# Patient Record
Sex: Male | Born: 1985 | Race: White | Hispanic: No | Marital: Single | Smoking: Current every day smoker
Health system: Southern US, Community
[De-identification: ages and names within clinical notes are randomized; demographics above are authoritative.]

## PROBLEM LIST (undated history)

## (undated) DIAGNOSIS — T50901A Poisoning by unspecified drugs, medicaments and biological substances, accidental (unintentional), initial encounter: Secondary | ICD-10-CM

## (undated) DIAGNOSIS — B1920 Unspecified viral hepatitis C without hepatic coma: Secondary | ICD-10-CM

## (undated) DIAGNOSIS — I82409 Acute embolism and thrombosis of unspecified deep veins of unspecified lower extremity: Secondary | ICD-10-CM

## (undated) HISTORY — PX: DENTAL SURGERY: SHX609

---

## 2007-06-02 ENCOUNTER — Emergency Department (HOSPITAL_COMMUNITY): Admission: EM | Admit: 2007-06-02 | Discharge: 2007-06-02 | Payer: Self-pay | Admitting: *Deleted

## 2007-12-25 ENCOUNTER — Emergency Department (HOSPITAL_COMMUNITY): Admission: EM | Admit: 2007-12-25 | Discharge: 2007-12-25 | Payer: Self-pay | Admitting: Emergency Medicine

## 2009-02-28 ENCOUNTER — Emergency Department (HOSPITAL_COMMUNITY): Admission: EM | Admit: 2009-02-28 | Discharge: 2009-03-01 | Payer: Self-pay | Admitting: Emergency Medicine

## 2010-10-19 ENCOUNTER — Emergency Department (HOSPITAL_COMMUNITY)
Admission: EM | Admit: 2010-10-19 | Discharge: 2010-10-19 | Payer: Self-pay | Source: Home / Self Care | Admitting: Emergency Medicine

## 2010-10-23 ENCOUNTER — Emergency Department (HOSPITAL_COMMUNITY)
Admission: EM | Admit: 2010-10-23 | Discharge: 2010-10-23 | Payer: Self-pay | Source: Home / Self Care | Admitting: Emergency Medicine

## 2010-10-23 ENCOUNTER — Emergency Department: Payer: Self-pay | Admitting: Emergency Medicine

## 2011-02-08 LAB — DIFFERENTIAL
Basophils Absolute: 0 10*3/uL (ref 0.0–0.1)
Basophils Relative: 0 % (ref 0–1)
Eosinophils Absolute: 0.2 10*3/uL (ref 0.0–0.7)
Eosinophils Relative: 2 % (ref 0–5)
Lymphocytes Relative: 31 % (ref 12–46)
Lymphs Abs: 2.5 10*3/uL (ref 0.7–4.0)
Monocytes Relative: 10 % (ref 3–12)
Myelocytes: 0 %
Neutro Abs: 4.7 10*3/uL (ref 1.7–7.7)
Neutrophils Relative %: 57 % (ref 43–77)
nRBC: 0 /100 WBC

## 2011-02-08 LAB — POCT I-STAT, CHEM 8
Calcium, Ion: 1.09 mmol/L — ABNORMAL LOW (ref 1.12–1.32)
Creatinine, Ser: 1.1 mg/dL (ref 0.4–1.5)
Glucose, Bld: 91 mg/dL (ref 70–99)
HCT: 48 % (ref 39.0–52.0)
Hemoglobin: 16.3 g/dL (ref 13.0–17.0)
Potassium: 3.7 mEq/L (ref 3.5–5.1)

## 2011-02-08 LAB — CBC
MCV: 91.9 fL (ref 78.0–100.0)
Platelets: 155 10*3/uL (ref 150–400)
RBC: 4.84 MIL/uL (ref 4.22–5.81)
WBC: 8.2 10*3/uL (ref 4.0–10.5)

## 2011-02-08 LAB — URINALYSIS, ROUTINE W REFLEX MICROSCOPIC
Hgb urine dipstick: NEGATIVE
Nitrite: NEGATIVE
Protein, ur: NEGATIVE mg/dL
Specific Gravity, Urine: 1.03 (ref 1.005–1.030)
Urobilinogen, UA: 1 mg/dL (ref 0.0–1.0)

## 2011-06-12 ENCOUNTER — Emergency Department (HOSPITAL_COMMUNITY): Payer: Self-pay

## 2011-06-12 ENCOUNTER — Emergency Department (HOSPITAL_COMMUNITY)
Admission: EM | Admit: 2011-06-12 | Discharge: 2011-06-12 | Disposition: A | Discharge status: Home / Self Care | Payer: Self-pay | Attending: Emergency Medicine | Admitting: Emergency Medicine

## 2011-06-12 DIAGNOSIS — S40029A Contusion of unspecified upper arm, initial encounter: Secondary | ICD-10-CM | POA: Insufficient documentation

## 2011-06-12 DIAGNOSIS — H53149 Visual discomfort, unspecified: Secondary | ICD-10-CM | POA: Insufficient documentation

## 2011-06-12 DIAGNOSIS — S40019A Contusion of unspecified shoulder, initial encounter: Secondary | ICD-10-CM | POA: Insufficient documentation

## 2011-06-12 DIAGNOSIS — IMO0002 Reserved for concepts with insufficient information to code with codable children: Secondary | ICD-10-CM | POA: Insufficient documentation

## 2011-06-12 DIAGNOSIS — S060XAA Concussion with loss of consciousness status unknown, initial encounter: Secondary | ICD-10-CM | POA: Insufficient documentation

## 2011-06-12 DIAGNOSIS — F17200 Nicotine dependence, unspecified, uncomplicated: Secondary | ICD-10-CM | POA: Insufficient documentation

## 2011-06-12 DIAGNOSIS — T07XXXA Unspecified multiple injuries, initial encounter: Secondary | ICD-10-CM

## 2011-06-12 DIAGNOSIS — S060X9A Concussion with loss of consciousness of unspecified duration, initial encounter: Secondary | ICD-10-CM

## 2011-06-12 MED ORDER — OXYCODONE-ACETAMINOPHEN 5-325 MG PO TABS: 2.0000 | ORAL_TABLET | ORAL | Status: AC | PRN

## 2011-06-12 NOTE — ED Notes (Signed)
MD at bedside. Dr Judd Lien discussed discharge with pt.
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
## 2011-07-05 ENCOUNTER — Emergency Department: Payer: Self-pay | Admitting: Emergency Medicine

## 2011-11-03 ENCOUNTER — Emergency Department: Payer: Self-pay | Admitting: Internal Medicine

## 2012-05-10 ENCOUNTER — Ambulatory Visit: Payer: Self-pay | Admitting: Family Medicine

## 2013-04-04 ENCOUNTER — Encounter (HOSPITAL_COMMUNITY): Payer: Self-pay | Admitting: *Deleted

## 2013-04-04 ENCOUNTER — Emergency Department (HOSPITAL_COMMUNITY)
Admission: EM | Admit: 2013-04-04 | Discharge: 2013-04-04 | Disposition: A | Discharge status: Home / Self Care | Payer: Self-pay | Attending: Emergency Medicine | Admitting: Emergency Medicine

## 2013-04-04 DIAGNOSIS — K0889 Other specified disorders of teeth and supporting structures: Secondary | ICD-10-CM

## 2013-04-04 DIAGNOSIS — F17200 Nicotine dependence, unspecified, uncomplicated: Secondary | ICD-10-CM | POA: Insufficient documentation

## 2013-04-04 DIAGNOSIS — K089 Disorder of teeth and supporting structures, unspecified: Secondary | ICD-10-CM | POA: Insufficient documentation

## 2013-04-04 DIAGNOSIS — K029 Dental caries, unspecified: Secondary | ICD-10-CM | POA: Insufficient documentation

## 2013-04-04 MED ORDER — AMOXICILLIN 500 MG PO CAPS: 500.0000 mg | ORAL_CAPSULE | Freq: Three times a day (TID) | ORAL | Status: DC

## 2013-04-04 MED ORDER — TRAMADOL HCL 50 MG PO TABS: 50.0000 mg | ORAL_TABLET | Freq: Four times a day (QID) | ORAL | Status: DC | PRN

## 2013-04-04 MED ORDER — TRAMADOL HCL 50 MG PO TABS
50.0000 mg | ORAL_TABLET | Freq: Once | ORAL | Status: AC
  Administered 2013-04-04: 50 mg via ORAL
  Filled 2013-04-04: qty 1

## 2013-04-04 MED ORDER — AMOXICILLIN 250 MG PO CAPS
500.0000 mg | ORAL_CAPSULE | Freq: Once | ORAL | Status: AC
  Administered 2013-04-04: 500 mg via ORAL
  Filled 2013-04-04: qty 2

## 2013-04-04 NOTE — ED Notes (Signed)
Pt alert & oriented x4, stable gait. Patient given discharge instructions, paperwork & prescription(s). Patient  instructed to stop at the registration desk to finish any additional paperwork. Patient verbalized understanding. Pt left department w/ no further questions. 

## 2013-04-04 NOTE — ED Provider Notes (Signed)
History     CSN: [id number]  Arrival date & time 04/04/13  2035   First MD Initiated Contact with Patient 04/04/13 2052      Chief Complaint  Patient presents with  . Dental Pain    (Consider location/radiation/quality/duration/timing/severity/associated sxs/prior treatment) HPI Comments: Edwin [name] is a 27 y.o. male who presents to the Emergency Department complaining of dental pain.  States he has a history of dental decay, but reports worsening pain to his right upper teeth for several days.  States he noticed a" bump" to his right upper gum earlier this week. He denies bleeding of the gums, difficulty swallowing or breathing, neck pain, facial swelling or fever. He states pain has been mildly controlled with ibuprofen  Patient is a 27 y.o. male presenting with tooth pain.  Dental Pain Associated symptoms: no congestion, no facial swelling, no fever, no headaches and no neck pain     History reviewed. No pertinent past medical history.  Past Surgical History  Procedure Laterality Date  . Dental surgery      No family history on file.  History  Substance Use Topics  . Smoking status: Current Every Day Smoker  . Smokeless tobacco: Not on file  . Alcohol Use: Yes      Review of Systems  Constitutional: Negative for fever and appetite change.  HENT: Positive for dental problem. Negative for congestion, sore throat, facial swelling, trouble swallowing, neck pain and neck stiffness.   Eyes: Negative for pain and visual disturbance.  Neurological: Negative for dizziness, facial asymmetry and headaches.  Hematological: Negative for adenopathy.  All other systems reviewed and are negative.    Allergies  Vicodin  Home Medications   Current Outpatient Rx  Name  Route  Sig  Dispense  Refill  . ibuprofen (ADVIL,MOTRIN) 200 MG tablet   Oral   Take 800 mg by mouth every 6 (six) hours as needed. pain            BP 141/88  Pulse 94  Temp(Src) 98.6 F (37 C)  (Oral)  Ht 5\' 8"  (1.727 m)  Wt 200 lb (90.719 kg)  BMI 30.42 kg/m2  SpO2 98%  Physical Exam  Nursing note and vitals reviewed. Constitutional: He is oriented to person, place, and time. He appears well-developed and well-nourished. No distress.  HENT:  Head: Normocephalic and atraumatic. No trismus in the jaw.  Right Ear: Tympanic membrane and ear canal normal.  Left Ear: Tympanic membrane and ear canal normal.  Mouth/Throat: Uvula is midline, oropharynx is clear and moist and mucous membranes are normal. Dental caries present. No dental abscesses or edematous.    Dental decay of the right upper bicuspid with a small erythematous papule to the surrounding gums. No drainage, facial edema, trismus, or sublingual abnormalities.  Neck: Normal range of motion. Neck supple.  Cardiovascular: Normal rate, regular rhythm and normal heart sounds.   No murmur heard. Pulmonary/Chest: Effort normal and breath sounds normal. No respiratory distress.  Musculoskeletal: Normal range of motion.  Lymphadenopathy:    He has no cervical adenopathy.  Neurological: He is alert and oriented to person, place, and time. He exhibits normal muscle tone. Coordination normal.  Skin: Skin is warm and dry.    ED Course  Procedures (including critical care time)  Labs Reviewed - No data to display No results found.      MDM    Vital signs are stable, patient is alert nontoxic appearing. Airway is patent. No clinical signs to  suggest Ludwig angina  Patient agrees to close followup with a dentist  Edwin [name] L. Trisha Mangle, PA-C 04/04/13 2106

## 2013-04-04 NOTE — ED Notes (Signed)
Pt states has a tooth on the upper right been hurting for a long time. Has become worse.

## 2013-04-05 NOTE — ED Provider Notes (Signed)
Medical screening examination/treatment/procedure(s) were performed by non-physician practitioner and as supervising physician I was immediately available for consultation/collaboration.    Iosefa Weintraub M Velinda Wrobel, DO 04/05/13 1452 

## 2014-01-12 ENCOUNTER — Emergency Department: Payer: Self-pay | Admitting: Emergency Medicine

## 2014-01-12 LAB — COMPREHENSIVE METABOLIC PANEL
ALK PHOS: 57 U/L
ALT: 40 U/L (ref 12–78)
ANION GAP: 7 (ref 7–16)
Albumin: 4.5 g/dL (ref 3.4–5.0)
BILIRUBIN TOTAL: 0.3 mg/dL (ref 0.2–1.0)
BUN: 18 mg/dL (ref 7–18)
CALCIUM: 8.4 mg/dL — AB (ref 8.5–10.1)
Chloride: 105 mmol/L (ref 98–107)
Co2: 25 mmol/L (ref 21–32)
Creatinine: 1.23 mg/dL (ref 0.60–1.30)
EGFR (Non-African Amer.): >60
Glucose: 139 mg/dL — ABNORMAL HIGH (ref 65–99)
OSMOLALITY: 278 (ref 275–301)
Potassium: 3.2 mmol/L — ABNORMAL LOW (ref 3.5–5.1)
SGOT(AST): 27 U/L (ref 15–37)
Sodium: 137 mmol/L (ref 136–145)
TOTAL PROTEIN: 8 g/dL (ref 6.4–8.2)

## 2014-01-12 LAB — URINALYSIS, COMPLETE
Bacteria: NONE SEEN
Bilirubin,UR: NEGATIVE
GLUCOSE, UR: NEGATIVE mg/dL (ref 0–75)
Ketone: NEGATIVE
Nitrite: NEGATIVE
PH: 5 (ref 4.5–8.0)
PROTEIN: NEGATIVE
Specific Gravity: 1.004 (ref 1.003–1.030)
Squamous Epithelial: NONE SEEN
WBC UR: 1 /HPF (ref 0–5)

## 2014-01-12 LAB — DRUG SCREEN, URINE
AMPHETAMINES, UR SCREEN: NEGATIVE (ref ?–1000)
BARBITURATES, UR SCREEN: NEGATIVE (ref ?–200)
Benzodiazepine, Ur Scrn: NEGATIVE (ref ?–200)
CANNABINOID 50 NG, UR ~~LOC~~: NEGATIVE (ref ?–50)
Cocaine Metabolite,Ur ~~LOC~~: POSITIVE (ref ?–300)
MDMA (Ecstasy)Ur Screen: NEGATIVE (ref ?–500)
METHADONE, UR SCREEN: NEGATIVE (ref ?–300)
OPIATE, UR SCREEN: POSITIVE (ref ?–300)
PHENCYCLIDINE (PCP) UR S: NEGATIVE (ref ?–25)
Tricyclic, Ur Screen: NEGATIVE (ref ?–1000)

## 2014-01-12 LAB — CBC
HCT: 43.9 % (ref 40.0–52.0)
HGB: 15.4 g/dL (ref 13.0–18.0)
MCH: 32.3 pg (ref 26.0–34.0)
MCHC: 35.2 g/dL (ref 32.0–36.0)
MCV: 92 fL (ref 80–100)
Platelet: 297 10*3/uL (ref 150–440)
RBC: 4.78 10*6/uL (ref 4.40–5.90)
RDW: 13.2 % (ref 11.5–14.5)
WBC: 16.5 10*3/uL — AB (ref 3.8–10.6)

## 2014-01-12 LAB — ETHANOL
ETHANOL LVL: 92 mg/dL
Ethanol %: 0.092 % — ABNORMAL HIGH (ref 0.000–0.080)

## 2014-01-21 ENCOUNTER — Other Ambulatory Visit: Payer: Self-pay

## 2014-01-21 ENCOUNTER — Inpatient Hospital Stay: Payer: Self-pay | Admitting: Internal Medicine

## 2014-01-21 LAB — TROPONIN I: Troponin-I: <0.02 ng/mL

## 2014-01-21 LAB — DRUG SCREEN, URINE
AMPHETAMINES, UR SCREEN: NEGATIVE (ref ?–1000)
BARBITURATES, UR SCREEN: NEGATIVE (ref ?–200)
Benzodiazepine, Ur Scrn: POSITIVE (ref ?–200)
COCAINE METABOLITE, UR ~~LOC~~: NEGATIVE (ref ?–300)
Cannabinoid 50 Ng, Ur ~~LOC~~: NEGATIVE (ref ?–50)
MDMA (Ecstasy)Ur Screen: NEGATIVE (ref ?–500)
METHADONE, UR SCREEN: NEGATIVE (ref ?–300)
Opiate, Ur Screen: POSITIVE (ref ?–300)
PHENCYCLIDINE (PCP) UR S: NEGATIVE (ref ?–25)
TRICYCLIC, UR SCREEN: NEGATIVE (ref ?–1000)

## 2014-01-21 LAB — COMPREHENSIVE METABOLIC PANEL
ALT: 29 U/L (ref 12–78)
AST: 22 U/L (ref 15–37)
Albumin: 3.8 g/dL (ref 3.4–5.0)
Alkaline Phosphatase: 55 U/L
Anion Gap: 15 (ref 7–16)
BUN: 19 mg/dL — AB (ref 7–18)
Bilirubin,Total: 0.2 mg/dL (ref 0.2–1.0)
CHLORIDE: 101 mmol/L (ref 98–107)
CREATININE: 1.54 mg/dL — AB (ref 0.60–1.30)
Calcium, Total: 7.9 mg/dL — ABNORMAL LOW (ref 8.5–10.1)
Co2: 19 mmol/L — ABNORMAL LOW (ref 21–32)
EGFR (African American): >60
Glucose: 343 mg/dL — ABNORMAL HIGH (ref 65–99)
Osmolality: 286 (ref 275–301)
Potassium: 3.6 mmol/L (ref 3.5–5.1)
SODIUM: 135 mmol/L — AB (ref 136–145)
Total Protein: 7 g/dL (ref 6.4–8.2)

## 2014-01-21 LAB — URINALYSIS, COMPLETE
BACTERIA: NONE SEEN
BILIRUBIN, UR: NEGATIVE
Blood: NEGATIVE
Glucose,UR: >=500 mg/dL (ref 0–75)
Ketone: NEGATIVE
Leukocyte Esterase: NEGATIVE
NITRITE: NEGATIVE
PROTEIN: NEGATIVE
Ph: 5 (ref 4.5–8.0)
Specific Gravity: 1.015 (ref 1.003–1.030)
Squamous Epithelial: NONE SEEN

## 2014-01-21 LAB — CBC
HCT: 41.8 % (ref 40.0–52.0)
HGB: 13.7 g/dL (ref 13.0–18.0)
MCH: 31.6 pg (ref 26.0–34.0)
MCHC: 32.9 g/dL (ref 32.0–36.0)
MCV: 96 fL (ref 80–100)
PLATELETS: 266 10*3/uL (ref 150–440)
RBC: 4.35 10*6/uL — ABNORMAL LOW (ref 4.40–5.90)
RDW: 13.4 % (ref 11.5–14.5)
WBC: 17 10*3/uL — ABNORMAL HIGH (ref 3.8–10.6)

## 2014-01-21 LAB — BASIC METABOLIC PANEL
ANION GAP: 6 — AB (ref 7–16)
BUN: 19 mg/dL — AB (ref 7–18)
CALCIUM: 8 mg/dL — AB (ref 8.5–10.1)
CREATININE: 1.09 mg/dL (ref 0.60–1.30)
Chloride: 106 mmol/L (ref 98–107)
Co2: 26 mmol/L (ref 21–32)
GLUCOSE: 83 mg/dL (ref 65–99)
Osmolality: 277 (ref 275–301)
Potassium: 4.5 mmol/L (ref 3.5–5.1)
SODIUM: 138 mmol/L (ref 136–145)

## 2014-01-21 LAB — ETHANOL
Ethanol %: <0.003 % (ref 0.000–0.080)
Ethanol: <3 mg/dL

## 2014-01-21 LAB — MAGNESIUM: Magnesium: 1.9 mg/dL

## 2014-01-21 LAB — HEMOGLOBIN A1C: Hemoglobin A1C: 5.8 % (ref 4.2–6.3)

## 2014-01-22 LAB — CBC WITH DIFFERENTIAL/PLATELET
Basophil #: 0.1 10*3/uL (ref 0.0–0.1)
Basophil %: 0.4 %
Eosinophil #: 0.3 10*3/uL (ref 0.0–0.7)
Eosinophil %: 2.1 %
HCT: 35.6 % — ABNORMAL LOW (ref 40.0–52.0)
HGB: 12 g/dL — AB (ref 13.0–18.0)
LYMPHS ABS: 2.8 10*3/uL (ref 1.0–3.6)
LYMPHS PCT: 18.3 %
MCH: 31.7 pg (ref 26.0–34.0)
MCHC: 33.7 g/dL (ref 32.0–36.0)
MCV: 94 fL (ref 80–100)
MONO ABS: 1.5 x10 3/mm — AB (ref 0.2–1.0)
Monocyte %: 9.6 %
NEUTROS PCT: 69.6 %
Neutrophil #: 10.6 10*3/uL — ABNORMAL HIGH (ref 1.4–6.5)
PLATELETS: 162 10*3/uL (ref 150–440)
RBC: 3.79 10*6/uL — AB (ref 4.40–5.90)
RDW: 13.5 % (ref 11.5–14.5)
WBC: 15.3 10*3/uL — AB (ref 3.8–10.6)

## 2014-01-22 LAB — COMPREHENSIVE METABOLIC PANEL
ALK PHOS: 40 U/L — AB
ALT: 24 U/L (ref 12–78)
Albumin: 2.7 g/dL — ABNORMAL LOW (ref 3.4–5.0)
Anion Gap: 7 (ref 7–16)
BILIRUBIN TOTAL: 0.4 mg/dL (ref 0.2–1.0)
BUN: 27 mg/dL — ABNORMAL HIGH (ref 7–18)
CALCIUM: 7.5 mg/dL — AB (ref 8.5–10.1)
Chloride: 104 mmol/L (ref 98–107)
Co2: 25 mmol/L (ref 21–32)
Creatinine: 2 mg/dL — ABNORMAL HIGH (ref 0.60–1.30)
EGFR (African American): 51 — ABNORMAL LOW
GFR CALC NON AF AMER: 44 — AB
GLUCOSE: 99 mg/dL (ref 65–99)
Osmolality: 277 (ref 275–301)
POTASSIUM: 4.7 mmol/L (ref 3.5–5.1)
SGOT(AST): 39 U/L — ABNORMAL HIGH (ref 15–37)
SODIUM: 136 mmol/L (ref 136–145)
TOTAL PROTEIN: 5.8 g/dL — AB (ref 6.4–8.2)

## 2014-01-22 LAB — MAGNESIUM: MAGNESIUM: 1.8 mg/dL

## 2014-01-23 LAB — BASIC METABOLIC PANEL
ANION GAP: 6 — AB (ref 7–16)
BUN: 11 mg/dL (ref 7–18)
CALCIUM: 7.9 mg/dL — AB (ref 8.5–10.1)
Chloride: 108 mmol/L — ABNORMAL HIGH (ref 98–107)
Co2: 25 mmol/L (ref 21–32)
Creatinine: 1.06 mg/dL (ref 0.60–1.30)
EGFR (African American): >60
EGFR (Non-African Amer.): >60
Glucose: 87 mg/dL (ref 65–99)
OSMOLALITY: 276 (ref 275–301)
POTASSIUM: 3.5 mmol/L (ref 3.5–5.1)
SODIUM: 139 mmol/L (ref 136–145)

## 2014-01-23 LAB — PHOSPHORUS: PHOSPHORUS: 2.5 mg/dL (ref 2.5–4.9)

## 2014-01-24 LAB — TRIGLYCERIDES: TRIGLYCERIDES: 443 mg/dL — AB (ref 0–200)

## 2014-01-25 LAB — BASIC METABOLIC PANEL
Anion Gap: 3 — ABNORMAL LOW (ref 7–16)
BUN: 10 mg/dL (ref 7–18)
CALCIUM: 8 mg/dL — AB (ref 8.5–10.1)
CO2: 29 mmol/L (ref 21–32)
Chloride: 111 mmol/L — ABNORMAL HIGH (ref 98–107)
Creatinine: 0.9 mg/dL (ref 0.60–1.30)
EGFR (African American): >60
EGFR (Non-African Amer.): >60
GLUCOSE: 89 mg/dL (ref 65–99)
OSMOLALITY: 283 (ref 275–301)
POTASSIUM: 3.6 mmol/L (ref 3.5–5.1)
SODIUM: 143 mmol/L (ref 136–145)

## 2014-01-25 LAB — BRONCHIAL WASH CULTURE

## 2014-01-25 LAB — AMMONIA: Ammonia, Plasma: 32 mcmol/L (ref 11–32)

## 2014-01-25 LAB — MAGNESIUM: Magnesium: 1.9 mg/dL

## 2014-01-25 LAB — PHOSPHORUS: PHOSPHORUS: 3.3 mg/dL (ref 2.5–4.9)

## 2014-01-26 LAB — COMPREHENSIVE METABOLIC PANEL
ALT: 42 U/L (ref 12–78)
ANION GAP: 7 (ref 7–16)
AST: 41 U/L — AB (ref 15–37)
Albumin: 1.9 g/dL — ABNORMAL LOW (ref 3.4–5.0)
Alkaline Phosphatase: 85 U/L
BUN: 12 mg/dL (ref 7–18)
Bilirubin,Total: 0.4 mg/dL (ref 0.2–1.0)
CO2: 26 mmol/L (ref 21–32)
Calcium, Total: 7.8 mg/dL — ABNORMAL LOW (ref 8.5–10.1)
Chloride: 111 mmol/L — ABNORMAL HIGH (ref 98–107)
Creatinine: 0.97 mg/dL (ref 0.60–1.30)
EGFR (Non-African Amer.): >60
Glucose: 109 mg/dL — ABNORMAL HIGH (ref 65–99)
OSMOLALITY: 287 (ref 275–301)
Potassium: 3.4 mmol/L — ABNORMAL LOW (ref 3.5–5.1)
Sodium: 144 mmol/L (ref 136–145)
TOTAL PROTEIN: 5.9 g/dL — AB (ref 6.4–8.2)

## 2014-01-26 LAB — CBC WITH DIFFERENTIAL/PLATELET
Basophil #: 0.1 10*3/uL (ref 0.0–0.1)
Basophil %: 0.7 %
Eosinophil #: 0.5 10*3/uL (ref 0.0–0.7)
Eosinophil %: 6.7 %
HCT: 28 % — AB (ref 40.0–52.0)
HGB: 9.5 g/dL — ABNORMAL LOW (ref 13.0–18.0)
Lymphocyte #: 1.7 10*3/uL (ref 1.0–3.6)
Lymphocyte %: 23 %
MCH: 31.4 pg (ref 26.0–34.0)
MCHC: 33.9 g/dL (ref 32.0–36.0)
MCV: 93 fL (ref 80–100)
Monocyte #: 1.2 x10 3/mm — ABNORMAL HIGH (ref 0.2–1.0)
Monocyte %: 15.7 %
Neutrophil #: 4 10*3/uL (ref 1.4–6.5)
Neutrophil %: 53.9 %
PLATELETS: 191 10*3/uL (ref 150–440)
RBC: 3.03 10*6/uL — AB (ref 4.40–5.90)
RDW: 13.6 % (ref 11.5–14.5)
WBC: 7.4 10*3/uL (ref 3.8–10.6)

## 2014-01-26 LAB — VANCOMYCIN, TROUGH: Vancomycin, Trough: 16 ug/mL (ref 10–20)

## 2014-01-26 LAB — PHOSPHORUS: Phosphorus: 4.4 mg/dL (ref 2.5–4.9)

## 2014-01-26 LAB — CULTURE, BLOOD (SINGLE)

## 2014-01-26 LAB — POTASSIUM: Potassium: 3.9 mmol/L (ref 3.5–5.1)

## 2014-01-26 LAB — MAGNESIUM: Magnesium: 1.8 mg/dL

## 2014-01-27 LAB — BASIC METABOLIC PANEL
Anion Gap: 3 — ABNORMAL LOW (ref 7–16)
BUN: 13 mg/dL (ref 7–18)
CREATININE: 0.95 mg/dL (ref 0.60–1.30)
Calcium, Total: 7.9 mg/dL — ABNORMAL LOW (ref 8.5–10.1)
Chloride: 109 mmol/L — ABNORMAL HIGH (ref 98–107)
Co2: 30 mmol/L (ref 21–32)
EGFR (Non-African Amer.): >60
Glucose: 105 mg/dL — ABNORMAL HIGH (ref 65–99)
OSMOLALITY: 284 (ref 275–301)
POTASSIUM: 3.8 mmol/L (ref 3.5–5.1)
SODIUM: 142 mmol/L (ref 136–145)

## 2014-01-27 LAB — MAGNESIUM: MAGNESIUM: 1.7 mg/dL — AB

## 2014-01-27 LAB — PHOSPHORUS: PHOSPHORUS: 4.3 mg/dL (ref 2.5–4.9)

## 2014-01-28 LAB — BASIC METABOLIC PANEL
Anion Gap: 1 — ABNORMAL LOW (ref 7–16)
BUN: 17 mg/dL (ref 7–18)
CO2: 32 mmol/L (ref 21–32)
Calcium, Total: 8 mg/dL — ABNORMAL LOW (ref 8.5–10.1)
Chloride: 107 mmol/L (ref 98–107)
Creatinine: 0.79 mg/dL (ref 0.60–1.30)
EGFR (Non-African Amer.): >60
GLUCOSE: 145 mg/dL — AB (ref 65–99)
Osmolality: 284 (ref 275–301)
POTASSIUM: 4.4 mmol/L (ref 3.5–5.1)
Sodium: 140 mmol/L (ref 136–145)

## 2014-01-28 LAB — MAGNESIUM: Magnesium: 2.1 mg/dL

## 2014-01-28 LAB — PHOSPHORUS: Phosphorus: 3.5 mg/dL (ref 2.5–4.9)

## 2014-01-28 LAB — TRIGLYCERIDES: Triglycerides: 186 mg/dL (ref 0–200)

## 2014-01-29 LAB — PHOSPHORUS: Phosphorus: 2.9 mg/dL (ref 2.5–4.9)

## 2014-01-29 LAB — CBC WITH DIFFERENTIAL/PLATELET
BASOS ABS: 0.1 10*3/uL (ref 0.0–0.1)
Basophil %: 0.5 %
EOS PCT: 0.2 %
Eosinophil #: 0 10*3/uL (ref 0.0–0.7)
HCT: 29.9 % — ABNORMAL LOW (ref 40.0–52.0)
HGB: 10 g/dL — AB (ref 13.0–18.0)
LYMPHS ABS: 1.9 10*3/uL (ref 1.0–3.6)
LYMPHS PCT: 16.5 %
MCH: 30.8 pg (ref 26.0–34.0)
MCHC: 33.3 g/dL (ref 32.0–36.0)
MCV: 92 fL (ref 80–100)
MONO ABS: 1.6 x10 3/mm — AB (ref 0.2–1.0)
MONOS PCT: 13.9 %
Neutrophil #: 7.8 10*3/uL — ABNORMAL HIGH (ref 1.4–6.5)
Neutrophil %: 68.9 %
PLATELETS: 410 10*3/uL (ref 150–440)
RBC: 3.23 10*6/uL — ABNORMAL LOW (ref 4.40–5.90)
RDW: 13.5 % (ref 11.5–14.5)
WBC: 11.3 10*3/uL — AB (ref 3.8–10.6)

## 2014-01-29 LAB — BASIC METABOLIC PANEL
Anion Gap: 2 — ABNORMAL LOW (ref 7–16)
BUN: 24 mg/dL — ABNORMAL HIGH (ref 7–18)
CALCIUM: 8.1 mg/dL — AB (ref 8.5–10.1)
Chloride: 107 mmol/L (ref 98–107)
Co2: 32 mmol/L (ref 21–32)
Creatinine: 0.73 mg/dL (ref 0.60–1.30)
EGFR (African American): >60
GLUCOSE: 147 mg/dL — AB (ref 65–99)
Osmolality: 288 (ref 275–301)
Potassium: 4.4 mmol/L (ref 3.5–5.1)
Sodium: 141 mmol/L (ref 136–145)

## 2014-01-29 LAB — MAGNESIUM: Magnesium: 2.1 mg/dL

## 2014-01-30 LAB — CBC WITH DIFFERENTIAL/PLATELET
BASOS PCT: 1 %
Basophil #: 0.1 10*3/uL (ref 0.0–0.1)
EOS ABS: 0.1 10*3/uL (ref 0.0–0.7)
EOS PCT: 0.6 %
HCT: 32.5 % — ABNORMAL LOW (ref 40.0–52.0)
HGB: 10.7 g/dL — AB (ref 13.0–18.0)
LYMPHS PCT: 21.5 %
Lymphocyte #: 2.9 10*3/uL (ref 1.0–3.6)
MCH: 30.7 pg (ref 26.0–34.0)
MCHC: 32.9 g/dL (ref 32.0–36.0)
MCV: 93 fL (ref 80–100)
Monocyte #: 1.9 x10 3/mm — ABNORMAL HIGH (ref 0.2–1.0)
Monocyte %: 13.8 %
NEUTROS PCT: 63.1 %
Neutrophil #: 8.6 10*3/uL — ABNORMAL HIGH (ref 1.4–6.5)
PLATELETS: 438 10*3/uL (ref 150–440)
RBC: 3.48 10*6/uL — AB (ref 4.40–5.90)
RDW: 13.6 % (ref 11.5–14.5)
WBC: 13.6 10*3/uL — ABNORMAL HIGH (ref 3.8–10.6)

## 2014-01-30 LAB — BASIC METABOLIC PANEL
Anion Gap: 4 — ABNORMAL LOW (ref 7–16)
BUN: 30 mg/dL — AB (ref 7–18)
CALCIUM: 8.4 mg/dL — AB (ref 8.5–10.1)
CO2: 31 mmol/L (ref 21–32)
Chloride: 104 mmol/L (ref 98–107)
Creatinine: 0.81 mg/dL (ref 0.60–1.30)
GLUCOSE: 110 mg/dL — AB (ref 65–99)
Osmolality: 284 (ref 275–301)
POTASSIUM: 3.7 mmol/L (ref 3.5–5.1)
Sodium: 139 mmol/L (ref 136–145)

## 2014-01-30 LAB — URINALYSIS, COMPLETE
BILIRUBIN, UR: NEGATIVE
Bacteria: NONE SEEN
Blood: NEGATIVE
Glucose,UR: NEGATIVE mg/dL (ref 0–75)
Ketone: NEGATIVE
Leukocyte Esterase: NEGATIVE
NITRITE: NEGATIVE
PH: 7 (ref 4.5–8.0)
Protein: NEGATIVE
SQUAMOUS EPITHELIAL: NONE SEEN
Specific Gravity: 1.015 (ref 1.003–1.030)
WBC UR: NONE SEEN /HPF (ref 0–5)

## 2014-01-30 LAB — PHOSPHORUS: Phosphorus: 3.2 mg/dL (ref 2.5–4.9)

## 2014-01-30 LAB — MAGNESIUM: Magnesium: 2.1 mg/dL

## 2014-01-31 LAB — BASIC METABOLIC PANEL
Anion Gap: 5 — ABNORMAL LOW (ref 7–16)
BUN: 28 mg/dL — AB (ref 7–18)
Calcium, Total: 8 mg/dL — ABNORMAL LOW (ref 8.5–10.1)
Chloride: 105 mmol/L (ref 98–107)
Co2: 31 mmol/L (ref 21–32)
Creatinine: 0.87 mg/dL (ref 0.60–1.30)
EGFR (Non-African Amer.): >60
Glucose: 93 mg/dL (ref 65–99)
OSMOLALITY: 286 (ref 275–301)
Potassium: 3 mmol/L — ABNORMAL LOW (ref 3.5–5.1)
Sodium: 141 mmol/L (ref 136–145)

## 2014-01-31 LAB — CBC WITH DIFFERENTIAL/PLATELET
BASOS PCT: 1.1 %
Basophil #: 0.2 10*3/uL — ABNORMAL HIGH (ref 0.0–0.1)
Eosinophil #: 0.2 10*3/uL (ref 0.0–0.7)
Eosinophil %: 0.9 %
HCT: 31.3 % — AB (ref 40.0–52.0)
HGB: 10.4 g/dL — AB (ref 13.0–18.0)
Lymphocyte #: 3.1 10*3/uL (ref 1.0–3.6)
Lymphocyte %: 18.2 %
MCH: 30.6 pg (ref 26.0–34.0)
MCHC: 33.2 g/dL (ref 32.0–36.0)
MCV: 92 fL (ref 80–100)
Monocyte #: 1.7 x10 3/mm — ABNORMAL HIGH (ref 0.2–1.0)
Monocyte %: 10.2 %
Neutrophil #: 11.9 10*3/uL — ABNORMAL HIGH (ref 1.4–6.5)
Neutrophil %: 69.6 %
Platelet: 465 10*3/uL — ABNORMAL HIGH (ref 150–440)
RBC: 3.4 10*6/uL — ABNORMAL LOW (ref 4.40–5.90)
RDW: 13.3 % (ref 11.5–14.5)
WBC: 17.2 10*3/uL — AB (ref 3.8–10.6)

## 2014-01-31 LAB — TRIGLYCERIDES: Triglycerides: 166 mg/dL (ref 0–200)

## 2014-01-31 LAB — PHOSPHORUS: Phosphorus: 3.6 mg/dL (ref 2.5–4.9)

## 2014-01-31 LAB — MAGNESIUM: Magnesium: 1.9 mg/dL

## 2014-01-31 LAB — CLOSTRIDIUM DIFFICILE(ARMC)

## 2014-02-01 LAB — BASIC METABOLIC PANEL
ANION GAP: 3 — AB (ref 7–16)
BUN: 26 mg/dL — AB (ref 7–18)
CHLORIDE: 105 mmol/L (ref 98–107)
CREATININE: 0.78 mg/dL (ref 0.60–1.30)
Calcium, Total: 8 mg/dL — ABNORMAL LOW (ref 8.5–10.1)
Co2: 33 mmol/L — ABNORMAL HIGH (ref 21–32)
EGFR (Non-African Amer.): >60
GLUCOSE: 96 mg/dL (ref 65–99)
Osmolality: 286 (ref 275–301)
POTASSIUM: 3.4 mmol/L — AB (ref 3.5–5.1)
SODIUM: 141 mmol/L (ref 136–145)

## 2014-02-01 LAB — CBC WITH DIFFERENTIAL/PLATELET
Basophil #: 0.2 10*3/uL — ABNORMAL HIGH (ref 0.0–0.1)
Basophil %: 1 %
EOS PCT: 2.3 %
Eosinophil #: 0.4 10*3/uL (ref 0.0–0.7)
HCT: 31 % — ABNORMAL LOW (ref 40.0–52.0)
HGB: 10.4 g/dL — ABNORMAL LOW (ref 13.0–18.0)
Lymphocyte #: 2.8 10*3/uL (ref 1.0–3.6)
Lymphocyte %: 17.6 %
MCH: 31.1 pg (ref 26.0–34.0)
MCHC: 33.7 g/dL (ref 32.0–36.0)
MCV: 92 fL (ref 80–100)
MONO ABS: 1.6 x10 3/mm — AB (ref 0.2–1.0)
Monocyte %: 10.4 %
NEUTROS PCT: 68.7 %
Neutrophil #: 10.9 10*3/uL — ABNORMAL HIGH (ref 1.4–6.5)
PLATELETS: 493 10*3/uL — AB (ref 150–440)
RBC: 3.36 10*6/uL — ABNORMAL LOW (ref 4.40–5.90)
RDW: 13.4 % (ref 11.5–14.5)
WBC: 15.8 10*3/uL — ABNORMAL HIGH (ref 3.8–10.6)

## 2014-02-01 LAB — URINE CULTURE

## 2014-02-01 LAB — PHOSPHORUS: PHOSPHORUS: 4.6 mg/dL (ref 2.5–4.9)

## 2014-02-01 LAB — ALBUMIN: Albumin: 2.4 g/dL — ABNORMAL LOW (ref 3.4–5.0)

## 2014-02-01 LAB — MAGNESIUM: Magnesium: 2 mg/dL

## 2014-02-02 LAB — BASIC METABOLIC PANEL
Anion Gap: 4 — ABNORMAL LOW (ref 7–16)
BUN: 26 mg/dL — ABNORMAL HIGH (ref 7–18)
CHLORIDE: 106 mmol/L (ref 98–107)
Calcium, Total: 7.8 mg/dL — ABNORMAL LOW (ref 8.5–10.1)
Co2: 31 mmol/L (ref 21–32)
Creatinine: 0.98 mg/dL (ref 0.60–1.30)
EGFR (African American): >60
EGFR (Non-African Amer.): >60
Glucose: 97 mg/dL (ref 65–99)
OSMOLALITY: 286 (ref 275–301)
Potassium: 3.3 mmol/L — ABNORMAL LOW (ref 3.5–5.1)
SODIUM: 141 mmol/L (ref 136–145)

## 2014-02-02 LAB — PHOSPHORUS: Phosphorus: 4 mg/dL (ref 2.5–4.9)

## 2014-02-02 LAB — MAGNESIUM: Magnesium: 1.7 mg/dL — ABNORMAL LOW

## 2014-02-03 LAB — BASIC METABOLIC PANEL
Anion Gap: 3 — ABNORMAL LOW (ref 7–16)
BUN: 24 mg/dL — AB (ref 7–18)
CHLORIDE: 105 mmol/L (ref 98–107)
CO2: 30 mmol/L (ref 21–32)
CREATININE: 0.85 mg/dL (ref 0.60–1.30)
Calcium, Total: 8.1 mg/dL — ABNORMAL LOW (ref 8.5–10.1)
EGFR (Non-African Amer.): >60
GLUCOSE: 100 mg/dL — AB (ref 65–99)
Osmolality: 280 (ref 275–301)
POTASSIUM: 4.1 mmol/L (ref 3.5–5.1)
Sodium: 138 mmol/L (ref 136–145)

## 2014-02-03 LAB — MAGNESIUM: MAGNESIUM: 1.8 mg/dL

## 2014-02-03 LAB — PHOSPHORUS: Phosphorus: 4.2 mg/dL (ref 2.5–4.9)

## 2014-02-04 DIAGNOSIS — I369 Nonrheumatic tricuspid valve disorder, unspecified: Secondary | ICD-10-CM

## 2014-02-04 LAB — BASIC METABOLIC PANEL
Anion Gap: 3 — ABNORMAL LOW (ref 7–16)
BUN: 22 mg/dL — ABNORMAL HIGH (ref 7–18)
CHLORIDE: 105 mmol/L (ref 98–107)
Calcium, Total: 8.1 mg/dL — ABNORMAL LOW (ref 8.5–10.1)
Co2: 31 mmol/L (ref 21–32)
Creatinine: 0.8 mg/dL (ref 0.60–1.30)
EGFR (African American): >60
Glucose: 111 mg/dL — ABNORMAL HIGH (ref 65–99)
OSMOLALITY: 282 (ref 275–301)
POTASSIUM: 4 mmol/L (ref 3.5–5.1)
SODIUM: 139 mmol/L (ref 136–145)

## 2014-02-04 LAB — MAGNESIUM
MAGNESIUM: 1.6 mg/dL — AB
Magnesium: 2.1 mg/dL

## 2014-02-04 LAB — PHOSPHORUS: Phosphorus: 4.4 mg/dL (ref 2.5–4.9)

## 2014-02-04 LAB — TRIGLYCERIDES: TRIGLYCERIDES: 114 mg/dL (ref 0–200)

## 2014-02-05 LAB — BASIC METABOLIC PANEL
ANION GAP: 5 — AB (ref 7–16)
BUN: 24 mg/dL — ABNORMAL HIGH (ref 7–18)
CHLORIDE: 101 mmol/L (ref 98–107)
CO2: 31 mmol/L (ref 21–32)
Calcium, Total: 8.3 mg/dL — ABNORMAL LOW (ref 8.5–10.1)
Creatinine: 0.76 mg/dL (ref 0.60–1.30)
Glucose: 99 mg/dL (ref 65–99)
OSMOLALITY: 278 (ref 275–301)
Potassium: 4 mmol/L (ref 3.5–5.1)
SODIUM: 137 mmol/L (ref 136–145)

## 2014-02-05 LAB — CULTURE, BLOOD (SINGLE)

## 2014-02-05 LAB — RAPID HIV-1/2 QL/CONFIRM: HIV-1/2,Rapid Ql: NEGATIVE

## 2014-02-05 LAB — MAGNESIUM: MAGNESIUM: 1.7 mg/dL — AB

## 2014-02-06 LAB — BASIC METABOLIC PANEL
ANION GAP: 0 — AB (ref 7–16)
BUN: 27 mg/dL — AB (ref 7–18)
CHLORIDE: 99 mmol/L (ref 98–107)
Calcium, Total: 8.3 mg/dL — ABNORMAL LOW (ref 8.5–10.1)
Co2: 34 mmol/L — ABNORMAL HIGH (ref 21–32)
Creatinine: 0.75 mg/dL (ref 0.60–1.30)
EGFR (African American): >60
Glucose: 107 mg/dL — ABNORMAL HIGH (ref 65–99)
Osmolality: 272 (ref 275–301)
POTASSIUM: 4 mmol/L (ref 3.5–5.1)
Sodium: 133 mmol/L — ABNORMAL LOW (ref 136–145)

## 2014-02-06 LAB — PHOSPHORUS: PHOSPHORUS: 5.2 mg/dL — AB (ref 2.5–4.9)

## 2014-02-06 LAB — MAGNESIUM: Magnesium: 1.8 mg/dL

## 2014-02-07 LAB — CBC WITH DIFFERENTIAL/PLATELET
Basophil #: 0.1 10*3/uL (ref 0.0–0.1)
Basophil %: 0.5 %
EOS PCT: 2.6 %
Eosinophil #: 0.4 10*3/uL (ref 0.0–0.7)
HCT: 26.4 % — ABNORMAL LOW (ref 40.0–52.0)
HGB: 8.9 g/dL — AB (ref 13.0–18.0)
Lymphocyte #: 1.7 10*3/uL (ref 1.0–3.6)
Lymphocyte %: 10.6 %
MCH: 30.6 pg (ref 26.0–34.0)
MCHC: 33.7 g/dL (ref 32.0–36.0)
MCV: 91 fL (ref 80–100)
Monocyte #: 3.2 x10 3/mm — ABNORMAL HIGH (ref 0.2–1.0)
Monocyte %: 19.8 %
NEUTROS ABS: 10.6 10*3/uL — AB (ref 1.4–6.5)
Neutrophil %: 66.5 %
Platelet: 374 10*3/uL (ref 150–440)
RBC: 2.91 10*6/uL — AB (ref 4.40–5.90)
RDW: 14 % (ref 11.5–14.5)
WBC: 16 10*3/uL — ABNORMAL HIGH (ref 3.8–10.6)

## 2014-02-07 LAB — BASIC METABOLIC PANEL
Anion Gap: 6 — ABNORMAL LOW (ref 7–16)
BUN: 21 mg/dL — AB (ref 7–18)
CALCIUM: 8.2 mg/dL — AB (ref 8.5–10.1)
CO2: 29 mmol/L (ref 21–32)
Chloride: 100 mmol/L (ref 98–107)
Creatinine: 0.8 mg/dL (ref 0.60–1.30)
EGFR (Non-African Amer.): >60
Glucose: 103 mg/dL — ABNORMAL HIGH (ref 65–99)
Osmolality: 273 (ref 275–301)
Potassium: 3.9 mmol/L (ref 3.5–5.1)
SODIUM: 135 mmol/L — AB (ref 136–145)

## 2014-02-07 LAB — TRIGLYCERIDES: Triglycerides: 101 mg/dL (ref 0–200)

## 2014-02-07 LAB — PHOSPHORUS: PHOSPHORUS: 4.3 mg/dL (ref 2.5–4.9)

## 2014-02-07 LAB — MAGNESIUM: MAGNESIUM: 1.6 mg/dL — AB

## 2014-02-07 LAB — VANCOMYCIN, TROUGH: Vancomycin, Trough: 13 ug/mL (ref 10–20)

## 2014-02-08 LAB — BASIC METABOLIC PANEL
Anion Gap: 5 — ABNORMAL LOW (ref 7–16)
BUN: 16 mg/dL (ref 7–18)
CHLORIDE: 98 mmol/L (ref 98–107)
Calcium, Total: 8.1 mg/dL — ABNORMAL LOW (ref 8.5–10.1)
Co2: 31 mmol/L (ref 21–32)
Creatinine: 0.73 mg/dL (ref 0.60–1.30)
EGFR (Non-African Amer.): >60
GLUCOSE: 90 mg/dL (ref 65–99)
Osmolality: 269 (ref 275–301)
Potassium: 3.4 mmol/L — ABNORMAL LOW (ref 3.5–5.1)
SODIUM: 134 mmol/L — AB (ref 136–145)

## 2014-02-08 LAB — MAGNESIUM
MAGNESIUM: 1.7 mg/dL — AB
Magnesium: 2 mg/dL

## 2014-02-08 LAB — VANCOMYCIN, TROUGH: Vancomycin, Trough: 14 ug/mL (ref 10–20)

## 2014-02-08 LAB — URINALYSIS, COMPLETE
Bilirubin,UR: NEGATIVE
Blood: NEGATIVE
Glucose,UR: NEGATIVE mg/dL (ref 0–75)
Ketone: NEGATIVE
Leukocyte Esterase: NEGATIVE
Nitrite: NEGATIVE
Ph: 5 (ref 4.5–8.0)
Protein: NEGATIVE
RBC,UR: 5 /HPF (ref 0–5)
Specific Gravity: 1.026 (ref 1.003–1.030)
Squamous Epithelial: NONE SEEN

## 2014-02-08 LAB — PHOSPHORUS: Phosphorus: 4.2 mg/dL (ref 2.5–4.9)

## 2014-02-08 LAB — CLOSTRIDIUM DIFFICILE(ARMC)

## 2014-02-08 LAB — POTASSIUM: Potassium: 3.8 mmol/L (ref 3.5–5.1)

## 2014-02-09 LAB — BASIC METABOLIC PANEL
Anion Gap: 4 — ABNORMAL LOW (ref 7–16)
BUN: 13 mg/dL (ref 7–18)
CALCIUM: 7.9 mg/dL — AB (ref 8.5–10.1)
CHLORIDE: 102 mmol/L (ref 98–107)
CO2: 30 mmol/L (ref 21–32)
Creatinine: 0.6 mg/dL (ref 0.60–1.30)
EGFR (African American): >60
EGFR (Non-African Amer.): >60
GLUCOSE: 107 mg/dL — AB (ref 65–99)
OSMOLALITY: 273 (ref 275–301)
POTASSIUM: 3.3 mmol/L — AB (ref 3.5–5.1)
Sodium: 136 mmol/L (ref 136–145)

## 2014-02-09 LAB — HEPATIC FUNCTION PANEL A (ARMC)
ALK PHOS: 66 U/L
Albumin: 2.1 g/dL — ABNORMAL LOW (ref 3.4–5.0)
BILIRUBIN DIRECT: 0.2 mg/dL (ref 0.00–0.20)
BILIRUBIN TOTAL: 0.4 mg/dL (ref 0.2–1.0)
SGOT(AST): 23 U/L (ref 15–37)
SGPT (ALT): 44 U/L (ref 12–78)
Total Protein: 6.5 g/dL (ref 6.4–8.2)

## 2014-02-09 LAB — CBC WITH DIFFERENTIAL/PLATELET
BASOS PCT: 0.8 %
Basophil #: 0.1 10*3/uL (ref 0.0–0.1)
Basophil: 2 %
Eosinophil #: 0.6 10*3/uL (ref 0.0–0.7)
Eosinophil %: 4.3 %
Eosinophil: 5 %
HCT: 25.3 % — AB (ref 40.0–52.0)
HGB: 7.8 g/dL — AB (ref 13.0–18.0)
LYMPHS PCT: 12.3 %
Lymphocyte #: 1.7 10*3/uL (ref 1.0–3.6)
Lymphocytes: 12 %
MCH: 28 pg (ref 26.0–34.0)
MCHC: 30.9 g/dL — ABNORMAL LOW (ref 32.0–36.0)
MCV: 91 fL (ref 80–100)
Monocyte #: 2.2 x10 3/mm — ABNORMAL HIGH (ref 0.2–1.0)
Monocyte %: 15.6 %
Monocytes: 9 %
NEUTROS ABS: 9.5 10*3/uL — AB (ref 1.4–6.5)
Neutrophil %: 67 %
Platelet: 409 10*3/uL (ref 150–440)
RBC: 2.78 10*6/uL — ABNORMAL LOW (ref 4.40–5.90)
RDW: 14 % (ref 11.5–14.5)
Segmented Neutrophils: 72 %
WBC: 14.1 10*3/uL — ABNORMAL HIGH (ref 3.8–10.6)

## 2014-02-09 LAB — POTASSIUM: POTASSIUM: 3.8 mmol/L (ref 3.5–5.1)

## 2014-02-10 LAB — POTASSIUM: POTASSIUM: 3.9 mmol/L (ref 3.5–5.1)

## 2014-02-10 LAB — BASIC METABOLIC PANEL
ANION GAP: 8 (ref 7–16)
BUN: 16 mg/dL (ref 7–18)
CALCIUM: 8.3 mg/dL — AB (ref 8.5–10.1)
CHLORIDE: 103 mmol/L (ref 98–107)
Co2: 27 mmol/L (ref 21–32)
Creatinine: 0.61 mg/dL (ref 0.60–1.30)
EGFR (African American): >60
EGFR (Non-African Amer.): >60
GLUCOSE: 98 mg/dL (ref 65–99)
OSMOLALITY: 277 (ref 275–301)
Sodium: 138 mmol/L (ref 136–145)

## 2014-02-10 LAB — MAGNESIUM: MAGNESIUM: 1.7 mg/dL — AB

## 2014-02-10 LAB — PHOSPHORUS: Phosphorus: 5 mg/dL — ABNORMAL HIGH (ref 2.5–4.9)

## 2014-02-10 LAB — URINE CULTURE

## 2014-02-11 LAB — TRIGLYCERIDES
TRIGLYCERIDES: 863 mg/dL — AB (ref 0–200)
Triglycerides: 124 mg/dL (ref 0–200)

## 2014-02-11 LAB — BASIC METABOLIC PANEL
Anion Gap: 8 (ref 7–16)
BUN: 18 mg/dL (ref 7–18)
CO2: 26 mmol/L (ref 21–32)
Calcium, Total: 7.4 mg/dL — ABNORMAL LOW (ref 8.5–10.1)
Chloride: 104 mmol/L (ref 98–107)
Creatinine: 0.62 mg/dL (ref 0.60–1.30)
EGFR (African American): >60
GLUCOSE: 124 mg/dL — AB (ref 65–99)
Osmolality: 279 (ref 275–301)
Potassium: 3.5 mmol/L (ref 3.5–5.1)
Sodium: 138 mmol/L (ref 136–145)

## 2014-02-11 LAB — MAGNESIUM: MAGNESIUM: 1.8 mg/dL

## 2014-02-11 LAB — CULTURE, BLOOD (SINGLE)

## 2014-02-11 LAB — EXPECTORATED SPUTUM ASSESSMENT W GRAM STAIN, RFLX TO RESP C

## 2014-02-11 LAB — PHOSPHORUS: Phosphorus: 3.4 mg/dL (ref 2.5–4.9)

## 2014-02-12 LAB — BASIC METABOLIC PANEL
ANION GAP: 4 — AB (ref 7–16)
BUN: 16 mg/dL (ref 7–18)
CALCIUM: 8.4 mg/dL — AB (ref 8.5–10.1)
CHLORIDE: 109 mmol/L — AB (ref 98–107)
Co2: 29 mmol/L (ref 21–32)
Creatinine: 0.66 mg/dL (ref 0.60–1.30)
EGFR (African American): >60
EGFR (Non-African Amer.): >60
Glucose: 107 mg/dL — ABNORMAL HIGH (ref 65–99)
Osmolality: 285 (ref 275–301)
Potassium: 3.8 mmol/L (ref 3.5–5.1)
Sodium: 142 mmol/L (ref 136–145)

## 2014-02-12 LAB — MAGNESIUM: Magnesium: 1.9 mg/dL

## 2014-02-12 LAB — PHOSPHORUS: PHOSPHORUS: 4 mg/dL (ref 2.5–4.9)

## 2014-02-12 LAB — URINE CULTURE

## 2014-02-13 LAB — BASIC METABOLIC PANEL
Anion Gap: 6 — ABNORMAL LOW (ref 7–16)
BUN: 17 mg/dL (ref 7–18)
CALCIUM: 8.4 mg/dL — AB (ref 8.5–10.1)
Chloride: 110 mmol/L — ABNORMAL HIGH (ref 98–107)
Co2: 28 mmol/L (ref 21–32)
Creatinine: 0.54 mg/dL — ABNORMAL LOW (ref 0.60–1.30)
EGFR (African American): >60
EGFR (Non-African Amer.): >60
GLUCOSE: 94 mg/dL (ref 65–99)
Osmolality: 288 (ref 275–301)
Potassium: 3.3 mmol/L — ABNORMAL LOW (ref 3.5–5.1)
Sodium: 144 mmol/L (ref 136–145)

## 2014-02-13 LAB — MAGNESIUM: Magnesium: 2 mg/dL

## 2014-02-13 LAB — PHOSPHORUS: PHOSPHORUS: 3.7 mg/dL (ref 2.5–4.9)

## 2014-02-14 LAB — CBC WITH DIFFERENTIAL/PLATELET
BASOS PCT: 1 %
Basophil #: 0.2 10*3/uL — ABNORMAL HIGH (ref 0.0–0.1)
EOS ABS: 0.4 10*3/uL (ref 0.0–0.7)
Eosinophil %: 2.5 %
HCT: 28.9 % — ABNORMAL LOW (ref 40.0–52.0)
HGB: 9.6 g/dL — ABNORMAL LOW (ref 13.0–18.0)
LYMPHS ABS: 3.4 10*3/uL (ref 1.0–3.6)
Lymphocyte %: 21.1 %
MCH: 29.8 pg (ref 26.0–34.0)
MCHC: 33.2 g/dL (ref 32.0–36.0)
MCV: 90 fL (ref 80–100)
MONO ABS: 1.6 x10 3/mm — AB (ref 0.2–1.0)
Monocyte %: 9.8 %
Neutrophil #: 10.7 10*3/uL — ABNORMAL HIGH (ref 1.4–6.5)
Neutrophil %: 65.6 %
Platelet: 471 10*3/uL — ABNORMAL HIGH (ref 150–440)
RBC: 3.21 10*6/uL — ABNORMAL LOW (ref 4.40–5.90)
RDW: 14.8 % — ABNORMAL HIGH (ref 11.5–14.5)
WBC: 16.3 10*3/uL — AB (ref 3.8–10.6)

## 2014-02-14 LAB — BASIC METABOLIC PANEL
Anion Gap: 8 (ref 7–16)
BUN: 17 mg/dL (ref 7–18)
CALCIUM: 8.7 mg/dL (ref 8.5–10.1)
CHLORIDE: 108 mmol/L — AB (ref 98–107)
Co2: 27 mmol/L (ref 21–32)
Creatinine: 0.76 mg/dL (ref 0.60–1.30)
EGFR (African American): >60
EGFR (Non-African Amer.): >60
Glucose: 99 mg/dL (ref 65–99)
Osmolality: 287 (ref 275–301)
POTASSIUM: 3.2 mmol/L — AB (ref 3.5–5.1)
Sodium: 143 mmol/L (ref 136–145)

## 2014-02-14 LAB — POTASSIUM: Potassium: 4 mmol/L (ref 3.5–5.1)

## 2014-02-14 LAB — PHOSPHORUS: PHOSPHORUS: 3.9 mg/dL (ref 2.5–4.9)

## 2014-02-14 LAB — CLOSTRIDIUM DIFFICILE(ARMC)

## 2014-02-14 LAB — MAGNESIUM
MAGNESIUM: 1.8 mg/dL
MAGNESIUM: 2.4 mg/dL

## 2014-02-15 LAB — BASIC METABOLIC PANEL
ANION GAP: 7 (ref 7–16)
BUN: 16 mg/dL (ref 7–18)
CALCIUM: 8.5 mg/dL (ref 8.5–10.1)
CHLORIDE: 108 mmol/L — AB (ref 98–107)
CO2: 25 mmol/L (ref 21–32)
CREATININE: 0.59 mg/dL — AB (ref 0.60–1.30)
GLUCOSE: 106 mg/dL — AB (ref 65–99)
Osmolality: 281 (ref 275–301)
Potassium: 3.7 mmol/L (ref 3.5–5.1)
SODIUM: 140 mmol/L (ref 136–145)

## 2014-02-15 LAB — MAGNESIUM: Magnesium: 2 mg/dL

## 2014-02-15 LAB — CULTURE, BLOOD (SINGLE)

## 2014-02-15 LAB — PHOSPHORUS: Phosphorus: 4.3 mg/dL (ref 2.5–4.9)

## 2014-02-16 LAB — CBC WITH DIFFERENTIAL/PLATELET
Basophil #: 0.3 10*3/uL — ABNORMAL HIGH (ref 0.0–0.1)
Basophil %: 1.4 %
Eosinophil #: 0.6 10*3/uL (ref 0.0–0.7)
Eosinophil %: 3.6 %
HCT: 32.4 % — ABNORMAL LOW (ref 40.0–52.0)
HGB: 10.3 g/dL — ABNORMAL LOW (ref 13.0–18.0)
Lymphocyte #: 3.1 10*3/uL (ref 1.0–3.6)
Lymphocyte %: 17.4 %
MCH: 28.7 pg (ref 26.0–34.0)
MCHC: 31.8 g/dL — ABNORMAL LOW (ref 32.0–36.0)
MCV: 90 fL (ref 80–100)
Monocyte #: 1.6 x10 3/mm — ABNORMAL HIGH (ref 0.2–1.0)
Monocyte %: 8.9 %
Neutrophil #: 12.1 10*3/uL — ABNORMAL HIGH (ref 1.4–6.5)
Neutrophil %: 68.7 %
Platelet: 472 10*3/uL — ABNORMAL HIGH (ref 150–440)
RBC: 3.59 10*6/uL — ABNORMAL LOW (ref 4.40–5.90)
RDW: 15 % — ABNORMAL HIGH (ref 11.5–14.5)
WBC: 17.7 10*3/uL — ABNORMAL HIGH (ref 3.8–10.6)

## 2014-02-16 LAB — POTASSIUM: Potassium: 3.7 mmol/L (ref 3.5–5.1)

## 2014-02-16 LAB — CULTURE, BLOOD (SINGLE)

## 2014-02-16 LAB — MAGNESIUM: Magnesium: 1.8 mg/dL

## 2014-02-16 LAB — CREATININE, SERUM
Creatinine: 0.68 mg/dL (ref 0.60–1.30)
EGFR (African American): >60

## 2014-02-16 LAB — PHOSPHORUS: PHOSPHORUS: 3.7 mg/dL (ref 2.5–4.9)

## 2014-02-17 LAB — CBC WITH DIFFERENTIAL/PLATELET
BASOS ABS: 0.1 10*3/uL (ref 0.0–0.1)
Basophil %: 0.6 %
EOS ABS: 1.3 10*3/uL — AB (ref 0.0–0.7)
EOS PCT: 6.7 %
HCT: 34.7 % — ABNORMAL LOW (ref 40.0–52.0)
HGB: 11.2 g/dL — AB (ref 13.0–18.0)
LYMPHS PCT: 15.9 %
Lymphocyte #: 3 10*3/uL (ref 1.0–3.6)
MCH: 28.9 pg (ref 26.0–34.0)
MCHC: 32.2 g/dL (ref 32.0–36.0)
MCV: 90 fL (ref 80–100)
Monocyte #: 1.7 x10 3/mm — ABNORMAL HIGH (ref 0.2–1.0)
Monocyte %: 9 %
Neutrophil #: 12.9 10*3/uL — ABNORMAL HIGH (ref 1.4–6.5)
Neutrophil %: 67.8 %
Platelet: 518 10*3/uL — ABNORMAL HIGH (ref 150–440)
RBC: 3.87 10*6/uL — ABNORMAL LOW (ref 4.40–5.90)
RDW: 15.1 % — AB (ref 11.5–14.5)
WBC: 19.1 10*3/uL — ABNORMAL HIGH (ref 3.8–10.6)

## 2014-02-17 LAB — SODIUM: SODIUM: 141 mmol/L (ref 136–145)

## 2014-02-17 LAB — MAGNESIUM: Magnesium: 2.1 mg/dL

## 2014-02-17 LAB — POTASSIUM: Potassium: 3.5 mmol/L (ref 3.5–5.1)

## 2014-02-17 LAB — PHOSPHORUS: Phosphorus: 4.2 mg/dL (ref 2.5–4.9)

## 2014-02-19 LAB — POTASSIUM: Potassium: 3.8 mmol/L (ref 3.5–5.1)

## 2014-02-19 LAB — MAGNESIUM: MAGNESIUM: 2.2 mg/dL

## 2014-02-19 LAB — PHOSPHORUS: Phosphorus: 4.6 mg/dL (ref 2.5–4.9)

## 2014-02-20 LAB — CBC WITH DIFFERENTIAL/PLATELET
Basophil #: 0.1 10*3/uL (ref 0.0–0.1)
Basophil %: 0.8 %
Eosinophil #: 1.5 10*3/uL — ABNORMAL HIGH (ref 0.0–0.7)
Eosinophil %: 8.1 %
HCT: 37.1 % — ABNORMAL LOW (ref 40.0–52.0)
HGB: 12.3 g/dL — ABNORMAL LOW (ref 13.0–18.0)
Lymphocyte #: 3.8 10*3/uL — ABNORMAL HIGH (ref 1.0–3.6)
Lymphocyte %: 20.5 %
MCH: 29.7 pg (ref 26.0–34.0)
MCHC: 33.2 g/dL (ref 32.0–36.0)
MCV: 90 fL (ref 80–100)
MONO ABS: 2 x10 3/mm — AB (ref 0.2–1.0)
Monocyte %: 11 %
Neutrophil #: 11.1 10*3/uL — ABNORMAL HIGH (ref 1.4–6.5)
Neutrophil %: 59.6 %
PLATELETS: 479 10*3/uL — AB (ref 150–440)
RBC: 4.15 10*6/uL — AB (ref 4.40–5.90)
RDW: 15.6 % — ABNORMAL HIGH (ref 11.5–14.5)
WBC: 18.7 10*3/uL — ABNORMAL HIGH (ref 3.8–10.6)

## 2014-02-20 LAB — PHOSPHORUS: Phosphorus: 4.5 mg/dL (ref 2.5–4.9)

## 2014-02-20 LAB — CREATININE, SERUM
Creatinine: 0.63 mg/dL (ref 0.60–1.30)
EGFR (Non-African Amer.): >60

## 2014-02-20 LAB — POTASSIUM: Potassium: 3.8 mmol/L (ref 3.5–5.1)

## 2014-02-25 ENCOUNTER — Inpatient Hospital Stay: Payer: Self-pay | Admitting: Internal Medicine

## 2014-02-25 LAB — COMPREHENSIVE METABOLIC PANEL
ALBUMIN: 3.6 g/dL (ref 3.4–5.0)
ALK PHOS: 90 U/L
ANION GAP: 6 — AB (ref 7–16)
BILIRUBIN TOTAL: 0.8 mg/dL (ref 0.2–1.0)
BUN: 13 mg/dL (ref 7–18)
CREATININE: 0.65 mg/dL (ref 0.60–1.30)
Calcium, Total: 10.2 mg/dL — ABNORMAL HIGH (ref 8.5–10.1)
Chloride: 99 mmol/L (ref 98–107)
Co2: 30 mmol/L (ref 21–32)
EGFR (Non-African Amer.): >60
Glucose: 107 mg/dL — ABNORMAL HIGH (ref 65–99)
Osmolality: 271 (ref 275–301)
POTASSIUM: 4.7 mmol/L (ref 3.5–5.1)
SGOT(AST): 32 U/L (ref 15–37)
SGPT (ALT): 34 U/L (ref 12–78)
SODIUM: 135 mmol/L — AB (ref 136–145)
Total Protein: 9.3 g/dL — ABNORMAL HIGH (ref 6.4–8.2)

## 2014-02-25 LAB — CBC
HCT: 37.9 % — AB (ref 40.0–52.0)
HGB: 12.3 g/dL — AB (ref 13.0–18.0)
MCH: 28.8 pg (ref 26.0–34.0)
MCHC: 32.4 g/dL (ref 32.0–36.0)
MCV: 89 fL (ref 80–100)
Platelet: 451 10*3/uL — ABNORMAL HIGH (ref 150–440)
RBC: 4.26 10*6/uL — ABNORMAL LOW (ref 4.40–5.90)
RDW: 15.8 % — ABNORMAL HIGH (ref 11.5–14.5)
WBC: 24.4 10*3/uL — ABNORMAL HIGH (ref 3.8–10.6)

## 2014-02-25 LAB — APTT: ACTIVATED PTT: 31.2 s (ref 23.6–35.9)

## 2014-02-26 DIAGNOSIS — I059 Rheumatic mitral valve disease, unspecified: Secondary | ICD-10-CM

## 2014-02-26 LAB — CBC WITH DIFFERENTIAL/PLATELET
BASOS PCT: 1.1 %
Basophil #: 0.2 10*3/uL — ABNORMAL HIGH (ref 0.0–0.1)
Eosinophil #: 0.3 10*3/uL (ref 0.0–0.7)
Eosinophil %: 1.3 %
HCT: 33.2 % — AB (ref 40.0–52.0)
HGB: 10.6 g/dL — ABNORMAL LOW (ref 13.0–18.0)
Lymphocyte #: 3.5 10*3/uL (ref 1.0–3.6)
Lymphocyte %: 18.5 %
MCH: 28.1 pg (ref 26.0–34.0)
MCHC: 31.9 g/dL — ABNORMAL LOW (ref 32.0–36.0)
MCV: 88 fL (ref 80–100)
Monocyte #: 2.5 x10 3/mm — ABNORMAL HIGH (ref 0.2–1.0)
Monocyte %: 13 %
Neutrophil #: 12.7 10*3/uL — ABNORMAL HIGH (ref 1.4–6.5)
Neutrophil %: 66.1 %
Platelet: 390 10*3/uL (ref 150–440)
RBC: 3.77 10*6/uL — AB (ref 4.40–5.90)
RDW: 16 % — ABNORMAL HIGH (ref 11.5–14.5)
WBC: 19.2 10*3/uL — ABNORMAL HIGH (ref 3.8–10.6)

## 2014-02-26 LAB — URINALYSIS, COMPLETE
Bacteria: NONE SEEN
Bilirubin,UR: NEGATIVE
Blood: NEGATIVE
GLUCOSE, UR: NEGATIVE mg/dL (ref 0–75)
KETONE: NEGATIVE
Leukocyte Esterase: NEGATIVE
NITRITE: NEGATIVE
Ph: 5 (ref 4.5–8.0)
Protein: NEGATIVE
RBC,UR: 3 /HPF (ref 0–5)
SPECIFIC GRAVITY: 1.041 (ref 1.003–1.030)
Squamous Epithelial: NONE SEEN
WBC UR: 2 /HPF (ref 0–5)

## 2014-02-26 LAB — APTT
Activated PTT: 47.3 secs — ABNORMAL HIGH (ref 23.6–35.9)
Activated PTT: 45.1 secs — ABNORMAL HIGH (ref 23.6–35.9)
Activated PTT: 38.6 secs — ABNORMAL HIGH (ref 23.6–35.9)

## 2014-02-27 LAB — APTT
ACTIVATED PTT: 54.7 s — AB (ref 23.6–35.9)
ACTIVATED PTT: 51.1 s — AB (ref 23.6–35.9)
Activated PTT: 47.8 secs — ABNORMAL HIGH (ref 23.6–35.9)

## 2014-02-27 LAB — CBC WITH DIFFERENTIAL/PLATELET
BASOS ABS: 0.2 10*3/uL — AB (ref 0.0–0.1)
Basophil %: 1.1 %
EOS ABS: 0.5 10*3/uL (ref 0.0–0.7)
EOS PCT: 2.9 %
HCT: 34.1 % — AB (ref 40.0–52.0)
HGB: 10.9 g/dL — ABNORMAL LOW (ref 13.0–18.0)
LYMPHS PCT: 22.5 %
Lymphocyte #: 3.8 10*3/uL — ABNORMAL HIGH (ref 1.0–3.6)
MCH: 28.2 pg (ref 26.0–34.0)
MCHC: 31.9 g/dL — ABNORMAL LOW (ref 32.0–36.0)
MCV: 88 fL (ref 80–100)
MONOS PCT: 12.3 %
Monocyte #: 2.1 x10 3/mm — ABNORMAL HIGH (ref 0.2–1.0)
NEUTROS PCT: 61.2 %
Neutrophil #: 10.2 10*3/uL — ABNORMAL HIGH (ref 1.4–6.5)
Platelet: 382 10*3/uL (ref 150–440)
RBC: 3.86 10*6/uL — ABNORMAL LOW (ref 4.40–5.90)
RDW: 16 % — ABNORMAL HIGH (ref 11.5–14.5)
WBC: 16.7 10*3/uL — ABNORMAL HIGH (ref 3.8–10.6)

## 2014-02-28 LAB — APTT
ACTIVATED PTT: 44.2 s — AB (ref 23.6–35.9)
Activated PTT: 61.9 secs — ABNORMAL HIGH (ref 23.6–35.9)

## 2014-02-28 LAB — CREATININE, SERUM
CREATININE: 0.54 mg/dL — AB (ref 0.60–1.30)
EGFR (African American): >60
EGFR (Non-African Amer.): >60

## 2014-03-01 LAB — PLATELET COUNT: Platelet: 391 10*3/uL (ref 150–440)

## 2014-03-01 LAB — HEMOGLOBIN: HGB: 10.8 g/dL — ABNORMAL LOW (ref 13.0–18.0)

## 2014-03-02 LAB — CULTURE, BLOOD (SINGLE)

## 2014-03-22 ENCOUNTER — Emergency Department: Payer: Self-pay | Admitting: Emergency Medicine

## 2014-03-22 LAB — LIPASE, BLOOD: Lipase: 137 U/L (ref 73–393)

## 2014-03-22 LAB — HEPATIC FUNCTION PANEL A (ARMC)
ALT: 47 U/L (ref 12–78)
Albumin: 4.2 g/dL (ref 3.4–5.0)
Alkaline Phosphatase: 79 U/L
Bilirubin,Total: 0.4 mg/dL (ref 0.2–1.0)
SGOT(AST): 48 U/L — ABNORMAL HIGH (ref 15–37)
Total Protein: 8.1 g/dL (ref 6.4–8.2)

## 2014-03-22 LAB — URINALYSIS, COMPLETE
BACTERIA: NONE SEEN
Bilirubin,UR: NEGATIVE
Blood: NEGATIVE
GLUCOSE, UR: NEGATIVE mg/dL (ref 0–75)
Ketone: NEGATIVE
LEUKOCYTE ESTERASE: NEGATIVE
NITRITE: NEGATIVE
Ph: 5 (ref 4.5–8.0)
Protein: NEGATIVE
RBC,UR: NONE SEEN /HPF (ref 0–5)
Specific Gravity: 1.026 (ref 1.003–1.030)
Squamous Epithelial: NONE SEEN
WBC UR: <1 /HPF (ref 0–5)

## 2014-03-22 LAB — BASIC METABOLIC PANEL
ANION GAP: 8 (ref 7–16)
BUN: 12 mg/dL (ref 7–18)
CHLORIDE: 102 mmol/L (ref 98–107)
CO2: 26 mmol/L (ref 21–32)
CREATININE: 0.54 mg/dL — AB (ref 0.60–1.30)
Calcium, Total: 9.3 mg/dL (ref 8.5–10.1)
EGFR (Non-African Amer.): >60
GLUCOSE: 91 mg/dL (ref 65–99)
OSMOLALITY: 271 (ref 275–301)
Potassium: 4.8 mmol/L (ref 3.5–5.1)
Sodium: 136 mmol/L (ref 136–145)

## 2014-03-22 LAB — CBC
HCT: 46.2 % (ref 40.0–52.0)
HGB: 14.7 g/dL (ref 13.0–18.0)
MCH: 28.3 pg (ref 26.0–34.0)
MCHC: 31.9 g/dL — AB (ref 32.0–36.0)
MCV: 89 fL (ref 80–100)
Platelet: 290 10*3/uL (ref 150–440)
RBC: 5.21 10*6/uL (ref 4.40–5.90)
RDW: 17.9 % — ABNORMAL HIGH (ref 11.5–14.5)
WBC: 15.6 10*3/uL — AB (ref 3.8–10.6)

## 2014-03-22 LAB — TROPONIN I

## 2014-03-24 ENCOUNTER — Emergency Department: Payer: Self-pay | Admitting: Emergency Medicine

## 2014-03-24 LAB — BASIC METABOLIC PANEL
Anion Gap: 5 — ABNORMAL LOW (ref 7–16)
BUN: 9 mg/dL (ref 7–18)
CALCIUM: 9 mg/dL (ref 8.5–10.1)
CHLORIDE: 106 mmol/L (ref 98–107)
Co2: 27 mmol/L (ref 21–32)
Creatinine: 0.95 mg/dL (ref 0.60–1.30)
EGFR (African American): >60
Glucose: 84 mg/dL (ref 65–99)
Osmolality: 274 (ref 275–301)
Potassium: 3.7 mmol/L (ref 3.5–5.1)
Sodium: 138 mmol/L (ref 136–145)

## 2014-03-24 LAB — CBC
HCT: 41.4 % (ref 40.0–52.0)
HGB: 13.5 g/dL (ref 13.0–18.0)
MCH: 28.9 pg (ref 26.0–34.0)
MCHC: 32.6 g/dL (ref 32.0–36.0)
MCV: 89 fL (ref 80–100)
Platelet: 241 10*3/uL (ref 150–440)
RBC: 4.67 10*6/uL (ref 4.40–5.90)
RDW: 18.4 % — ABNORMAL HIGH (ref 11.5–14.5)
WBC: 12 10*3/uL — ABNORMAL HIGH (ref 3.8–10.6)

## 2014-03-24 LAB — TROPONIN I

## 2014-07-21 ENCOUNTER — Ambulatory Visit: Payer: Self-pay | Admitting: Vascular Surgery

## 2015-02-21 NOTE — Discharge Summary (Signed)
PATIENT NAME:  Edwin [name], Macallan P MR#:  [id number] DATE OF BIRTH:  11-24-1985  DATE OF ADMISSION:  01/21/2014 DATE OF DISCHARGE:  02/22/2014  ADMITTING PHYSICIAN:  Dr. Mordecai MaesSanchez.   DISCHARGING PHYSICIAN: Dr. Enid Baasadhika Quantavia Frith    DISCHARGE DIAGNOSES: 1.  Acute respiratory failure, has been ventilator dependent, currently tracheostomy taken off and on room air.  2.  Acute lung injury.  3.  Aspiration pneumonia.  4.  Metabolic encephalopathy.  5.  Polysubstance abuse.  6.  Acute renal failure. 7.  PEG tube placement for dysphagia.  8.  Sinusitis.  9.  Depression.  10.  Insomnia.  11.  Anemia, with acute illness, resolved.    DISCHARGE MEDICATIONS:  1.  Trazodone 50 mg p.o. at bedtime.  2.  Lexapro 10 mg p.o. daily.  3.  Xanax 0.5 mg twice a day as needed for anxiety.  4.  Metoprolol 50 mg p.o. b.i.d.   DISCHARGE DIET: Low-sodium diet.   DISCHARGE ACTIVITY: As tolerated.   FOLLOWUP INSTRUCTIONS: 1.  Follow up with PCP at home.  2.  [facility].  3.  GI follow-up with Dr. Bluford Kaufmannh  in 5 to 6 weeks for PEG tube removal.  4.  Follow up for substance abuse treatment as an outpatient.  5.  Flush PEG tube with 25 mL of water 3 times a day.   LABS AND IMAGING STUDIES PRIOR TO DISCHARGE:  WBC 18.7, hemoglobin 12.3, hematocrit 37.1, platelet count 479. Sodium 140, potassium 3.7, chloride 108, bicarbonate 25, BUN 16, creatinine 0.5, glucose 96, calcium of 88.5.   HOSPITAL COURSE:  For more details, please look at the history and physical on admission by Dr. Mordecai MaesSanchez and the last interim discharge summary dictated by Dr. Enedina FinnerSona Patel on April 24,2015.   Edwin [name] is 29 year old young Caucasian male with no significant past medical history other than substance abuse, was admitted as he was found down and intubated for acute respiratory failure.  1.  Acute respiratory failure, had aspiration pneumonia and acute lung injury, has prolonged  hospitalization, ventilator-dependent  respiratory failure, finally had a tracheostomy done on 02/06/2014. The patient did well post-tracheostomy and was on a trach collar,  with speaking well, and had his tracheostomy taken out by ENT yesterday with dressing in place.  He is doing fine, speech is normal and he is able to eat a regular diet.  2.  Acute sinusitis, treated with a 10-day course of meropenem. ID has been following the patient while he was in the hospital. He has been afebrile at this time.  3.  Superficial vein deep venous thrombosis on the left side posterior tibial vein. No need for long-term anticoagulation per vascular.  4. Toxic metabolic encephalopathy from polysubstance abuse, had difficulty wakening from event, prolonged period of unresponsiveness, in spite of not being on sedation. However, patient's mental status is back to baseline and he is working with physical therapy who has recommended that he can go home.  5. Polysubstance abuse. Psychiatry has seen the patient once he was off the vent and recommended outpatient follow-up. The patient is alert, awake and acknowledges the risks with drugs and is interested in quitting at this time.  6. Depression and insomnia. He is started on trazodone and Lexapro as per psychiatric recommendation. He is also on Xanax as needed.  7.  Hypertension. He is on metoprolol.  8.  His course has been, otherwise, uneventful in the hospital.   DISCHARGE CONDITION: Stable.  DISCHARGE DISPOSITION: Home.   TIME SPENT ON DISCHARGE:  40 minutes.    ____________________________ Enid Baas, MD rk:aj D: 02/22/2014 11:50:56 ET T: 02/22/2014 23:58:54 ET JOB#: [id number]  cc: Enid Baas, MD, <Dictator> Enid Baas MD ELECTRONICALLY SIGNED 03/19/2014 13:48

## 2015-02-21 NOTE — H&P (Signed)
PATIENT NAME:  Edwin [name], Edwin [name] MR#:  [name] DATE OF BIRTH:  1985/12/24  DATE OF ADMISSION:  02/25/2014  CHIEF COMPLAINT: Left-sided upper back pain.   HISTORY OF PRESENT ILLNESS: A 29 year old Caucasian male patient with history of polysubstance abuse in the past, along with recent hospital stay for over a month with acute respiratory failure, trach, PEG after having acute lung injury from aspiration pneumonia and substance abuse. Also diagnosed with a left posterior tibial vein during last admission, presents with left upper back pain. The patient was found to be tachycardic and had a CT scan of the chest done, which shows bilateral pulmonary embolisms. The patient mentions that he noticed this pain 2 days prior. Has also been having some hemoptysis. Otherwise, he has done well since discharge.   Presently, he does not have any chest pain. No lightheadedness, dizziness. Has been ambulating well. Has been eating orally, taking his pills regularly.   PAST MEDICAL HISTORY: 1.  Polysubstance abuse with acute encephalopathy, intubated, with aspiration pneumonia and prolonged hospital stay in April 2015.  2.  Left posterior tibial vein DVT.   3.  PEG tube placement.  4.  Tracheostomy. 5.  Depression.  6.  Anemia of chronic disease.   HOME MEDICATIONS: 1.  Trazodone 50 mg daily.  2.  Lexapro 10 mg daily.  3.  Xanax 0.5 twice a day as needed for anxiety.  4.  Metoprolol 50 mg oral b.i.d.   SOCIAL HISTORY: The patient presently does not smoke. No alcohol. No illicit drugs. He is ambulating on his own.   CODE STATUS: FULL CODE.   REVIEW OF SYSTEMS: CONSTITUTIONAL:  Complains of some fatigue.  EYES: No blurred vision, pain. HEENT: No tinnitus, ear pain, hearing loss.  RESPIRATORY: Has hemoptysis. No shortness of breath, but has cough.  CARDIOVASCULAR: No chest pain. Does have upper back pain. No orthopnea or edema.  GASTROINTESTINAL: No nausea, vomiting, diarrhea, abdominal pain.   GENITOURINARY: No dysuria, hematuria, or frequency.  ENDOCRINE: No polyuria, nocturia, thyroid problems. HEMATOLOGIC/LYMPHATIC:  No anemia, easy bruising or bleeding. INTEGUMENT:  No rash or lesion.  MUSCULOSKELETAL:  Has upper back pain. NEUROLOGIC:  No focal numbness, weakness, seizure. PSYCHIATRIC:  No anxiety or depression.  ALLERGIES: No known drug allergies.   PHYSICAL EXAMINATION: VITAL SIGNS: Temperature 98.4, pulse of 119, blood pressure 140/72, saturating 96% on room air.  GENERAL: A moderately built Caucasian male patient lying in bed, seems comfortable, conversational, cooperative with exam.  PSYCHIATRIC:  Alert and oriented x 3. Mood and affect appropriate. Judgment intact.  HEENT: Atraumatic, normocephalic. Oral mucosa moist and pink. External ears and nose normal. No pallor. No icterus. Pupils bilaterally equal and reactive to light.  NECK: Supple. No thyromegaly or palpable lymph nodes. Trachea midline. No carotid bruit, JVD. Neck has a trach dressing. CARDIOVASCULAR: S1, S2, without any murmurs. Tachycardic. Peripheral pulses 2+. No edema.  RESPIRATORY: Normal work of breathing. Clear to auscultation, both sides. GASTROINTESTINAL: Soft abdomen, nontender. Bowel sounds present. No hepatosplenomegaly palpable. Has a PEG tube in place.  GENITOURINARY: No CVA tenderness or bladder distention.  SKIN: Warm and dry. MUSCULOSKELETAL: Has pain in the upper spinal area. Normal muscle tone.  NEUROLOGICAL: Motor strength 5/5 in upper and lower extremities. Sensation is intact all over.  EKG shows sinus tachycardia at 114, nonspecific T-wave changes.   CTA of the chest for pulmonary embolism shows pulmonary arterial emboli in the distal left main pulmonary artery, with extension into the left lower lobe and lingular branches.  Pulmonary embolism is segmental branch of the right lower lobe. Areas of increased density within lung bases, possible infiltrate versus atelectasis, or  possibility for infarction. Small bilateral effusions.   Lower extremity venous Doppler done on 02/11/2014 showed left posterior tibial vein thrombus.   ASSESSMENT AND PLAN: 1. Acute bilateral pulmonary embolus, with a large one on the left side and associated hemoptysis in a patient with history of recent left posterior tibial vein deep vein thrombosis. Will start patient on a heparin drip. He does have hemoptysis, which might increase. Will consult Pulmonary for further input with the case. During his recent admission, the left posterior vein DVT was thought to be below the knee, and full anticoagulation was deferred, and patient was supposed to get an outpatient follow up venous Doppler, but patient had PE prior to having that follow up or follow up imaging. Presently he is critically ill secondary to the pulmonary embolism. He does seem to have bilateral small infarctions. Needs close monitoring on tele floor. Will admit under inpatient. The patient will likely need greater than 48 hours hospital stay.   2.  Hypertension, with baseline tachycardia. Continue the metoprolol the patient has, but his tachycardia is worse, likely from the PE.   3.  Leukocytosis. The patient has had persistent leukocytosis, even at time of discharge. Today this is worse, but he does not have any fever. Does not complain of any shortness of breath. Does seem to have infiltrate versus atelectasis at the base of the lungs. Will not start him on any antibiotics at this time. Monitor leukocytosis. Watch for any fevers.   4.  Polysubstance abuse history. The patient has not done any illicit drugs, alcohol, or tobacco.  I have encouraged him to keep away from any smoking or drugs.   CODE STATUS:  FULL CODE.   Time spent today on this case was 45 minutes.    ____________________________ Molinda Bailiff Takeia Ciaravino, MD srs:mr D: 02/25/2014 19:51:32 ET T: 02/25/2014 20:29:49 ET JOB#: [id number]  cc: Wardell Heath R. Rayna Brenner, MD,  <Dictator> Orie Fisherman MD ELECTRONICALLY SIGNED 03/28/2014 14:10

## 2015-02-21 NOTE — Op Note (Signed)
PATIENT NAME:  [name], Edwin [name] MR#:  [id number] DATE OF BIRTH:  1985/11/14  DATE OF PROCEDURE:  02/26/2014  PREOPERATIVE DIAGNOSES: 1.  Deep venous thrombosis and pulmonary embolus.  2.  Hemoptysis.  3.  Recent severe lung injury from aspiration pneumonia and polysubstance abuse.   POSTOPERATIVE DIAGNOSES: 1.  Deep venous thrombosis and pulmonary embolus.  2.  Hemoptysis.  3.  Recent severe lung injury from aspiration pneumonia and polysubstance abuse.  PROCEDURE:  Inferior vena cava filter placement.   SURGEON: Annice NeedyJason S. Danamarie Minami, M.D.   ANESTHESIA: Local with 2 mg Versed.   ESTIMATED BLOOD LOSS: Minimal.  FLUOROSCOPY TIME: Less than 1 minute.  CONTRAST:  15 mL.   INDICATION FOR PROCEDURE: A 29 year old white male admitted with pulmonary embolus and DVT. He had a DVT on his previous admission and failed aspirin therapy. He is nonprogression and bilateral DVTs are seen. He has hemoptysis and there is fear he will not be able to maintain his anticoagulation and that, as well as his recent lung injury, we were asked to place an IVC filter.   DESCRIPTION OF PROCEDURE: The patient's groin was sterilely prepped and draped and a sterile surgical field was created. The femoral vein was visualized under ultrasound and accessed without difficulty. Under ultrasound guidance, a Seldinger needle with J-wire was placed. After skin nick and dilatation, a delivery sheath was placed into the inferior vena cava and inferior venacavogram was performed. The renal veins were at the top of L1 and the filter was deployed at the bottom of L1. The delivery sheath was removed.     ____________________________ Annice NeedyJason S. Tyric Rodeheaver, MD jsd:ce D: 02/26/2014 17:18:12 ET T: 02/26/2014 20:13:04 ET JOB#: [id number]  cc: Annice NeedyJason S. Janiaya Ryser, MD, <Dictator> Annice NeedyJASON S Ajah Vanhoose MD ELECTRONICALLY SIGNED 03/06/2014 14:22

## 2015-02-21 NOTE — Consult Note (Signed)
PATIENT NAME:  Edwin [name], Edwin [name] MR#:  [id number] DATE OF BIRTH:  1985/11/10  DATE OF CONSULTATION:  02/09/2014  REFERRING PHYSICIAN:  Dr. Juliene Pina CONSULTING PHYSICIAN:  Stann Mainland. Sampson Goon, MD  REASON FOR CONSULTATION: Fevers.   HISTORY OF PRESENT ILLNESS: This is an unfortunate 29 year old gentleman initially admitted 03/24 with being found unresponsive. He was found to have acute respiratory failure from aspiration pneumonia. It was likely felt he had overdosed. He also had acute renal failure and has been unable to wean from his ventilator. There was one attempt at extubation, but he required reintubation. He was initially treated with steroids and antibiotics for the pneumonia. He had a  bronch done, which showed normal flora. He underwent tracheostomy placement 04/09 and PEG 04/10. The patient had initially been treated for pneumonia, but then antibiotics were stopped. He then developed increasing fevers and was restarted on vancomycin and Zosyn on 04/09. We were consulted for further evaluation for the cause of these fevers.   The patient does have an NG tube in place. It is unclear how long that has been in. He does not have any open wounds or draining sores. There is some secretions from his trach but this has been relatively stable. His PEG seems to be functioning well. He is having some diarrhea and does have a rectocele. He also has a Foley catheter in place.   PAST MEDICAL HISTORY: As above. Prior to this admission the patient had a history of IV drug use, but was otherwise healthy.   PAST SURGICAL HISTORY: None.   ALLERGIES: No known drug allergies.   REVIEW OF SYSTEMS: Unable to be obtained.   SOCIAL HISTORY: Per admit note, the patient was a smoker since age 25. He also apparently uses drugs and was found with a needle in his arm when he overdosed.   FAMILY HISTORY: Positive for cancer and heart disease.   MEDICATIONS: Antibiotics, since 04/09 the patient has been on vancomycin  and Zosyn. Prior to that, he was treated with Zosyn until 04/04, as well as vancomycin initially. Other medications include fentanyl drip, propofol drip, DuoNeb, Lovenox Ativan, nitroglycerin, sliding scale insulin, Pepcid, glycopyrrolate, scopolamine, fentanyl, morphine, Zofran.   PHYSICAL EXAMINATION: VITAL SIGNS: Temperature on 04/11 at 5:00 a.m. was 1:02. On the tenth his temperature max was 101.6. On the 9th, T-max was 101.5, on the 8th  he was afebrile, but on the 7th,  100.9. Prior to that, he had been afebrile since stopping antibiotics. Pulse 76, blood pressure 113/54, pulse oximetry 95%, respirations 20.  GENERAL: He has a trach and a PEG. He is not responsive at this point due to sedation.  HEENT: Pupils equal, round, and reactive to light and accommodation.  OROPHARYNX: Clear. He does have an NG tube in place.  HEART: Regular.  LUNGS: Coarse breath sounds bilaterally.  ABDOMEN: Soft, nontender. PEG is within normal limits.  EXTREMITIES: 1+ edema bilaterally around his ankles.  SKIN: No lesions or rash.  MUSCULOSKELETAL: No obvious erythema or effusion on his large joints.  NEUROLOGIC: The patient is sedated.   LABORATORY, DIAGNOSTIC AND RADIOLOGICAL DATA: Micro data is reviewed. On 04/11 C. difficile was negative, 04/10 sputum culture was normal flora, 04/09 blood cultures x 2 negative, 04/03 C. difficile negative, 04/03 blood culture negative, 04/02 urine culture negative, 03/25 sputum culture from a BAL normal flora, 03/24 blood culture is negative.   Renal function shows a creatinine of 0.6. Most recent LFTs were done 03/29 and those were normal except albumin  down at 1.9, AST slightly elevated.   Urine drug screen on admission was positive for opiates and benzos and cocaine. White blood count is 14.1, hemoglobin 7.8, platelets 409, prior wb  count yesterday was 16.0 and on 04/04, 15.8. Urinalysis 04/11 showed two white cells. HIV test 04/08 was negative.   Imaging: CT of the head  04/09 was negative. CT of the chest 04/04 persistent perihilar basilar opacification. CT of the sinuses done yesterday shows sinus inflammation involving the maxillary sinus, sphenoid sinus and ethmoidal air cell.   IMPRESSION: A 29 year old with vent-dependent respiratory failure and chronic PEG after overdose. He has been having persistent fevers and elevated white count. He was restarted on Vancomycin and Zosyn on 04/09. He does have CT evidence of a sinusitis. This was noted when the nurse his NG tube once the PEG was cleared for use yesterday.   Other possibilities besides sinusitis would include biliary process, so we will check LFTs.  With his sputum been relatively normal for flora, I do not think he has an pneumonia but likely sinusitis is the cause of his fever.   I recommend we discontinue the vancomycin and continue the Zosyn for now. If he worsens he may need ENT evaluation, but at this point now that the NG tube is out,  I would recommend following.   Thank you for the consult. I will be glad to follow with you.   ____________________________ Stann Mainlandavid P. Sampson GoonFitzgerald, MD dpf:sg D: 02/09/2014 09:29:39 ET T: 02/09/2014 12:25:05 ET JOB#: [id number]  cc: Stann Mainlandavid P. Sampson GoonFitzgerald, MD, <Dictator> Marlise Fahr Sampson GoonFITZGERALD MD ELECTRONICALLY SIGNED 02/11/2014 22:20

## 2015-02-21 NOTE — Op Note (Signed)
PATIENT NAME:  Edwin [name], Edwin [name] MR#:  [id number] DATE OF BIRTH:  02/17/1986  DATE OF PROCEDURE:  02/06/2014  DATE OF BIRTH: Respiratory failure.   POSTOPERATIVE DIAGNOSIS: Tracheostomy.   SURGEON: Marion DownerScott Chamara Dyck, MD   ANESTHESIA: General endotracheal.   INDICATIONS: The patient with aspiration pneumonia with respiratory failure, unable to be weaned from the vent.   DESCRIPTION OF PROCEDURE: After obtaining informed consent from the family, the patient was taken to the Operating Room and moved to the OR table and placed in the supine position. He was already intubated and sedated. Lidocaine 1% with epinephrine 1:100,000 was injected in the skin overlying the trachea. He was then prepped and draped in the usual sterile fashion. A 15 blade was used to incise the skin and the incision carried down through the platysma with the Bovie and the Harmonic scalpel. The strap muscles were identified and divided in the midline and retracted laterally. The thyroid gland was encountered. There were a number of large veins over the thyroid gland, which were suture ligated. The thyroid gland was then divided with the Harmonic scalpel. Some other areas of bleeding were encountered and dealt with, with either the Harmonic scalpel or suture ties as encountered. The area was carefully inspected and bleeding was felt to be well controlled. The trachea was well exposed and an incision made between the second and third tracheal rings. An inferior based flap was created in the trachea and sutured towards the skin with a 2-0 silk suture. A #6 Shiley trach was then placed after having the anesthesiologist pull the endotracheal tube back. The patient was hooked up to the anesthesia circuit and CO2 return was obtained. The airway was suctioned and oxygenation was felt to be excellent. The retractors were removed and the tracheostomy tube sewn to the skin with 2-0 silk sutures, followed by placement of a trach tie. The patient was  then returned to the anesthesiologist for transfer back to the CCU. Blood loss was approximately 25 mL.   ____________________________ Edwin GrossPaul S. Willeen CassBennett, MD psb:sg D: 02/06/2014 13:34:01 ET T: 02/06/2014 13:49:29 ET JOB#: [id number]  cc: Edwin GrossPaul S. Willeen CassBennett, MD, <Dictator> Sandi MealyPAUL S Arynn Armand MD ELECTRONICALLY SIGNED 02/19/2014 7:47
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
## 2015-02-21 NOTE — Discharge Summary (Signed)
PATIENT NAME:  Edwin [name], Edwin [name] MR#:  [id number] DATE OF BIRTH:  12/03/1985  DATE OF ADMISSION:  02/25/2014 DATE OF DISCHARGE:  03/01/2014  PRESENTING COMPLAINT: Left-sided upper back pain.    DISCHARGE DIAGNOSES: 1. Acute bilateral pulmonary embolism.  2. Right lower extremity deep vein thrombosis.  3. Status post tracheostomy due to respiratory failure.  4. Pleuritic chest pain.   PROCEDURES:  IVC filter placement. Saturations more than 90% on room air.   CODE STATUS: Full code.   MEDICATIONS:  1. Trazodone 50 mg at bedtime.  2. Metoprolol 50 mg b.i.d.  3. Xanax 0.5 mg b.i.d. [name]  4. Escitalopram 10 mg daily.  5. Acetaminophen/hydrocodone 320/5 one tablet 6 hours as needed.  6. Xarelto 50 mg b.i.d. with meals for first 21 days, then take 20 mg [name] daily.   FOLLOWUP:  1. Follow up with Dr. Meredeth IdeFleming in 1-2 weeks. .  2. Keep your appointment with Dr. Bluford Kaufmannh in 2-4 weeks to get your PEG tube removed.  3. Establish PCP with [facility].   BRIEF SUMMARY OF HOSPITAL COURSE: The patient is a 29 year old Caucasian gentleman with recent hospitalization for drug overdose who had developed several complaictions  is status post tracheostomy and PEG, comes in with:  1. Acute bilateral PE and right lower extremity DVT. The patient was started on IV heparin drip and is now changed to [name] Xarelto. He was having some pleuritic chest pain along with some hemoptysis, seen by Dr. Meredeth IdeFleming. His hemoglobin count remained stable. The patient's sats were stable at room air. He will follow up with Dr. Meredeth IdeFleming as outpatient.  It was discussed with patient that some hemoptysis is expected and is due to his lung infarction from PE.  No right ventricular strain was noted on echocardiogram.  2. Hypertension. Continue metoprolol.  3. Anxiety. Continue Xanax.  4. Status post PEG placement. The patient is supposed to follow up with Dr. Bluford Kaufmannh as outpatient to remove PEG down the road.   Hospital stay otherwise  remained stable. The patient will follow up at [facility] as well.  His hemoglobin at discharge was 10.8 cm.   TIME SPENT: 40 minutes.    ____________________________ Wylie HailSona A. Allena KatzPatel, MD sap:dd D: 03/04/2014 14:42:52 ET T: 03/05/2014 00:09:55 ET JOB#: [id number]  cc: Ledford Goodson A. Allena KatzPatel, MD, <Dictator> Willow OraSONA A Eilah Common MD ELECTRONICALLY SIGNED 03/23/2014 16:11

## 2015-02-21 NOTE — Consult Note (Signed)
   Comments   Consult cancelled.   Electronic Signatures: Borders, Daryl EasternJoshua R (NP)  (Signed 10-Apr-15 09:14)  Authored: Palliative Care   Last Updated: 10-Apr-15 09:14 by Malachy MoanBorders, Joshua R (NP)

## 2015-02-21 NOTE — Consult Note (Signed)
PATIENT NAME:  Edwin [name], Edwin [name] MR#:  [id number] DATE OF BIRTH:  [birth year]  DATE OF CONSULTATION:  02/05/2014  REFERRING PHYSICIAN: Dr. Belia Heman  CONSULTING PHYSICIAN:  Ollen Gross. Willeen Cass, MD  REASON FOR CONSULTATION: Tracheostomy.   HISTORY OF PRESENT ILLNESS: This is a 29 year old male admitted on 01/21/2014 after being found unresponsive in his bathroom. He was intubated in the Emergency Room at that time. A CT scan of the head at that time showed no evidence of any intracranial abnormalities, although he did have a left posterior scalp laceration. Possible drug paraphernalia had been found at the scene and he was positive for cocaine and opiates. The patient has remained intubated on the ventilator with difficulties weaning him from the ventilator thus far. His respiratory failure is felt to be secondary to aspiration pneumonia. A recent echocardiogram showed normal left ventricular function. He is also felt to have encephalopathy secondary to drug abuse. Currently, he also has had some issues with acute renal failure, possibly secondary to acute tubular necrosis.   PAST MEDICAL HISTORY: Prior to admission, the patient was otherwise healthy other than possible history of drug abuse.   ALLERGIES: None.  PAST SURGICAL HISTORY: None.   SOCIAL HISTORY: Prior smoker of a pack a day since age 63. Drinking occasionally.   FAMILY HISTORY: Positive for ovarian cancer, breast cancer in grandmother and grandfather had heart issues and diabetes.   MEDICATIONS: Fentanyl drip, propofol drip, Precedex drip, Tylenol 325 mg 2 per OG tube q.4 hours p.r.n. temperature, Combivent 4 puffs q.4 hours, Lovenox 40 mg subcutaneous daily, Ativan 2 mg IV q.1 hour p.r.n. agitation, nitroglycerin 2% ointment topically q.6 hours p.r.n. for hypertension, sliding scale insulin, Pepcid 20 mg per OG tube q.12 hours, alprazolam 0.5 mg per OG tube q.8 hours, Versed 2 mg IV push q.2 hours p.r.n. agitation and Flovent 2 puffs  b.i.d.  REVIEW OF SYSTEMS: Unobtainable as the patient is sedated and intubated.   PHYSICAL EXAM:  VITAL SIGNS: Temperature 98.1, pulse 70, blood pressure 110/59.  GENERAL: Well-developed, well-nourished male, intubated and sedated on the vent unresponsive to commands. HEAD AND FACE: Head is normocephalic, atraumatic. There are no facial skin lesions. Facial strength cannot be assessed as the patient is sedated.  EARS: External ears are unremarkable. Ear canals are free of cerumen. Tympanic membrane on the right is clear. Left tympanic membrane has a small amount of fluid, but no acute infection.  NOSE: External nose unremarkable. Nasal cavity is clear with a straight nasal septum. No purulence or polyps are seen.  ORAL CAVITY AND OROPHARYNX: The patient is intubated so exam is limited. The lips and anterior tongue and floor of mouth appear unremarkable.  NECK: Supple without adenopathy or mass. There is no thyromegaly. The patient has good landmarks and I could easily palpate the cricoid cartilage. There are no scars in the anterior neck. He has a central line in the left neck.  NEUROLOGIC: Unobtainable due to sedation.  DATE REVIEW: Last CBC on April 04 showed a hemoglobin of 10.4 and platelet count of 493,000.   ASSESSMENT: A patient with respiratory failure, prior aspiration pneumonia and drug-related encephalopathy.   PLAN: We will begin looking at possible dates for a tracheostomy. Depending on OR availability, this may not possible until early next week. As noted, our physicians have a scheduled main OR time this Thursday or Friday. Ideally, the patient will be converted to heparin for anticoagulation to allow easy reversal prior to surgery.  ____________________________ Ollen Gross. Willeen Cass, MD  psb:aw D: 02/05/2014 11:17:42 ET T: 02/05/2014 11:43:23 ET JOB#: [id number]  cc: Ollen GrossPaul S. Willeen CassBennett, MD, <Dictator> Sandi MealyPAUL S Ulas Zuercher MD ELECTRONICALLY SIGNED 02/19/2014 7:47

## 2015-02-21 NOTE — Consult Note (Signed)
Brief Consult Note: Diagnosis: Respiratory failure.  Aspiration pneumonia.  Drug-related encephalopathy.  History of polysubstance abuse.  Failure to thrive secondary to noted other diagnoses.   Consult note dictated.   Orders entered.   Discussed with Attending MD.   Comments: Patient's presentation discussed with Dr. Lutricia FeilPaul Oh.  Patient seen by Dr. Bluford Kaufmannh as well. Discussed procedure of placement of gastrostomy tube with patient's mother, POA as well as grandmother who was present.  In agreement for PEG tube placement tomorrow.  Order placed for procedure and consent to be signed.  Feeding able to be restarted until midnight this evening, then NPO after.  Lovenox to be given this pm and then held for tomorrow. Will continue to monitor.  Electronic Signatures: Rodman [name], Edwin Thies S (NP)  (Signed 09-Apr-15 16:07)  Authored: Brief Consult Note   Last Updated: 09-Apr-15 16:07 by Rodman [name], Edwin Kotowski S (NP)

## 2015-02-21 NOTE — Consult Note (Signed)
   Comments   Consult noted. Will see in AM.   Electronic Signatures: Chalonda Schlatter, Daryl EasternJoshua R (NP)  (Signed 09-Apr-15 17:15)  Authored: Palliative Care   Last Updated: 09-Apr-15 17:15 by Malachy MoanBorders, Illana Nolting R (NP)

## 2015-02-21 NOTE — Op Note (Signed)
PATIENT NAME:  Edwin [name], Promise P MR#:  [id number] DATE OF BIRTH:  [birth year]  DATE OF PROCEDURE:  07/21/2014  PREOPERATIVE DIAGNOSES:  1.  Deep venous thrombosis and pulmonary embolus.  2.  Status post inferior vena cava filter.  3.  History of aspiration pneumonia.   POSTOPERATIVE DIAGNOSES: 1.  Deep venous thrombosis and pulmonary embolus.  2.  Status post inferior vena cava filter.  3.  History of aspiration pneumonia.   PROCEDURES:  1.  Ultrasound guidance for vascular access, right jugular vein.  2.  Catheter placement into inferior vena cava from right jugular approach.  3.  Inferior venacavogram.  4.  Retrieval of an IVC filter.   SURGEON: Annice NeedyJason S Dew, MD    ANESTHESIA: Local with moderate conscious sedation.   ESTIMATED BLOOD LOSS: Minimal.   FLUOROSCOPY TIME: About 1 minute.   CONTRAST USED: 15 mL.   INDICATION FOR PROCEDURE:  A 29 year old male who had an IVC filter placed earlier this year for pulmonary embolus and aspiration pneumonia.  He has done well.  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  He has completed well over 3 months of anticoagulation.  He is here for filter retrieval to avoid long-term complications of a filter. Risks and benefits were discussed.  Informed consent was obtained.   DESCRIPTION OF PROCEDURE: The patient is brought to the vascular suite. Right neck was sterilely prepped and draped and a sterile surgical field was created.  The right jugular vein was visualized with ultrasound and found to be widely patent. It was then accessed under direct ultrasound guidance without difficulty with Seldinger needle. A J-wire was placed.  After skin nick and dilatation, the delivery sheath was placed over the wire and parked in the inferior vena cava just above the filter.  Inferior venacavogram was performed through the sheath.  This demonstrated a patent IVC without any thrombosis. The hook on the top of the filter was then  snared.  The sheath was advanced and collapsed the filter and the filter was removed in its entirety through the sheath.  The retrieval system was then removed.  Pressure was held.  Sterile dressing was placed.  The patient tolerated the procedure well and was taken to the recovery room in stable condition.      ____________________________ Annice NeedyJason S. Dew, MD jsd:DT D: 07/21/2014 11:27:12 ET T: 07/21/2014 12:54:52 ET JOB#: [id number]  cc: Annice NeedyJason S. Dew, MD, <Dictator> Annice NeedyJASON S DEW MD ELECTRONICALLY SIGNED 08/12/2014 10:18

## 2015-02-21 NOTE — Consult Note (Signed)
Janina Mayorach site looks good. No bleeding from site. Sutures may be removed in 1 week.  Electronic Signatures: Sandi MealyBennett, Shawnae Leiva S (MD)  (Signed on 10-Apr-15 17:12)  Authored  Last Updated: 10-Apr-15 17:12 by Sandi MealyBennett, Malachi Suderman S (MD)

## 2015-02-21 NOTE — Consult Note (Signed)
CHIEF COMPLAINT and HISTORY:  Subjective/Chief Complaint DVT/PE/hemoptysis   History of Present Illness Patient with recent admission for polysubstance abuse and aspiration pneumonia.  Spent about a month in the hospital and had a trach and PEG at one point.  [name] has been removed.  Was recently discharged.  Had tibial vein DVT at last admission and had some early chest pains in hindsight.  Was on ASA but is admitted with bilateral DVT on duplex and PE on CT scan.  Given his hemoptysis as well as his recent severe lung injury, we are asked to place an IVC filter.   PAST MEDICAL/SURGICAL HISTORY:  Past Medical History:   trach:    Drug Use:    ETOH use:    Denies medical history:    mouth surgery:   ALLERGIES:  Allergies:  No Known Allergies:   HOME MEDICATIONS:  Home Medications: Medication Instructions Status  metoprolol tartrate 50 mg oral tablet 1 tab(s) orally every 12 hours Active  ALPRAZolam 0.5 mg oral tablet 1 tab(s) orally every 12 hours, As Needed - for Anxiety, Nervousness Active  escitalopram 10 mg oral tablet 1 tab(s) orally once a day Active  traZODone 50 mg oral tablet 1 tab(s) orally once a day (at bedtime) Active   Family and Social History:  Family History Non-Contributory   Social History positive  tobacco, positive  tobacco (Current within 1 year), positive ETOH, positive Illicit drugs   + Tobacco Current (within 1 year)   Place of Living Home   Review of Systems:  Fever/Chills Yes   Cough Yes   Sputum No   Abdominal Pain No   Diarrhea No   Constipation No   Nausea/Vomiting No   SOB/DOE Yes   Chest Pain Yes   Telemetry Reviewed NSR   Dysuria No   Tolerating PT Yes   Tolerating Diet Yes   Medications/Allergies Reviewed Medications/Allergies reviewed   Physical Exam:  GEN well developed, well nourished   HEENT pink conjunctivae, moist oral mucosa   NECK No masses  dressing over recently removed tracheostomy   RESP  normal resp effort  no use of accessory muscles   CARD regular rate  no JVD   VASCULAR ACCESS none   ABD denies tenderness  soft   GU no superpubic tenderness   LYMPH negative neck, negative axillae   EXTR negative cyanosis/clubbing, positive edema, mild edema BLE   SKIN normal to palpation, skin turgor good   NEURO cranial nerves intact, motor/sensory function intact   PSYCH alert, A+O to time, place, person   LABS:  Laboratory Results: LabObservation:    29-Apr-15 08:04, Echo Doppler  OBSERVATION   Reason for Test    29-Apr-15 11:38, [place] Color Flow Doppler Low Extrem Bilat (Legs)  OBSERVATION   Reason for Test recent DVT. Now PE  Hepatic:    28-Apr-15 15:58, Comprehensive Metabolic Panel  Bilirubin, Total 0.8  Alkaline Phosphatase 90  45-117  NOTE: New Reference Range  09/20/13  SGPT (ALT) 34  SGOT (AST) 32  Total Protein, Serum 9.3  Albumin, Serum 3.6  Routine Micro:    28-Apr-15 19:13, Blood Culture  Micro Text Report   BLOOD CULTURE    COMMENT                   NO GROWTH IN 8-12 HOURS     ANTIBIOTIC  Culture Comment   NO GROWTH IN 8-12 HOURS   Result(s) reported on 26 Feb 2014 at 03:00AM.  28-Apr-15 19:24, Blood Culture  Micro Text Report   BLOOD CULTURE    COMMENT                   NO GROWTH IN 8-12 HOURS     ANTIBIOTIC  Culture Comment   NO GROWTH IN 8-12 HOURS   Result(s) reported on 26 Feb 2014 at 03:00AM.  Cardiology:    28-Apr-15 16:08, ED ECG  Ventricular Rate 114  Atrial Rate 114  P-R Interval 144  QRS Duration 88  QT 320  QTc 441  P Axis 37  R Axis 32  T Axis 33  ECG interpretation   Sinus tachycardia  Nonspecific T wave abnormality  Abnormal ECG  When compared with ECG of 21-Jan-2014 08:59,  ST no longer depressed in Lateral leads  Nonspecific T wave abnormality has replaced inverted T waves in Inferior leads  ----------unconfirmed----------  Confirmed by OVERREAD, NOT ([id number]), editor PEARSON, BARBARA ([id number]) on 02/26/2014  10:21:12 AM  ED ECG     29-Apr-15 08:04, Echo Doppler  Echo Doppler   REASON FOR EXAM:      COMMENTS:       PROCEDURE: [place] - ECHO DOPPLER COMPLETE(TRANSTHOR)  - Feb 26 2014  8:04AM     RESULT: Echocardiogram Report    Patient Name:   Edwin [name] Date of Exam: 02/26/2014  Medical Rec #:  [id number]           Custom1:  Date of Birth:  [birth year]       Height:       68.0 in  Patient Age:    29 years         Weight:       192.0 lb  Patient Gender: M                BSA:          2.01 m??    Indications: Pulmonary embolism  Sonographer:    Sherrie Sport RDCS  Referring Phys:SUDINI, [place], R    Summary:   1. Left ventricular ejection fraction, by visual estimation, is 55 to   60%.   2. Normal global left ventricular systolic function.   3. Normal right ventricular size and systolic function.   4. Mild mitral valve regurgitation.   5. Normal RVSP  2D AND M-MODE MEASUREMENTS (normal ranges within parentheses):  Left Ventricle:          Normal  IVSd (2D):      1.16 cm (0.7-1.1)  LVPWd (2D):     1.16 cm (0.7-1.1) Aorta/LA:                  Normal  LVIDd (2D):     4.78 cm (3.4-5.7) Aortic Root (2D): 3.40 cm (2.4-3.7)  LVIDs (2D):     3.53 cm           Left Atrium (2D): 3.20 cm (1.9-4.0)  LV FS (2D):     26.2 %   (>25%)  LV EF (2D):     51.2 %   (>50%)                                    Right Ventricle:                      RVd (2D):        2.94 cm  LV DIASTOLIC FUNCTION:  MV Peak E: 0.76 m/s E/e' Ratio: 6.60  MV Peak A: 0.61 m/s Decel Time: 204 msec  E/A Ratio: 1.25  SPECTRAL DOPPLER ANALYSIS (where applicable):  Mitral Valve:  MV P1/2 Time: 59.16 msec  MV Area, PHT: 3.72 cm??  Aortic Valve: AoV Max Vel: 1.08 m/s AoV Peak PG: 4.7 mmHg AoV Mean PG:  LVOT Vmax: 0.87 m/s LVOT VTI:  LVOT Diameter: 2.20 cm  AoV Area, Vmax: 3.06 cm?? AoV Area, VTI:  AoV Area, Vmn:  Tricuspid Valve and PA/RV Systolic Pressure: TR Max Velocity: 1.66 m/s RA   Pressure: 5 mmHg RVSP/PASP: 16.1  mmHg  Pulmonic Valve:  PV Max Velocity: 1.16 m/s PV Max PG: 5.4 mmHg PV Mean PG:    PHYSICIAN INTERPRETATION:  Left Ventricle: The left ventricular internal cavity size was normal.LV   posterior wall thickness was normal. Global LV systolic function was   normal. Left ventricular ejection fraction, by visual estimation, is 55   to 60%.  Right Ventricle: Normal right ventricular size, wall thickness, and   systolic function. The right ventricular size is normal. Global RV   systolic function is normal.  Left Atrium: The left atrium is normal in size.  Right Atrium: The right atrium is normal in size.  Pericardium: There is no evidence of pericardial effusion.  Mitral Valve:The mitral valve is normal in structure. Mild mitral valve   regurgitation is seen.  Tricuspid Valve: The tricuspid valve is normal. Trivial tricuspid   regurgitation is visualized. The tricuspid regurgitant velocity is 1.66   m/s, and with an assumed right atrial pressure of 5 mmHg, the estimated   right ventricular systolic pressure is normal at 16.1 mmHg.  Aortic Valve: The aortic valve is structurally normal, with no evidence   of sclerosis or stenosis. No evidence of aortic valve regurgitation is   seen.  Pulmonic Valve: The pulmonic valve is normal. Mild pulmonic valve   regurgitation.  Aorta: The aortic root and ascending aorta are structurally normal, with   no evidence of dilitation.  [id number] Ida Rogue MD  Electronically signed [phone] Ida Rogue MD  Signature Date/Time: 02/26/2014/10:20:56 AM    *** Final ***    IMPRESSION: .        Verified By: Minna Merritts, M.D., MD  Routine Chem:    28-Apr-15 15:58, Comprehensive Metabolic Panel  Glucose, Serum 107  BUN 13  Creatinine (comp) 0.65  Sodium, Serum 135  Potassium, Serum 4.7  Chloride, Serum 99  CO2, Serum 30  Calcium (Total), Serum 10.2  Osmolality (calc) 271  eGFR (African American) >60  eGFR (Non-African American) >60  eGFR  values <41m/min/1.73 m2 may be an indication of chronic  kidney disease (CKD).  Calculated eGFR is useful in patients with stable renal function.  The eGFR calculation will not be reliable in acutely ill patients  when serum creatinine is changing rapidly. It is not useful in   patients on dialysis. The eGFR calculation may not be applicable  to patients at the low and high extremes of body sizes, pregnant  women, and vegetarians.  Anion Gap 6    28-Apr-15 15:58, Hemogram, Platelet Count  Result Comment   HEMOGRAM/PLT - SMEAR SCANNED   Result(s) reported on 25 Feb 2014 at 04:53PM.    29-Apr-15 02:53, CBC Profile  Result Comment   Differential - SMEAR SCANNED   Result(s) reported on 26 Feb 2014 at 05:12AM.  Routine Coag:    28-Apr-15 15:58, Activated PTT  Activated PTT (APTT) 31.2  A HCT value >55% may artifactually increase the APTT. In one study,  the increase was an average of 19%.  Reference: "Effect on Routine and Special Coagulation Testing Values  of Citrate Anticoagulant Adjustment in Patients with High HCT Values."  American Journal of Clinical Pathology 2006;126:400-405.    29-Apr-15 02:53, Activated PTT  Activated PTT (APTT) 47.3  A HCT value >55% may artifactually increase the APTT. In one study,  the increase was an average of 19%.  Reference: "Effect on Routine and Special Coagulation Testing Values  of Citrate Anticoagulant Adjustment in Patients with High HCT Values."  American Journal of Clinical Pathology 2006;126:400-405.    29-Apr-15 10:00, Activated PTT  Activated PTT (APTT) 45.1  A HCT value >55% may artifactually increase the APTT. In one study,  the increase was an average of 19%.  Reference: "Effect on Routine and Special Coagulation Testing Values  of Citrate Anticoagulant Adjustment in Patients with High HCT Values."  American Journal of Clinical Pathology 2006;126:400-405.  Routine Hem:    28-Apr-15 15:58, Hemogram, Platelet Count  WBC (CBC) 24.4   RBC (CBC) 4.26  Hemoglobin (CBC) 12.3  Hematocrit (CBC) 37.9  Platelet Count (CBC) 451  MCV 89  MCH 28.8  MCHC 32.4  RDW 15.8    29-Apr-15 02:53, CBC Profile  WBC (CBC) 19.2  RBC (CBC) 3.77  Hemoglobin (CBC) 10.6  Hematocrit (CBC) 33.2  Platelet Count (CBC) 390  MCV 88  MCH 28.1  MCHC 31.9  RDW 16.0  Neutrophil % 66.1  Lymphocyte % 18.5  Monocyte % 13.0  Eosinophil % 1.3  Basophil % 1.1  Neutrophil # 12.7  Lymphocyte # 3.5  Monocyte # 2.5  Eosinophil # 0.3  Basophil # 0.2   RADIOLOGY:  Radiology Results: XRay:    24-Mar-15 09:45, Chest Portable Single View  Chest Portable Single View  REASON FOR EXAM:    Altered Mental Status  COMMENTS:       PROCEDURE: DXR - DXR PORTABLE CHEST SINGLE VIEW  - Jan 21 2014  9:45AM     CLINICAL DATA:  Altered mental status.    EXAM:  PORTABLE CHEST - 1 VIEW    COMPARISON:  Chest x-ray dated 06/12/2011    FINDINGS:  Endotracheal tube and NG tube appear in good position. The patient  has bilateral upper lobe pulmonary infiltrates, right greater than  left. This could represent aspiration pneumonitis. Heart size is  normal. No effusions.     IMPRESSION:  Bilateral upper lobe pulmonary infiltrates, worrisome for aspiration  pneumonitis.      Electronically Signed    By: Rozetta Nunnery M.D.    On: 01/21/2014 09:59         Verified By: Larey Seat, M.D.,    27-Mar-15 11:58, Chest Portable Single View  Chest Portable Single View  REASON FOR EXAM:    Central line placement  COMMENTS:       PROCEDURE: DXR - DXR PORTABLE CHEST SINGLE VIEW  - Jan 24 2014 11:58AM     CLINICAL DATA:  Central line placement    EXAM:  PORTABLE CHEST - 1 VIEW    COMPARISON:  01/21/2014    FINDINGS:  Left jugular central venous catheter has been placed with the tip in  the SVC. No pneumothorax.  Endotracheal tube in good position.    Progressive airspace disease in the right lung base may represent  atelectasis or pneumonia. This  extends into the right upper lobe and  is associated with right  pleural effusion.     IMPRESSION:  Satisfactory central line placement.    Progression of extensive right upper lobe and right lower lobe  airspace disease and right effusion which may represent pneumonia.      Electronically Signed    By: Franchot Gallo M.D.    On: 01/24/2014 12:05         Verified By: Truett Perna, M.D.,    29-Mar-15 06:43, Chest Portable Single View  Chest Portable Single View  REASON FOR EXAM:    follow up pneumonia  COMMENTS:       PROCEDURE: DXR - DXR PORTABLE CHEST SINGLE VIEW  - Jan 26 2014  6:43AM     CLINICAL DATA:  Followup pneumonia.    EXAM:  PORTABLE CHEST - 1 VIEW    COMPARISON:  01/24/2014    FINDINGS:  Continued consolidation throughout much of the right lung,  unchanged. Heart is mildly enlarged. No confluent opacity on the  left. Endotracheal tube and left central line remain in place,  unchanged. Interval placement of NG tube into the stomach.     IMPRESSION:  No significant change in the diffuse right lung airspace disease.      Electronically Signed    By: Rolm Baptise M.D.    On: 01/26/2014 07:06         Verified By: Raelyn Number, M.D.,    30-Mar-15 08:06, Chest Portable Single View  Chest Portable Single View  REASON FOR EXAM:    follow up pneumonia  COMMENTS:       PROCEDURE: DXR - DXR PORTABLE CHEST SINGLE VIEW  - Jan 27 2014  8:06AM     CLINICAL DATA:  Respiratory failure and pneumonia.    EXAM:  PORTABLE CHEST - 1 VIEW    COMPARISON:  DG CHEST 1V PORT dated 01/26/2014; DG CHEST 1V PORT  dated 01/24/2014    FINDINGS:  Endotracheal tube tip is approximately 1 cm above the carina.  Central line positioning is stable in the SVC. Lungs show persistent  right middle lower lung infiltrate with slight improvement in  aeration. There is worsening of atelectasis versus infiltrate in the  left lower lung. No edema or significant pleural fluid  is  identified. There is stable cardiomegaly.     IMPRESSION:  Right lung infiltrate with improved aeration of the right lung.  Worsening of atelectasis versus additional infiltrate in the left  lower lung.      Electronically Signed    By: Aletta Edouard M.D.    On: 01/27/2014 08:08     Verified By: Azzie Roup, M.D.,    02-Apr-15 06:18, Chest Portable Single View  Chest Portable Single View  REASON FOR EXAM:    resp failure  COMMENTS:       PROCEDURE: DXR - DXR PORTABLE CHEST SINGLE VIEW  - Jan 30 2014  6:18AM     CLINICAL DATA:  Respiratory failure.    EXAM:  PORTABLE CHEST - 1 VIEW    COMPARISON:  Single view of the chest 01/27/2014 and 01/26/2014.    FINDINGS:  Support tubes and lines are unchanged. Right worse than left  airspace disease persists. Aeration in the left mid and lower lung  zones show some improvement. Heart size is mildly enlarged. No  pneumothorax.     IMPRESSION:  No marked change in right side airspace disease likely due to  pneumonia.    Improved left basilar airspace disease compatible with decreased  atelectasis or improved pneumonia.      Electronically Signed    By: Inge Rise M.D.    On: 01/30/2014 07:52     Verified By: Ramond Dial, M.D.,    07-Apr-15 06:44, Chest Portable Single View  Chest Portable Single View  REASON FOR EXAM:    resp failure  COMMENTS:       PROCEDURE: DXR - DXR PORTABLE CHEST SINGLE VIEW  - Feb 04 2014  6:44AM     CLINICAL DATA:  Respiratory failure    EXAM:  PORTABLE CHEST - 1 VIEW    COMPARISON:  01/30/2014    FINDINGS:  Left-sided central venous line, endotracheal tube and nasogastric  catheter are again identified and stable in appearance. Improved  aeration in the right lung base is noted although some persistent  density is seen. Increasing left basilar infiltrate is noted when  compare with the prior study.     IMPRESSION:  Waxing and waning bibasilar airspace disease.  Continued followup is  recommended.      Electronically Signed    By: Inez Catalina M.D.    On: 02/04/2014 08:01         Verified By: Everlene Farrier, M.D.,    08-Apr-15 21:29, Chest Portable Single View  Chest Portable Single View  REASON FOR EXAM:    picc placement  COMMENTS:       PROCEDURE: DXR - DXR PORTABLE CHEST SINGLE VIEW  - Feb 05 2014  9:29PM     CLINICAL DATA:  PICC line placement.    EXAM:  PORTABLE CHEST - 1 VIEW    COMPARISON:  02/04/2014    FINDINGS:  Endotracheal tube has tip 4.9 cm above the carina. Left IJ central  venous catheter is unchanged. Nasogastric tube courses to the region  of the stomach and off the inferior portion of the film. Left-sided  PICC line is present with tip just below the cavoatrialjunction  within the right atrium. Lungs are adequately inflated with  continued perihilar and bibasilar opacification which may represent  interstitial edema with bilateral effusions/ atelectasis. Cannot  exclude infection. There stable cardiomegaly.     IMPRESSION:  Persistent bilateral perihilar and bibasilar opacification which may  represent interstitial edema with bilateral effusions/ atelectasis,  although cannot exclude infection.    Tubes and lines as described.    Electronically Signed    By: Marin Olp M.D.    On: 02/05/2014 21:50         Verified By: Pearletha Alfred, M.D.,    08-Apr-15 23:05, Chest Portable Single View  Chest Portable Single View  REASON FOR EXAM:    Picc placement  COMMENTS:       PROCEDURE: DXR - DXR PORTABLE CHEST SINGLE VIEW  - Feb 05 2014 11:05PM     CLINICAL DATA:  PICC line placement.    EXAM:  PORTABLE CHEST - 1 VIEW    COMPARISON:  Earlier same day as well as 06/12/2011    FINDINGS:  Endotracheal tube, enteric tube, left IJ central venous catheter and  right-sided PICC line are unchanged. There is no change in  persistent bilateral perihilar and bibasilar opacification likely  representing  interstitial edema with small bilateral effusions and  atelectasis. Cannot exclude infection lung bases. Mild stable  cardiomegaly. Remainder the exam is unchanged.     IMPRESSION:  Persistent bilateral perihilar bibasilar opacification unchanged  likely representing interstitial edema with small bilateral  effusions atelectasis.  Cannot exclude infection in the lung bases.    Tubes and lines unchanged.      Electronically Signed    By: Marin Olp M.D.    On: 02/05/2014 23:30         Verified By: Pearletha Alfred, M.D.,    09-Apr-15 00:05, Chest Portable Single View  Chest Portable Single View  REASON FOR EXAM:    Picc Placement #3  COMMENTS:       PROCEDURE: DXR - DXR PORTABLE CHEST SINGLE VIEW  - Feb 06 2014 12:05AM     CLINICAL DATA:  PICC line placement.    EXAM:  PORTABLE CHEST - 1 VIEW    COMPARISON:  Earlier same day.    FINDINGS:  Endotracheal tube, left IJ central venous catheter and enteric tube  are unchanged. Right-sided PICC line has been pulled back slightly  and has tip over the region of the SVC at the level of the carina.  There is persistent bilateral perihilar bibasilar opacification  without significant change. Cardiomediastinal silhouette and  remainder of the exam is unchanged.     IMPRESSION:  Persistent perihilar bibasilar opacification without significant  change.    Tubes and lines as described.      Electronically Signed    By: Marin Olp M.D.    On: 02/06/2014 00:31     Verified By: Pearletha Alfred, M.D.,  [place]:    14-Apr-15 16:19, [place] Color Flow Doppler Low Extrem Bilat (Legs)  [place] Color Flow Doppler Low Extrem Bilat (Legs)  REASON FOR EXAM:    edema, fever eval for clot  COMMENTS:       PROCEDURE: [place]  - US DOPPLER LOW EXTR BILATERAL  - Feb 11 2014  4:19PM     CLINICAL DATA:  Leg swelling and fever    EXAM:  BILATERAL LOWER EXTREMITY VENOUS DOPPLER ULTRASOUND    TECHNIQUE:  Gray-scale sonography with graded compression,  as well as color  Doppler and duplex ultrasound were performed to evaluate the lower  extremity deep venous systems from the level of the common femoral  vein and including the common femoral, femoral, profunda femoral,  popliteal and calf veins including the posterior tibial, peroneal  and gastrocnemius veins when visible. The superficial great  saphenous vein was also interrogated. Spectral Doppler was utilized  to evaluate flow at rest and withdistal augmentation maneuvers in  the common femoral, femoral and popliteal veins.    COMPARISON:  None.    FINDINGS:  RIGHT LOWER EXTREMITY    Common Femoral Vein: No evidence of thrombus. Normal  compressibility, respiratory phasicity and response to augmentation.    Saphenofemoral Junction: No evidence of thrombus. Normal  compressibility and flow on color Doppler imaging.    Profunda Femoral Vein: No evidence of thrombus. Normal  compressibility and flow on color Doppler imaging.    Femoral Vein: No evidence of thrombus. Normal compressibility,  respiratory phasicity and response to augmentation.    Popliteal Vein: No evidence of thrombus. Normal compressibility,  respiratory phasicity and response to augmentation.    Calf Veins: No evidence of thrombus. Normal compressibility and flow  on color Doppler imaging.    Superficial Great Saphenous Vein: No evidence of thrombus. Normal  compressibility and flow on color Doppler imaging.    Venous Reflux:  None.    Other Findings:  None.    LEFT LOWER EXTREMITY    Common Femoral Vein: No evidence of thrombus. Normal  compressibility, respiratory phasicity and response  to augmentation.    Saphenofemoral Junction: No evidence of thrombus. Normal  compressibility and flow on color Doppler imaging.    Profunda Femoral Vein: No evidence of thrombus. Normal  compressibility and flow on color Doppler imaging.    Femoral Vein: No evidence of thrombus. Normal  compressibility,  respiratory phasicity and response to augmentation.    Popliteal Vein: No evidence of thrombus. Normal compressibility,  respiratory phasicity and response to augmentation.    Calf Veins: Occlusive thrombus is noted in the posterior tibial vein  on the left.    Superficial Great Saphenous Vein: No evidence of thrombus. Normal  compressibility and flow on color Doppler imaging.    Venous Reflux:  None.  Other Findings:  None.     IMPRESSION:  Left posterior tibial vein thrombus.      Electronically Signed    By: Inez Catalina M.D.    On: 02/11/2014 16:18         Verified By: Everlene Farrier, M.D.,    29-Apr-15 11:38, [place] Color Flow Doppler Low Extrem Bilat (Legs)  [place] Color Flow Doppler Low Extrem Bilat (Legs)  REASON FOR EXAM:    recent DVT. Now PE  COMMENTS:       PROCEDURE: [place]  - US DOPPLER LOW EXTR BILATERAL  - Feb 26 2014 11:38AM     CLINICAL DATA:  Recent DVT, acute bilateral pulmonary emboli.    EXAM:  BILATERAL LOWER EXTREMITY VENOUS DOPPLER ULTRASOUND    TECHNIQUE:  Gray-scale sonography with graded compression, as well as color  Doppler and duplex ultrasound were performed to evaluate the lower  extremity deep venous systems from the level of the common femoral  vein and including the common femoral, femoral, profunda femoral,  popliteal and calf veins including the posterior tibial, peroneal  and gastrocnemius veins when visible. The superficial great  saphenous vein was also interrogated. Spectral Doppler was utilized  to evaluate flow at rest and with distal augmentation maneuvers in  the common femoral, femoral and popliteal veins.    COMPARISON:  02/11/2014    FINDINGS:  RIGHT LOWER EXTREMITY    Common Femoral Vein: No evidence of thrombus. Normal  compressibility, respiratory phasicity and response to augmentation.    Saphenofemoral Junction: No evidence of thrombus. Normal  compressibility and flow on color Doppler  imaging.    Profunda Femoral Vein: No evidence of thrombus. Normal  compressibility and flow on color Doppler imaging.    Femoral Vein: No evidence of thrombus. Normal compressibility,  respiratory phasicity and response to augmentation.    Popliteal Vein: Intraluminal nonocclusive partially compressible  thrombus present in the right popliteal vein extending into the  right calf tibial and peroneal veins.    Calf Veins: Intraluminal thrombus extends into the right calf tibial  and peroneal veins.  Superficial Great Saphenous Vein: No evidence of thrombus. Normal  compressibility and flow on color Doppler imaging.    Venous Reflux:  None.    Other Findings:  None.    LEFT LOWER EXTREMITY    Common Femoral Vein: No evidence of thrombus. Normal  compressibility, respiratory phasicity and response to augmentation.    Saphenofemoral Junction: No evidence of thrombus. Normal  compressibility and flow on color Doppler imaging.  Profunda Femoral Vein: No evidence of thrombus. Normal  compressibility and flow on color Doppler imaging.    Femoral Vein: No evidence of thrombus. Normal compressibility,  respiratory phasicity and response to augmentation.    Popliteal Vein: No evidence of thrombus.  Normal compressibility,  respiratory phasicity and response to augmentation.    Calf Veins: Left calf peroneal vein demonstrates echogenic  intraluminal thrombus and is

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
## 2015-02-21 NOTE — Consult Note (Signed)
20 Fr G tube successfully placed in stomach. Insertion site at 3 cm. Can remove NG tube. Can start using G tube today for meds and flushes. If stable, can start using TF tomorrow. Resume lovenox in AM. Will sign off. Call GI on call if there are issues with feeding. thanks.  Electronic Signatures: Lutricia Feilh, Zana Biancardi (MD)  (Signed on 10-Apr-15 17:26)  Authored  Last Updated: 10-Apr-15 17:26 by Lutricia Feilh, Amariyah Bazar (MD)

## 2015-02-21 NOTE — Consult Note (Signed)
PATIENT NAME:  Edwin [name], Keino P MR#:  [id number] DATE OF BIRTH:  [birth year]  PSYCHIATRY CONSULTATION   DATE OF ADMISSION: 01/21/2014  DATE OF CONSULTATION:  02/20/2014  REFERRING PHYSICIAN:  Sona A. Allena KatzPatel, MD CONSULTING PHYSICIAN:  Ardeen FillersUzma S. Garnetta BuddyFaheem, MD  REASON FOR CONSULTATION: Depression, history of overdose and was found unresponsive on 01/21/2014.   HISTORY OF PRESENT ILLNESS: The patient is a 29 year old single male who was admitted on 01/21/2014 after he was found unresponsive at home by his grandmother. At that time, most of the history was obtained by the family members. According to the initial history, the patient was found unresponsive in his bathroom, where he lives with his grandmother. His grandmother heard some noises which she described as a vacuum noise off and on, and she decided to go and check on him. She was unable to open the door and found the patient lying on the floor with some needle and drug paraphernalia. She called the EMS, who brought the patient to the ER. At that time, the patient was unresponsive and required significant amount of positive-pressure ventilation. He was admitted to the ICU and remained in the ICU for several days. Finally, the patient was able to respond to the medications and became medically stable. Currently, he is able to communicate with the help of a trach which is capped and a speaker valve is on. He gets his medications through orogastric tube.   During my interview, the patient was sitting in the bed, and his maternal grandmother was present in the room. He reported that he feels anxious, and it was the first time he has used heroin. He reported that he does not remember the amount he has used. He reported that he has used it intermittently and also drinks a 12-pack of beer and 1 fifth of liquor occasionally. However, he usually uses it with his friends. He reported that he feels paranoid and feels that sometimes people are after him. However, he  does not feel paranoid at this time and feels safe being discharged from the hospital. He reported that his drug of choice is marijuana. He reported that he was sober when he was in jail for 45 days for the probation violation when he was driving a car without insurance as well. The patient reported that he does not want to use any drugs or alcohol at this point and is willing to take medication for anxiety. He reported that he does not feel depressed at this time, does not have any mood swings or paranoia. He currently denied having any suicidal or homicidal ideations or plans.   PAST PSYCHIATRIC HISTORY: The patient reported that he has never seen a psychiatrist and never taken any medication for his depression and anxiety. However, he is currently on alprazolam 1 mg q.8 hours for anxiety, which was started while he was intubated. He stated that he cares less for Xanax at this time.   SUBSTANCE ABUSE HISTORY: The patient has a long history of using marijuana. He also binge drinks 12-packs of beer and 1 fifth of liquor over the weekends with his friends. The patient used heroin only once when he became intoxicated and was brought to the hospital. He reported that he has never tried to kill himself. He has never attended any rehab program.   PAST MEDICAL HISTORY: The patient has history of injury to his head and was given staples to his head at that time.   ALLERGIES: No known drug allergies.   SOCIAL  HISTORY: The patient currently lives with his grandmother and is planning to return back to live with her. She is very supportive and was staying in the home. His mother lives in [place], and father lives in [place].   FAMILY HISTORY: Positive for ovarian cancer and breast cancer in the grandmother. He reported there is no history of drug use in the family.   MEDICATIONS: Ibuprofen.   ANCILLARY DATA:   VITAL SIGNS: Pulse 124, respirations 18, blood pressure 143/65.  LABORATORY DATA: Glucose 114,  creatinine 0.63, potassium 3.8. WBC 18.7, RBC 4.15, hemoglobin 12.3, hematocrit 37.1, MCV is 90, RDW is 15.6.   REVIEW OF SYSTEMS: Twelve systems negative except the ones which were indicated positive.   MENTAL STATUS EXAMINATION: The patient is a moderately built male who was sitting in the bed. He covered himself with a bedsheet. He was able to communicate through the trach cap with speaker valve on. His speech was low in tone and volume. Mood was anxious. Affect was congruent. Thought process was logical, goal-directed. Thought content was non-delusional. Language seems normal. Fund of knowledge appears appropriate. He denied having any perceptual disturbances. He denied having any suicidal or homicidal ideations or plans. No withdrawal symptoms noted at this time. Memory seems intact.   DIAGNOSTIC IMPRESSION:  AXIS I:  1. Mood disorder, not otherwise specified.  2. Polysubstance dependence.  AXIS II: None.  AXIS III: Please review the medical history.   TREATMENT PLAN:  1. I discussed with the patient at length about the medications, treatment, risks, benefits and alternatives.  2. I will decrease the dose of alprazolam to 0.5 mg p.o. b.i.d.  3. I will start him on Lexapro 10 mg p.o. daily. Both the medications will be provided through the orogastric tube.  4. The patient can follow up with [facility] once he is discharged from the hospital as they have support staff to help with his substance abuse as well as management of psychotropic medications. The patient was already provided information through Frederico Hamman, Child psychotherapist, and case discussed with his grandmother at length, and she demonstrated understanding.   More than 50% of the time was spent in counseling and coordination of care of this patient.   Thank you for allowing me to participate in the care of this patient.   ____________________________ Ardeen Fillers. Garnetta Buddy, MD usf:lb D: 02/20/2014 13:41:13 ET T: 02/20/2014 13:56:16  ET JOB#: [id number]  cc: Ardeen Fillers. Garnetta Buddy, MD, <Dictator> Rhunette Croft MD ELECTRONICALLY SIGNED 02/20/2014 18:04

## 2015-02-21 NOTE — Consult Note (Signed)
Patient status unchanged. Discussed risks/benefits with family and they agree to proceed.  Electronic Signatures: Sandi MealyBennett, Tison Leibold S (MD)  (Signed on 09-Apr-15 12:05)  Authored  Last Updated: 09-Apr-15 12:05 by Sandi MealyBennett, Castiel Lauricella S (MD)

## 2015-02-21 NOTE — Consult Note (Signed)
Tracheostomy tube removed. Has had trach capped for several days and tolerated complete occlusion.  occlusive dressing (folded 4x4 and 2 inch silk tape) should be kept on wound and changed at least daily until wound is healed. Typically 7-10 days. If any wound healing issues occur, can f/u with me in the office.  Electronic Signatures: Sandi MealyBennett, Hoover Grewe S (MD)  (Signed on 24-Apr-15 17:55)  Authored  Last Updated: 24-Apr-15 17:55 by Sandi MealyBennett, Sawsan Riggio S (MD)

## 2015-05-18 IMAGING — CR DG CHEST 1V PORT
1 series · 1 of 1 positions shown · non-contrast
Comparison: none

[ap]
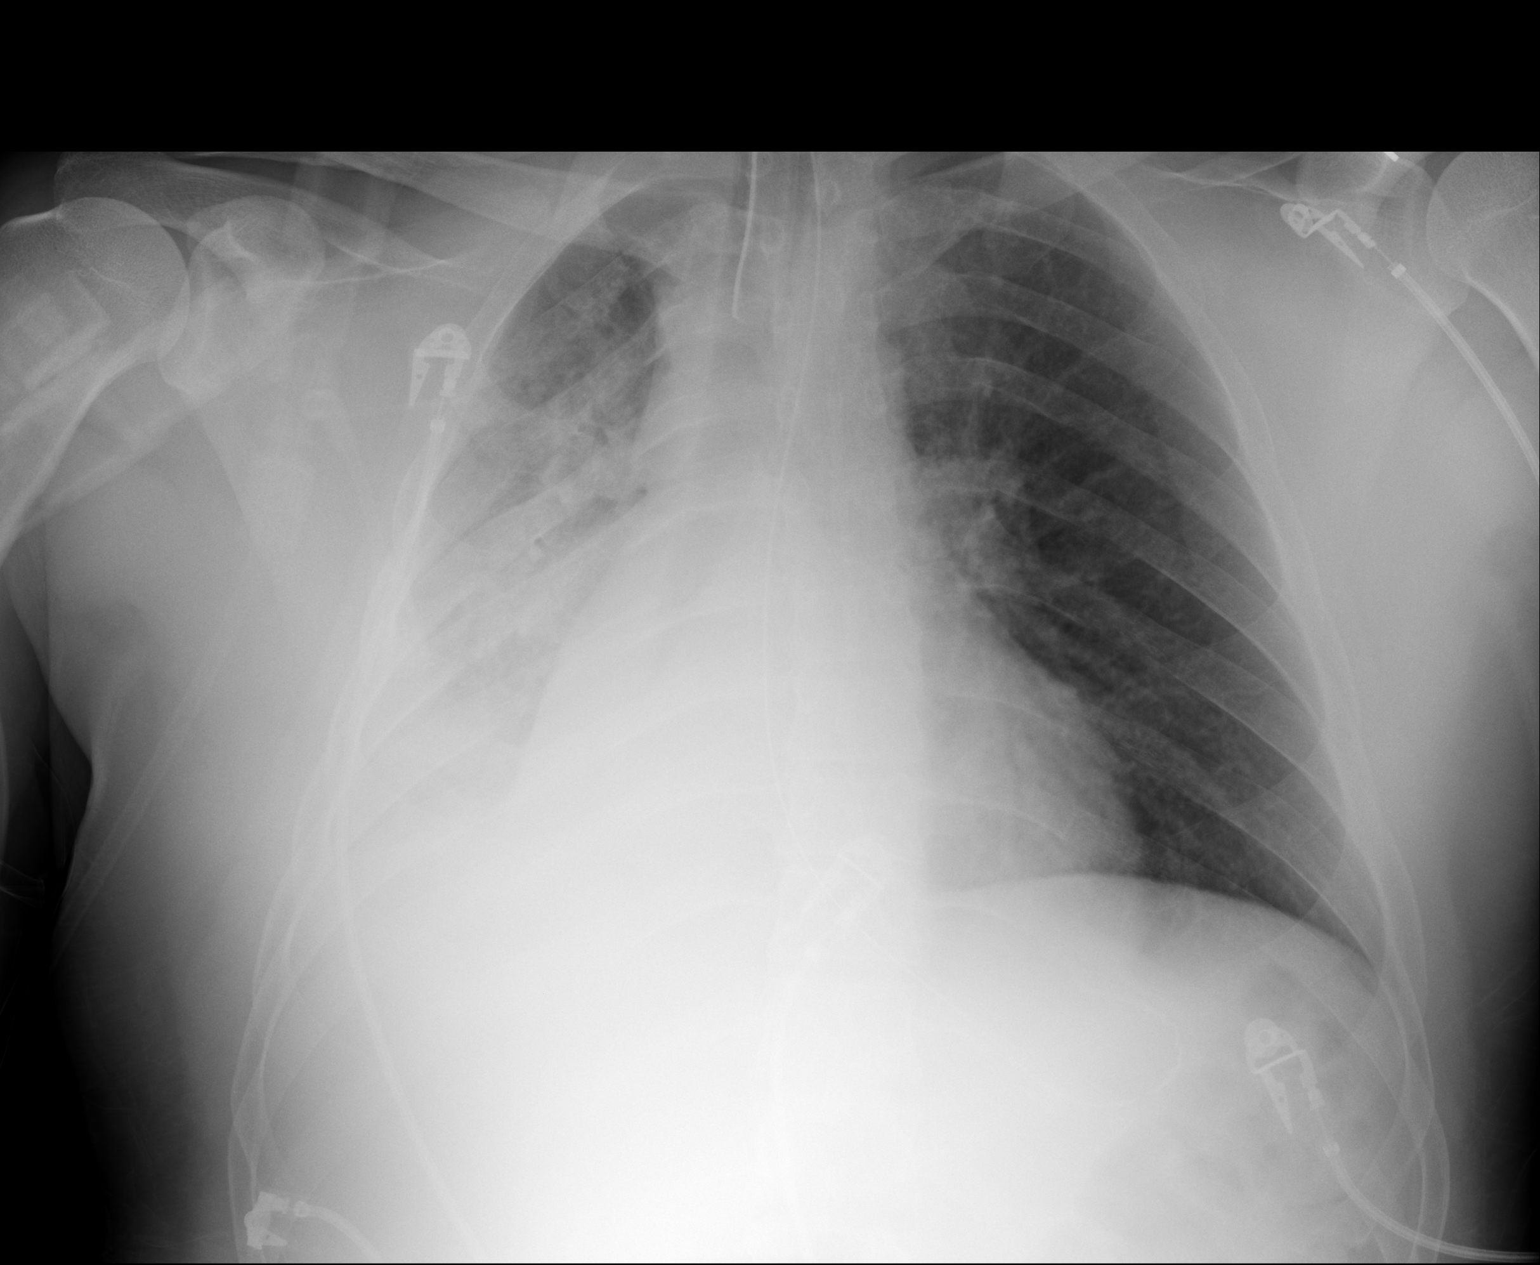

[1 of 1 positions shown; findings below may reference images not displayed]

Canned report from images found in remote index.

Refer to host system for actual result text.

## 2015-05-31 IMAGING — CR DG CHEST 1V PORT
1 series · 1 of 1 positions shown · non-contrast
Comparison: 01/30/2014

CLINICAL DATA: Respiratory failure

EXAM:
PORTABLE CHEST - 1 VIEW

[ap]
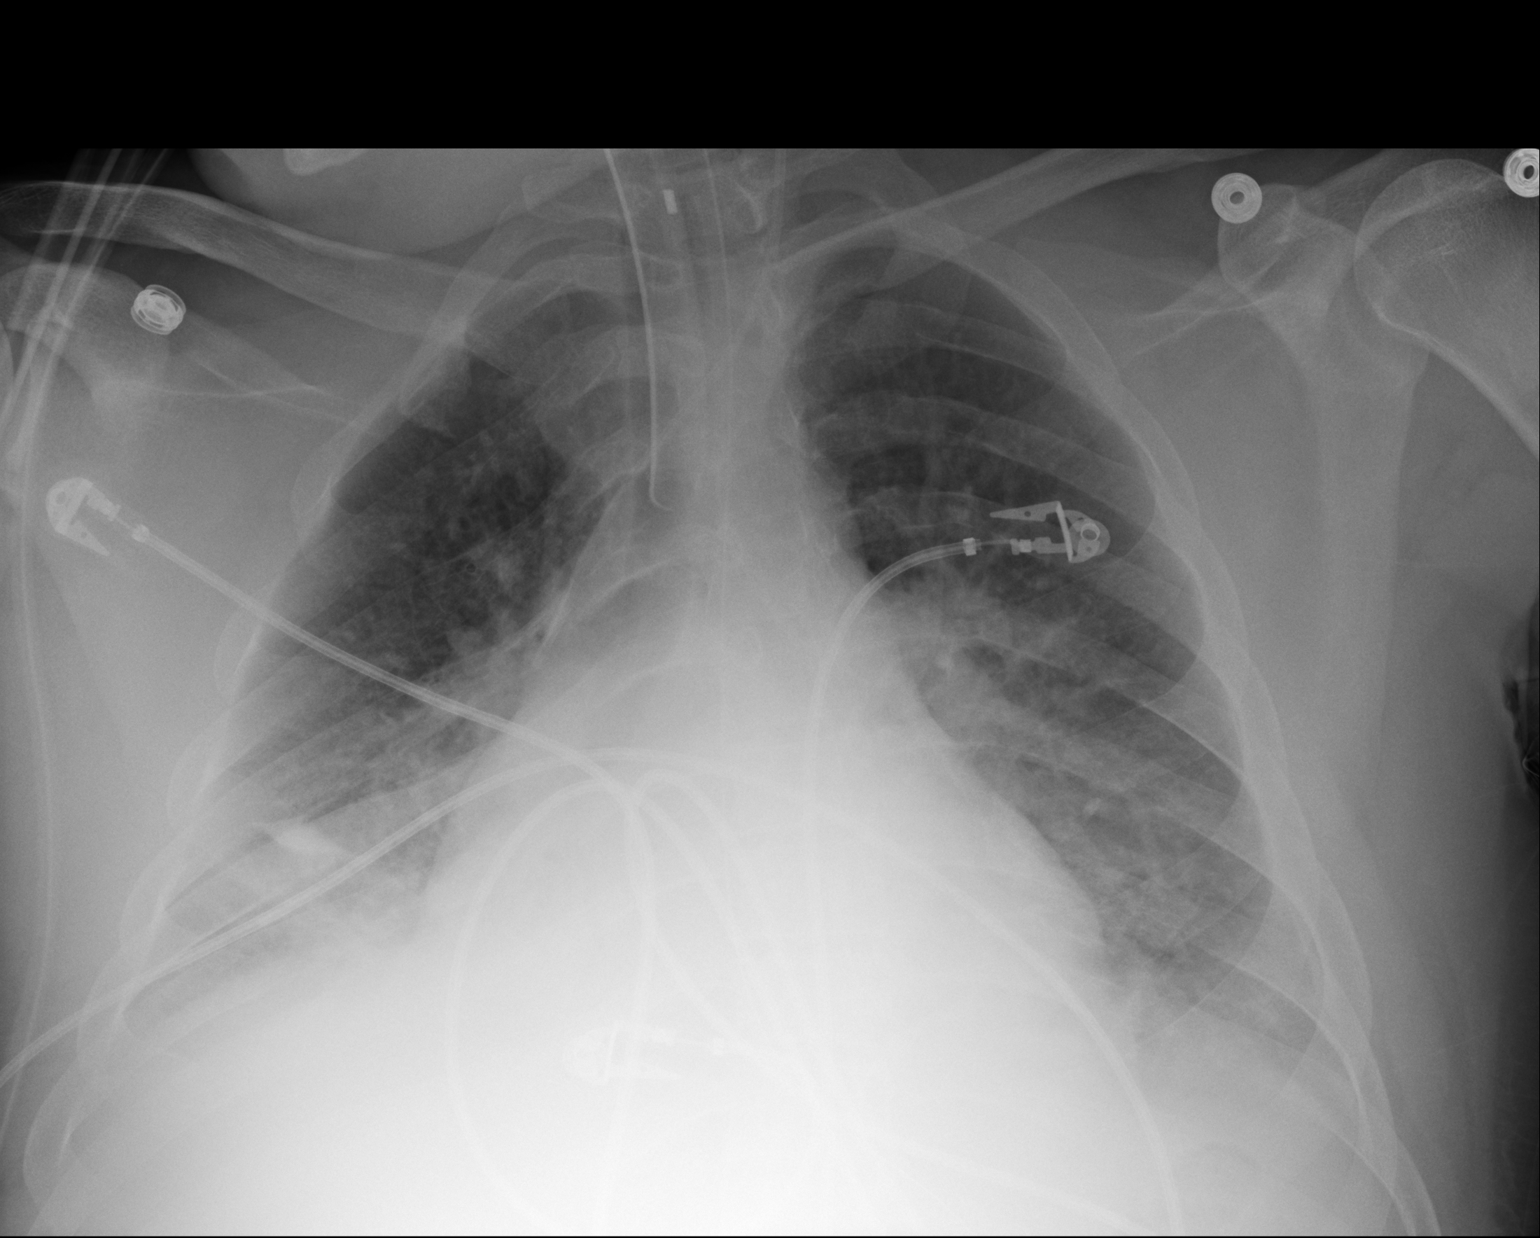

[1 of 1 positions shown; findings below may reference images not displayed]

FINDINGS: Left-sided central venous line, endotracheal tube and nasogastric
catheter are again identified and stable in appearance. Improved
aeration in the right lung base is noted although some persistent
density is seen. Increasing left basilar infiltrate is noted when
compare with the prior study.
IMPRESSION: Waxing and waning bibasilar airspace disease. Continued followup is
recommended.

## 2015-06-01 IMAGING — CR DG CHEST 1V PORT
1 series · 1 of 1 positions shown · non-contrast
Comparison: 02/04/2014

CLINICAL DATA: PICC line placement.

EXAM:
PORTABLE CHEST - 1 VIEW

[ap]
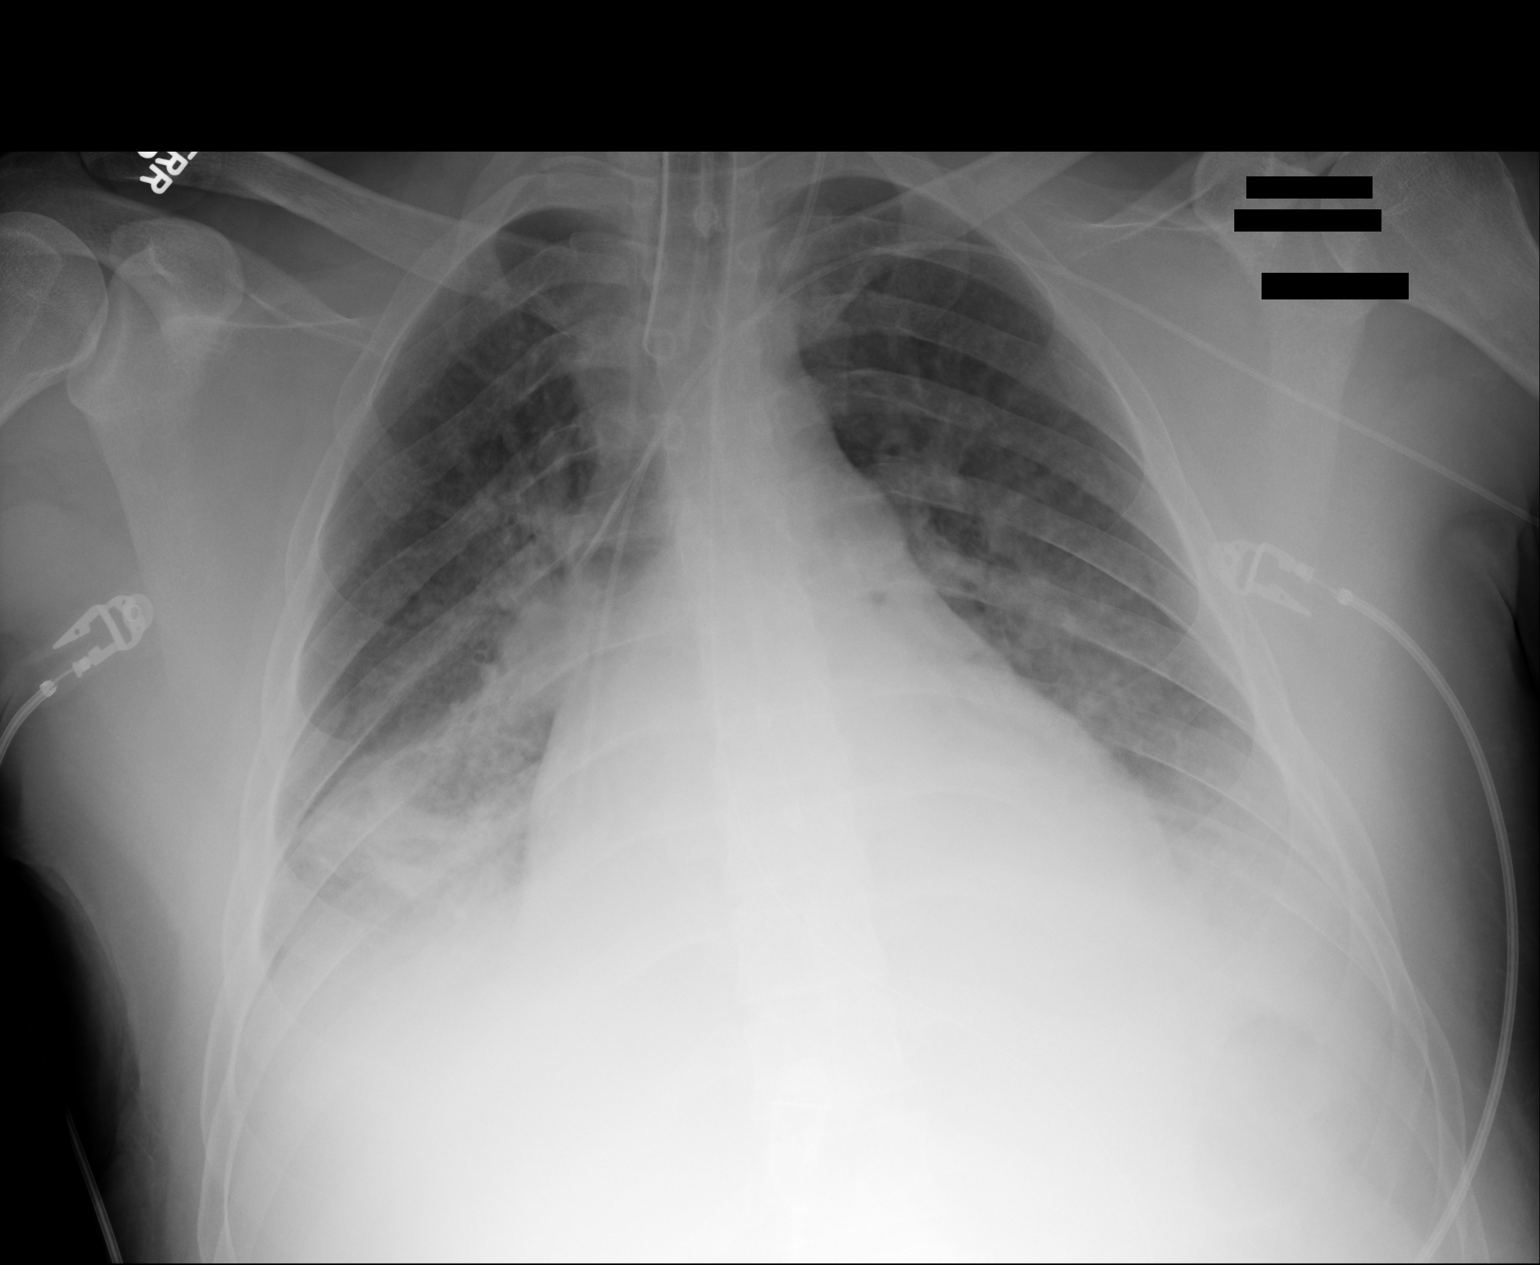

[1 of 1 positions shown; findings below may reference images not displayed]

FINDINGS: Endotracheal tube has tip 4.9 cm above the carina. Left IJ central
venous catheter is unchanged. Nasogastric tube courses to the region
of the stomach and off the inferior portion of the film. Left-sided
PICC line is present with tip just below the cavoatrial junction
within the right atrium. Lungs are adequately inflated with
continued perihilar and bibasilar opacification which may represent
interstitial edema with bilateral effusions/ atelectasis. Cannot
exclude infection. There stable cardiomegaly.
IMPRESSION: Persistent bilateral perihilar and bibasilar opacification which may
represent interstitial edema with bilateral effusions/ atelectasis,
although cannot exclude infection.

Tubes and lines as described.

## 2015-06-01 IMAGING — CR DG CHEST 1V PORT
1 series · 1 of 1 positions shown · non-contrast
Comparison: Earlier same day as well as 06/12/2011

CLINICAL DATA: PICC line placement.

EXAM:
PORTABLE CHEST - 1 VIEW

[ap]
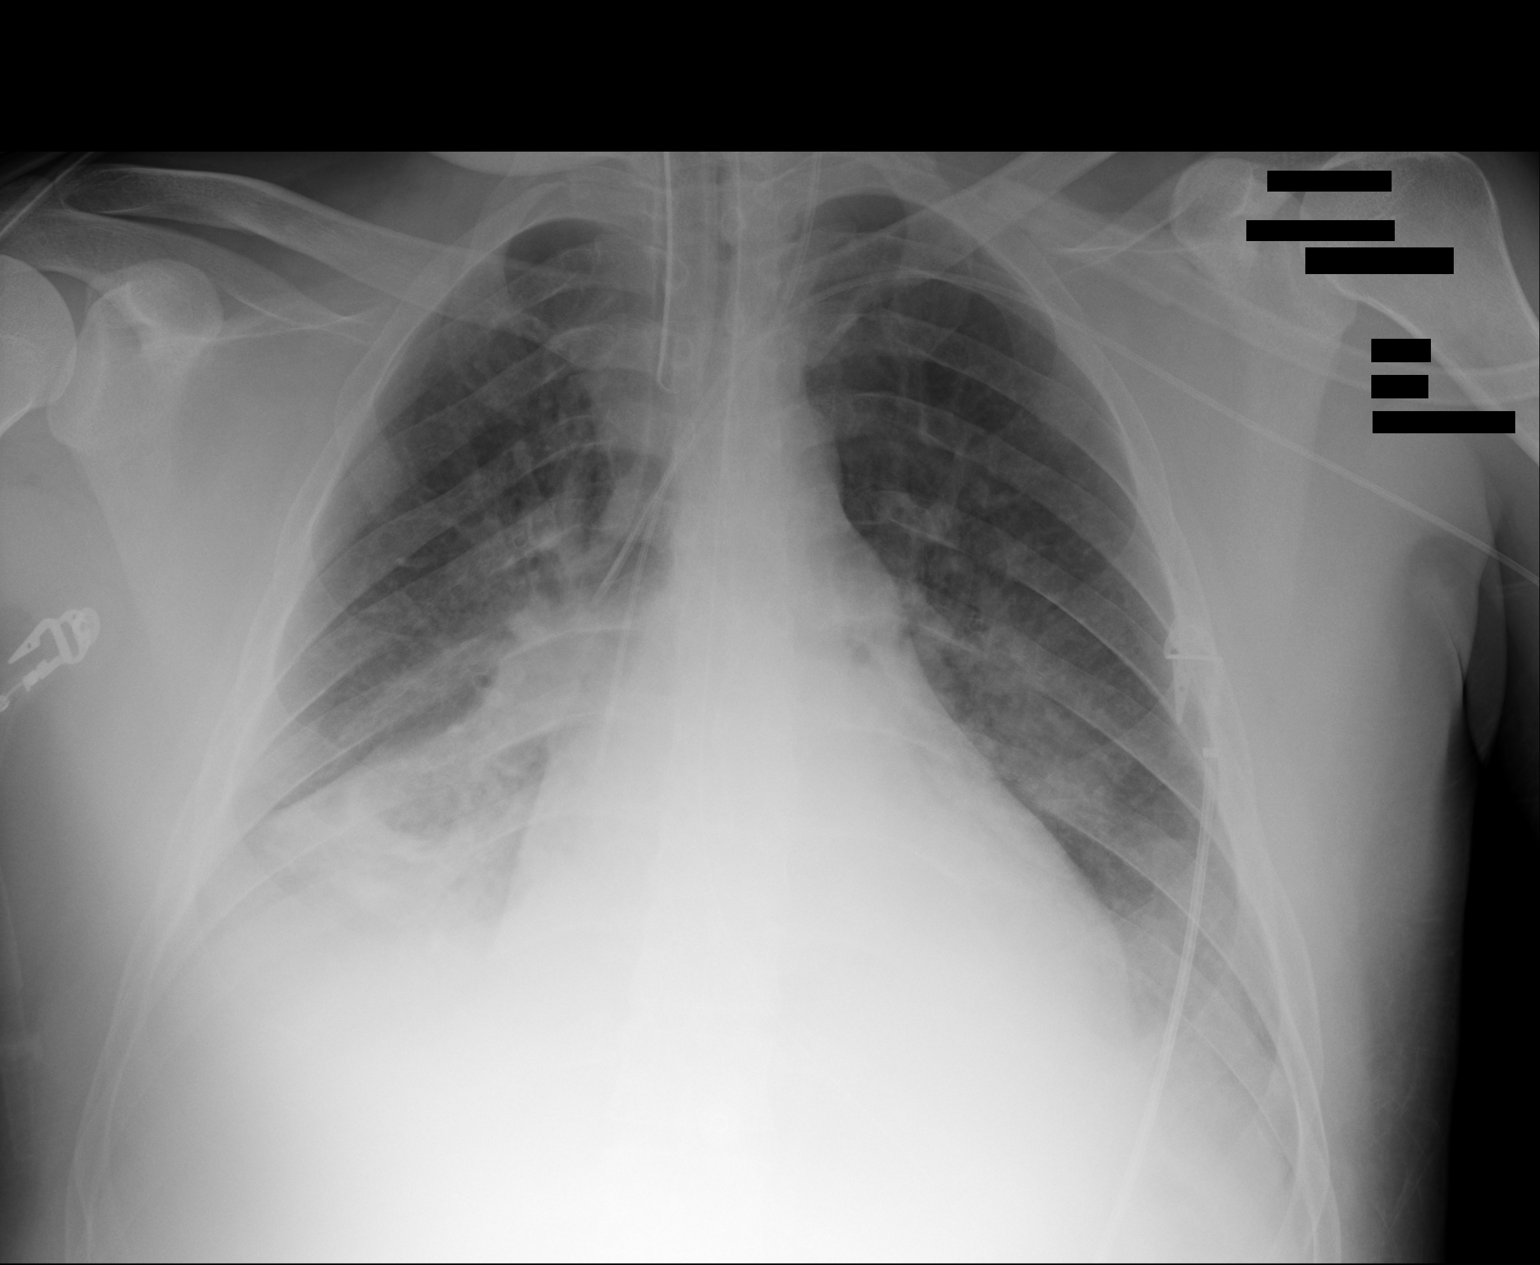

[1 of 1 positions shown; findings below may reference images not displayed]

FINDINGS: Endotracheal tube, enteric tube, left IJ central venous catheter and
right-sided PICC line are unchanged. There is no change in
persistent bilateral perihilar and bibasilar opacification likely
representing interstitial edema with small bilateral effusions and
atelectasis. Cannot exclude infection lung bases. Mild stable
cardiomegaly. Remainder the exam is unchanged.
IMPRESSION: Persistent bilateral perihilar bibasilar opacification unchanged
likely representing interstitial edema with small bilateral
effusions atelectasis. Cannot exclude infection in the lung bases.

Tubes and lines unchanged.

## 2015-06-02 IMAGING — CT CT HEAD WITHOUT CONTRAST
1 series · 16 of 30 positions shown, 20 images · non-contrast
Comparison: 01/21/2014

CLINICAL DATA: Intubated, sedated.  Severe hypoxia.

EXAM:
CT HEAD WITHOUT CONTRAST
TECHNIQUE: Contiguous axial images were obtained from the base of the skull
through the vertex without intravenous contrast.

[Series 2: head wo · axial · 0.41mm/px · z∈[-552,-426]mm · 16 of 32 slices shown, 20 images]
[im 2/32  brain]
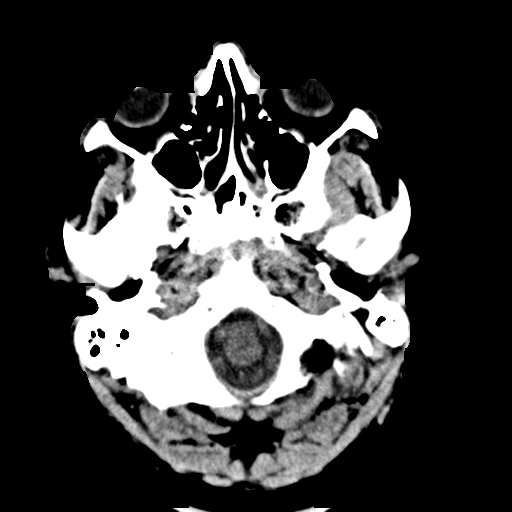
[im 2/32  bone]
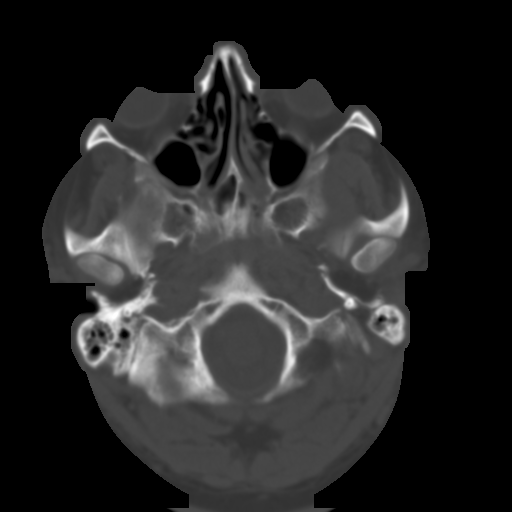
[im 4/32  brain]
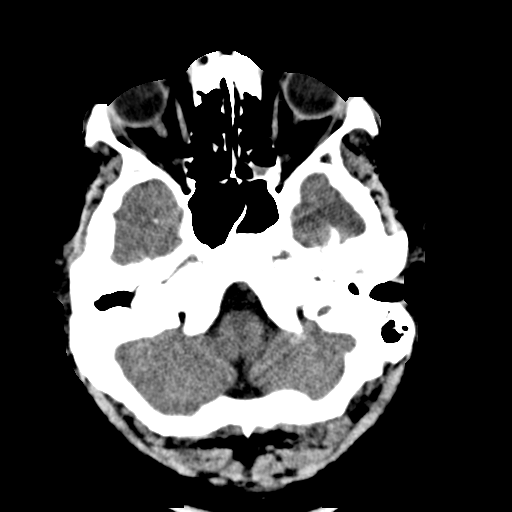
[im 6/32  brain]
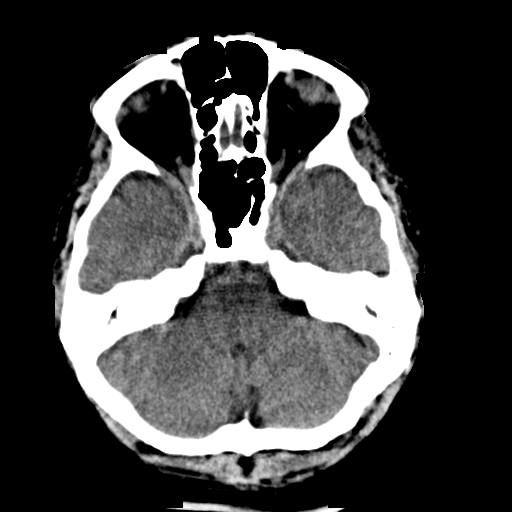
[im 8/32  brain]
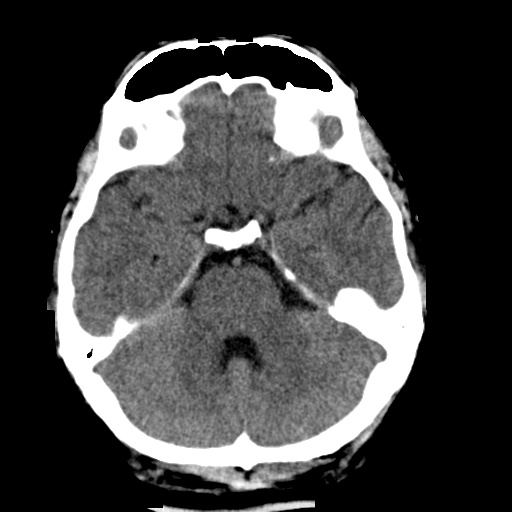
[im 9/32  brain]
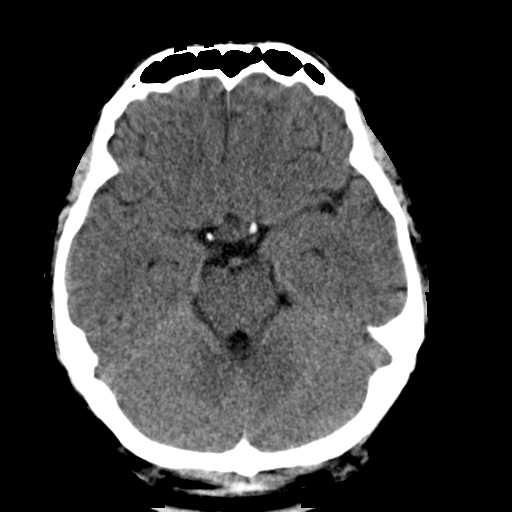
[im 9/32  bone]
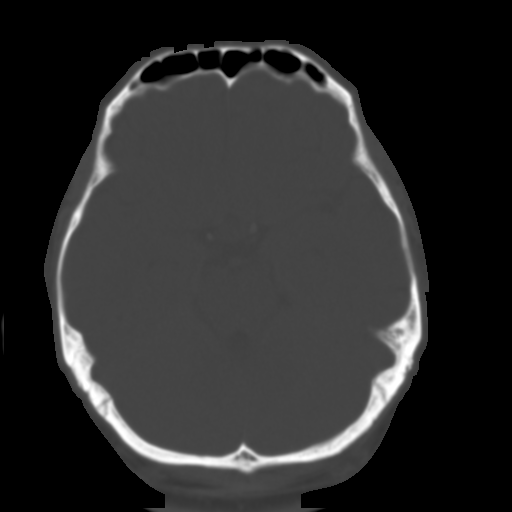
[im 11/32  brain]
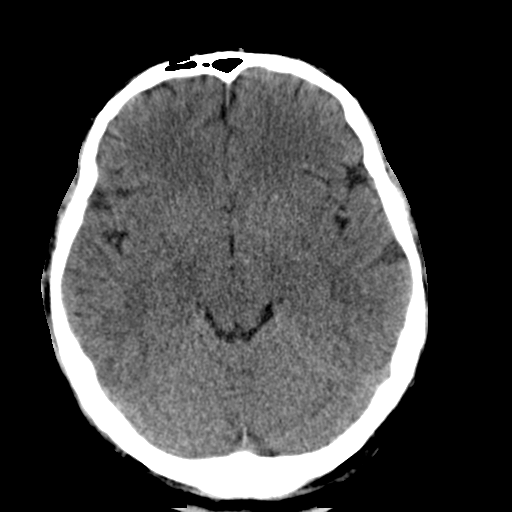
[im 13/32  brain]
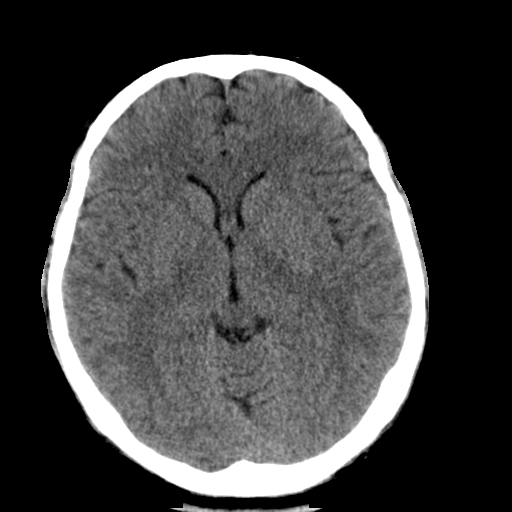
[im 15/32  brain]
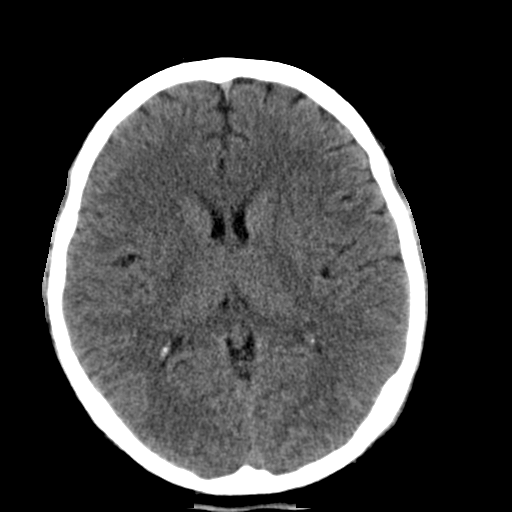
[im 17/32  brain]
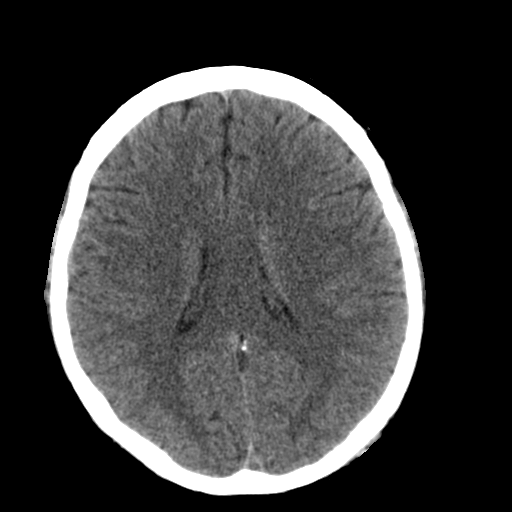
[im 17/32  bone]
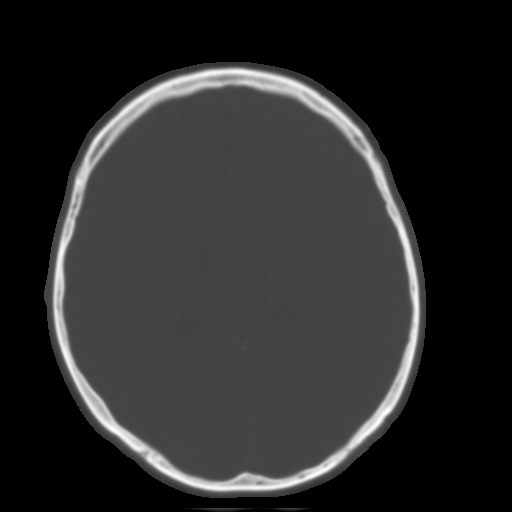
[im 19/32  brain]
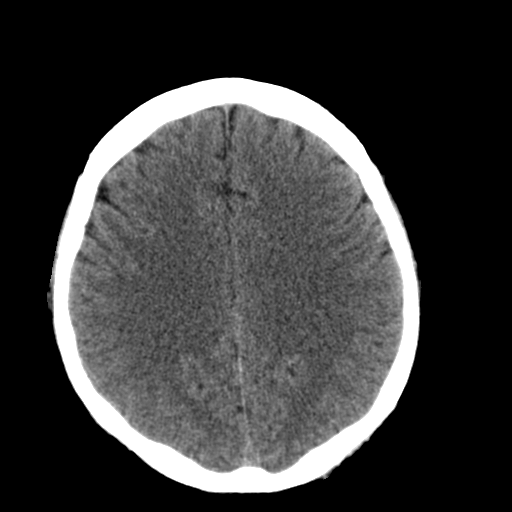
[im 21/32  brain]
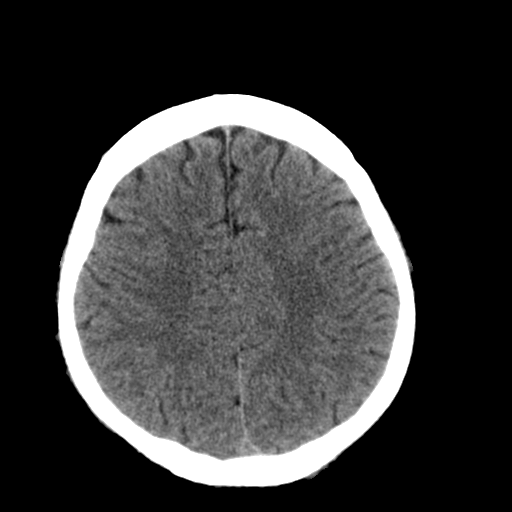
[im 23/32  brain]
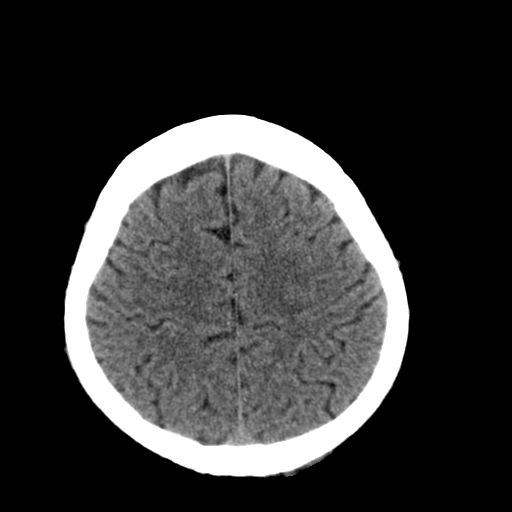
[im 24/32  brain]
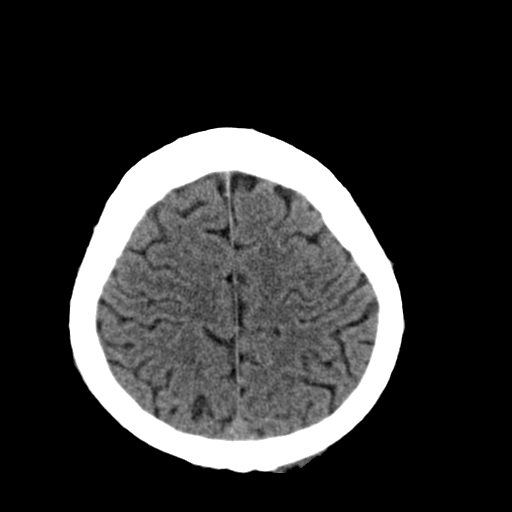
[im 24/32  bone]
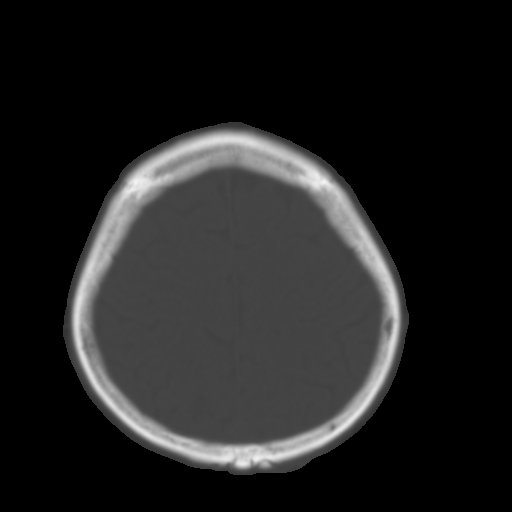
[im 26/32  brain]
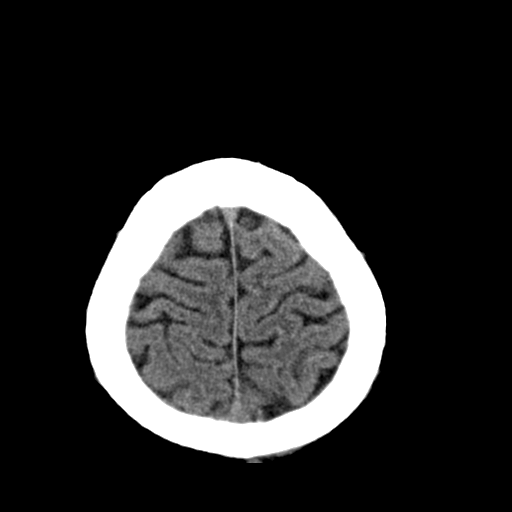
[im 28/32  brain]
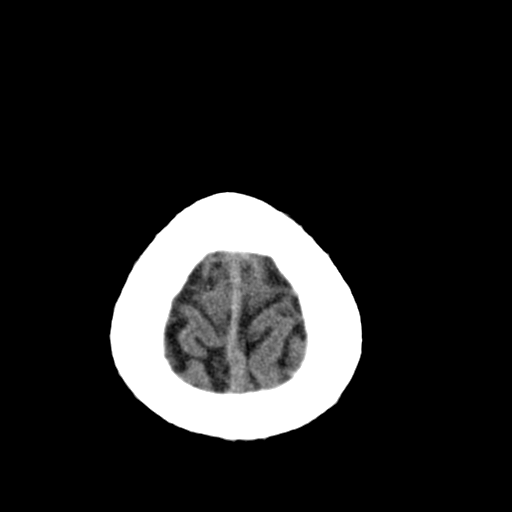
[im 30/32  brain]
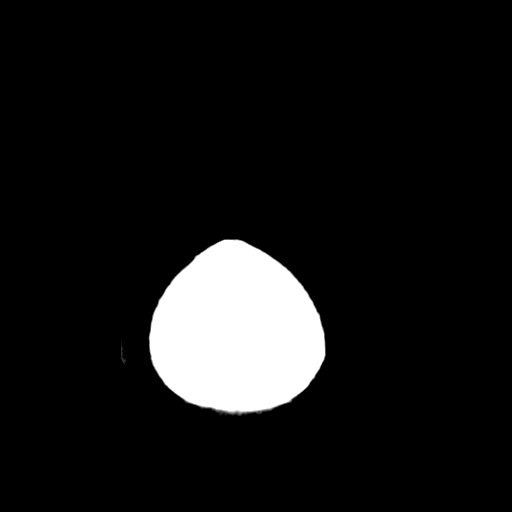

[16 of 30 positions shown; findings below may reference images not displayed]

FINDINGS: No acute intracranial abnormality. Specifically, no hemorrhage,
hydrocephalus, mass lesion, acute infarction, or significant
intracranial injury. No acute calvarial abnormality.
IMPRESSION: No acute intracranial abnormality.

## 2015-07-29 ENCOUNTER — Emergency Department
Admission: EM | Admit: 2015-07-29 | Discharge: 2015-07-29 | Disposition: A | Discharge status: Home / Self Care | Payer: Self-pay | Attending: Emergency Medicine | Admitting: Emergency Medicine

## 2015-07-29 DIAGNOSIS — Z720 Tobacco use: Secondary | ICD-10-CM | POA: Insufficient documentation

## 2015-07-29 DIAGNOSIS — Y998 Other external cause status: Secondary | ICD-10-CM | POA: Insufficient documentation

## 2015-07-29 DIAGNOSIS — Y9389 Activity, other specified: Secondary | ICD-10-CM | POA: Insufficient documentation

## 2015-07-29 DIAGNOSIS — Y9289 Other specified places as the place of occurrence of the external cause: Secondary | ICD-10-CM | POA: Insufficient documentation

## 2015-07-29 DIAGNOSIS — T50904A Poisoning by unspecified drugs, medicaments and biological substances, undetermined, initial encounter: Secondary | ICD-10-CM

## 2015-07-29 DIAGNOSIS — T402X1A Poisoning by other opioids, accidental (unintentional), initial encounter: Secondary | ICD-10-CM | POA: Insufficient documentation

## 2015-07-29 DIAGNOSIS — Z792 Long term (current) use of antibiotics: Secondary | ICD-10-CM | POA: Insufficient documentation

## 2015-07-29 NOTE — ED Notes (Signed)
Patient brought in via EMS for overdose on heroin.  Patient received narcan SQ via EMS.  Patient very agitated and refusing treatment.  Dr. Derrill Kay at bedside to assess patient. Dr. Derrill Kay explained to patient danger of leaving against medical advice.  Patient stated " I understand".  Dr. Derrill Kay explained to patient that the narcan that was given to reverse the effects of heroin overdose may wear off before the heroin does.  Patient was able to re-state information and left AMA.  Patient refused to sign any paper work.

## 2015-07-29 NOTE — ED Provider Notes (Signed)
[facility] Emergency Department Provider Note    ____________________________________________  Time seen: On EMS arrival  I have reviewed the triage vital signs and the nursing notes.   HISTORY  Chief Complaint Ingestion   History limited by: Not Limited   HPI Edwin [name] is a 29 y.o. male who presents to the emergency department via EMS. The patient was found to have decreased respirations by his family when he left to grab something from the store. When the first responders arrived they also noticed decreased respirations. The patient was given intranasal Narcan with good response patient the patient himself does admit to using 30 mg of Roxicet. He states that he smoked them. He denies any intention of self-harm. He states he was trying to get high. Patient states he would like to go home. Patient denies any medical complaints at this time.    No past medical history on file.  There are no active problems to display for this patient.   Past Surgical History  Procedure Laterality Date  . Dental surgery      Current Outpatient Rx  Name  Route  Sig  Dispense  Refill  . amoxicillin (AMOXIL) 500 MG capsule   Oral   Take 1 capsule (500 mg total) by mouth 3 (three) times daily. For 10 days   30 capsule   0   . ibuprofen (ADVIL,MOTRIN) 200 MG tablet   Oral   Take 800 mg by mouth every 6 (six) hours as needed. pain          . traMADol (ULTRAM) 50 MG tablet   Oral   Take 1 tablet (50 mg total) by mouth every 6 (six) hours as needed for pain.   20 tablet   0     Allergies Vicodin  No family history on file.  Social History Social History  Substance Use Topics  . Smoking status: Current Every Day Smoker  . Smokeless tobacco: Not on file  . Alcohol Use: Yes    Review of Systems Patient left before full review of systems could be obtained  ____________________________________________   PHYSICAL EXAM:  VITAL SIGNS:  The  patient left before vital signs could be obtained  Constitutional: Alert and oriented. Well appearing and in no distress. Eyes: Conjunctivae are normal. PERRL. Normal extraocular movements. ENT   Head: Normocephalic and atraumatic.   Nose: No congestion/rhinnorhea.   Mouth/Throat: Mucous membranes are moist.   Neck: No stridor. Respiratory: Normal respiratory effort without tachypnea nor retractions.  Musculoskeletal: Normal range of motion in all extremities.  Neurologic:  Normal speech and language. No gross focal neurologic deficits are appreciated. Speech is normal.  Patient left before full physical exam could be obtained ____________________________________________    LABS (pertinent positives/negatives)  None  ____________________________________________   EKG  None  ____________________________________________    RADIOLOGY  None    ____________________________________________   PROCEDURES  Procedure(s) performed: None  Critical Care performed: No  ____________________________________________   INITIAL IMPRESSION / ASSESSMENT AND PLAN / ED COURSE  Pertinent labs & imaging results that were available during my care of the patient were reviewed by me and considered in my medical decision making (see chart for details).  Patient brought in via EMS after a opiate overdose and necessitating Narcan. Upon arrival patient awake alert, patient refusing treatment. I did discuss with patient the more concerned that if the Narcan wore off he might have a relapse of his respiratory difficulties. I discussed with patient that if  he stopped breathing complete to death. Patient was able to verbalize his understanding that if he was to get worse again he could die. This point patient denies any SI. I do not feel that patient is committable. Patient will leave AMA  ____________________________________________   FINAL CLINICAL IMPRESSION(S) / ED  DIAGNOSES  Opioid overdose  Phineas Semen, MD 07/29/15 [phone]

## 2015-11-27 ENCOUNTER — Encounter: Payer: Self-pay | Admitting: Emergency Medicine

## 2015-11-27 ENCOUNTER — Emergency Department
Admission: EM | Admit: 2015-11-27 | Discharge: 2015-11-30 | Disposition: A | Discharge status: Other Acute Inpatient Hospital | Payer: Self-pay | Attending: Emergency Medicine | Admitting: Emergency Medicine

## 2015-11-27 DIAGNOSIS — T40604A Poisoning by unspecified narcotics, undetermined, initial encounter: Secondary | ICD-10-CM

## 2015-11-27 DIAGNOSIS — F1910 Other psychoactive substance abuse, uncomplicated: Secondary | ICD-10-CM

## 2015-11-27 DIAGNOSIS — T40601A Poisoning by unspecified narcotics, accidental (unintentional), initial encounter: Secondary | ICD-10-CM

## 2015-11-27 DIAGNOSIS — R4182 Altered mental status, unspecified: Secondary | ICD-10-CM | POA: Insufficient documentation

## 2015-11-27 DIAGNOSIS — Y9389 Activity, other specified: Secondary | ICD-10-CM | POA: Insufficient documentation

## 2015-11-27 DIAGNOSIS — J9601 Acute respiratory failure with hypoxia: Secondary | ICD-10-CM | POA: Insufficient documentation

## 2015-11-27 DIAGNOSIS — T507X4A Poisoning by analeptics and opioid receptor antagonists, undetermined, initial encounter: Secondary | ICD-10-CM | POA: Insufficient documentation

## 2015-11-27 DIAGNOSIS — F1310 Sedative, hypnotic or anxiolytic abuse, uncomplicated: Secondary | ICD-10-CM | POA: Insufficient documentation

## 2015-11-27 DIAGNOSIS — Y998 Other external cause status: Secondary | ICD-10-CM | POA: Insufficient documentation

## 2015-11-27 DIAGNOSIS — F1210 Cannabis abuse, uncomplicated: Secondary | ICD-10-CM | POA: Insufficient documentation

## 2015-11-27 DIAGNOSIS — Y9289 Other specified places as the place of occurrence of the external cause: Secondary | ICD-10-CM | POA: Insufficient documentation

## 2015-11-27 DIAGNOSIS — F1410 Cocaine abuse, uncomplicated: Secondary | ICD-10-CM | POA: Insufficient documentation

## 2015-11-27 DIAGNOSIS — F17200 Nicotine dependence, unspecified, uncomplicated: Secondary | ICD-10-CM | POA: Insufficient documentation

## 2015-11-27 LAB — COMPREHENSIVE METABOLIC PANEL
ALK PHOS: 42 U/L (ref 38–126)
ALT: 19 U/L (ref 17–63)
AST: 26 U/L (ref 15–41)
Albumin: 4.1 g/dL (ref 3.5–5.0)
Anion gap: 12 (ref 5–15)
BUN: 21 mg/dL — AB (ref 6–20)
CALCIUM: 8.2 mg/dL — AB (ref 8.9–10.3)
CO2: 19 mmol/L — ABNORMAL LOW (ref 22–32)
CREATININE: 1.39 mg/dL — AB (ref 0.61–1.24)
Chloride: 105 mmol/L (ref 101–111)
GFR calc Af Amer: >60 mL/min (ref >60)
GFR calc non Af Amer: >60 mL/min (ref >60)
Glucose, Bld: 194 mg/dL — ABNORMAL HIGH (ref 65–99)
Potassium: 4.3 mmol/L (ref 3.5–5.1)
SODIUM: 136 mmol/L (ref 135–145)
Total Bilirubin: 0.5 mg/dL (ref 0.3–1.2)
Total Protein: 6.8 g/dL (ref 6.5–8.1)

## 2015-11-27 LAB — CBC
HCT: 40.6 % (ref 40.0–52.0)
HEMOGLOBIN: 13.6 g/dL (ref 13.0–18.0)
MCH: 30.7 pg (ref 26.0–34.0)
MCHC: 33.5 g/dL (ref 32.0–36.0)
MCV: 91.8 fL (ref 80.0–100.0)
PLATELETS: 246 10*3/uL (ref 150–440)
RBC: 4.43 MIL/uL (ref 4.40–5.90)
RDW: 13.2 % (ref 11.5–14.5)
WBC: 20.9 10*3/uL — ABNORMAL HIGH (ref 3.8–10.6)

## 2015-11-27 LAB — URINE DRUG SCREEN, QUALITATIVE (ARMC ONLY)
Amphetamines, Ur Screen: NOT DETECTED
BARBITURATES, UR SCREEN: NOT DETECTED
Benzodiazepine, Ur Scrn: POSITIVE — AB
CANNABINOID 50 NG, UR ~~LOC~~: POSITIVE — AB
COCAINE METABOLITE, UR ~~LOC~~: POSITIVE — AB
MDMA (Ecstasy)Ur Screen: NOT DETECTED
Methadone Scn, Ur: NOT DETECTED
Opiate, Ur Screen: POSITIVE — AB
PHENCYCLIDINE (PCP) UR S: NOT DETECTED
TRICYCLIC, UR SCREEN: NOT DETECTED

## 2015-11-27 LAB — ACETAMINOPHEN LEVEL: Acetaminophen (Tylenol), Serum: <10 ug/mL — ABNORMAL LOW (ref 10–30)

## 2015-11-27 LAB — SALICYLATE LEVEL

## 2015-11-27 LAB — ETHANOL

## 2015-11-27 LAB — GLUCOSE, CAPILLARY: Glucose-Capillary: 98 mg/dL (ref 65–99)

## 2015-11-27 NOTE — ED Provider Notes (Signed)
[facility] Emergency Department Provider Note  ____________________________________________  Time seen: On arrival at 1510  I have reviewed the triage vital signs and the nursing notes.  History by:  Report from both police and EMS. Some information from patient, though this was limited.  HISTORY  Chief Complaint Drug Overdose     HPI BURNETTE SAUTTER is a 30 y.o. male who has been brought to the emergency department due to an unresponsive episode and subsequent combativeness status post Narcan use.  Paramedics report they believe the patient was abusing fentanyl. I'm told he has a long history of substance abuse as well as some violent behavior. It is unclear but I think the patient's grandmother, who the patient lives with, called 911 areas on arrival, the patient had been brought under the hall, had sonorous respirations and was essentially unresponsive. EMS reports his oxygen saturation level was 71%. The patient was given 2 mg of Narcan intranasally. The paramedics report there was no significant change. They began an IV and then gave the patient 1 mg of Narcan. The patient began to wake up and was somewhat combative. His oxygen saturation levels were waxing and waning.EMS gave an additional 1 mg of Narcan. The patient woke up further and was increasingly combative. Due to his agitation and combativeness, EMS had called and received orders for 5 mg of Haldol. This was administered followed by 2 mg of Versed. They report they were wrestling with the patient for much of the ride to the hospital. Upon arrival, the patient appeared more calm and somewhat walk or if. He does present with some slurred and impaired speech. He confirms that he was doing fentanyl, but he is unable to articulate in what manner. He denies IV or IM drug abuse today.    History reviewed. No pertinent past medical history.  There are no active problems to display for this patient.   Past  Surgical History  Procedure Laterality Date  . Dental surgery      Current Outpatient Rx  Name  Route  Sig  Dispense  Refill  . ibuprofen (ADVIL,MOTRIN) 200 MG tablet   Oral   Take 800 mg by mouth every 6 (six) hours as needed for mild pain.            Allergies Vicodin  History reviewed. No pertinent family history.  Social History Social History  Substance Use Topics  . Smoking status: Current Every Day Smoker  . Smokeless tobacco: None  . Alcohol Use: Yes    Review of Systems Unable to perform review of systems due to the patient's mental status, status post overdose and now status post sedating medication.  ____________________________________________   PHYSICAL EXAM:  VITAL SIGNS: ED Triage Vitals  Enc Vitals Group     BP --      Pulse --      Resp --      Temp --      Temp src --      SpO2 --      Weight --      Height --      Head Cir --      Peak Flow --      Pain Score --      Pain Loc --      Pain Edu? --      Excl. in GC? --     Constitutional:  Patient appears somewhat impaired, dazed, with eyes open and some interaction, but unable to  speak clearly and articulately at this time. He does follow simple commands. ENT   Head: Normocephalic and atraumatic.   Nose: No congestion/rhinnorhea.       Mouth: No erythema, no swelling   Cardiovascular: Normal rate, regular rhythm, no murmur noted Respiratory:  Normal respiratory effort, no tachypnea.    Breath sounds are clear and equal bilaterally.  Gastrointestinal: Soft, no distention. Nontender Back: No muscle spasm, no tenderness, no CVA tenderness. Musculoskeletal: No deformity noted. Nontender with normal range of motion in all extremities.  No noted edema. Neurologic:  Limited communication, slurred speech. Spontaneous movement in all 4 extremities. Patient does follow commands and is able to raise the limbs independently when asked. Skin:  Skin is warm, dry. No rash  noted. Psychiatric: Eyes open, responding to questions and voiced, but with slurred speech, and articulate speech. Significant concerns for overdose today and prior history, I'm told, of substance abuse. See history of present illness. ____________________________________________    LABS (pertinent positives/negatives)  Labs Reviewed  COMPREHENSIVE METABOLIC PANEL - Abnormal; Notable for the following:    CO2 19 (*)    Glucose, Bld 194 (*)    BUN 21 (*)    Creatinine, Ser 1.39 (*)    Calcium 8.2 (*)    All other components within normal limits  ACETAMINOPHEN LEVEL - Abnormal; Notable for the following:    Acetaminophen (Tylenol), Serum <10 (*)    All other components within normal limits  CBC - Abnormal; Notable for the following:    WBC 20.9 (*)    All other components within normal limits  URINE DRUG SCREEN, QUALITATIVE ([facility] ONLY) - Abnormal; Notable for the following:    Cocaine Metabolite,Ur [place] POSITIVE (*)    Opiate, Ur Screen POSITIVE (*)    Cannabinoid 50 Ng, Ur [place] POSITIVE (*)    Benzodiazepine, Ur Scrn POSITIVE (*)    All other components within normal limits  ETHANOL  SALICYLATE LEVEL  CBG MONITORING, ED     ____________________________________________  PROCEDURES    ____________________________________________   INITIAL IMPRESSION / ASSESSMENT AND PLAN / ED COURSE  Pertinent labs & imaging results that were available during my care of the patient were reviewed by me and considered in my medical decision making (see chart for details).  30 year old male, unresponsive and likely, then sonorous respirations, at home. Increase awareness and agitation after Narcan, but combative. Now calm and reasonable after Haldol and Versed. We will continue to monitor him closely in case the nausea and begins to wear off and the drug he used to overdose begins to sedate him additionally. I understand his grandmother is in route with an involuntary commitment order. We will  endorse the IVC as the patient appears to be a danger to himself.  ----------------------------------------- 6:00 PM on 11/27/2015 -----------------------------------------  Patient is more alert and appropriate now. He seems to lack of recall about the events earlier. We will continue the IVC as noted above to observe him overnight and have him seen by psychiatry.  ----------------------------------------- 11:23 AM on 11/28/2015 -----------------------------------------  We rechecked the patient's blood sugar. It was 89. I reexamined the patient. He was alert and communicative. He was ambulatory to the behavioral health unit. We will observe him overnight. I've asked my colleague, Dr. York Cerise to seem care at this time.   ____________________________________________   FINAL CLINICAL IMPRESSION(S) / ED DIAGNOSES  Final diagnoses:  Substance abuse  Narcotic overdose, undetermined intent, initial encounter  Acute respiratory failure with hypoxia (HCC)  Darien Ramus, MD 11/28/15 [phone]

## 2015-11-27 NOTE — BH Assessment (Signed)
Assessment Note  Edwin [name] is an 30 y.o. male presenting to the ED via EMS after being found unresponsive.  According to EMS, the patient was abusing fentanyl.  In talking with patient, he was still somewhat sedated.  He did however, state that he was not intentionally trying to kill himself.  Patient was still somewhat confused about why he is here in the hospital and does not remember anything before coming here.  Pt denies any psychiatric hospitalization in the past.  Diagnosis: Drug overdose  Past Medical History: History reviewed. No pertinent past medical history.  Past Surgical History  Procedure Laterality Date  . Dental surgery      Family History: History reviewed. No pertinent family history.  Social History:  reports that he has been smoking.  He does not have any smokeless tobacco history on file. He reports that he drinks alcohol. He reports that he does not use illicit drugs.  Additional Social History:  Alcohol / Drug Use History of alcohol / drug use?: Yes (Pt has a history of SA abuse but was unable to completely answer  questions.)  CIWA: CIWA-Ar BP: 112/64 mmHg Pulse Rate: 93 COWS:    Allergies:  Allergies  Allergen Reactions  . Vicodin [Hydrocodone-Acetaminophen] Other (See Comments)    Reaction:  GI upset     Home Medications:  (Not in a hospital admission)  OB/GYN Status:  No LMP for male patient.  General Assessment Data Location of Assessment: [facility] ED TTS Assessment: In system Is this a Tele or Face-to-Face Assessment?: Face-to-Face Is this an Initial Assessment or a Re-assessment for this encounter?: Initial Assessment Marital status: Single Maiden name: N/A Is patient pregnant?: No Pregnancy Status: No Living Arrangements: Parent Can pt return to current living arrangement?: Yes Admission Status: Involuntary Is patient capable of signing voluntary admission?: Yes Referral Source: Self/Family/Friend Insurance type: None  Medical  Screening Exam [facility] Walk-in ONLY) Medical Exam completed: Yes  Crisis Care Plan Living Arrangements: Parent Legal Guardian: Other: (self) Name of Psychiatrist: [facility] Name of Therapist: Verta Ellen  Education Status Is patient currently in school?: Yes Current Grade: 10th Highest grade of school patient has completed: 9th Name of school: [employer] person: Jabier Mutton  Risk to self with the past 6 months Suicidal Ideation: No Has patient been a risk to self within the past 6 months prior to admission? : No Suicidal Intent: No Has patient had any suicidal intent within the past 6 months prior to admission? : No Is patient at risk for suicide?: No Suicidal Plan?: No Has patient had any suicidal plan within the past 6 months prior to admission? : No Access to Means: No What has been your use of drugs/alcohol within the last 12 months?: Cocaine, Opiates, THC, Benzo Previous Attempts/Gestures: No How many times?: 0 Other Self Harm Risks: None identified Triggers for Past Attempts: None known Intentional Self Injurious Behavior: None Family Suicide History: No Recent stressful life event(s): Other (Comment) Persecutory voices/beliefs?: No Substance abuse history and/or treatment for substance abuse?: Yes Suicide prevention information given to non-admitted patients: Not applicable  Risk to Others within the past 6 months Homicidal Ideation: No Does patient have any lifetime risk of violence toward others beyond the six months prior to admission? : No Thoughts of Harm to Others: No Current Homicidal Intent: No Current Homicidal Plan: No Access to Homicidal Means: No Identified Victim: None identified History of harm to others?: No Assessment of Violence: None Noted Violent Behavior Description: None  reported Does patient have access to weapons?: No Criminal Charges Pending?: No Does patient have a court date: No Is patient on probation?:  No  Psychosis Hallucinations: None noted Delusions: None noted  Mental Status Report Appearance/Hygiene: In scrubs Eye Contact: Good Motor Activity: Freedom of movement Speech: Slurred Level of Consciousness: Drowsy, Sedated Mood: Anxious Affect: Appropriate to circumstance Anxiety Level: Minimal Thought Processes: Circumstantial Judgement: Partial Orientation: Person, Place Obsessive Compulsive Thoughts/Behaviors: None  Cognitive Functioning Concentration: Normal Memory: Recent Impaired IQ: Average Insight: Fair Impulse Control: Fair Appetite: Fair Weight Loss: 0 Weight Gain: 0 Sleep: No Change Total Hours of Sleep: 8 Vegetative Symptoms: None  ADLScreening [facility] Assessment Services) Patient's cognitive ability adequate to safely complete daily activities?: Yes Patient able to express need for assistance with ADLs?: Yes Independently performs ADLs?: Yes (appropriate for developmental age)  Prior Inpatient Therapy Prior Inpatient Therapy: No Prior Therapy Dates: N/A Prior Therapy Facilty/Provider(s): N/A Reason for Treatment: N/A  Prior Outpatient Therapy Prior Outpatient Therapy: No Prior Therapy Dates: N/A Prior Therapy Facilty/Provider(s): N/a Reason for Treatment: N/A Does patient have an ACCT team?: No Does patient have Intensive In-House Services?  : No Does patient have [facility] services? : No Does patient have P4CC services?: No  ADL Screening (condition at time of admission) Patient's cognitive ability adequate to safely complete daily activities?: Yes Patient able to express need for assistance with ADLs?: Yes Independently performs ADLs?: Yes (appropriate for developmental age)       Abuse/Neglect Assessment (Assessment to be complete while patient is alone) Physical Abuse: Denies Verbal Abuse: Denies Sexual Abuse: Denies Exploitation of patient/patient's resources: Denies Self-Neglect: Denies Values / Beliefs Cultural Requests During  Hospitalization: None Spiritual Requests During Hospitalization: None Consults Spiritual Care Consult Needed: No Social Work Consult Needed: No Merchant navy officer (For Healthcare) Does patient have an advance directive?: No Would patient like information on creating an advanced directive?: No - patient declined information    Additional Information 1:1 In Past 12 Months?: No CIRT Risk: No Elopement Risk: No Does patient have medical clearance?: No     Disposition:  Disposition Initial Assessment Completed for this Encounter: Yes Disposition of Patient: Other dispositions Other disposition(s): Other (Comment) (Pending)  On Site Evaluation by:   Reviewed with Physician:    [place] 11/27/2015 10:11 PM

## 2015-11-27 NOTE — ED Notes (Signed)
Pt presents to ED via EMS for drug overdose on fentanyl of unknown amount. Pt reports he was trying to get high. Pt passed and desat. EMS given  narcan,  haldol,  verced.

## 2015-11-28 DIAGNOSIS — F1910 Other psychoactive substance abuse, uncomplicated: Secondary | ICD-10-CM | POA: Insufficient documentation

## 2015-11-28 DIAGNOSIS — T40601A Poisoning by unspecified narcotics, accidental (unintentional), initial encounter: Secondary | ICD-10-CM

## 2015-11-28 MED ORDER — LORAZEPAM 1 MG PO TABS
1.0000 mg | ORAL_TABLET | Freq: Two times a day (BID) | ORAL | Status: DC | PRN
  Administered 2015-11-28 – 2015-11-29 (×2): 1 mg via ORAL
  Filled 2015-11-28 (×2): qty 1

## 2015-11-28 NOTE — BH Assessment (Signed)
[place] [facility] Referral Form completed for [facility]  Information faxed to [employer] for Auth #.  Received phone call from [employer] ([phone]) with Authorization/Tracking Number [id number]).  Verbal Screening with [facility] ([id number]) Completed  Referral Information Faxed to [facility] and Confirmed it was received ([id number]).  Patient is pending review at this time.

## 2015-11-28 NOTE — ED Notes (Addendum)
ED BHU PLACEMENT JUSTIFICATION  Is the patient under IVC or is there intent for IVC: Yes.  Is the patient medically cleared: Yes.  Is there vacancy in the ED BHU: Yes.  Is the population mix appropriate for patient: Yes.  Is the patient awaiting placement in inpatient or outpatient setting: Yes.  Has the patient had a psychiatric consult: Yes.  Survey of unit performed for contraband, proper placement and condition of furniture, tampering with fixtures in bathroom, shower, and each patient room: Yes. ; Findings: All clear  APPEARANCE/BEHAVIOR  calm, cooperative and adequate rapport can be established  NEURO ASSESSMENT  Orientation: time, place and person  Hallucinations: No.None noted (Hallucinations)  Speech: Normal  Gait: normal  RESPIRATORY ASSESSMENT  WNL  CARDIOVASCULAR ASSESSMENT  WNL  GASTROINTESTINAL ASSESSMENT  WNL  EXTREMITIES  WNL  PLAN OF CARE  Provide calm/safe environment. Vital signs assessed twice daily. ED BHU Assessment once each 12-hour shift. Collaborate with intake RN daily or as condition indicates. Assure the ED provider has rounded once each shift. Provide and encourage hygiene. Provide redirection as needed. Assess for escalating behavior; address immediately and inform ED provider.  Assess family dynamic and appropriateness for visitation as needed: Yes. ; If necessary, describe findings:  Educate the patient/family about BHU procedures/visitation: Yes. ; If necessary, describe findings: Pt is calm and cooperative at this time. Pt understanding and accepting of unit procedures/rules. Will continue to monitor with Q 15 min safety rounds and observation via security camera. Spoke with pt at length about his use of drugs and pt states he prefers percocet. Pt verbalizes understanding of the fact that he has almost died several times from overdosing. Pt is noted to have an old scar on his throat area and he reports it is from being trached in the past when he overdosed.  Pt says he does not wish to die and states that he wants to get some help with his drug use. Pt understanding of his plan of car and is accepting.

## 2015-11-28 NOTE — ED Notes (Signed)
Pt. Noted in room. No complaints or concerns voiced. No distress or abnormal behavior noted. Will continue to monitor with security cameras. Q 15 minute rounds continue. 

## 2015-11-28 NOTE — ED Notes (Signed)
Pt talked to np on telepsych , he is angry because he is not intrested in drug rehab

## 2015-11-28 NOTE — ED Notes (Signed)
Pt informed he may have the patch or gum but he refused

## 2015-11-28 NOTE — ED Provider Notes (Signed)
-----------------------------------------   6:44 AM on 11/28/2015 -----------------------------------------   Blood pressure 128/81, pulse 82, temperature 98.1 F (36.7 C), temperature source Oral, resp. rate 19, SpO2 98 %.  The patient had no acute events since last update.  Calm and cooperative at this time.  Disposition is pending per Psychiatry/Behavioral Medicine team recommendations.     Loleta Rose, MD 11/28/15 [phone]

## 2015-11-28 NOTE — ED Notes (Signed)
BEHAVIORAL HEALTH ROUNDING Patient sleeping: Yes.   Patient alert and oriented: not applicable SLEEPING Behavior appropriate: Yes.  ; If no, describe: SLEEPING Nutrition and fluids offered: No SLEEPING Toileting and hygiene offered: NoSLEEPING Sitter present: not applicable, Q 15 min safety rounds and observation via security camera. Law enforcement present: Yes ODS 

## 2015-11-28 NOTE — BH Assessment (Signed)
Assessment Completed.  Consulted with Alveda Reasons, Nurse Practitioner Fransisca Kaufmann) who recommends Inpatient Treatment with [facility].  ER MD (Dr. Alphonzo Lemmings) informed of this decision.

## 2015-11-28 NOTE — ED Notes (Signed)
Pt given snack of graham crackers and peanut butter and milk. Will give ativan to help pt rest after snack. Pt agreeable. Remains calm and cooperative at this time.

## 2015-11-28 NOTE — ED Notes (Signed)
Pt IVC pending consult 

## 2015-11-28 NOTE — ED Notes (Signed)
Pt informed he would be re eval on Monday , he does not approve of this plan but he was cooperative.

## 2015-11-28 NOTE — ED Notes (Signed)
Pt is awake and alert this evening, although he is disoriented to time due to OD and waking up unexpectedly in the hospital. Pt blames friends for giving him Fentanyl when he thought he was taking Cocaine. Pt denies SI/HI and AVH as well as withdrawal symptoms. Pt is pleasant and cooperative with staff. 15 minute checks are ongoing for safety.

## 2015-11-28 NOTE — Consult Note (Signed)
Tele-psych Consultation   Reason for Consult: Substance abuse with overdose Referring Physician: [facility] [name]  Edwin [name] is an 30 y.o. male with a history of polysubstance abuse via EMS after being found unresponsive at home by his grandmother. The EMS reported that the patient has been abusing fentanyl. Patient states "I thought it was cocaine but really I was using fentanyl. I did not mean for this to happen. I'm doing good now. I am ready to go home. I would not say my substance abuse is that big of a problem. Nobody can hold me here because I need to go. I want to smoke a cigarette."   Diagnosis: Opiate or related narcotic overdose                   Polysubstance abuse   HPI:   Edwin [name] is a 30 year old male with a history of polysubstance abuse for the last several years. Patient appears to have poor insight into his substance abuse. He is requesting to go home and is not motivated to get help for his substance use. The patient was also brought to the hospital in September of 2016 after a heroin overdose but left AMA instead of receiving treatment. Today patient continues to have poor insight into his need for substance abuse treatment and is again requesting to be discharged. Due to recent overdose the patient appears to be danger to himself. Patient admits to being found unresponsive but minimizes the severity. The nurse present during the assessment reports that the patient required narcan once during the ambulance ride and upon arrival to ED. His insight is lacking and judgment is poor. Patient's urine drug screen is positive for cocaine, marijuana, opiates, and benzos. Edwin [name] reports that he was both snorting and injecting the drugs. His appearance is casual and eye contact is fair. His speech is clear and coherent. The volume of his speech varied as it became louder when requesting to leave the hospital stating "You all can't hold me against my  will." His mood was anxious and his affect was constricted. Patient was noted to be alert and oriented. He denies suicidal or homicidal ideation. His memory is intact. Patient's psychomotor activity was noted to be restless. A referral has been made for the patient to receive substance abuse treatment. This is the patient's second time being brought to the ED for overdose in the last few months. Edwin [name] admits that his drug use is worsening by the day and he does not appear motivated to stop using. Patient appears to be at risk of suffering from another overdose.   History reviewed. No pertinent past medical history.  Past Surgical History  Procedure Laterality Date  . Dental surgery      History reviewed. No pertinent family history.  Social History:  reports that he has been smoking.  He does not have any smokeless tobacco history on file. He reports that he drinks alcohol. He reports that he does not use illicit drugs.  Allergies:  Allergies  Allergen Reactions  . Vicodin [Hydrocodone-Acetaminophen] Other (See Comments)    Reaction:  GI upset     Medications: I have reviewed the patient's current medications.  Results for orders placed or performed during the hospital encounter of 11/27/15 (from the past 48 hour(s))  Comprehensive metabolic panel     Status: Abnormal   Collection Time: 11/27/15  3:52 PM  Result Value Ref Range   Sodium 136 135 - 145 mmol/L   Potassium  4.3 3.5 - 5.1 mmol/L   Chloride 105 101 - 111 mmol/L   CO2 19 (L) 22 - 32 mmol/L   Glucose, Bld 194 (H) 65 - 99 mg/dL   BUN 21 (H) 6 - 20 mg/dL   Creatinine, Ser 1.39 (H) 0.61 - 1.24 mg/dL   Calcium 8.2 (L) 8.9 - 10.3 mg/dL   Total Protein 6.8 6.5 - 8.1 g/dL   Albumin 4.1 3.5 - 5.0 g/dL   AST 26 15 - 41 U/L   ALT 19 17 - 63 U/L   Alkaline Phosphatase 42 38 - 126 U/L   Total Bilirubin 0.5 0.3 - 1.2 mg/dL   GFR calc non Af Amer >60 >60 mL/min   GFR calc Af Amer >60 >60 mL/min    Comment: (NOTE) The eGFR has  been calculated using the CKD EPI equation. This calculation has not been validated in all clinical situations. eGFR's persistently <60 mL/min signify possible Chronic Kidney Disease.    Anion gap 12 5 - 15  Ethanol (ETOH)     Status: None   Collection Time: 11/27/15  3:52 PM  Result Value Ref Range   Alcohol, Ethyl (B) <5 <5 mg/dL    Comment:        LOWEST DETECTABLE LIMIT FOR SERUM ALCOHOL IS 5 mg/dL FOR MEDICAL PURPOSES ONLY   Salicylate level     Status: None   Collection Time: 11/27/15  3:52 PM  Result Value Ref Range   Salicylate Lvl <9.8 2.8 - 30.0 mg/dL  Acetaminophen level     Status: Abnormal   Collection Time: 11/27/15  3:52 PM  Result Value Ref Range   Acetaminophen (Tylenol), Serum <10 (L) 10 - 30 ug/mL    Comment:        THERAPEUTIC CONCENTRATIONS VARY SIGNIFICANTLY. A RANGE OF 10-30 ug/mL MAY BE AN EFFECTIVE CONCENTRATION FOR MANY PATIENTS. HOWEVER, SOME ARE BEST TREATED AT CONCENTRATIONS OUTSIDE THIS RANGE. ACETAMINOPHEN CONCENTRATIONS >150 ug/mL AT 4 HOURS AFTER INGESTION AND >50 ug/mL AT 12 HOURS AFTER INGESTION ARE OFTEN ASSOCIATED WITH TOXIC REACTIONS.   CBC     Status: Abnormal   Collection Time: 11/27/15  3:52 PM  Result Value Ref Range   WBC 20.9 (H) 3.8 - 10.6 K/uL   RBC 4.43 4.40 - 5.90 MIL/uL   Hemoglobin 13.6 13.0 - 18.0 g/dL   HCT 40.6 40.0 - 52.0 %   MCV 91.8 80.0 - 100.0 fL   MCH 30.7 26.0 - 34.0 pg   MCHC 33.5 32.0 - 36.0 g/dL   RDW 13.2 11.5 - 14.5 %   Platelets 246 150 - 440 K/uL  Urine Drug Screen, Qualitative ([facility] only)     Status: Abnormal   Collection Time: 11/27/15  4:49 PM  Result Value Ref Range   Tricyclic, Ur Screen NONE DETECTED NONE DETECTED   Amphetamines, Ur Screen NONE DETECTED NONE DETECTED   MDMA (Ecstasy)Ur Screen NONE DETECTED NONE DETECTED   Cocaine Metabolite,Ur Belle POSITIVE (A) NONE DETECTED   Opiate, Ur Screen POSITIVE (A) NONE DETECTED   Phencyclidine (PCP) Ur S NONE DETECTED NONE DETECTED   Cannabinoid  50 Ng, Ur  POSITIVE (A) NONE DETECTED   Barbiturates, Ur Screen NONE DETECTED NONE DETECTED   Benzodiazepine, Ur Scrn POSITIVE (A) NONE DETECTED   Methadone Scn, Ur NONE DETECTED NONE DETECTED    Comment: (NOTE) 338  Tricyclics, urine               Cutoff 1000 ng/mL 200  Amphetamines, urine  Cutoff 1000 ng/mL 300  MDMA (Ecstasy), urine           Cutoff 500 ng/mL 400  Cocaine Metabolite, urine       Cutoff 300 ng/mL 500  Opiate, urine                   Cutoff 300 ng/mL 600  Phencyclidine (PCP), urine      Cutoff 25 ng/mL 700  Cannabinoid, urine              Cutoff 50 ng/mL 800  Barbiturates, urine             Cutoff 200 ng/mL 900  Benzodiazepine, urine           Cutoff 200 ng/mL 1000 Methadone, urine                Cutoff 300 ng/mL 1100 1200 The urine drug screen provides only a preliminary, unconfirmed 1300 analytical test result and should not be used for non-medical 1400 purposes. Clinical consideration and professional judgment should 1500 be applied to any positive drug screen result due to possible 1600 interfering substances. A more specific alternate chemical method 1700 must be used in order to obtain a confirmed analytical result.  1800 Gas chromato graphy / mass spectrometry (GC/MS) is the preferred 1900 confirmatory method.   Glucose, capillary     Status: None   Collection Time: 11/27/15  9:58 PM  Result Value Ref Range   Glucose-Capillary 98 65 - 99 mg/dL    No results found.  Review of Systems  Constitutional: Negative.   HENT: Negative.   Eyes: Negative.   Respiratory: Negative.   Cardiovascular: Negative.   Gastrointestinal: Negative.   Genitourinary: Negative.   Musculoskeletal: Negative.   Skin: Negative.   Neurological: Negative.   Endo/Heme/Allergies: Negative.   Psychiatric/Behavioral: Positive for substance abuse. Negative for depression, suicidal ideas, hallucinations and memory loss. The patient is nervous/anxious. The patient does  not have insomnia.    Blood pressure 140/70, pulse 84, temperature 98.4 F (36.9 C), temperature source Oral, resp. rate 19, SpO2 99 %. Physical Exam  Assessment/Plan: Patient meets criteria to have his IVC upheld due to being a danger to himself because of his illicit drug use. A referral for [facility] has been made by the counselor to address the patient's worsening substance abuse problem.   Elmarie Shiley, NP-C 11/28/2015, 3:03 PM     I agree with assessment and plan Geralyn Flash A. Sabra Heck, M.D.

## 2015-11-28 NOTE — BH Assessment (Signed)
Writer seen patient to complete re-assessment for an updated mental and emotional status. Patient denies SI/HI and AV/H. He states, on yesterday, he thought he was giving cocaine but it wasn't. He also states, he has ongoing use of cocaine. He states nothing like this has happened in the past.  This past September, he was brought to the ER for an Heroin Overdose. During that time he left AMA.  He is requesting to be sent home with outpatient referral information.  Denies history of treatment for Mental Health or Substance Use.

## 2015-11-28 NOTE — ED Notes (Addendum)
Pt came to nurses station stating he felt fine and could he go home, I explained to him the details of his adm, and he had to wait and see Dr , he was irritated but did return to bed without incident, he adamantly  Denies any suicidal intent he says it was completely accidental,has no c/o stressors or depression

## 2015-11-28 NOTE — ED Notes (Signed)
BEHAVIORAL HEALTH ROUNDING  Patient sleeping: No.  Patient alert and oriented: yes  Behavior appropriate: Yes. ; If no, describe:  Nutrition and fluids offered: Yes  Toileting and hygiene offered: Yes  Sitter present: not applicable, Q 15 min safety rounds and observation via security camera. Law enforcement present: Yes ODS  

## 2015-11-28 NOTE — ED Notes (Signed)
ENVIRONMENTAL ASSESSMENT  Potentially harmful objects out of patient reach: Yes.  Personal belongings secured: Yes.  Patient dressed in hospital provided attire only: Yes.  Plastic bags out of patient reach: Yes.  Patient care equipment (cords, cables, call bells, lines, and drains) shortened, removed, or accounted for: Yes.  Equipment and supplies removed from bottom of stretcher: Yes.  Potentially toxic materials out of patient reach: Yes.  Sharps container removed or out of patient reach: Yes.   BEHAVIORAL HEALTH ROUNDING  Patient sleeping: No.  Patient alert and oriented: yes  Behavior appropriate: Yes. ; If no, describe:  Nutrition and fluids offered: Yes  Toileting and hygiene offered: Yes  Sitter present: not applicable, Q 15 min safety rounds and observation via security camera. Law enforcement present: Yes ODS  

## 2015-11-28 NOTE — ED Notes (Signed)
Pt asleep at this time ,no further c/o

## 2015-11-29 NOTE — ED Notes (Signed)
BEHAVIORAL HEALTH ROUNDING Patient sleeping: Yes.   Patient alert and oriented: not applicable SLEEPING Behavior appropriate: Yes.  ; If no, describe: SLEEPING Nutrition and fluids offered: No SLEEPING Toileting and hygiene offered: NoSLEEPING Sitter present: not applicable, Q 15 min safety rounds and observation via security camera. Law enforcement present: Yes ODS 

## 2015-11-29 NOTE — ED Notes (Signed)
ENVIRONMENTAL ASSESSMENT Potentially harmful objects out of patient reach: Yes Personal belongings secured: Yes Patient dressed in hospital provided attire only: Yes Plastic bags out of patient reach: Yes Patient care equipment (cords, cables, call bells, lines, and drains) shortened, removed, or accounted for: Yes Equipment and supplies removed from bottom of stretcher: Yes Potentially toxic materials out of patient reach: Yes Sharps container removed or out of patient reach: Yes  Patient currently resting. No signs of distress noted. Patient received breakfast tray.  Maintained on 15 minute checks and observation by security camera for safety.

## 2015-11-29 NOTE — ED Notes (Signed)
Patient resting quietly in room. No noted distress or abnormal behaviors noted. Will continue 15 minute checks and observation by security camera for safety. 

## 2015-11-29 NOTE — ED Notes (Signed)
Patient asleep in room. No noted distress or abnormal behavior. Will continue 15 minute checks and observation by security cameras for safety. 

## 2015-11-29 NOTE — ED Notes (Signed)
BEHAVIORAL HEALTH ROUNDING  Patient sleeping: No.  Patient alert and oriented: yes  Behavior appropriate: Yes. ; If no, describe:  Nutrition and fluids offered: Yes  Toileting and hygiene offered: Yes  Sitter present: not applicable, Q 15 min safety rounds and observation via security camera. Law enforcement present: Yes ODS  

## 2015-11-29 NOTE — ED Notes (Signed)
Pt IVC pending consult 

## 2015-11-29 NOTE — ED Provider Notes (Signed)
-----------------------------------------   6:39 AM on 11/29/2015 -----------------------------------------   Blood pressure 105/66, pulse 68, temperature 97.5 F (36.4 C), temperature source Oral, resp. rate 18, SpO2 99 %.  The patient had no acute events since last update.  Calm and cooperative at this time.  Disposition is pending per Psychiatry/Behavioral Medicine team recommendations.     Irean Hong, MD 11/29/15 [phone]

## 2015-11-29 NOTE — ED Notes (Signed)
Patient denies SI/HI/AVH and pain. Patient was not very conversational and did not divulge much information as to how he was feeling. He did say that he felt less lethargic than yesterday. When nurse discussed referral to [facility] patient said that he would go only if he was "forced to go". Maintained on 15 minute checks and observation by security camera for safety.

## 2015-11-29 NOTE — ED Notes (Signed)
Patient currently taking a shower. Patient is calm and shows no signs of distress. Maintained on 15 minute checks and observation by security camera for safety.

## 2015-11-29 NOTE — ED Notes (Signed)
Patient approached nurse and adamantly stated that he did not wish to go to [facility]. He does not wish to be away from his house and at this time does not wish to receive treatment for substance abuse. Patient is cooperative and calm at this time. Will continue to monitor for safety every 15 minutes.

## 2015-11-29 NOTE — ED Notes (Signed)
ENVIRONMENTAL ASSESSMENT  Potentially harmful objects out of patient reach: Yes.  Personal belongings secured: Yes.  Patient dressed in hospital provided attire only: Yes.  Plastic bags out of patient reach: Yes.  Patient care equipment (cords, cables, call bells, lines, and drains) shortened, removed, or accounted for: Yes.  Equipment and supplies removed from bottom of stretcher: Yes.  Potentially toxic materials out of patient reach: Yes.  Sharps container removed or out of patient reach: Yes.   BEHAVIORAL HEALTH ROUNDING  Patient sleeping: No.  Patient alert and oriented: yes  Behavior appropriate: Yes. ; If no, describe:  Nutrition and fluids offered: Yes  Toileting and hygiene offered: Yes  Sitter present: not applicable, Q 15 min safety rounds and observation via security camera. Law enforcement present: Yes ODS   ED BHU PLACEMENT JUSTIFICATION  Is the patient under IVC or is there intent for IVC: Yes.  Is the patient medically cleared: Yes.  Is there vacancy in the ED BHU: Yes.  Is the population mix appropriate for patient: Yes.  Is the patient awaiting placement in inpatient or outpatient setting: Yes.  Has the patient had a psychiatric consult: Yes.  Survey of unit performed for contraband, proper placement and condition of furniture, tampering with fixtures in bathroom, shower, and each patient room: Yes. ; Findings: All clear  APPEARANCE/BEHAVIOR  calm, cooperative and adequate rapport can be established  NEURO ASSESSMENT  Orientation: time, place and person  Hallucinations: No.None noted (Hallucinations)  Speech: Normal  Gait: normal  RESPIRATORY ASSESSMENT  WNL  CARDIOVASCULAR ASSESSMENT  WNL  GASTROINTESTINAL ASSESSMENT  WNL  EXTREMITIES  WNL  PLAN OF CARE  Provide calm/safe environment. Vital signs assessed twice daily. ED BHU Assessment once each 12-hour shift. Collaborate with intake RN daily or as condition indicates. Assure the ED provider has rounded  once each shift. Provide and encourage hygiene. Provide redirection as needed. Assess for escalating behavior; address immediately and inform ED provider.  Assess family dynamic and appropriateness for visitation as needed: Yes. ; If necessary, describe findings:  Educate the patient/family about BHU procedures/visitation: Yes. ; If necessary, describe findings: Pt is calm and cooperative at this time. Pt understanding and accepting of unit procedures/rules. Will continue to monitor with Q 15 min safety rounds and observation via security camera.     

## 2015-11-30 DIAGNOSIS — T40601D Poisoning by unspecified narcotics, accidental (unintentional), subsequent encounter: Secondary | ICD-10-CM

## 2015-11-30 NOTE — ED Notes (Signed)
BEHAVIORAL HEALTH ROUNDING Patient sleeping: Yes.   Patient alert and oriented: not applicable SLEEPING Behavior appropriate: Yes.  ; If no, describe: SLEEPING Nutrition and fluids offered: No SLEEPING Toileting and hygiene offered: NoSLEEPING Sitter present: not applicable, Q 15 min safety rounds and observation via security camera. Law enforcement present: Yes ODS 

## 2015-11-30 NOTE — ED Provider Notes (Signed)
-----------------------------------------   6:27 AM on 11/30/2015 -----------------------------------------   Blood pressure 130/67, pulse 64, temperature 98.3 F (36.8 C), temperature source Oral, resp. rate 14, SpO2 98 %.  The patient had no acute events since last update.  Calm and cooperative at this time.  Disposition is pending per Psychiatry/Behavioral Medicine team recommendations.     Irean Hong, MD 11/30/15 [phone]

## 2015-11-30 NOTE — ED Notes (Signed)
Patient resting quietly in room. No noted distress or abnormal behaviors noted. Will continue 15 minute checks and observation by security camera for safety. 

## 2015-11-30 NOTE — ED Notes (Signed)
Patient asleep in room. No noted distress or abnormal behavior. Will continue 15 minute checks and observation by security cameras for safety. 

## 2015-11-30 NOTE — Discharge Instructions (Signed)
Narcotic Overdose °A narcotic overdose is the misuse or overuse of a narcotic drug. A narcotic overdose can make you pass out and stop breathing. If you are not treated right away, this can cause permanent brain damage or stop your heart. Medicine may be given to reverse the effects of an overdose. If so, this medicine may bring on withdrawal symptoms. The symptoms may be abdominal cramps, throwing up (vomiting), sweating, chills, and nervousness. °Injecting narcotics can cause more problems than just an overdose. AIDS, hepatitis, and other very serious infections are transmitted by sharing needles and syringes. If you decide to quit using, there are medicines which can help you through the withdrawal period. Trying to quit all at once on your own can be uncomfortable, but not life-threatening. Call your caregiver, Narcotics Anonymous, or any drug and alcohol treatment program for further help.  °  °This information is not intended to replace advice given to you by your health care provider. Make sure you discuss any questions you have with your health care provider. °  °Document Released: 11/24/2004 Document Revised: 11/07/2014 Document Reviewed: 04/08/2015 °Elsevier Interactive Patient Education ©2016 [employer] ° °

## 2015-11-30 NOTE — Progress Notes (Signed)
LCSW met with patient and reviewed his discharge follow up plans. He was provided several community resources including SA-IOP . LCSW reviewed safety concerns in the community, he reports he is not suicidal or homicidal and will no longer take risks with drug purchases. LCSW supported patient and reviewed in detail places that work on a person centered plan. He will access resources as he feel the need.  [employer] LCSW [phone]

## 2015-11-30 NOTE — ED Notes (Signed)
Pt denies SI/HI and AVH. Denies pain. Pt stated he was never trying to harm himself and wants to be released home. Pt not interested in any rehab program for substance abuse. Pt cooperative and calm. No complaints, No distress noted. Maintained on 15 minute checks and observation by security camera for safety.

## 2015-11-30 NOTE — ED Notes (Signed)
Pt will be discharged today. Pt calm at this time. Maintained on 15 minute checks and observation by security camera for safety.

## 2015-11-30 NOTE — ED Notes (Signed)
Pt getting dressed for discharge. Pt's grandmother is coming to pick him up. Maintained on 15 minute checks and observation by security camera for safety.

## 2015-11-30 NOTE — ED Notes (Signed)
Psychiatrist at bedside. Maintained on 15 minute checks and observation by security camera for safety. 

## 2015-11-30 NOTE — ED Notes (Addendum)
Pt became loud and cussing on phone with grandmother. Security approached pt and was able to descalte pt verbally.  Pt was offered PO Ativan but refused.  Pt kept stating the doctor needs to "get his a@# in here and see me." Pt is calm at the moment. Maintained on 15 minute checks and observation by security camera for safety.

## 2015-11-30 NOTE — ED Notes (Signed)
Belongings sent with pt. Pt denies SI/HI and AVH.

## 2015-11-30 NOTE — Consult Note (Signed)
[facility] Consult   Reason for Consult:  Follow-up consult for this 30 year old man who came to the emergency room after an apparent overdose on narcotics. Question about proper disposition after emergency treatment. Referring Physician:  Burlene Arnt Patient Identification: Edwin [name] MRN:  [id number] Principal Diagnosis: Opiate or related narcotic overdose Diagnosis:   Patient Active Problem List   Diagnosis Date Noted  . Opiate or related narcotic overdose [T40.601A] 11/28/2015  . Substance abuse [F19.10]     Total Time spent with patient: 30 minutes  Subjective:   Edwin [name] is a 30 y.o. male patient admitted with "I just don't want to be someplace where I don't want to be and I don't want to hurt myself".  HPI:  Patient interviewed. Chart reviewed. Old chart reviewed. Case discussed with emergency room staff. The notes from the telemetry psychiatry evaluation were reviewed as well. This 30 year old man with a history of drug abuse came into the emergency room unconscious after his grandmother called 69. Clinical presentation and his response to treatment strongly suggest opiate overdose. Since regaining consciousness the patient has consistently told the same story. He tells me that the last thing he remembers was buying too little bags of white powder drugs. He says at the time he had assumed they were cocaine and that was what he was wanting to use. He says he doesn't remember it but he assumes that he either snorted it or shot it up in the next thing he knew here he was in the hospital. He denies having any suicidal thought or intent at any point during the whole episode. He says his mood recently has been fine. He sleeps fine. He yells good about his life. Denies any psychotic symptoms. Patient admits that he is a regular drug user although he minimizes it. He tells me that he uses cocaine 2 or 3 times a week and uses opiates or heroin 1 probably an equal amount  of frequency. Patient acknowledges to maintain understanding that his drug abuse is a problem. He acknowledges and understands that the potential consequences of an overdose on narcotics includes death. He knows that this is not the first time this is happening to him. Despite this the patient is strongly requesting not to go to inpatient substance abuse treatment. He says that if he wants to stop, which he really doesn't very much, he will go to outpatient treatment. Patient is not psychotic. Not currently agitated not threatening self or others.  Medical: No significant ongoing medical problems. He was revived from a presumed opiate overdose and now has stabilized.  Substance abuse history: Patient has a history of abuse of multiple drugs. He's presented to emergency rooms with the same situation or similar in the past with emergency overdose on opiates. He has never gone to any kind of inpatient substance abuse treatment. Sounds like he is really never gone to outpatient substance abuse treatment. When I ask him what his longest period he's ever gone without drugs was he told me that it was from when he was a teenager until he was 74 because during that time he only smoked marijuana. It doesn't sound like he's really had any sustained sobriety since then.  Social history: Patient lives with his grandmother. He works doing Microbiologist type jobs.  Past Psychiatric History: Patient has had previous admissions under similar circumstances but has no history of depression or treatment for depression no history of mania and no history of psychosis. Never been in  a psychiatric hospital as far as I can identify. Never been prescribed any psychiatric medicine.  Risk to Self: Suicidal Ideation: No Suicidal Intent: No Is patient at risk for suicide?: No Suicidal Plan?: No Access to Means: No What has been your use of drugs/alcohol within the last 12 months?: Cocaine, Opiates, THC, Benzo How many  times?: 0 Other Self Harm Risks: None identified Triggers for Past Attempts: None known Intentional Self Injurious Behavior: None Risk to Others: Homicidal Ideation: No Thoughts of Harm to Others: No Current Homicidal Intent: No Current Homicidal Plan: No Access to Homicidal Means: No Identified Victim: None identified History of harm to others?: No Assessment of Violence: None Noted Violent Behavior Description: None reported Does patient have access to weapons?: No Criminal Charges Pending?: No Does patient have a court date: No Prior Inpatient Therapy: Prior Inpatient Therapy: No Prior Therapy Dates: N/A Prior Therapy Facilty/Provider(s): N/A Reason for Treatment: N/A Prior Outpatient Therapy: Prior Outpatient Therapy: No Prior Therapy Dates: N/A Prior Therapy Facilty/Provider(s): N/a Reason for Treatment: N/A Does patient have an ACCT team?: No Does patient have Intensive In-House Services?  : No Does patient have [facility] services? : No Does patient have [facility] services?: No  Past Medical History: History reviewed. No pertinent past medical history.  Past Surgical History  Procedure Laterality Date  . Dental surgery     Family History: History reviewed. No pertinent family history. Family Psychiatric  History: Patient denies psychiatric history and his family although he says both his mother and father drink too much. Social History:  History  Alcohol Use  . Yes     History  Drug Use No    Comment: states has stopped using    Social History   Social History  . Marital Status: Single    Spouse Name: N/A  . Number of Children: N/A  . Years of Education: N/A   Social History Main Topics  . Smoking status: Current Every Day Smoker  . Smokeless tobacco: None  . Alcohol Use: Yes  . Drug Use: No     Comment: states has stopped using  . Sexual Activity: Not Asked   Other Topics Concern  . None   Social History Narrative   Additional Social History:     History of alcohol / drug use?: Yes (Pt has a history of SA abuse but was unable to completely answer  questions.)                     Allergies:   Allergies  Allergen Reactions  . Vicodin [Hydrocodone-Acetaminophen] Other (See Comments)    Reaction:  GI upset     Labs: No results found for this or any previous visit (from the past 48 hour(s)).  Current Facility-Administered Medications  Medication Dose Route Frequency Provider Last Rate Last Dose  . LORazepam (ATIVAN) tablet 1 mg  1 mg Oral BID PRN Lisa Roca, MD   1 mg at 11/29/15 2255   No current outpatient prescriptions on file.    Musculoskeletal: Strength & Muscle Tone: within normal limits Gait & Station: normal Patient leans: N/A  Psychiatric Specialty Exam: Review of Systems  Constitutional: Negative.   HENT: Negative.   Eyes: Negative.   Respiratory: Negative.   Cardiovascular: Negative.   Gastrointestinal: Negative.   Musculoskeletal: Negative.   Skin: Negative.   Neurological: Negative.   Psychiatric/Behavioral: Positive for memory loss. Negative for depression, suicidal ideas, hallucinations and substance abuse. The patient is not nervous/anxious and does not  have insomnia.     Blood pressure 123/80, pulse 67, temperature 97.6 F (36.4 C), temperature source Oral, resp. rate 18, SpO2 99 %.There is no weight on file to calculate BMI.  General Appearance: Casual  Eye Contact::  Good  Speech:  Normal Rate  Volume:  Normal  Mood:  Euthymic  Affect:  Congruent  Thought Process:  Linear  Orientation:  Full (Time, Place, and Person)  Thought Content:  Negative  Suicidal Thoughts:  No  Homicidal Thoughts:  No  Memory:  Immediate;   Good Recent;   Fair Remote;   Fair  Judgement:  Fair  Insight:  Fair  Psychomotor Activity:  Normal  Concentration:  Fair  Recall:  [employer] of Knowledge:Fair  Language: Fair  Akathisia:  Negative  Handed:  Right  AIMS (if indicated):     Assets:   Communication Skills Housing Physical Health  ADL's:  Intact  Cognition: WNL  Sleep:      Treatment Plan Summary: Plan Patient interviewed chart reviewed. I am aware that he was brought in under commitment and I am also aware that a tele-psychiatrist had suggested that he still met commitment criteria when they evaluated him and suggested referral to substance abuse treatment. We have made efforts to refer him to the alcohol and drug abuse treatment Center in [place] and have been given the message that it is not likely that they will have any beds available for him in the immediate future. Meanwhile patient is not really getting substance abuse treatment here in the emergency room. Given his lack of motivation he is probably not very likely to benefit from inpatient substance abuse treatment anyway. I think this is an unfortunate situation of a patient who abuses drugs. He is not psychotic he is capable of understanding his own choices and yet he may be at risk for continued reckless behavior. He is well aware of this. There is no treatment that we can provide that is going to make an immediate and predictable change to this. At this point I don't think he really meets commitment criteria. He is sober and saying that he will look into going to outpatient substance abuse treatment area he understands the risks of continued use. Case has been reviewed with the emergency room physician. Case reviewed with psychiatry staff in the emergency room. Agent will be taken off of involuntary commitment and will be discharged to the strong recommendation that he go to [facility] and look into substance abuse treatment. No medication indicated  Disposition: Patient does not meet criteria for psychiatric inpatient admission. Supportive therapy provided about ongoing stressors. Discussed crisis plan, support from social network, calling 911, coming to the Emergency Department, and calling Suicide Hotline.  Sacheen Arrasmith 11/30/2015 4:08 PM

## 2015-11-30 NOTE — ED Provider Notes (Signed)
-----------------------------------------   3:59 PM on 11/30/2015 -----------------------------------------   Blood pressure 123/80, pulse 67, temperature 97.6 F (36.4 C), temperature source Oral, resp. rate 18, SpO2 99 %.  The patient had no acute events since last update.   Seen and evaluated by Dr. Toni Amend and the patient is no longer suicidal or homicidal. He is calm and cooperative at this time. Dr. Toni Amend recommended the patient be discharged home and follow-up with [facility]. He has been taken off commitment at this time.  Myrna Blazer, MD 11/30/15 1600

## 2015-11-30 NOTE — BH Assessment (Signed)
Called and spoke with [facility] [phone]) and they do not have any beds.

## 2015-11-30 NOTE — ED Notes (Signed)
Pt taking a shower 

## 2015-12-24 ENCOUNTER — Emergency Department: Payer: Self-pay

## 2015-12-24 ENCOUNTER — Emergency Department
Admission: EM | Admit: 2015-12-24 | Discharge: 2015-12-24 | Disposition: A | Discharge status: Home / Self Care | Payer: Self-pay | Attending: Emergency Medicine | Admitting: Emergency Medicine

## 2015-12-24 DIAGNOSIS — F1210 Cannabis abuse, uncomplicated: Secondary | ICD-10-CM | POA: Insufficient documentation

## 2015-12-24 DIAGNOSIS — F1410 Cocaine abuse, uncomplicated: Secondary | ICD-10-CM | POA: Insufficient documentation

## 2015-12-24 DIAGNOSIS — T40601A Poisoning by unspecified narcotics, accidental (unintentional), initial encounter: Secondary | ICD-10-CM

## 2015-12-24 DIAGNOSIS — R0902 Hypoxemia: Secondary | ICD-10-CM | POA: Insufficient documentation

## 2015-12-24 DIAGNOSIS — Y9289 Other specified places as the place of occurrence of the external cause: Secondary | ICD-10-CM | POA: Insufficient documentation

## 2015-12-24 DIAGNOSIS — R0689 Other abnormalities of breathing: Secondary | ICD-10-CM | POA: Insufficient documentation

## 2015-12-24 DIAGNOSIS — F17200 Nicotine dependence, unspecified, uncomplicated: Secondary | ICD-10-CM | POA: Insufficient documentation

## 2015-12-24 DIAGNOSIS — Y9389 Activity, other specified: Secondary | ICD-10-CM | POA: Insufficient documentation

## 2015-12-24 DIAGNOSIS — Y998 Other external cause status: Secondary | ICD-10-CM | POA: Insufficient documentation

## 2015-12-24 DIAGNOSIS — R000 Tachycardia, unspecified: Secondary | ICD-10-CM | POA: Insufficient documentation

## 2015-12-24 DIAGNOSIS — T402X1A Poisoning by other opioids, accidental (unintentional), initial encounter: Secondary | ICD-10-CM | POA: Insufficient documentation

## 2015-12-24 LAB — URINE DRUG SCREEN, QUALITATIVE (ARMC ONLY)
AMPHETAMINES, UR SCREEN: NOT DETECTED
BARBITURATES, UR SCREEN: NOT DETECTED
Benzodiazepine, Ur Scrn: NOT DETECTED
Cannabinoid 50 Ng, Ur ~~LOC~~: POSITIVE — AB
Cocaine Metabolite,Ur ~~LOC~~: POSITIVE — AB
MDMA (ECSTASY) UR SCREEN: NOT DETECTED
METHADONE SCREEN, URINE: NOT DETECTED
OPIATE, UR SCREEN: POSITIVE — AB
PHENCYCLIDINE (PCP) UR S: NOT DETECTED
Tricyclic, Ur Screen: NOT DETECTED

## 2015-12-24 LAB — CBC WITH DIFFERENTIAL/PLATELET
BASOS ABS: 0.1 10*3/uL (ref 0–0.1)
BASOS PCT: 0 %
EOS ABS: 0.5 10*3/uL (ref 0–0.7)
EOS PCT: 2 %
HCT: 42.9 % (ref 40.0–52.0)
Hemoglobin: 14.2 g/dL (ref 13.0–18.0)
Lymphocytes Relative: 23 %
Lymphs Abs: 5.5 10*3/uL — ABNORMAL HIGH (ref 1.0–3.6)
MCH: 31.3 pg (ref 26.0–34.0)
MCHC: 33.2 g/dL (ref 32.0–36.0)
MCV: 94.2 fL (ref 80.0–100.0)
MONO ABS: 1.2 10*3/uL — AB (ref 0.2–1.0)
Monocytes Relative: 5 %
NEUTROS ABS: 16.3 10*3/uL — AB (ref 1.4–6.5)
Neutrophils Relative %: 70 %
PLATELETS: 275 10*3/uL (ref 150–440)
RBC: 4.55 MIL/uL (ref 4.40–5.90)
RDW: 13.3 % (ref 11.5–14.5)
WBC: 23.6 10*3/uL — ABNORMAL HIGH (ref 3.8–10.6)

## 2015-12-24 LAB — URINALYSIS COMPLETE WITH MICROSCOPIC (ARMC ONLY)
BILIRUBIN URINE: NEGATIVE
Bacteria, UA: NONE SEEN
Glucose, UA: >500 mg/dL — AB
Hgb urine dipstick: NEGATIVE
KETONES UR: NEGATIVE mg/dL
Leukocytes, UA: NEGATIVE
NITRITE: NEGATIVE
Protein, ur: NEGATIVE mg/dL
Specific Gravity, Urine: 1.015 (ref 1.005–1.030)
pH: 5 (ref 5.0–8.0)

## 2015-12-24 LAB — COMPREHENSIVE METABOLIC PANEL
ALK PHOS: 56 U/L (ref 38–126)
ALT: 13 U/L — ABNORMAL LOW (ref 17–63)
AST: 20 U/L (ref 15–41)
Albumin: 4.3 g/dL (ref 3.5–5.0)
Anion gap: 12 (ref 5–15)
BILIRUBIN TOTAL: 0.4 mg/dL (ref 0.3–1.2)
BUN: 17 mg/dL (ref 6–20)
CALCIUM: 8.8 mg/dL — AB (ref 8.9–10.3)
CO2: 27 mmol/L (ref 22–32)
CREATININE: 1.47 mg/dL — AB (ref 0.61–1.24)
Chloride: 99 mmol/L — ABNORMAL LOW (ref 101–111)
GFR calc Af Amer: >60 mL/min (ref >60)
Glucose, Bld: 297 mg/dL — ABNORMAL HIGH (ref 65–99)
POTASSIUM: 5.2 mmol/L — AB (ref 3.5–5.1)
Sodium: 138 mmol/L (ref 135–145)
TOTAL PROTEIN: 7.5 g/dL (ref 6.5–8.1)

## 2015-12-24 LAB — ETHANOL

## 2015-12-24 MED ORDER — SODIUM CHLORIDE 0.9 % IV BOLUS (SEPSIS)
1000.0000 mL | Freq: Once | INTRAVENOUS | Status: AC
  Administered 2015-12-24: 1000 mL via INTRAVENOUS

## 2015-12-24 MED ORDER — NALOXONE HCL 2 MG/0.4ML IJ SOAJ: 2.0000 mg | INTRAMUSCULAR | Status: DC | PRN

## 2015-12-24 MED ORDER — NALOXONE HCL 2 MG/2ML IJ SOSY
2.0000 mg | PREFILLED_SYRINGE | INTRAMUSCULAR | Status: AC | PRN
  Administered 2015-12-24 (×4): 2 mg via INTRAVENOUS

## 2015-12-24 NOTE — ED Notes (Signed)
Patient discharged to home per MD order. Patient in stable condition, and deemed medically cleared by ED provider for discharge. Discharge instructions reviewed with patient/family using "Teach Back"; verbalized understanding of medication education and administration, and information about follow-up care. Denies further concerns. ° °

## 2015-12-24 NOTE — ED Provider Notes (Signed)
[facility] Emergency Department Provider Note  ____________________________________________  Time seen: 6:50 PM on arrival  I have reviewed the triage vital signs and the nursing notes.   HISTORY  Chief Complaint Drug Overdose    HPI Edwin [name] is a 30 y.o. male initially unresponsive on arrival. After initial treatment with a total of 8 mg of Narcan and the patient regained consciousness. 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 He's had problems with overdose in the past and has been set up with resources at [facility] where he plans to go for rehabilitation program but has not gone yet. Niacin any acute pain or other symptoms. He feels totally fine and back to normal now that he's received adequate reversal of his opiate overdose.     No past medical history on file. Polysubstance abuse  Patient Active Problem List   Diagnosis Date Noted  . Opiate or related narcotic overdose 11/28/2015  . Substance abuse      Past Surgical History  Procedure Laterality Date  . Dental surgery       Current Outpatient Rx  Name  Route  Sig  Dispense  Refill  . Naloxone HCl (EVZIO) 2 MG/0.4ML SOAJ   Injection   Inject 2 mg as directed as needed (for opiate overdose and unconsciousness).   2 Package   0    prescribed by me today   Allergies Vicodin   No family history on file.  Social History Social History  Substance Use Topics  . Smoking status: Current Every Day Smoker  . Smokeless tobacco: Not on file  . Alcohol Use: Yes    Review of Systems  Constitutional:   No fever or chills. No weight changes Eyes:   No blurry vision or double vision.  ENT:   No sore throat.  Cardiovascular:   No chest pain. Respiratory:   No dyspnea or cough. Gastrointestinal:   Negative for abdominal pain, vomiting and diarrhea.  No BRBPR or melena. Genitourinary:   Negative for dysuria or difficulty urinating. Musculoskeletal:   Negative for back pain. No  joint swelling or pain. Skin:   Negative for rash. Neurological:   Negative for headaches, focal weakness or numbness. Psychiatric:  No anxiety or depression.   Endocrine:  No changes in energy or sleep difficulty.  10-point ROS otherwise negative.  ____________________________________________   PHYSICAL EXAM:  VITAL SIGNS: ED Triage Vitals  Enc Vitals Group     BP 12/24/15 1937 149/86 mmHg     Pulse Rate 12/24/15 1937 119     Resp 12/24/15 1937 14     Temp --      Temp src --      SpO2 12/24/15 1910 30 %     Weight --      Height --      Head Cir --      Peak Flow --      Pain Score --      Pain Loc --      Pain Edu? --      Excl. in GC? --      Vital signs reviewed, nursing assessments reviewed.   Constitutional:   Unresponsive.. Eyes:   No scleral icterus. No conjunctival pallor. Pupils pinpoint and nonreactive, fixed gaze bilaterally ENT   Head:   Normocephalic and atraumatic.   Nose:   No congestion/rhinnorhea. No septal hematoma   Mouth/Throat:   MMM, no pharyngeal erythema. No peritonsillar mass.    Neck:   No  stridor. No SubQ emphysema. No meningismus. Hematological/Lymphatic/Immunilogical:   No cervical lymphadenopathy. Cardiovascular:   Tachycardia heart rate 120. Symmetric bilateral radial and DP pulses.  No murmurs.  Respiratory:   Hypopnea with about 3 or 4 shallow breaths per minute. Initial room air oxygen saturation 20% Gastrointestinal:   Soft and nontender. Non distended. There is no CVA tenderness.  No rebound, rigidity, or guarding. Genitourinary:   Normal Musculoskeletal:   Nontender with normal range of motion in all extremities. No joint effusions.  No lower extremity tenderness.  No edema. Neurologic:   Initially unresponsive, GCS of 3.  After milligrams Narcan, awake alert oriented, GCS 15. Normal speech and language.  CN 2-10 normal. Motor grossly intact. No gross focal neurologic deficits are appreciated.  Skin:    Skin  is warm, dry and intact. No rash noted.  No petechiae, purpura, or bullae. Tract marks and right forearm Psychiatric:   Mood and affect are normal. Feels remorseful for drug abuse and expresses desire for rehabilitation ____________________________________________    LABS (pertinent positives/negatives) (all labs ordered are listed, but only abnormal results are displayed) Labs Reviewed  URINALYSIS COMPLETEWITH MICROSCOPIC ([facility] ONLY) - Abnormal; Notable for the following:    Color, Urine YELLOW (*)    APPearance CLEAR (*)    Glucose, UA >500 (*)    Squamous Epithelial / LPF 0-5 (*)    All other components within normal limits  URINE DRUG SCREEN, QUALITATIVE ([facility] ONLY) - Abnormal; Notable for the following:    Cocaine Metabolite,Ur [place] POSITIVE (*)    Opiate, Ur Screen POSITIVE (*)    Cannabinoid 50 Ng, Ur [place] POSITIVE (*)    All other components within normal limits  COMPREHENSIVE METABOLIC PANEL - Abnormal; Notable for the following:    Potassium 5.2 (*)    Chloride 99 (*)    Glucose, Bld 297 (*)    Creatinine, Ser 1.47 (*)    Calcium 8.8 (*)    ALT 13 (*)    All other components within normal limits  CBC WITH DIFFERENTIAL/PLATELET - Abnormal; Notable for the following:    WBC 23.6 (*)    Neutro Abs 16.3 (*)    Lymphs Abs 5.5 (*)    Monocytes Absolute 1.2 (*)    All other components within normal limits  ETHANOL   ____________________________________________   EKG  Interpreted by me Sinus tachycardia rate 103, normal axis intervals QRS ST segments and T waves. No evidence of toxicological conduction delay or other toxic effects  ____________________________________________    RADIOLOGY  Chest x-ray unremarkable  ____________________________________________   PROCEDURES CRITICAL CARE Performed by: Scotty Court, Lessa Huge   Total critical care time: 35 minutes  Critical care time was exclusive of separately billable procedures and treating other  patients.  Critical care was necessary to treat or prevent imminent or life-threatening deterioration.  Critical care was time spent personally by me on the following activities: development of treatment plan with patient and/or surrogate as well as nursing, discussions with consultants, evaluation of patient's response to treatment, examination of patient, obtaining history from patient or surrogate, ordering and performing treatments and interventions, ordering and review of laboratory studies, ordering and review of radiographic studies, pulse oximetry and re-evaluation of patient's condition.   ____________________________________________   INITIAL IMPRESSION / ASSESSMENT AND PLAN / ED COURSE  Pertinent labs & imaging results that were available during my care of the patient were reviewed by me and considered in my medical decision making (see chart for details).  Patient  initially essentially not breathing and severely hypoxic, Periarrest. We administered bag-valve-mask ventilation and then gave Narcan in 2 mg doses every 2-3 minutes until the patient was awake. This required 8 mg within the first 10 or 15 minutes of assessment. After that the patient was back to arrest of 0, responding appropriately and answering questions. That point he denied any symptoms and requested discharge. We encourage him to stay to allow a period of observation to ensure he wouldn't have recurrence of his overdose syndrome once the Narcan wore off to which she was amenable.  ----------------------------------------- 9:41 PM on 12/24/2015 -----------------------------------------  Workup negative. Patient has remained stable awake and alert oriented, comfortable no symptoms. We'll follow up with [facility]. I prescribed him a Narcan autoinjector.     ____________________________________________   FINAL CLINICAL IMPRESSION(S) / ED DIAGNOSES  Final diagnoses:  Opiate overdose, accidental or unintentional,  initial encounter  Hypoxia      Sharman Cheek, MD 12/24/15 2142

## 2015-12-24 NOTE — Discharge Instructions (Signed)
Accidental Overdose °A drug overdose occurs when a chemical substance (drug or medication) is used in amounts large enough to overcome a person. This may result in severe illness or death. This is a type of poisoning. Accidental overdoses of medications or other substances come from a variety of reasons. When this happens accidentally, it is often because the person taking the substance does not know enough about what they have taken. Drugs which commonly cause overdose deaths are alcohol, psychotropic medications (medications which affect the mind), pain medications, illegal drugs (street drugs) such as cocaine and heroin, and multiple drugs taken at the same time. It may result from careless behavior (such as over-indulging at a party). Other causes of overdose may include multiple drug use, a lapse in memory, or drug use after a period of no drug use.  °Sometimes overdosing occurs because a person cannot remember if they have taken their medication.  °A common unintentional overdose in young children involves multi-vitamins containing iron. Iron is a part of the hemoglobin molecule in blood. It is used to transport oxygen to living cells. When taken in small amounts, iron allows the body to restock hemoglobin. In large amounts, it causes problems in the body. If this overdose is not treated, it can lead to death. °Never take medicines that show signs of tampering or do not seem quite right. Never take medicines in the dark or in poor lighting. Read the label and check each dose of medicine before you take it. When adults are poisoned, it happens most often through carelessness or lack of information. Taking medicines in the dark or taking medicine prescribed for someone else to treat the same type of problem is a dangerous practice. °SYMPTOMS  °Symptoms of overdose depend on the medication and amount taken. They can vary from over-activity with stimulant over-dosage, to sleepiness from depressants such as  alcohol, narcotics and tranquilizers. Confusion, dizziness, nausea and vomiting may be present. If problems are severe enough coma and death may result. °DIAGNOSIS  °Diagnosis and management are generally straightforward if the drug is known. Otherwise it is more difficult. At times, certain symptoms and signs exhibited by the patient, or blood tests, can reveal the drug in question.  °TREATMENT  °In an emergency department, most patients can be treated with supportive measures. Antidotes may be available if there has been an overdose of opioids or benzodiazepines. A rapid improvement will often occur if this is the cause of overdose. °At home or away from medical care: °· There may be no immediate problems or warning signs in children. °· Not everything works well in all cases of poisoning. °· Take immediate action. Poisons may act quickly. °· If you think someone has swallowed medicine or a household product, and the person is unconscious, having seizures (convulsions), or is not breathing, immediately call for an ambulance. °IF a person is conscious and appears to be doing OK but has swallowed a poison: °· Do not wait to see what effect the poison will have. Immediately call a poison control center (listed in the white pages of your telephone book under "Poison Control" or inside the front cover with other emergency numbers). Some poison control centers have TTY capability for the deaf. Check with your local center if you or someone in your family requires this service. °· Keep the container so you can read the label on the product for ingredients. °· Describe what, when, and how much was taken and the age and condition of the person poisoned.   Inform them if the person is vomiting, choking, drowsy, shows a change in color or temperature of skin, is conscious or unconscious, or is convulsing. °· Do not cause vomiting unless instructed by medical personnel. Do not induce vomiting or force liquids into a person who  is convulsing, unconscious, or very drowsy. °Stay calm and in control.  °· Activated charcoal also is sometimes used in certain types of poisoning and you may wish to add a supply to your emergency medicines. It is available without a prescription. Call a poison control center before using this medication. °PREVENTION  °Thousands of children die every year from unintentional poisoning. This may be from household chemicals, poisoning from carbon monoxide in a car, taking their parent's medications, or simply taking a few iron pills or vitamins with iron. Poisoning comes from unexpected sources. °· Store medicines out of the sight and reach of children, preferably in a locked cabinet. Do not keep medications in a food cabinet. Always store your medicines in a secure place. Get rid of expired medications. °· If you have children living with you or have them as occasional guests, you should have child-resistant caps on your medicine containers. Keep everything out of reach. Child proof your home. °· If you are called to the telephone or to answer the door while you are taking a medicine, take the container with you or put the medicine out of the reach of small children. °· Do not take your medication in front of children. Do not tell your child how good a medication is and how good it is for them. They may get the idea it is more of a treat. °· If you are an adult and have accidentally taken an overdose, you need to consider how this happened and what can be done to prevent it from happening again. If this was from a street drug or alcohol, determine if there is a problem that needs addressing. If you are not sure a problems exists, it is easy to talk to a professional and ask them if they think you have a problem. It is better to handle this problem in this way before it happens again and has a much worse consequence. °  °This information is not intended to replace advice given to you by your health care provider. Make  sure you discuss any questions you have with your health care provider. °  °Document Released: 12/31/2004 Document Revised: 11/07/2014 Document Reviewed: 04/06/2015 °Elsevier Interactive Patient Education ©2016 [employer] ° °Narcotic Overdose °A narcotic overdose is the misuse or overuse of a narcotic drug. A narcotic overdose can make you pass out and stop breathing. If you are not treated right away, this can cause permanent brain damage or stop your heart. Medicine may be given to reverse the effects of an overdose. If so, this medicine may bring on withdrawal symptoms. The symptoms may be abdominal cramps, throwing up (vomiting), sweating, chills, and nervousness. °Injecting narcotics can cause more problems than just an overdose. AIDS, hepatitis, and other very serious infections are transmitted by sharing needles and syringes. If you decide to quit using, there are medicines which can help you through the withdrawal period. Trying to quit all at once on your own can be uncomfortable, but not life-threatening. Call your caregiver, Narcotics Anonymous, or any drug and alcohol treatment program for further help.  °  °This information is not intended to replace advice given to you by your health care provider. Make sure you discuss any questions   you have with your health care provider. °  °Document Released: 11/24/2004 Document Revised: 11/07/2014 Document Reviewed: 04/08/2015 °Elsevier Interactive Patient Education ©2016 [employer] Inc[employer] ° °

## 2015-12-24 NOTE — ED Notes (Signed)
Pt was dropped off at [facility] by a friend. Pt was unresponsive, pt rr was <4 times a minute. Pt placed directly on cot and taken to room, MD at bedside.

## 2015-12-24 NOTE — ED Notes (Signed)
Pt is now alert and oriented, pt able to speak in complete sentences and maintain airway

## 2016-01-27 ENCOUNTER — Emergency Department
Admission: EM | Admit: 2016-01-27 | Discharge: 2016-01-28 | Disposition: A | Discharge status: Other Acute Inpatient Hospital | Payer: Self-pay | Attending: Emergency Medicine | Admitting: Emergency Medicine

## 2016-01-27 ENCOUNTER — Encounter: Payer: Self-pay | Admitting: *Deleted

## 2016-01-27 DIAGNOSIS — Z885 Allergy status to narcotic agent status: Secondary | ICD-10-CM | POA: Insufficient documentation

## 2016-01-27 DIAGNOSIS — F17200 Nicotine dependence, unspecified, uncomplicated: Secondary | ICD-10-CM | POA: Insufficient documentation

## 2016-01-27 DIAGNOSIS — F32A Depression, unspecified: Secondary | ICD-10-CM

## 2016-01-27 DIAGNOSIS — F1910 Other psychoactive substance abuse, uncomplicated: Secondary | ICD-10-CM | POA: Insufficient documentation

## 2016-01-27 DIAGNOSIS — F329 Major depressive disorder, single episode, unspecified: Secondary | ICD-10-CM | POA: Insufficient documentation

## 2016-01-27 LAB — CBC
HCT: 44.9 % (ref 40.0–52.0)
Hemoglobin: 15.1 g/dL (ref 13.0–18.0)
MCH: 30.7 pg (ref 26.0–34.0)
MCHC: 33.7 g/dL (ref 32.0–36.0)
MCV: 91.1 fL (ref 80.0–100.0)
PLATELETS: 246 10*3/uL (ref 150–440)
RBC: 4.93 MIL/uL (ref 4.40–5.90)
RDW: 13 % (ref 11.5–14.5)
WBC: 19.5 10*3/uL — AB (ref 3.8–10.6)

## 2016-01-27 LAB — COMPREHENSIVE METABOLIC PANEL
ALK PHOS: 50 U/L (ref 38–126)
ALT: 41 U/L (ref 17–63)
AST: 33 U/L (ref 15–41)
Albumin: 4.5 g/dL (ref 3.5–5.0)
Anion gap: 7 (ref 5–15)
BUN: 21 mg/dL — ABNORMAL HIGH (ref 6–20)
CALCIUM: 8.9 mg/dL (ref 8.9–10.3)
CO2: 24 mmol/L (ref 22–32)
CREATININE: 1.19 mg/dL (ref 0.61–1.24)
Chloride: 106 mmol/L (ref 101–111)
Glucose, Bld: 146 mg/dL — ABNORMAL HIGH (ref 65–99)
Potassium: 3.6 mmol/L (ref 3.5–5.1)
Sodium: 137 mmol/L (ref 135–145)
Total Bilirubin: 0.5 mg/dL (ref 0.3–1.2)
Total Protein: 7.6 g/dL (ref 6.5–8.1)

## 2016-01-27 LAB — URINE DRUG SCREEN, QUALITATIVE (ARMC ONLY)
AMPHETAMINES, UR SCREEN: NOT DETECTED
BENZODIAZEPINE, UR SCRN: POSITIVE — AB
Barbiturates, Ur Screen: NOT DETECTED
Cannabinoid 50 Ng, Ur ~~LOC~~: POSITIVE — AB
Cocaine Metabolite,Ur ~~LOC~~: POSITIVE — AB
MDMA (Ecstasy)Ur Screen: NOT DETECTED
METHADONE SCREEN, URINE: NOT DETECTED
Opiate, Ur Screen: POSITIVE — AB
PHENCYCLIDINE (PCP) UR S: NOT DETECTED
Tricyclic, Ur Screen: NOT DETECTED

## 2016-01-27 LAB — SALICYLATE LEVEL

## 2016-01-27 LAB — ACETAMINOPHEN LEVEL: Acetaminophen (Tylenol), Serum: <10 ug/mL — ABNORMAL LOW (ref 10–30)

## 2016-01-27 LAB — ETHANOL

## 2016-01-27 NOTE — ED Notes (Signed)
Pt to ED with SI. Pt states used heroin today around 6-7pm, awoke from it with thoughts of hurting self. Pt states taking 6-7 klonopin approx 45 mins ago. States did not intent to hurt self by taking, rather "just wanted to feel better" Pt with no plan at this time, just states "i just need help" Vitals stable. Pt also states, "I want to be checked out for a blood clot, I've had them before and the back of my left leg is really hurting and it feels like there is a knot there"

## 2016-01-28 ENCOUNTER — Inpatient Hospital Stay
Admission: RE | Admit: 2016-01-28 | Discharge: 2016-02-03 | DRG: 885 | Disposition: A | Discharge status: Home / Self Care | Payer: No Typology Code available for payment source | Source: Intra-hospital | Attending: Psychiatry | Admitting: Psychiatry

## 2016-01-28 ENCOUNTER — Encounter: Payer: Self-pay | Admitting: Psychiatry

## 2016-01-28 DIAGNOSIS — F1210 Cannabis abuse, uncomplicated: Secondary | ICD-10-CM | POA: Diagnosis present

## 2016-01-28 DIAGNOSIS — F1410 Cocaine abuse, uncomplicated: Secondary | ICD-10-CM | POA: Diagnosis present

## 2016-01-28 DIAGNOSIS — R45851 Suicidal ideations: Secondary | ICD-10-CM | POA: Diagnosis present

## 2016-01-28 DIAGNOSIS — R634 Abnormal weight loss: Secondary | ICD-10-CM | POA: Diagnosis present

## 2016-01-28 DIAGNOSIS — Z885 Allergy status to narcotic agent status: Secondary | ICD-10-CM | POA: Diagnosis not present

## 2016-01-28 DIAGNOSIS — F1310 Sedative, hypnotic or anxiolytic abuse, uncomplicated: Secondary | ICD-10-CM | POA: Diagnosis present

## 2016-01-28 DIAGNOSIS — F1010 Alcohol abuse, uncomplicated: Secondary | ICD-10-CM | POA: Diagnosis present

## 2016-01-28 DIAGNOSIS — Z811 Family history of alcohol abuse and dependence: Secondary | ICD-10-CM

## 2016-01-28 DIAGNOSIS — Z818 Family history of other mental and behavioral disorders: Secondary | ICD-10-CM

## 2016-01-28 DIAGNOSIS — F1123 Opioid dependence with withdrawal: Secondary | ICD-10-CM | POA: Diagnosis present

## 2016-01-28 DIAGNOSIS — F17200 Nicotine dependence, unspecified, uncomplicated: Secondary | ICD-10-CM | POA: Diagnosis present

## 2016-01-28 DIAGNOSIS — F332 Major depressive disorder, recurrent severe without psychotic features: Secondary | ICD-10-CM | POA: Diagnosis present

## 2016-01-28 DIAGNOSIS — Z9889 Other specified postprocedural states: Secondary | ICD-10-CM

## 2016-01-28 DIAGNOSIS — F1120 Opioid dependence, uncomplicated: Secondary | ICD-10-CM | POA: Insufficient documentation

## 2016-01-28 DIAGNOSIS — F11221 Opioid dependence with intoxication delirium: Secondary | ICD-10-CM

## 2016-01-28 MED ORDER — NICOTINE 21 MG/24HR TD PT24
21.0000 mg | MEDICATED_PATCH | Freq: Every day | TRANSDERMAL | Status: DC
  Administered 2016-01-28 – 2016-02-03 (×7): 21 mg via TRANSDERMAL
  Filled 2016-01-28 (×6): qty 1

## 2016-01-28 MED ORDER — ALUM & MAG HYDROXIDE-SIMETH 200-200-20 MG/5ML PO SUSP
30.0000 mL | ORAL | Status: DC | PRN
  Administered 2016-01-29 – 2016-02-02 (×5): 30 mL via ORAL
  Filled 2016-01-28 (×7): qty 30

## 2016-01-28 MED ORDER — CLONIDINE HCL 0.1 MG PO TABS: 0.1000 mg | ORAL_TABLET | Freq: Three times a day (TID) | ORAL | Status: DC | PRN

## 2016-01-28 MED ORDER — CHLORDIAZEPOXIDE HCL 25 MG PO CAPS
25.0000 mg | ORAL_CAPSULE | Freq: Three times a day (TID) | ORAL | Status: DC
  Administered 2016-01-28 – 2016-02-02 (×15): 25 mg via ORAL
  Filled 2016-01-28 (×15): qty 1

## 2016-01-28 MED ORDER — ACETAMINOPHEN 325 MG PO TABS
650.0000 mg | ORAL_TABLET | Freq: Four times a day (QID) | ORAL | Status: DC | PRN
  Administered 2016-01-31 – 2016-02-03 (×5): 650 mg via ORAL
  Filled 2016-01-28 (×5): qty 2

## 2016-01-28 MED ORDER — TRAZODONE HCL 100 MG PO TABS
100.0000 mg | ORAL_TABLET | Freq: Every evening | ORAL | Status: DC | PRN
  Administered 2016-01-28 – 2016-01-29 (×2): 100 mg via ORAL
  Filled 2016-01-28 (×2): qty 1

## 2016-01-28 MED ORDER — BUPRENORPHINE HCL 2 MG SL SUBL
8.0000 mg | SUBLINGUAL_TABLET | Freq: Every day | SUBLINGUAL | Status: AC
  Administered 2016-01-28 – 2016-01-30 (×3): 8 mg via SUBLINGUAL
  Filled 2016-01-28 (×3): qty 4

## 2016-01-28 MED ORDER — MAGNESIUM HYDROXIDE 400 MG/5ML PO SUSP
30.0000 mL | Freq: Every day | ORAL | Status: DC | PRN
  Administered 2016-02-02 – 2016-02-03 (×2): 30 mL via ORAL
  Filled 2016-01-28 (×2): qty 30

## 2016-01-28 MED ORDER — CYCLOBENZAPRINE HCL 10 MG PO TABS: 10.0000 mg | ORAL_TABLET | Freq: Three times a day (TID) | ORAL | Status: DC | PRN

## 2016-01-28 MED ORDER — LOPERAMIDE HCL 2 MG PO CAPS: 2.0000 mg | ORAL_CAPSULE | ORAL | Status: DC | PRN

## 2016-01-28 NOTE — ED Notes (Signed)
Patient noted in room. No complaints, stable, in no acute distress. Q15 minute rounds and monitoring via [employer] to continue.  

## 2016-01-28 NOTE — [facility] Group Notes (Signed)
[facility] LCSW Group Therapy   01/28/2016 11am  Type of Therapy: Group Therapy   Participation Level: Did Not Attend. Patient invited to participate but declined.    Nathan Stallworth F. Tawn Fitzner, MSW, LCSWA, LCAS   

## 2016-01-28 NOTE — ED Notes (Signed)
Patient denies SI/HI/AVH.  Patient affect is appropriate and his mood is anxious.   No distress noted.

## 2016-01-28 NOTE — ED Notes (Signed)
Dr. Clearnce SorrelFeheem in to see Patient. Patient remains calm and cooperative.

## 2016-01-28 NOTE — Consult Note (Signed)
[name] Consult   Reason for Consult:  Depression and overdose Referring Physician:  [name] Patient Identification: Edwin [name] MRN:  [id number] Principal Diagnosis: Major Depressive disorder recurrent severe without psychotic features                                      Opioid use disorder severe Diagnosis:   Patient Active Problem List   Diagnosis Date Noted  . Opiate or related narcotic overdose [T40.601A] 11/28/2015  . Substance abuse [F19.10]     Total Time spent with patient: 1 hour  Subjective:   Edwin [name] is a 30 y.o. male patient who presented to the emergency room after he overdosed on heroin.  HPI:   Most of the history was obtained from the patient as well as review of his chart. During my interview patient reported that he got help. He reported that he has mental and substance abuse problems. He stated that " my brain is sick, I overdose yesterday " Patient reported that he has been trying to kill himself and did his attempt 3 weeks ago as well. He has been consuming heroine and shot up and went out yesterday. He reported that he also used cocaine IV 2 days ago. He has been using a gram to 8 balls on a daily basis. Patient also uses pain medications including Percocet as well as 6 Klonopin pills. Patient reported that he is unable to contract for safety and wants this misery to end. He feels depressed hopeless and helpless. Patient is agreeable to admission to the inpatient psychiatric unit for the treatment of his depression and anxiety as well as withdrawal from his drugs. He reported that he is currently experiencing withdrawal symptoms including abdominal pain and diarrhea  and muscle aches.   Medical: No significant medical history noted  Substance abuse history: Patient has a history of abuse of multiple drugs. History of multiple overdoses with presentations to the emergency room in the past. He reported that he has not been to the   substance abuse rehabilitation program.   Social history: Patient lives with his grandmother. He works doing Microbiologist type jobs.  Past Psychiatric History: Patient has had previous admissions under similar circumstances but has no history of depression or treatment for depression no history of mania and no history of psychosis.     Risk to Self: Is patient at risk for suicide?: Yes Risk to Others:   Prior Inpatient Therapy:   Prior Outpatient Therapy:    Past Medical History: History reviewed. No pertinent past medical history.  Past Surgical History  Procedure Laterality Date  . Dental surgery     Family History: History reviewed. No pertinent family history. Family Psychiatric  History: history of alcoholism in his father and depression in his mother Social History:  History  Alcohol Use  . Yes     History  Drug Use No    Comment: states has stopped using    Social History   Social History  . Marital Status: Single    Spouse Name: N/A  . Number of Children: N/A  . Years of Education: N/A   Social History Main Topics  . Smoking status: Current Every Day Smoker  . Smokeless tobacco: None  . Alcohol Use: Yes  . Drug Use: No     Comment: states has stopped using  . Sexual Activity: Not Asked  Other Topics Concern  . None   Social History Narrative   Additional Social History:    Allergies:   Allergies  Allergen Reactions  . Vicodin [Hydrocodone-Acetaminophen] Other (See Comments)    Reaction:  GI upset     Labs:  Results for orders placed or performed during the hospital encounter of 01/27/16 (from the past 48 hour(s))  Comprehensive metabolic panel     Status: Abnormal   Collection Time: 01/27/16 10:32 PM  Result Value Ref Range   Sodium 137 135 - 145 mmol/L   Potassium 3.6 3.5 - 5.1 mmol/L   Chloride 106 101 - 111 mmol/L   CO2 24 22 - 32 mmol/L   Glucose, Bld 146 (H) 65 - 99 mg/dL   BUN 21 (H) 6 - 20 mg/dL   Creatinine, Ser 1.19  0.61 - 1.24 mg/dL   Calcium 8.9 8.9 - 10.3 mg/dL   Total Protein 7.6 6.5 - 8.1 g/dL   Albumin 4.5 3.5 - 5.0 g/dL   AST 33 15 - 41 U/L   ALT 41 17 - 63 U/L   Alkaline Phosphatase 50 38 - 126 U/L   Total Bilirubin 0.5 0.3 - 1.2 mg/dL   GFR calc non Af Amer >60 >60 mL/min   GFR calc Af Amer >60 >60 mL/min    Comment: (NOTE) The eGFR has been calculated using the CKD EPI equation. This calculation has not been validated in all clinical situations. eGFR's persistently <60 mL/min signify possible Chronic Kidney Disease.    Anion gap 7 5 - 15  Ethanol (ETOH)     Status: None   Collection Time: 01/27/16 10:32 PM  Result Value Ref Range   Alcohol, Ethyl (B) <5 <5 mg/dL    Comment:        LOWEST DETECTABLE LIMIT FOR SERUM ALCOHOL IS 5 mg/dL FOR MEDICAL PURPOSES ONLY   CBC     Status: Abnormal   Collection Time: 01/27/16 10:32 PM  Result Value Ref Range   WBC 19.5 (H) 3.8 - 10.6 K/uL   RBC 4.93 4.40 - 5.90 MIL/uL   Hemoglobin 15.1 13.0 - 18.0 g/dL   HCT 44.9 40.0 - 52.0 %   MCV 91.1 80.0 - 100.0 fL   MCH 30.7 26.0 - 34.0 pg   MCHC 33.7 32.0 - 36.0 g/dL   RDW 13.0 11.5 - 14.5 %   Platelets 246 938 - 182 K/uL  Salicylate level     Status: None   Collection Time: 01/27/16 10:32 PM  Result Value Ref Range   Salicylate Lvl <9.9 2.8 - 30.0 mg/dL  Acetaminophen level     Status: Abnormal   Collection Time: 01/27/16 10:32 PM  Result Value Ref Range   Acetaminophen (Tylenol), Serum <10 (L) 10 - 30 ug/mL    Comment:        THERAPEUTIC CONCENTRATIONS VARY SIGNIFICANTLY. A RANGE OF 10-30 ug/mL MAY BE AN EFFECTIVE CONCENTRATION FOR MANY PATIENTS. HOWEVER, SOME ARE BEST TREATED AT CONCENTRATIONS OUTSIDE THIS RANGE. ACETAMINOPHEN CONCENTRATIONS >150 ug/mL AT 4 HOURS AFTER INGESTION AND >50 ug/mL AT 12 HOURS AFTER INGESTION ARE OFTEN ASSOCIATED WITH TOXIC REACTIONS.   Urine Drug Screen, Qualitative ([place] only)     Status: Abnormal   Collection Time: 01/27/16 10:33 PM  Result Value  Ref Range   Tricyclic, Ur Screen NONE DETECTED NONE DETECTED   Amphetamines, Ur Screen NONE DETECTED NONE DETECTED   MDMA (Ecstasy)Ur Screen NONE DETECTED NONE DETECTED   Cocaine Metabolite,Ur [place] POSITIVE (A) NONE DETECTED  Opiate, Ur Screen POSITIVE (A) NONE DETECTED   Phencyclidine (PCP) Ur S NONE DETECTED NONE DETECTED   Cannabinoid 50 Ng, Ur [place] POSITIVE (A) NONE DETECTED   Barbiturates, Ur Screen NONE DETECTED NONE DETECTED   Benzodiazepine, Ur Scrn POSITIVE (A) NONE DETECTED   Methadone Scn, Ur NONE DETECTED NONE DETECTED    Comment: (NOTE) 481  Tricyclics, urine               Cutoff 1000 ng/mL 200  Amphetamines, urine             Cutoff 1000 ng/mL 300  MDMA (Ecstasy), urine           Cutoff 500 ng/mL 400  Cocaine Metabolite, urine       Cutoff 300 ng/mL 500  Opiate, urine                   Cutoff 300 ng/mL 600  Phencyclidine (PCP), urine      Cutoff 25 ng/mL 700  Cannabinoid, urine              Cutoff 50 ng/mL 800  Barbiturates, urine             Cutoff 200 ng/mL 900  Benzodiazepine, urine           Cutoff 200 ng/mL 1000 Methadone, urine                Cutoff 300 ng/mL 1100 1200 The urine drug screen provides only a preliminary, unconfirmed 1300 analytical test result and should not be used for non-medical 1400 purposes. Clinical consideration and professional judgment should 1500 be applied to any positive drug screen result due to possible 1600 interfering substances. A more specific alternate chemical method 1700 must be used in order to obtain a confirmed analytical result.  1800 Gas chromato graphy / mass spectrometry (GC/MS) is the preferred 1900 confirmatory method.     No current facility-administered medications for this encounter.   Current Outpatient Prescriptions  Medication Sig Dispense Refill  . Naloxone HCl (EVZIO) 2 MG/0.4ML SOAJ Inject 2 mg as directed as needed (for opiate overdose and unconsciousness). 2 Package 0    Musculoskeletal: Strength &  Muscle Tone: within normal limits Gait & Station: normal Patient leans: N/A  Psychiatric Specialty Exam: Review of Systems  Constitutional: Positive for malaise/fatigue.  Gastrointestinal: Positive for nausea, abdominal pain and diarrhea.  Musculoskeletal: Positive for myalgias, back pain and joint pain.  Psychiatric/Behavioral: Positive for depression and substance abuse. The patient is nervous/anxious.     Blood pressure 127/73, pulse 75, temperature 97.6 F (36.4 C), temperature source Oral, resp. rate 20, height '5\' 9"'  (1.753 m), weight 200 lb (90.719 kg), SpO2 100 %.Body mass index is 29.52 kg/(m^2).  General Appearance: Casual  Eye Contact::  Fair  Speech:  Slow  Volume:  Decreased  Mood:  Anxious and Depressed  Affect:  Congruent  Thought Process:  Goal Directed  Orientation:  Full (Time, Place, and Person)  Thought Content:  WDL  Suicidal Thoughts:  Yes.  with intent/plan  Homicidal Thoughts:  No  Memory:  Immediate;   Fair  Judgement:  Impaired  Insight:  Shallow  Psychomotor Activity:  Decreased  Concentration:  Poor  Recall:  [employer] of Knowledge:Fair  Language: Fair  Akathisia:  No  Handed:  Right  AIMS (if indicated):     Assets:  Communication Skills Desire for Improvement Physical Health Social Support  ADL's:  Intact  Cognition: WNL  Sleep:  Treatment Plan Summary: Medication management  Disposition: Recommend psychiatric Inpatient admission when medically cleared.   Patient will be admitted to the inpatient behavioral health unit for stabilization and safety. I will start him on Librium 25 mg by mouth 3 times a day to prevent withdrawals from benzodiazepines. I will also start him on clonidine, Flexeril Motrin and trazodone to help with the withdrawal from opioids The treatment team to monitor Case discussed with the ED physician and the behavioral health staff and they agreed with the plan  Thank you for allowing me to participate in the  care of this patient      This note was generated in part or whole with voice recognition software. Voice regonition is usually quite accurate but there are transcription errors that can and very often do occur. I apologize for any typographical errors that were not detected and corrected.   Rainey Pines, MD 01/28/2016 2:27 PM

## 2016-01-28 NOTE — ED Notes (Signed)
Report called to Kenisha, RN in ED BHU.  

## 2016-01-28 NOTE — Tx Team (Signed)
Initial Interdisciplinary Treatment Plan   PATIENT STRESSORS: Marital or family conflict Substance abuse   PATIENT STRENGTHS: Capable of independent living Communication skills   PROBLEM LIST: Problem List/Patient Goals Date to be addressed Date deferred Reason deferred Estimated date of resolution  Substance Abuse  3/30           Depression  3/30                                          DISCHARGE CRITERIA:  Improved stabilization in mood, thinking, and/or behavior Need for constant or close observation no longer present  PRELIMINARY DISCHARGE PLAN: Attend aftercare/continuing care group Return to previous living arrangement  PATIENT/FAMIILY INVOLVEMENT: This treatment plan has been presented to and reviewed with the patient, Edwin [name], and/or family member, .  The patient and family have been given the opportunity to ask questions and make suggestions.  Ignacia FellingJennifer A Saree Krogh 01/28/2016, 2:51 PM

## 2016-01-28 NOTE — ED Notes (Signed)
Patient with discharge orders to be admitted to downstairs behavior medicine, nurse called charge nurse and Patient will be going to room 310. Patient is calm, cooperative, q 15 min, camera monitoring in progress.

## 2016-01-28 NOTE — ED Provider Notes (Signed)
[facility] Emergency Department Provider Note  ____________________________________________  Time seen: 11:30 PM  I have reviewed the triage vital signs and the nursing notes.   HISTORY  Chief Complaint Suicidal     HPI Edwin [name] is a 30 y.o. male patient states that he used heroin approximately 6 or 7:00 as well as Klonopin to "get high. Patient said he had no intention of hurting himself. However patient does admit to feeling very depressed and is requesting detox at this time patient admits to drug overdoses in the past     History reviewed. No pertinent past medical history.  Patient Active Problem List   Diagnosis Date Noted  . Opiate or related narcotic overdose 11/28/2015  . Substance abuse     Past Surgical History  Procedure Laterality Date  . Dental surgery      Current Outpatient Rx  Name  Route  Sig  Dispense  Refill  . Naloxone HCl (EVZIO) 2 MG/0.4ML SOAJ   Injection   Inject 2 mg as directed as needed (for opiate overdose and unconsciousness).   2 Package   0     Allergies Vicodin  History reviewed. No pertinent family history.  Social History Social History  Substance Use Topics  . Smoking status: Current Every Day Smoker  . Smokeless tobacco: None  . Alcohol Use: Yes    Review of Systems  Constitutional: Negative for fever. Eyes: Negative for visual changes. ENT: Negative for sore throat. Cardiovascular: Negative for chest pain. Respiratory: Negative for shortness of breath. Gastrointestinal: Negative for abdominal pain, vomiting and diarrhea. Genitourinary: Negative for dysuria. Musculoskeletal: Negative for back pain. Skin: Negative for rash. Neurological: Negative for headaches, focal weakness or numbness.   10-point ROS otherwise negative.  ____________________________________________   PHYSICAL EXAM:  VITAL SIGNS: ED Triage Vitals  Enc Vitals Group     BP 01/27/16 2229 134/89 mmHg    Pulse Rate 01/27/16 2229 96     Resp 01/27/16 2229 16     Temp 01/27/16 2229 98.4 F (36.9 C)     Temp Source 01/27/16 2229 Oral     SpO2 --      Weight 01/27/16 2229 200 lb (90.719 kg)     Height 01/27/16 2229 5\' 9"  (1.753 m)     Head Cir --      Peak Flow --      Pain Score 01/27/16 2229 8     Pain Loc --      Pain Edu? --      Excl. in GC? --      Constitutional: Alert and oriented. Well appearing and in no distress. Eyes: Conjunctivae are normal. PERRL. Normal extraocular movements. ENT   Head: Normocephalic and atraumatic.   Nose: No congestion/rhinnorhea.   Mouth/Throat: Mucous membranes are moist.   Neck: No stridor. Hematological/Lymphatic/Immunilogical: No cervical lymphadenopathy. Cardiovascular: Normal rate, regular rhythm. Normal and symmetric distal pulses are present in all extremities. No murmurs, rubs, or gallops. Respiratory: Normal respiratory effort without tachypnea nor retractions. Breath sounds are clear and equal bilaterally. No wheezes/rales/rhonchi. Gastrointestinal: Soft and nontender. No distention. There is no CVA tenderness. Genitourinary: deferred Musculoskeletal: Nontender with normal range of motion in all extremities. No joint effusions.  No lower extremity tenderness nor edema. Neurologic:  Normal speech and language. No gross focal neurologic deficits are appreciated. Speech is normal.  Skin:  Skin is warm, dry and intact. No rash noted. Psychiatric: Mood and affect are normal. Speech and behavior are normal.  Patient exhibits appropriate insight and judgment.  ____________________________________________    LABS (pertinent positives/negatives)  Labs Reviewed  COMPREHENSIVE METABOLIC PANEL - Abnormal; Notable for the following:    Glucose, Bld 146 (*)    BUN 21 (*)    All other components within normal limits  CBC - Abnormal; Notable for the following:    WBC 19.5 (*)    All other components within normal limits  URINE DRUG  SCREEN, QUALITATIVE ([facility] ONLY) - Abnormal; Notable for the following:    Cocaine Metabolite,Ur [place] POSITIVE (*)    Opiate, Ur Screen POSITIVE (*)    Cannabinoid 50 Ng, Ur [place] POSITIVE (*)    Benzodiazepine, Ur Scrn POSITIVE (*)    All other components within normal limits  ACETAMINOPHEN LEVEL - Abnormal; Notable for the following:    Acetaminophen (Tylenol), Serum <10 (*)    All other components within normal limits  ETHANOL  SALICYLATE LEVEL    _   INITIAL IMPRESSION / ASSESSMENT AND PLAN / ED COURSE  Pertinent labs & imaging results that were available during my care of the patient were reviewed by me and considered in my medical decision making (see chart for details).   ____________________________________________   FINAL CLINICAL IMPRESSION(S) / ED DIAGNOSES  Final diagnoses:  Polysubstance abuse  Depression      Darci Current, MD 01/29/16 [phone]

## 2016-01-28 NOTE — ED Notes (Signed)
Patient [facility] aware [facility] Patient signs voluntary form. Patient's belongings taken with him, no s/s of distress.

## 2016-01-28 NOTE — ED Notes (Signed)
Patient is watching tv, calm and cooperative, no plan to harm self, q15 min. Checks, and camera monitoring.

## 2016-01-28 NOTE — ED Notes (Signed)
Patient ate breakfast, patient did ask for Medication for detox, but denies symptoms, patient states He just wants to go back to sleep. q 15 min. Checks and camera monitoring in progress.

## 2016-01-28 NOTE — ED Provider Notes (Signed)
-----------------------------------------   8:17 AM on 01/28/2016 -----------------------------------------   Blood pressure 127/73, pulse 75, temperature 97.6 F (36.4 C), temperature source Oral, resp. rate 20, height 5\' 9"  (1.753 m), weight 200 lb (90.719 kg), SpO2 100 %.  The patient had no acute events since last update.  Calm and cooperative at this time.  Disposition is pending per Psychiatry/Behavioral Medicine team recommendations.     Jeanmarie PlantJames A Zacchaeus Halm, MD 01/28/16 [phone]

## 2016-01-28 NOTE — Progress Notes (Addendum)
Patient with sad affect, cooperative behavior with admission interview and admission assessment. Denies SI/SH/HI at this time. Refuses to sign admission paperwork for unit rules and next of kin notification. Grandmother's phone number listed as next of kin in case of emergency. Patient skin check performed with no wounds or bruises. Skin check performed with no contraband found. Dinner tray ordered. Towels and toiletries provided. Safety maintained at this time. Patient questions how long his stay may last. Patient meds administered as ordered. Patient uses phone to call family/friends. Teary eyed and does well with support and encouragement. Discussed possibility of living clean and sober. Patient mood improves and playing cards with peers. States he likes to read and play chess. Safety maintained.

## 2016-01-28 NOTE — ED Notes (Signed)
Nurse talked with Patient, Patient has flat affect, states that He has been depressed for the last 5 years, He states He has 2 sons 9 and 7 that His mom has custody of because He lost them over having pot at His house, states the mom is not involved, He says He lost His house and kids, He can go visit His kids at anytime,but states it is not the same, now He has been on other drugs including heroin and cocaine, He states His family can be supportive, but want to avoid him also, because they think He will eventually take overdose and die and they are trying to prevent from being so hurt. Patient states " I in vision me dying from OD also. Nurse talked to him about choices, and recovery, NA meetings and the need for him to go any length to stay clean. Patient states " i hope to stay here" I know I need help. Patient does not have a plan to harm himself, but is depressed and states " I feel like giving up" q 15 min. Checks and camera monitoring in progress.

## 2016-01-29 NOTE — [facility] Group Notes (Signed)
[facility] LCSW Group Therapy   01/29/2016 1:15 PM   Type of Therapy: Group Therapy   Participation Level: Active   Participation Quality: Attentive, Sharing and Supportive   Affect: Depressed and Flat   Cognitive: Alert and Oriented   Insight: Developing/Improving and Engaged   Engagement in Therapy: Developing/Improving and Engaged   Modes of Intervention: Clarification, Confrontation, Discussion, Education, Exploration, Limit-setting, Orientation, Problem-solving, Rapport Building, Dance movement psychotherapisteality Testing, Socialization and Support   Summary of Progress/Problems: The topic for today was feelings about relapse. Pt discussed what relapse prevention is to them and identified triggers that they are on the path to relapse. Pt processed their feeling towards relapse and was able to relate to peers. Pt discussed coping skills that can be used for relapse prevention. Pt discussed coping skills that can be used for relapse prevention. Pt shared he was  ot depressed and not lonely and that he didn't understand why others were.  Pt shared he was using too may drugs at times at times but that he feels better now sober and that he enjoyed groups and the dayroom with other pt.'s.  Pt shared he believes things will turn out good for him and he will not relapse with alcohol and drugs as long as he does not isolate  Pt reported that for the pt drugs is a trigger.   CSW actively validated the pt's opinion and provided feedback.  Pt was polite and cooperative with the CSW and other group members and focused and attentive to the topics discussed and the sharing of others.    Dorothe PeaJonathan F. Yareliz Thorstenson, MSW, LCSWA, LCAS

## 2016-01-29 NOTE — Plan of Care (Signed)
Problem: Alteration in mood & ability to function due to Goal: LTG-Patient demonstrates decreased signs of withdrawal (Patient demonstrates decreased signs of withdrawal to the point the patient is safe to return home and continue treatment in an outpatient setting) Outcome: Progressing No symptoms of withdrawal are seen this shift

## 2016-01-29 NOTE — Progress Notes (Signed)
D: Pt denies SI/HI/AVH. Pt is pleasant and cooperative., affect is bright, thoughts are organized, no bizarre behavior noted. Pt appears less anxious and he is interacting with peers and staff appropriately.  A: Pt was offered support and encouragement. Pt was given scheduled medications. Pt was encouraged to attend groups. Q 15 minute checks were done for safety.  R:Pt attends groups and interacts well with peers and staff. Pt is taking medication. Pt has no complaints.Pt receptive to treatment and safety maintained on unit.

## 2016-01-29 NOTE — Progress Notes (Signed)
Recreation Therapy Notes  Date: 03.31.17 Time: 3:00 pm Location: Craft Room  Group Topic: Communication, Problem Solving, Teamwork  Goal Area(s) Addresses:  Patient will effectively work with peer towards shared goal. Patient will identify skills used to make activity successful. Patient will identify benefit of using group skills effectively post d/c.  Behavioral Response: Attentive, Interactive  Intervention: [employer] Tower  Activity: Patients were given 15 pipe cleaners and were instructed to build a free standing tower out of all 15 pipe cleaners. After approximately 3-5 minutes, patients were instructed to put their dominant hand behind their back. After another 3-5 minutes, patients were instructed to stop talking to their group.  Education: LRT educated patients on healthy support systems.  Education Outcome: Acknowledges education/In group clarification offered  Clinical Observations/Feedback: Patient worked with peers to build tower. Patient used effective communication, problem solving, and teamwork skills. Patient did not contribute to group discussion.  Jacquelynn CreeGreene,Tregan Read M, LRT/CTRS 01/29/2016 4:35 PM

## 2016-01-29 NOTE — Plan of Care (Signed)
Problem: Alteration in mood Goal: LTG-Pt's behavior demonstrates decreased signs of depression (Patient's behavior demonstrates decreased signs of depression to the point the patient is safe to return home and continue treatment in an outpatient setting)  Outcome: Progressing Patient reports feeling better today than he did yesterday

## 2016-01-29 NOTE — Plan of Care (Signed)
Problem: Ineffective individual coping Goal: LTG: Patient will report a decrease in negative feelings Outcome: Not Progressing Patient denies SI/HI.

## 2016-01-29 NOTE — Progress Notes (Signed)
D: Patient is alert and oriented on the unit this shift. Patient attended and actively participated in groups today. Patient denies suicidal ideation, homicidal ideation, auditory or visual hallucinations at the present time.  A: Scheduled medications are administered to patient as per MD orders. Emotional support and encouragement are provided. Patient is maintained on q.15 minute safety checks. Patient is informed to notify staff with questions or concerns. R: No adverse medication reactions are noted. Patient is cooperative with medication administration and treatment plan today. Patient is receptive, calm and cooperative on the unit at this time. Patient keeps to self on the unit this shift. Patient contracts for safety at this time. Patient remains safe at this time.

## 2016-01-29 NOTE — [facility] Group Notes (Signed)
[facility]   01/29/2016 9:15 AM  ?  Participation Quality: Alert, Appropriate and Oriented   Mood/Affect: Depressed and Flat   Depression Rating: 0  Anxiety Rating: 0  Thoughts of Suicide: Pt denies SI/HI   Will you contract for safety? Yes   Current AVH: Pt denies   Plan for Discharge/Comments: Pt attended discharge planning group and actively participated in group. CSW provided pt with today's workbook. Pt reports he looks forward to making people laugh and try to keep moving forward upon discharge.  Pt shared he hopes to go to [facility] for substance abuse treatment.  CSW actively validated the pt's opinion and provided feedback.  Pt was polite and cooperative with the CSW and other group members and focused and attentive to the topics discussed and the sharing of others.  Transportation Means: Pt reports access to transportation   Supports: No supports mentioned at this time     Dorothe PeaJonathan F. Earle Burson, MSW, LCSWA, LCAS

## 2016-01-29 NOTE — BHH Suicide Risk Assessment (Signed)
[facility] Admission Suicide Risk Assessment   Nursing information obtained from:    Demographic factors:    Current Mental Status:    Loss Factors:    Historical Factors:    Risk Reduction Factors:     Total Time spent with patient: 1 hour Principal Problem: Major depressive disorder, recurrent severe without psychotic features (HCC) Diagnosis:   Patient Active Problem List   Diagnosis Date Noted  . Tobacco use disorder [F17.200] 01/28/2016  . 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 Opioid dependence with intoxication delirium (HCC) [F11.221]   . Opiate or related narcotic overdose [T40.601A] 11/28/2015  . Substance abuse [F19.10]    Subjective Data: Depression, suicidal ideation, substance abuse.  Continued Clinical Symptoms:  Alcohol Use Disorder Identification Test Final Score (AUDIT): 0 The "Alcohol Use Disorders Identification Test", Guidelines for Use in Primary Care, Second Edition.  World Science writer [facility]). Score between 0-7:  no or low risk or alcohol related problems. Score between 8-15:  moderate risk of alcohol related problems. Score between 16-19:  high risk of alcohol related problems. Score 20 or above:  warrants further diagnostic evaluation for alcohol dependence and treatment.   CLINICAL FACTORS:   Depression:   Comorbid alcohol abuse/dependence Impulsivity Alcohol/Substance Abuse/Dependencies   Musculoskeletal: Strength & Muscle Tone: within normal limits Gait & Station: normal Patient leans: N/A  Psychiatric Specialty Exam: Review of Systems  Gastrointestinal: Positive for nausea.  Psychiatric/Behavioral: Positive for depression, suicidal ideas and substance abuse.  All other systems reviewed and are negative.   Blood pressure 140/77, pulse 70, temperature 97.8 F (36.6 C), temperature source Oral, resp. rate 18, height  (1.727 m), weight 89.359 kg (197 lb), SpO2 97 %.Body mass index is  29.96 kg/(m^2).  General Appearance: Casual  Eye Contact::  Good  Speech:  Clear and Coherent  Volume:  Normal  Mood:  Depressed  Affect:  Blunt  Thought Process:  Goal Directed  Orientation:  Full (Time, Place, and Person)  Thought Content:  WDL  Suicidal Thoughts:  Yes.  with intent/plan  Homicidal Thoughts:  No  Memory:  Immediate;   Fair Recent;   Fair Remote;   Fair  Judgement:  Poor  Insight:  Shallow  Psychomotor Activity:  Normal  Concentration:  Fair  Recall:  [employer] of Knowledge:Fair  Language: Fair  Akathisia:  No  Handed:  Right  AIMS (if indicated):     Assets:  Communication Skills Desire for Improvement Housing Physical Health Resilience Social Support  Sleep:  Number of Hours: 5.75  Cognition: WNL  ADL's:  Intact    COGNITIVE FEATURES THAT CONTRIBUTE TO RISK:  None    SUICIDE RISK:   Moderate:  Frequent suicidal ideation with limited intensity, and duration, some specificity in terms of plans, no associated intent, good self-control, limited dysphoria/symptomatology, some risk factors present, and identifiable protective factors, including available and accessible social support.  PLAN OF CARE: Hospital admission, medication management, substance, discharge planning.  Mr. Sarver is a 30 year old male with a history of depression and substance abuse admitted after most likely unintentional overdose on substances.  1. Suicidal ideation. The patient is able to contract for safety in the hospital.  2. Substance abuse. He is positive for benzodiazepines, cocaine, cannabis, and opioids. He is on Suboxone taper.  3. Alcohol abuse. The patient uses alcohol when he has no access to his other drugs. He also uses benzodiazepines. He is on Librium taper.  4. Mood.  The patient endorses symptoms of depression but is uncertain about pharmacotherapy.  5. Substance abuse treatment. He desires to residential treatment. He will meet with a social worker to  discuss options.  6. ID. The patient has been using IV heroine he is asking for ID testing.   7. Smoking. Nicotine patch is available.   8. Disposition. TBE.  I certify that inpatient services furnished can reasonably be expected to improve the patient's condition.   Kristine LineaJolanta Waver Dibiasio, MD 01/29/2016, 8:39 AM

## 2016-01-29 NOTE — H&P (Signed)
Psychiatric Admission Assessment Adult  Patient Identification: Edwin [name] MRN:  [id number] Date of Evaluation:  01/29/2016 Chief Complaint:  Depression Principal Diagnosis: Major depressive disorder, recurrent severe without psychotic features ([place]) Diagnosis:   Patient Active Problem List   Diagnosis Date Noted  . Tobacco use disorder [F17.200] 01/28/2016  . Major depressive disorder, recurrent severe without psychotic features ([place]) [F33.2]   . Opioid dependence with intoxication delirium ([place]) [F11.221]   . Opiate or related narcotic overdose [T40.601A] 11/28/2015  . Substance abuse [F19.10]    History of Present Illness:  Identifying data. Edwin [name] is a 30 year old male with history of depression, mood instability, suicide attempts, and substance abuse.  Chief complaint. "I have to do better."  History of present illness. Information was obtained from the patient and the chart. The patient has a long history of substance abuse with multiple overdoses. He is uncertain whether they are intentional or accidental. He had a serious overdose thousand 15 ended up in a hospital intubated with aspiration pneumonia and resulting pulmonary embolism. He stopped using drugs for a year and a half after that. Then he slowly resumed. His use escalated recently when he has learned that his ex-girlfriend and the mother of his children and is pregnant with another guy. He still loves her. He is very sad because his children were taken away from them due to substance use of the parents. The kids reside with his mother and he sees them frequently but has not been able to provide. He endorses many symptoms of depression with poor sleep, decreased appetite and 45 pound weight loss in the past year, anhedonia, feeling of guilt and hopelessness worthlessness, poor energy and concentration, crying spells, social isolation, escalating drug use, suicidal thinking. He denies psychotic symptoms. He denies  symptoms suggestive of bipolar illness. He denies excessive anxiety. He uses also into drugs with the opiate preference. When he has no access to his opioids he drinks a bottle of vodka that last him through the night.  Past psychiatric history. He has never been hospitalized in a psychiatric hospital. Never participated in substance abuse treatment. He had several overdoses most likely accidental. He has never taken any psychotropic medication.  Family psychiatric history. Father with alcoholism, mother with anxiety.  Social history. He dropped out of high school but that his GED's. He has 2 children that were removed from the house by [place] 5 years ago and lives with his mother. He is ex-wife is now in a new relationship and is pregnant. 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 The license was lost supposedly to unpaid parking tickets. He has some legal charges pending for stealing. 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 All his charges are misdemeanor. There are no felony charges.  Total Time spent with patient: 1 hour  Past Psychiatric History: Depression, mood instability, substance use.  Is the patient at risk to self? Yes.    Has the patient been a risk to self in the past 6 months? Yes.    Has the patient been a risk to self within the distant past? Yes.    Is the patient a risk to others? No.  Has the patient been a risk to others in the past 6 months? No.  Has the patient been a risk to others within the distant past? No.   Prior Inpatient Therapy:   Prior Outpatient  Therapy:    Alcohol Screening: Patient refused Alcohol Screening Tool: Yes 1. How often do you have a drink containing alcohol?: Never 9. Have you or someone else been injured as a result of your drinking?: No 10. Has a relative or friend or a doctor or another health worker been concerned about your drinking or  suggested you cut down?: No Alcohol Use Disorder Identification Test Final Score (AUDIT): 0 Brief Intervention: AUDIT score less than 7 or less-screening does not suggest unhealthy drinking-brief intervention not indicated Substance Abuse History in the last 12 months:  Yes.   Consequences of Substance Abuse: Negative Previous Psychotropic Medications: No  Psychological Evaluations: No  Past Medical History: History reviewed. No pertinent past medical history.  Past Surgical History  Procedure Laterality Date  . Dental surgery     Family History: History reviewed. No pertinent family history. Family Psychiatric  History: Substance abuse and anxiety Tobacco Screening: '@FLOW' ([phone])::1)@ Social History:  History  Alcohol Use  . Yes     History  Drug Use No    Comment: states has stopped using    Additional Social History:                           Allergies:   Allergies  Allergen Reactions  . Vicodin [Hydrocodone-Acetaminophen] Other (See Comments)    Reaction:  GI upset    Lab Results:  Results for orders placed or performed during the hospital encounter of 01/27/16 (from the past 48 hour(s))  Comprehensive metabolic panel     Status: Abnormal   Collection Time: 01/27/16 10:32 PM  Result Value Ref Range   Sodium 137 135 - 145 mmol/L   Potassium 3.6 3.5 - 5.1 mmol/L   Chloride 106 101 - 111 mmol/L   CO2 24 22 - 32 mmol/L   Glucose, Bld 146 (H) 65 - 99 mg/dL   BUN 21 (H) 6 - 20 mg/dL   Creatinine, Ser 1.19 0.61 - 1.24 mg/dL   Calcium 8.9 8.9 - 10.3 mg/dL   Total Protein 7.6 6.5 - 8.1 g/dL   Albumin 4.5 3.5 - 5.0 g/dL   AST 33 15 - 41 U/L   ALT 41 17 - 63 U/L   Alkaline Phosphatase 50 38 - 126 U/L   Total Bilirubin 0.5 0.3 - 1.2 mg/dL   GFR calc non Af Amer >60 >60 mL/min   GFR calc Af Amer >60 >60 mL/min    Comment: (NOTE) The eGFR has been calculated using the CKD EPI equation. This calculation has not been validated in all clinical  situations. eGFR's persistently <60 mL/min signify possible Chronic Kidney Disease.    Anion gap 7 5 - 15  Ethanol (ETOH)     Status: None   Collection Time: 01/27/16 10:32 PM  Result Value Ref Range   Alcohol, Ethyl (B) <5 <5 mg/dL    Comment:        LOWEST DETECTABLE LIMIT FOR SERUM ALCOHOL IS 5 mg/dL FOR MEDICAL PURPOSES ONLY   CBC     Status: Abnormal   Collection Time: 01/27/16 10:32 PM  Result Value Ref Range   WBC 19.5 (H) 3.8 - 10.6 K/uL   RBC 4.93 4.40 - 5.90 MIL/uL   Hemoglobin 15.1 13.0 - 18.0 g/dL   HCT 44.9 40.0 - 52.0 %   MCV 91.1 80.0 - 100.0 fL   MCH 30.7 26.0 - 34.0 pg   MCHC 33.7 32.0 - 36.0 g/dL  RDW 13.0 11.5 - 14.5 %   Platelets 246 063 - 016 K/uL  Salicylate level     Status: None   Collection Time: 01/27/16 10:32 PM  Result Value Ref Range   Salicylate Lvl <0.1 2.8 - 30.0 mg/dL  Acetaminophen level     Status: Abnormal   Collection Time: 01/27/16 10:32 PM  Result Value Ref Range   Acetaminophen (Tylenol), Serum <10 (L) 10 - 30 ug/mL    Comment:        THERAPEUTIC CONCENTRATIONS VARY SIGNIFICANTLY. A RANGE OF 10-30 ug/mL MAY BE AN EFFECTIVE CONCENTRATION FOR MANY PATIENTS. HOWEVER, SOME ARE BEST TREATED AT CONCENTRATIONS OUTSIDE THIS RANGE. ACETAMINOPHEN CONCENTRATIONS >150 ug/mL AT 4 HOURS AFTER INGESTION AND >50 ug/mL AT 12 HOURS AFTER INGESTION ARE OFTEN ASSOCIATED WITH TOXIC REACTIONS.   Urine Drug Screen, Qualitative ([facility] only)     Status: Abnormal   Collection Time: 01/27/16 10:33 PM  Result Value Ref Range   Tricyclic, Ur Screen NONE DETECTED NONE DETECTED   Amphetamines, Ur Screen NONE DETECTED NONE DETECTED   MDMA (Ecstasy)Ur Screen NONE DETECTED NONE DETECTED   Cocaine Metabolite,Ur [place] POSITIVE (A) NONE DETECTED   Opiate, Ur Screen POSITIVE (A) NONE DETECTED   Phencyclidine (PCP) Ur S NONE DETECTED NONE DETECTED   Cannabinoid 50 Ng, Ur [place] POSITIVE (A) NONE DETECTED   Barbiturates, Ur Screen NONE DETECTED NONE DETECTED    Benzodiazepine, Ur Scrn POSITIVE (A) NONE DETECTED   Methadone Scn, Ur NONE DETECTED NONE DETECTED    Comment: (NOTE) 093  Tricyclics, urine               Cutoff 1000 ng/mL 200  Amphetamines, urine             Cutoff 1000 ng/mL 300  MDMA (Ecstasy), urine           Cutoff 500 ng/mL 400  Cocaine Metabolite, urine       Cutoff 300 ng/mL 500  Opiate, urine                   Cutoff 300 ng/mL 600  Phencyclidine (PCP), urine      Cutoff 25 ng/mL 700  Cannabinoid, urine              Cutoff 50 ng/mL 800  Barbiturates, urine             Cutoff 200 ng/mL 900  Benzodiazepine, urine           Cutoff 200 ng/mL 1000 Methadone, urine                Cutoff 300 ng/mL 1100 1200 The urine drug screen provides only a preliminary, unconfirmed 1300 analytical test result and should not be used for non-medical 1400 purposes. Clinical consideration and professional judgment should 1500 be applied to any positive drug screen result due to possible 1600 interfering substances. A more specific alternate chemical method 1700 must be used in order to obtain a confirmed analytical result.  1800 Gas chromato graphy / mass spectrometry (GC/MS) is the preferred 1900 confirmatory method.     Blood Alcohol level:  Lab Results  Component Value Date   [facility] <5 01/27/2016   ETH <5 23/55/7322    Metabolic Disorder Labs:  Lab Results  Component Value Date   HGBA1C 5.8 01/21/2014   No results found for: PROLACTIN Lab Results  Component Value Date   TRIG 124 02/11/2014    Current Medications: Current Facility-Administered Medications  Medication Dose Route Frequency  Provider Last Rate Last Dose  . acetaminophen (TYLENOL) tablet 650 mg  650 mg Oral Q6H PRN Rainey Pines, MD      . alum & mag hydroxide-simeth (MAALOX/MYLANTA) 200-200-20 MG/5ML suspension 30 mL  30 mL Oral Q4H PRN Rainey Pines, MD   30 mL at 01/29/16 0235  . buprenorphine (SUBUTEX) SL tablet 8 mg  8 mg Sublingual Daily [facility] [place] B Sanskriti Greenlaw, MD   8 mg  at 01/28/16 1708  . chlordiazePOXIDE (LIBRIUM) capsule 25 mg  25 mg Oral TID Rainey Pines, MD   25 mg at 01/28/16 2217  . magnesium hydroxide (MILK OF MAGNESIA) suspension 30 mL  30 mL Oral Daily PRN Rainey Pines, MD      . nicotine (NICODERM CQ - dosed in mg/24 hours) patch 21 mg  21 mg Transdermal Daily Aylyn Wenzler B Triniti Gruetzmacher, MD   21 mg at 01/28/16 1828  . traZODone (DESYREL) tablet 100 mg  100 mg Oral QHS PRN Rainey Pines, MD   100 mg at 01/28/16 2217   PTA Medications: Prescriptions prior to admission  Medication Sig Dispense Refill Last Dose  . Naloxone HCl (EVZIO) 2 MG/0.4ML SOAJ Inject 2 mg as directed as needed (for opiate overdose and unconsciousness). 2 Package 0     Musculoskeletal: Strength & Muscle Tone: within normal limits Gait & Station: normal Patient leans: N/A  Psychiatric Specialty Exam: I reviewed physical exam performed in the emergency room and angry with the findings. Physical Exam  Nursing note and vitals reviewed.   Review of Systems  Gastrointestinal: Positive for nausea.  Psychiatric/Behavioral: Positive for depression, suicidal ideas and substance abuse.  All other systems reviewed and are negative.   Blood pressure 140/77, pulse 70, temperature 97.8 F (36.6 C), temperature source Oral, resp. rate 18, height '5\' 8"'  (1.727 m), weight 89.359 kg (197 lb), SpO2 97 %.Body mass index is 29.96 kg/(m^2).  See SRA.                                                  Sleep:  Number of Hours: 5.75     Treatment Plan Summary: Daily contact with patient to assess and evaluate symptoms and progress in treatment and Medication management   Mr. Bara is a 30 year old male with a history of depression and substance abuse admitted after most likely unintentional overdose on substances.  1. Suicidal ideation. The patient is able to contract for safety in the hospital.  2. Substance abuse. He is positive for benzodiazepines, cocaine, cannabis, and  opioids. He is on Suboxone taper.  3. Alcohol abuse. The patient uses alcohol when he has no access to his other drugs. He also uses benzodiazepines. He is on Librium taper.  4. Mood. The patient endorses symptoms of depression but is uncertain about pharmacotherapy.  5. Substance abuse treatment. He desires to residential treatment. He will meet with a social worker to discuss options.  6. ID. The patient has been using IV heroine he is asking for ID testing.   7. Smoking. Nicotine patch is available.   8. Disposition. TBE.   Observation Level/Precautions:  Detox 15 minute checks  Laboratory:  CBC Chemistry Profile UDS UA  Psychotherapy:    Medications:    Consultations:    Discharge Concerns:    Estimated LOS:  Other:     I certify that inpatient services furnished can  reasonably be expected to improve the patient's condition.    Orson Slick, MD 3/31/20178:48 AM

## 2016-01-29 NOTE — Progress Notes (Signed)

## 2016-01-29 NOTE — Plan of Care (Signed)
Problem: Ineffective individual coping Goal: LTG: Patient will report a decrease in negative feelings Outcome: Progressing Patient states he has no negative feelings at this time. Instructed to notify nurse if having negative feelings. He states understanding Tax adviserCTownsend RN

## 2016-01-29 NOTE — Progress Notes (Signed)
Recreation Therapy Notes  INPATIENT RECREATION THERAPY ASSESSMENT  Patient Details Name: Edwin [name] MRN: [id number] DOB: Feb 20, 1986 Today's Date: 01/29/2016  Patient Stressors: Family, Death, Other (Comment) (Trying to repair relationship with family; cousin hung himself 8 years ago; kids taken away in 2012 to live with mother)  Coping Skills:   Isolate, Substance Abuse, Avoidance, Exercise, Music, Sports, Other (Comment) (Being around kids)  Personal Challenges: Communication, Concentration, Decision-Making, Expressing Yourself, Problem-Solving, Relationships, Self-Esteem/Confidence, Social Interaction, Substance Abuse, Time Management, Trusting Others  Leisure Interests (2+):  Sports - Basketball, Individual - Other (Comment) (Spend time with kids, throw football, joke)  Awareness of Community Resources:  Yes  Community Resources:  Gym  Current Use: No  If no, Barriers?: Social  Patient Strengths:  Sense of humor  Patient Identified Areas of Improvement:  Be happy sober  Current Recreation Participation:  Watch TV, talkng to kids  Patient Goal for Hospitalization:  To reprogram brain  [place]  County of Residence:  [place]   Current SI (including self-harm):  No  Current HI:  No  Consent to Intern Participation: N/A   Jacquelynn CreeGreene,Jeyla Bulger M, LRT/CTRS 01/29/2016, 4:54 PM

## 2016-01-30 LAB — CBC WITH DIFFERENTIAL/PLATELET
BASOS PCT: 1 %
Basophils Absolute: 0.1 10*3/uL (ref 0–0.1)
Eosinophils Absolute: 0.5 10*3/uL (ref 0–0.7)
Eosinophils Relative: 6 %
HEMATOCRIT: 42 % (ref 40.0–52.0)
HEMOGLOBIN: 14.5 g/dL (ref 13.0–18.0)
LYMPHS ABS: 4.1 10*3/uL — AB (ref 1.0–3.6)
Lymphocytes Relative: 47 %
MCH: 31.7 pg (ref 26.0–34.0)
MCHC: 34.6 g/dL (ref 32.0–36.0)
MCV: 91.6 fL (ref 80.0–100.0)
MONOS PCT: 11 %
Monocytes Absolute: 1 10*3/uL (ref 0.2–1.0)
NEUTROS ABS: 3.1 10*3/uL (ref 1.4–6.5)
NEUTROS PCT: 35 %
Platelets: 236 10*3/uL (ref 150–440)
RBC: 4.59 MIL/uL (ref 4.40–5.90)
RDW: 13 % (ref 11.5–14.5)
WBC: 8.8 10*3/uL (ref 3.8–10.6)

## 2016-01-30 LAB — HEPATITIS C ANTIBODY: HCV Ab: <0.1 s/co ratio (ref 0.0–0.9)

## 2016-01-30 LAB — HIV ANTIBODY (ROUTINE TESTING W REFLEX): HIV Screen 4th Generation wRfx: NONREACTIVE

## 2016-01-30 LAB — RPR: RPR Ser Ql: NONREACTIVE

## 2016-01-30 MED ORDER — TRAZODONE HCL 50 MG PO TABS
150.0000 mg | ORAL_TABLET | Freq: Every evening | ORAL | Status: DC | PRN
  Administered 2016-01-30 – 2016-02-02 (×4): 150 mg via ORAL
  Filled 2016-01-30 (×4): qty 1

## 2016-01-30 MED ORDER — PROSIGHT PO TABS
1.0000 | ORAL_TABLET | Freq: Every day | ORAL | Status: DC
  Filled 2016-01-30: qty 1

## 2016-01-30 MED ORDER — VITAMIN B-1 100 MG PO TABS
100.0000 mg | ORAL_TABLET | Freq: Every day | ORAL | Status: AC
  Administered 2016-01-30 – 2016-02-03 (×5): 100 mg via ORAL
  Filled 2016-01-30 (×5): qty 1

## 2016-01-30 MED ORDER — CITALOPRAM HYDROBROMIDE 20 MG PO TABS
20.0000 mg | ORAL_TABLET | Freq: Every day | ORAL | Status: DC
  Administered 2016-01-30 – 2016-02-03 (×5): 20 mg via ORAL
  Filled 2016-01-30 (×5): qty 1

## 2016-01-30 MED ORDER — OCUVITE-LUTEIN PO CAPS
1.0000 | ORAL_CAPSULE | Freq: Every day | ORAL | Status: DC
  Administered 2016-01-30 – 2016-02-03 (×5): 1 via ORAL
  Filled 2016-01-30 (×5): qty 1

## 2016-01-30 NOTE — Progress Notes (Signed)
Pt noted this am sitting in room looking out window, reports not feeling to well. Morning medications given, pt reports feeling better. Pleasant and cooperative to staff and and pleasant with peers. Did attend some group today. Pt denies SI, HI, AVH, but does have some cravings. Reports sleeping poorly, and having a poor appetite. Rates depression at 8, hopelessness and anxiety at 10. Pt goal to day is to learn something new and to make someone happy or feel special.  Encouragement and support offered. Pt receptive. Visible in mileu. Pt remains safe on unit with q15 min checks

## 2016-01-30 NOTE — Plan of Care (Signed)
Problem: Ineffective individual coping Goal: STG: Patient will remain free from self harm Outcome: Progressing No self harm reported or observed     

## 2016-01-30 NOTE — [facility] Group Notes (Signed)
[facility] Group Notes:  (Nursing/MHT/Case Management/Adjunct)  Date:  01/30/2016  Time:  9:21 AM  Type of Therapy:  Community   Participation Level:  Did Not Attend  Edwin [name] 01/30/2016, 9:21 AM

## 2016-01-30 NOTE — [facility] Group Notes (Signed)
[facility] LCSW Group Therapy  01/30/2016 3:26 PM  Type of Therapy:  Group Therapy  Participation Level:  Minimal  Participation Quality:  Attentive  Affect:  Appropriate  Cognitive:  Alert  Insight:  Improving  Engagement in Therapy:  Improving  Modes of Intervention:  Discussion, Education, Socialization and Support  Summary of Progress/Problems: Pt will identify unhealthy thoughts and how they impact their emotions and behavior. Pt will be encouraged to discuss these thoughts, emotions and behaviors with the group. Pt attended group and stayed the entire time. Pt sat quietly and listened to other group members.   Adolpho Meenach L Reiko Vinje MSW, LCSWA  01/30/2016, 3:26 PM  

## 2016-01-30 NOTE — [facility] Counselor (Signed)
Adult Comprehensive Assessment  Patient ID: Edwin [name], male   DOB: 10/12/1986, 30 y.o.   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  Family Relationships: Pt's children were taken from his custody 5 years ago. They are with his mother.  Financial / Lack of resources (include bankruptcy): Limited income.  Housing / Lack of housing: Pt lives with grandmother but is not sure if he can return.  Physical health (include injuries & life threatening diseases): Pt has overdosed unintentionally over 20 times per patient.  Social relationships: None reported  Substance abuse: Pt reports abusing heroin, pain pills, cociane, benzos and marijuana.  Bereavement / Loss: Lost custody of children 5 years ago.   Living/Environment/Situation:  Living Arrangements: Other relatives Database administrator(Grandmother) Living conditions (as described by patient or guardian): Pt lives with grandmother but does not know if he can return.  How long has patient lived in current situation?: 4 years.  What is atmosphere in current home: Supportive, Comfortable  Family History:  Marital status: Single Are you sexually active?: Yes What is your sexual orientation?: Heterosexual  Has your sexual activity been affected by drugs, alcohol, medication, or emotional stress?: None reported  Does patient have children?: Yes How many children?: 2 How is patient's relationship with their children?: 2 sons, pt does not have custody of them. They live with his mother but he sees them frequently.   Childhood History:  By whom was/is the patient raised?: Both parents Additional childhood history information: Parents divorced.  Description of patient's relationship with caregiver when they were a child: "they did what they wanted."  Patient's description of current  relationship with people who raised him/her: strained relationship with parents.  How were you disciplined when you got in trouble as a child/adolescent?: Physical  Does patient have siblings?: Yes Number of Siblings: 2 Description of patient's current relationship with siblings: Close with brothers.  Did patient suffer any verbal/emotional/physical/sexual abuse as a child?: No Did patient suffer from severe childhood neglect?: No Has patient ever been sexually abused/assaulted/raped as an adolescent or adult?: No Was the patient ever a victim of a crime or a disaster?: No Witnessed domestic violence?: No Has patient been effected by domestic violence as an adult?: No  Education:  Highest grade of school patient has completed: GED Currently a Consulting civil engineerstudent?: No Learning disability?: No  Employment/Work Situation:   Employment situation: Unemployed What is the longest time patient has a held a job?: 6 years but was fired after going to jail.  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  Has patient ever been in the [employer] and Companymilitary?: No  Financial Resources:   Financial resources: Support from parents / caregiver Does patient have a Lawyerrepresentative payee or guardian?: No  Alcohol/Substance Abuse:   What has been your use of drugs/alcohol within the last 12 months?: Pt reoprts injecting opiates and cocaine daily. He abuses benzos and marijuana.  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  In what areas does patient  struggle / problems for patient: substance abuse, depression, SI   Discharge Plan:   Does patient have access to transportation?: Yes Will patient be returning to same living situation after discharge?: No Plan for living situation after discharge: Pt would like inpatient substance abuse treatment  Currently receiving community mental health services: No If no, would patient like referral for services when discharged?: Yes (What [place] ) Does patient have financial barriers related to discharge medications?: Yes Patient description of barriers related to discharge medications: No income, no insurance.   Summary/Recommendations:   Patient is a 30 year old male admitted  with a diagnosis of Major depression. Patient presented to the hospital with SI, depression and substance abuse. Patient reports primary triggers for admission were relationship issues and substance abuse. Patient will benefit from crisis stabilization, medication evaluation, group therapy and psycho education in addition to case management for discharge. At discharge, it is recommended that patient remain compliant with established discharge plan and continued treatment.   Edwin [name]. MSW, [facility]  01/30/2016

## 2016-01-30 NOTE — Progress Notes (Signed)
[facility] MD Progress Note  01/30/2016 9:04 AM CURTEZ BRALLIER  MRN:  [id number] Subjective:    The patient was initially very animated and lighthearted with this writer then became sad and tearful when talking about his spiraling drug use and how his grandmother and father judge him for the substance use.He does not even want them to visit because he does not like the way that they look at him. He feels excessively guilty about the drug use as well. The patient says he has had a lot of strong urges to use and yesterday really wanted to drink. He says even getting blood work reminds him of shooting drugs and triggers urges. He is endorsing some feelings of sadness and loneliness that he says contributes to wanting to use. He has been separated from his girlfriend and mother of his children since May of last year. He denies any current active or passive suicidal thoughts but does admit to some suicidal thoughts prior to admission and says he does not care whether not he lived. He denies any auditory or visual hallucinations. No paranoid thoughts or delusions. He says he is feeling a bit nauseated and having some mild withdrawal symptoms prior to getting Suboxone this morning. He says he always feels worse in the morning. He denies any diarrhea or vomiting. No tremors. The patient has been compliant with medications on the unit and there has been no agitation or violence. He is in favor going to residential substance treatment program time. He slept fairly well last night over 5 hours and appetite is fair.  Supportive psychotherapy provided and Times and discussing negative consequences of chronic on health as well as socially. Times spent trying to help motivate the patient to engage in a meaningful recovery program so that he can back in his childrens lives.   Past psychiatric history. He has never been hospitalized in a psychiatric hospital. Never participated in substance abuse treatment. He had several  overdoses most likely accidental. He has never taken any psychotropic medication.  Family psychiatric history. Father with alcoholism, mother with anxiety.  Social history. The patient was born and raised in [place] by his father and grandmother primarily. He dropped out of high school but then later got his GED's. He has 2 children that were removed from the house by DSS 5 years ago and lives with his mother. The patient currently lives with his grandmother. He never married the mother of his 2 children. 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 The license was lost supposedly to unpaid parking tickets. The patient is not currently in a relationship or dating.  Legal History:  He has some legal charges pending for stealing. He supports himself selling drugs, stealing, hustling. He was in jail for breaking someone's job in a Archivist. All his charges are misdemeanor. He currently has a pending charge for larceny and reckless driving. There are no felony charges.    Principal Problem: Major depressive disorder, recurrent severe without psychotic features (HCC) Diagnosis:   Patient Active Problem List   Diagnosis Date Noted  . Tobacco use disorder [F17.200] 01/28/2016  . 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 Opioid dependence with intoxication delirium (HCC) [F11.221]   . Opiate or related narcotic overdose [T40.601A] 11/28/2015  . Substance abuse [F19.10]    Total Time spent with patient: 30 minutes   Past Medical History: History reviewed. No pertinent past medical history.  Past Surgical History  Procedure Laterality Date  . Dental surgery     Family History: History reviewed. No pertinent family history.   Social History:  History  Alcohol Use  . Yes     History  Drug Use No    Comment: states has stopped using    Social History   Social History  . Marital Status: Single    Spouse Name:  N/A  . Number of Children: N/A  . Years of Education: N/A   Social History Main Topics  . Smoking status: Current Every Day Smoker  . Smokeless tobacco: None  . Alcohol Use: Yes  . Drug Use: No     Comment: states has stopped using  . Sexual Activity: Not Asked   Other Topics Concern  . None   Social History Narrative   =          Sleep: Fair  Appetite:  Fair  Current Medications: Current Facility-Administered Medications  Medication Dose Route Frequency Provider Last Rate Last Dose  . acetaminophen (TYLENOL) tablet 650 mg  650 mg Oral Q6H PRN Brandy Hale, MD      . alum & mag hydroxide-simeth (MAALOX/MYLANTA) 200-200-20 MG/5ML suspension 30 mL  30 mL Oral Q4H PRN Brandy Hale, MD   30 mL at 01/30/16 0651  . chlordiazePOXIDE (LIBRIUM) capsule 25 mg  25 mg Oral TID Brandy Hale, MD   25 mg at 01/30/16 0901  . citalopram (CELEXA) tablet 20 mg  20 mg Oral Daily Darliss Ridgel, MD   20 mg at 01/30/16 0901  . magnesium hydroxide (MILK OF MAGNESIA) suspension 30 mL  30 mL Oral Daily PRN Brandy Hale, MD      . multivitamin (PROSIGHT) tablet 1 tablet  1 tablet Oral Daily Darliss Ridgel, MD      . nicotine (NICODERM CQ - dosed in mg/24 hours) patch 21 mg  21 mg Transdermal Daily Jolanta B Pucilowska, MD   21 mg at 01/30/16 0901  . thiamine (VITAMIN B-1) tablet 100 mg  100 mg Oral Daily Darliss Ridgel, MD   100 mg at 01/30/16 0901  . traZODone (DESYREL) tablet 100 mg  100 mg Oral QHS PRN Brandy Hale, MD   100 mg at 01/29/16 2126    Lab Results:  Results for orders placed or performed during the hospital encounter of 01/28/16 (from the past 48 hour(s))  RPR     Status: None   Collection Time: 01/29/16  3:37 PM  Result Value Ref Range   RPR Ser Ql Non Reactive Non Reactive    Comment: (NOTE) Performed At: [facility] [address] [place], [place] [id number] Mila Homer MD WU:[id number]   HIV antibody     Status: None   Collection Time: 01/29/16  3:37 PM  Result  Value Ref Range   HIV Screen 4th Generation wRfx Non Reactive Non Reactive    Comment: (NOTE) Performed At: [facility] [address] [place], [place] [id number] Mila Homer MD VH:[id number]   Hepatitis C antibody     Status: None   Collection Time: 01/29/16  3:37 PM  Result Value Ref Range   HCV Ab <0.1 0.0 - 0.9 s/co ratio    Comment: (NOTE)                                  Negative:     < 0.8  Indeterminate: 0.8 - 0.9                                  Positive:     > 0.9 The CDC recommends that a positive HCV antibody result be followed up with a HCV Nucleic Acid Amplification test ([id number]). Performed At: [facility] [address] [place], [place] [id number] Mila HomerHancock William F MD OZ:[phone]     Blood Alcohol level:  Lab Results  Component Value Date   [facility] <5 01/27/2016   ETH <5 12/24/2015    Physical Findings: AIMS: Facial and Oral Movements Muscles of Facial Expression: None, normal Lips and Perioral Area: None, normal Jaw: None, normal Tongue: None, normal,Extremity Movements Upper (arms, wrists, hands, fingers): None, normal Lower (legs, knees, ankles, toes): None, normal, Trunk Movements Neck, shoulders, hips: None, normal, Overall Severity Severity of abnormal movements (highest score from questions above): None, normal Incapacitation due to abnormal movements: None, normal Patient's awareness of abnormal movements (rate only patient's report): Aware, no distress, Dental Status Current problems with teeth and/or dentures?: No Does patient usually wear dentures?: No  CIWA:    COWS:     Musculoskeletal: Strength & Muscle Tone: within normal limits Gait & Station: normal Patient leans: N/A  Psychiatric Specialty Exam: Review of Systems  Constitutional: Positive for weight loss and malaise/fatigue. Negative for fever, chills and diaphoresis.       He has had a 45 lb weight loss over the past one year   HENT: Negative.  Negative for ear pain, hearing loss, nosebleeds, sore throat and tinnitus.   Eyes: Negative.  Negative for blurred vision, double vision, photophobia, pain, discharge and redness.  Respiratory: Negative.  Negative for cough, hemoptysis, sputum production, shortness of breath and wheezing.   Cardiovascular: Negative.  Negative for chest pain, palpitations, orthopnea, claudication and leg swelling.  Gastrointestinal: Positive for nausea. Negative for heartburn, vomiting, abdominal pain, diarrhea, constipation and blood in stool.  Genitourinary: Negative.  Negative for dysuria, urgency and frequency.  Musculoskeletal: Negative.  Negative for myalgias, back pain, joint pain, falls and neck pain.  Skin: Negative for itching and rash.  Neurological: Negative.  Negative for dizziness, tingling, tremors, sensory change, speech change, focal weakness, seizures, weakness and headaches.  Endo/Heme/Allergies: Negative.  Negative for environmental allergies and polydipsia. Does not bruise/bleed easily.    Blood pressure 126/77, pulse 72, temperature 97.8 F (36.6 C), temperature source Oral, resp. rate 18, height 5\' 8"  (1.727 m), weight 89.359 kg (197 lb), SpO2 97 %.Body mass index is 29.96 kg/(m^2).  General Appearance: Casual  Eye Contact::  Good  Speech:  Clear and Coherent and Normal Rate  Volume:  Normal  Mood:  Depressed  Affect:  Depressed  Thought Process:  Coherent, Linear and Logical  Orientation:  Full (Time, Place, and Person)  Thought Content:  Negative  Suicidal Thoughts:  No  Homicidal Thoughts:  No  Memory:  Immediate;   Good Recent;   Good Remote;   Good  Judgement:  Good, But poor with regards to susbtance use  Insight:  Fair  Psychomotor Activity:  Normal  Concentration:  Good  Recall:  Good  Fund of Knowledge:Good  Language: Good  Akathisia:  Negative  Handed:  Right  AIMS (if indicated):     Assets:  Communication Skills Desire for  Improvement Housing Physical Health Social Support Transportation  ADL's:  Intact  Cognition: WNL  Sleep:  Number of  Hours: 6.15   Treatment Plan Summary:  Major Depression, Mild, Single Episode Alcohol Use Disorder Cocaine Use Disorder Opioid Use Disorder Cannabis Use Disorder Benzodiazepine Use Disorder  Mr. Delaguila is a 30 year old male with a history of depression and substance abuse admitted after most likely unintentional overdose on substances.  1. Major Depression, Mild: The patient is exhibiting some symptoms of sadness and loneliness and does admit to crying spells prior to admission. Will start Celexa 20 g by mouth daily for now for depression. He may have higher levels of anxiety once he is clean and sober.    2. Alcohol Use Disorder, Cocaine Use Disorder, Opioid Use Disorder, Cannabis Use Disorder, Benzodiazepine Use Disorder He is positive for benzodiazepines, cocaine, cannabis, and opioids.  the patient was placed on Librium 25 mg by mouth 3 times a day for alcohol withdrawal symptoms. He is also now on a Suboxone taper. He is also on Suboxone taper.The patient was advised to abstain from alcohol and all illicit drugs as they may worsen mood symptoms. Times spent encouraging the patient to enter a meaningful recovery program and he is interested in residential substance abuse treatment.   3. ID: HIV and hepatitis C were negative. The patient was educated about negative consequences of IV drug use and risk of diseases.  4. Smoking. Nicotine patch is available.   5: Disposition: The patient would benefit from residential substance abuse treatment. Will refer to [facility].    Daily contact with patient to assess and evaluate symptoms and progress in treatment and Medication management  Levora Angel, MD 01/30/2016, 9:04 AM

## 2016-01-31 MED ORDER — BUPRENORPHINE HCL 2 MG SL SUBL: 2.0000 mg | SUBLINGUAL_TABLET | Freq: Every day | SUBLINGUAL | Status: DC

## 2016-01-31 MED ORDER — BUPRENORPHINE HCL 2 MG SL SUBL
4.0000 mg | SUBLINGUAL_TABLET | Freq: Every day | SUBLINGUAL | Status: AC
  Administered 2016-01-31: 4 mg via SUBLINGUAL
  Filled 2016-01-31: qty 2

## 2016-01-31 MED ORDER — BUPRENORPHINE HCL 2 MG SL SUBL
2.0000 mg | SUBLINGUAL_TABLET | Freq: Every day | SUBLINGUAL | Status: AC
  Administered 2016-02-01: 2 mg via SUBLINGUAL
  Filled 2016-01-31: qty 1

## 2016-01-31 NOTE — Progress Notes (Signed)
[facility] MD Progress Note  01/31/2016 10:15 AM MIKIAS LANZ  MRN:  [id number] Subjective:   The patient is experiencing severe withdrawal symptoms today including shakes, tremors, abdominal cramps and muscle cramps in his arms and legs. He does report nausea but no vomiting. He has been reclusive to his room this morning because of not feeling well physically. He was able to have a few bites to eat for breakfast however. The patient received 8 mg of Suboxone on Friday and then no Suboxone on Saturday. He was given 4 mg of Suboxone this morning. He says he remains committed to completely detoxing and going to residential substance abuse treatment. Mood remains depressed and the patient talked about being single and how it has contributed to his drug use. He also talked about feelings of low self-worth and low self-esteem contracting to depression. The patient feels like he is disappointed his family and was very negative in his thinking. The patient slept 5 hours last night. Vital signs are stable.  Supportive psychotherapy provided a lot of time spent helping the patient to try and work on making positive changes in his life and the need to enter a meaningful recovery program.  Past psychiatric history. He has never been hospitalized in a psychiatric hospital. Never participated in substance abuse treatment. He had several overdoses most likely accidental. He has never taken any psychotropic medication.  Family psychiatric history. Father with alcoholism, mother with anxiety.  Social history. The patient was born and raised in [place] by his father and grandmother primarily. He dropped out of high school but then later got his GED's. He has 2 children that were removed from the house by DSS 5 years ago and lives with his mother. The patient currently lives with his grandmother. He never married the mother of his 2 children. 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 The  license was lost supposedly to unpaid parking tickets. The patient is not currently in a relationship or dating.  Legal History:  He has some legal charges pending for stealing. He supports himself selling drugs, stealing, hustling. He was in jail for breaking someone's job in a Arch[employer]. All his charges are misdemeanor. He currently has a pending charge for larceny and reckless driving. There are no felony charges.    Principal Problem: Major depressive disorder, recurrent severe without psychotic features (HCC) Diagnosis:   Patient Active Problem List   Diagnosis Date Noted  . Tobacco use disorder [F17.200] 01/28/2016  . 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 Opioid dependence with intoxication delirium (HCC) [F11.221]   . Opiate or related narcotic overdose [T40.601A] 11/28/2015  . Substance abuse [F19.10]    Total Time spent with patient: 30 minutes   Past Medical History: History reviewed. No pertinent past medical history.  Past Surgical History  Procedure Laterality Date  . Dental surgery     Family History: History reviewed. No pertinent family history.   Social History:  History  Alcohol Use  . Yes     History  Drug Use No    Comment: states has stopped using    Social History   Social History  . Marital Status: Single    Spouse Name: N/A  . Number of Children: N/A  . Years of Education: N/A   Social History Main Topics  . Smoking status: Current Every Day Smoker  . Smokeless tobacco: None  . Alcohol Use: Yes  .  Drug Use: No     Comment: states has stopped using  . Sexual Activity: Not Asked   Other Topics Concern  . None   Social History Narrative   =          Sleep: Fair  Appetite:  Fair  Current Medications: Current Facility-Administered Medications  Medication Dose Route Frequency Provider Last Rate Last Dose  . acetaminophen (TYLENOL) tablet 650 mg  650 mg Oral Q6H PRN Brandy HaleUzma Faheem, MD       . alum & mag hydroxide-simeth (MAALOX/MYLANTA) 200-200-20 MG/5ML suspension 30 mL  30 mL Oral Q4H PRN Brandy HaleUzma Faheem, MD   30 mL at 01/30/16 2204  . chlordiazePOXIDE (LIBRIUM) capsule 25 mg  25 mg Oral TID Brandy HaleUzma Faheem, MD   25 mg at 01/31/16 0925  . citalopram (CELEXA) tablet 20 mg  20 mg Oral Daily Darliss RidgelAarti K Amor Packard, MD   20 mg at 01/31/16 0925  . magnesium hydroxide (MILK OF MAGNESIA) suspension 30 mL  30 mL Oral Daily PRN Brandy HaleUzma Faheem, MD      . multivitamin-lutein (OCUVITE-LUTEIN) capsule 1 capsule  1 capsule Oral Daily Cy Blamerllison K Tharakan, RPH   1 capsule at 01/31/16 0925  . nicotine (NICODERM CQ - dosed in mg/24 hours) patch 21 mg  21 mg Transdermal Daily Shari ProwsJolanta B Pucilowska, MD   21 mg at 01/31/16 0927  . thiamine (VITAMIN B-1) tablet 100 mg  100 mg Oral Daily Darliss RidgelAarti K Ciana Simmon, MD   100 mg at 01/31/16 0925  . traZODone (DESYREL) tablet 150 mg  150 mg Oral QHS PRN Darliss RidgelAarti K Serinity Ware, MD   150 mg at 01/30/16 2203    Lab Results:  Results for orders placed or performed during the hospital encounter of 01/28/16 (from the past 48 hour(s))  RPR     Status: None   Collection Time: 01/29/16  3:37 PM  Result Value Ref Range   RPR Ser Ql Non Reactive Non Reactive    Comment: (NOTE) Performed At: [facility] [address] [place], [place] [id number] Mila HomerHancock William F MD WU:[id number]:[phone]   HIV antibody     Status: None   Collection Time: 01/29/16  3:37 PM  Result Value Ref Range   HIV Screen 4th Generation wRfx Non Reactive Non Reactive    Comment: (NOTE) Performed At: [facility] [address] [place], [place] [id number] Mila HomerHancock William F MD VH:[id number]:[phone]   Hepatitis C antibody     Status: None   Collection Time: 01/29/16  3:37 PM  Result Value Ref Range   HCV Ab <0.1 0.0 - 0.9 s/co ratio    Comment: (NOTE)                                  Negative:     < 0.8                             Indeterminate: 0.8 - 0.9                                  Positive:     > 0.9 The CDC recommends  that a positive HCV antibody result be followed up with a HCV Nucleic Acid Amplification test ([id number]). Performed At: [facility] [place] [address] [place], [place] [id number] Mila HomerHancock William F MD UY:[id number]:[phone]  CBC with Differential/Platelet     Status: Abnormal   Collection Time: 01/30/16  9:35 AM  Result Value Ref Range   WBC 8.8 3.8 - 10.6 K/uL   RBC 4.59 4.40 - 5.90 MIL/uL   Hemoglobin 14.5 13.0 - 18.0 g/dL   HCT 08.6 57.8 - 46.9 %   MCV 91.6 80.0 - 100.0 fL   MCH 31.7 26.0 - 34.0 pg   MCHC 34.6 32.0 - 36.0 g/dL   RDW 62.9 52.8 - 41.3 %   Platelets 236 150 - 440 K/uL   Neutrophils Relative % 35 %   Neutro Abs 3.1 1.4 - 6.5 K/uL   Lymphocytes Relative 47 %   Lymphs Abs 4.1 (H) 1.0 - 3.6 K/uL   Monocytes Relative 11 %   Monocytes Absolute 1.0 0.2 - 1.0 K/uL   Eosinophils Relative 6 %   Eosinophils Absolute 0.5 0 - 0.7 K/uL   Basophils Relative 1 %   Basophils Absolute 0.1 0 - 0.1 K/uL    Blood Alcohol level:  Lab Results  Component Value Date   ETH <5 01/27/2016   ETH <5 12/24/2015    Physical Findings: AIMS: Facial and Oral Movements Muscles of Facial Expression: None, normal Lips and Perioral Area: None, normal Jaw: None, normal Tongue: None, normal,Extremity Movements Upper (arms, wrists, hands, fingers): None, normal Lower (legs, knees, ankles, toes): None, normal, Trunk Movements Neck, shoulders, hips: None, normal, Overall Severity Severity of abnormal movements (highest score from questions above): None, normal Incapacitation due to abnormal movements: None, normal Patient's awareness of abnormal movements (rate only patient's report): Aware, no distress, Dental Status Current problems with teeth and/or dentures?: No Does patient usually wear dentures?: No  CIWA:    COWS:     Musculoskeletal: Strength & Muscle Tone: within normal limits Gait & Station: normal Patient leans: N/A  Psychiatric Specialty Exam: Review of Systems   Constitutional: Positive for weight loss and malaise/fatigue. Negative for fever, chills and diaphoresis.       He has had a 45 lb weight loss over the past one year  HENT: Negative.  Negative for ear pain, hearing loss, nosebleeds, sore throat and tinnitus.   Eyes: Negative.  Negative for blurred vision, double vision, photophobia, pain, discharge and redness.  Respiratory: Negative.  Negative for cough, hemoptysis, sputum production, shortness of breath and wheezing.   Cardiovascular: Negative.  Negative for chest pain, palpitations, orthopnea, claudication and leg swelling.  Gastrointestinal: Positive for nausea. Negative for heartburn, vomiting, abdominal pain, diarrhea, constipation and blood in stool.  Genitourinary: Negative.  Negative for dysuria, urgency and frequency.  Musculoskeletal: Negative.  Negative for myalgias, back pain, joint pain, falls and neck pain.  Skin: Negative for itching and rash.  Neurological: Negative.  Negative for dizziness, tingling, tremors, sensory change, speech change, focal weakness, seizures, weakness and headaches.  Endo/Heme/Allergies: Negative.  Negative for environmental allergies and polydipsia. Does not bruise/bleed easily.    Blood pressure 129/78, pulse 70, temperature 97.9 F (36.6 C), temperature source Oral, resp. rate 18, height 5\' 8"  (1.727 m), weight 89.359 kg (197 lb), SpO2 97 %.Body mass index is 29.96 kg/(m^2).  General Appearance: Casual  Eye Contact::  Good  Speech:  Clear and Coherent and Normal Rate  Volume:  Normal  Mood:  Depressed  Affect:  Depressed  Thought Process:  Coherent, Linear and Logical  Orientation:  Full (Time, Place, and Person)  Thought Content:  Negative  Suicidal Thoughts:  No  Homicidal Thoughts:  No  Memory:  Immediate;   Good Recent;   Good Remote;   Good  Judgement:  Good, But poor with regards to susbtance use  Insight:  Fair  Psychomotor Activity:  Normal  Concentration:  Good  Recall:  Good   Fund of Knowledge:Good  Language: Good  Akathisia:  Negative  Handed:  Right  AIMS (if indicated):     Assets:  Communication Skills Desire for Improvement Housing Physical Health Social Support Transportation  ADL's:  Intact  Cognition: WNL  Sleep:  Number of Hours: 5   Treatment Plan Summary:  Major Depression, Mild, Single Episode Alcohol Use Disorder Cocaine Use Disorder Opioid Use Disorder Cannabis Use Disorder Benzodiazepine Use Disorder  Mr. Kerner is a 30 year old male with a history of depression and substance abuse admitted after most likely unintentional overdose on substances.  1. Major Depression, Mild: The patient is exhibiting some symptoms of sadness and loneliness and does admit to crying spells prior to admission. He was started on Celexa 20 mg by mouth daily for now for depression. He may have higher levels of anxiety once he is clean and sober.    2. Alcohol Use Disorder, Cocaine Use Disorder, Opioid Use Disorder, Cannabis Use Disorder, Benzodiazepine Use Disorder He is positive for benzodiazepines, cocaine, cannabis, and opioids.  the patient was placed on Librium 25 mg by mouth 3 times a day for alcohol withdrawal symptoms. He is also now on a Suboxone taper. Will plan to give Suboxone  po once today and  on Monday. The patient was advised to abstain from alcohol and all illicit drugs as they may worsen mood symptoms. Times spent encouraging the patient to enter a meaningful recovery program and he is interested in residential substance abuse treatment. He is open to residential substance abuse treatment  3. ID: HIV and hepatitis C were negative. The patient was educated about negative consequences of IV drug use and risk of diseases.  4. Smoking. Nicotine patch is available.   5: Disposition: The patient would benefit from residential substance abuse treatment. Will refer to [facility].    Daily contact with patient to assess and evaluate symptoms and  progress in treatment and Medication management  Levora Angel, MD 01/31/2016, 10:15 AM

## 2016-01-31 NOTE — Progress Notes (Signed)
D: Pt is pleasant and cooperative this evening. He is seen in the milieu interacting appropriately with staff and peers. Denies SI/HI/AVH at this time. Denies pain. Pt rates his depression as a 0 out of 10. He continues to verbalize concern about a "blood clot" he believes that he has in his leg. He asks that someone evaluate his leg to see if there is a clot or not. A: Emotional support and encouragement provided. Medications administered with education. q15 minute safety checks maintained. R: Pt remains free from harm. Will continue to monitor.

## 2016-01-31 NOTE — [facility] Group Notes (Signed)
[facility] LCSW Group Therapy  01/31/2016 4:07 PM  Type of Therapy:  Group Therapy  Participation Level:  Active  Participation Quality:  Monopolizing  Affect:  Appropriate  Cognitive:  Alert  Insight:  Limited  Engagement in Therapy:  Limited  Modes of Intervention:  Discussion, Education, Socialization and Support  Summary of Progress/Problems:Self esteem: Patients discussed self esteem and how it impacts them. They discussed what aspects in their lives has influenced their self esteem. They were challenged to identify changes that are needed in order to improve self esteem.  Pt monopolized group. He would interrupt and or speak while others were sharing. He would briefly stop when it was brought to his attention.   Sempra EnergyCandace L Rhondalyn Clingan MSW, LCSWA  01/31/2016, 4:07 PM

## 2016-01-31 NOTE — Plan of Care (Signed)
Problem: Ineffective individual coping Goal: STG-Increase in ability to manage activities of daily living Outcome: Progressing Patient is able to manage his activities of daily living

## 2016-01-31 NOTE — [facility] Group Notes (Signed)
[facility] Group Notes:  (Nursing/MHT/Case Management/Adjunct)  Date:  01/31/2016  Time:  3:03 AM  Type of Therapy:  Group Therapy  Participation Level:  Active  Participation Quality:  Appropriate  Affect:  Appropriate  Cognitive:  Appropriate  Insight:  Appropriate  Engagement in Group:  Engaged  Modes of Intervention:  n/a  Summary of Progress/Problems:  Edwin [name] 01/31/2016, 3:03 AM

## 2016-01-31 NOTE — Plan of Care (Signed)
Problem: Ineffective individual coping Goal: LTG: Patient will report a decrease in negative feelings Outcome: Not Progressing Patient reports not feeling good this am.  Patient states he is "ill."

## 2016-01-31 NOTE — Plan of Care (Signed)
Problem: Alteration in mood & ability to function due to Goal: STG: Patient verbalizes decreases in signs of withdrawal Outcome: Progressing Pt denies withdrawal symptoms at this time. Goal: STG-Patient will comply with prescribed medication regimen (Patient will comply with prescribed medication regimen)  Outcome: Progressing Pt taking medications as prescribed.

## 2016-01-31 NOTE — [facility] Group Notes (Signed)
[facility] Group Notes:  (Nursing/MHT/Case Management/Adjunct)  Date:  01/31/2016  Time:  5:53 PM  Type of Therapy:  Psychoeducational Skills  Participation Level:  Active  Participation Quality:  Appropriate, Attentive and Sharing  Affect:  Appropriate  Cognitive:  Alert and Appropriate  Insight:  Appropriate  Engagement in Group:  Engaged  Modes of Intervention:  Discussion, Education and Support  Summary of Progress/Problems:  Lynelle SmokeCara Travis [facility] Center[facility]oni 01/31/2016, 5:53 PM

## 2016-01-31 NOTE — Progress Notes (Signed)
Patient asked if he could use the phone as he had been waiting for others to get done with their calls.  Staff told him that phone time was over.  Patient became very angry and yelled at staff member.  Patient was able to be verbally de-escalated and calmed down in his room.  Patient apologized afterward for his behavior and went to group.

## 2016-01-31 NOTE — Progress Notes (Signed)
D:  Patient is alert and oriented on the unit this shift.  Patient attended and actively participated in groups today.  Patient denies suicidal ideation, homicidal ideation, auditory or visual hallucinations at the present time. Patient complained of feeling badly this am, with generalized pain and stomach pain.  MD was notified, subutex was given and patient reported feeling much better.  A:  Scheduled medications are administered to patient as per MD orders.  Emotional support and encouragement are provided.  Patient is maintained on q.15 minute safety checks.  Patient is informed to notify staff with questions or concerns. R:  No adverse medication reactions are noted.  Patient is cooperative with medication administration and treatment plan today.  Patient is receptive, calm and cooperative on the unit at this time.  Patient interacts well with others on the unit this shift.  Patient contracts for safety at this time.  Patient remains safe at this time.

## 2016-02-01 MED ORDER — ONDANSETRON HCL 4 MG PO TABS
4.0000 mg | ORAL_TABLET | Freq: Four times a day (QID) | ORAL | Status: DC | PRN
  Administered 2016-02-02 (×2): 4 mg via ORAL
  Filled 2016-02-01 (×2): qty 1

## 2016-02-01 MED ORDER — BUPRENORPHINE HCL 2 MG SL SUBL
4.0000 mg | SUBLINGUAL_TABLET | Freq: Once | SUBLINGUAL | Status: AC
Start: 2016-02-01 — End: 2016-02-01
  Administered 2016-02-01: 4 mg via SUBLINGUAL
  Filled 2016-02-01: qty 2

## 2016-02-01 NOTE — Progress Notes (Signed)
D: Pt continues to complain of generalized pain and nausea this evening. He is provided with gatorade and PRN pain medication upon request. He denies SI/HI/AVH at this time. Pt discusses with Clinical research associatewriter his plans for after discharge. He verbalizes a desire to stay sober for his children, stating "they look up to me and I want to be something they're proud of." Pt identifies a major stressor as his relationship with his family, stating that they distance themselves from him "because all heroin users eventually die." A: Emotional support and encouragement provided. Medications administered with education. Pt educated on the importance of a supportive environment and positive coping skills. q15 minute safety checks maintained. R: Pt remains free from harm. Will continue to monitor.

## 2016-02-01 NOTE — Progress Notes (Signed)
[facility] MD Progress Note  02/01/2016 5:31 PM Edwin [name]  MRN:  [id number]  Subjective:  Edwin [name] was very upset this morning. Our staff felt that the patient was smoking in his shower. We did research and did not find a lighter or cigarettes. He feels that he was accused falsely. Initially he wanted to be discharged immediately then he decided that he would like to take advantage of residential treatment. He was referred to [facility]. He is still on Suboxone taper complaining of symptoms of opiate withdrawal.  Principal Problem: Major depressive disorder, recurrent severe without psychotic features (HCC) Diagnosis:   Patient Active Problem List   Diagnosis Date Noted  . Tobacco use disorder [F17.200] 01/28/2016  . 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 Opioid dependence with intoxication delirium (HCC) [F11.221]   . Opiate or related narcotic overdose [T40.601A] 11/28/2015  . Substance abuse [F19.10]    Total Time spent with patient: 20 minutes  Past Psychiatric History: Substance abuse.  Past Medical History: History reviewed. No pertinent past medical history.  Past Surgical History  Procedure Laterality Date  . Dental surgery     Family History: History reviewed. No pertinent family history. Family Psychiatric  History: See H&P. Social History:  History  Alcohol Use  . Yes     History  Drug Use No    Comment: states has stopped using    Social History   Social History  . Marital Status: Single    Spouse Name: N/A  . Number of Children: N/A  . Years of Education: N/A   Social History Main Topics  . Smoking status: Current Every Day Smoker  . Smokeless tobacco: None  . Alcohol Use: Yes  . Drug Use: No     Comment: states has stopped using  . Sexual Activity: Not Asked   Other Topics Concern  . None   Social History Narrative   Additional Social History:                         Sleep:  Fair  Appetite:  Fair  Current Medications: Current Facility-Administered Medications  Medication Dose Route Frequency Provider Last Rate Last Dose  . acetaminophen (TYLENOL) tablet 650 mg  650 mg Oral Q6H PRN Brandy Hale, MD   650 mg at 02/01/16 0934  . alum & mag hydroxide-simeth (MAALOX/MYLANTA) 200-200-20 MG/5ML suspension 30 mL  30 mL Oral Q4H PRN Brandy Hale, MD   30 mL at 01/30/16 2204  . chlordiazePOXIDE (LIBRIUM) capsule 25 mg  25 mg Oral TID Brandy Hale, MD   25 mg at 02/01/16 1518  . citalopram (CELEXA) tablet 20 mg  20 mg Oral Daily Darliss Ridgel, MD   20 mg at 02/01/16 0933  . magnesium hydroxide (MILK OF MAGNESIA) suspension 30 mL  30 mL Oral Daily PRN Brandy Hale, MD      . multivitamin-lutein (OCUVITE-LUTEIN) capsule 1 capsule  1 capsule Oral Daily Cy Blamer, RPH   1 capsule at 02/01/16 0932  . nicotine (NICODERM CQ - dosed in mg/24 hours) patch 21 mg  21 mg Transdermal Daily Oluwasemilore Bahl B Luccas Towell, MD   21 mg at 02/01/16 0930  . thiamine (VITAMIN B-1) tablet 100 mg  100 mg Oral Daily Darliss Ridgel, MD   100 mg at 02/01/16 0933  . traZODone (DESYREL) tablet 150 mg  150 mg Oral QHS PRN Darliss Ridgel, MD   150 mg  at 01/31/16 2128    Lab Results: No results found for this or any previous visit (from the past 48 hour(s)).  Blood Alcohol level:  Lab Results  Component Value Date   ETH <5 01/27/2016   ETH <5 12/24/2015    Physical Findings: AIMS: Facial and Oral Movements Muscles of Facial Expression: None, normal Lips and Perioral Area: None, normal Jaw: None, normal Tongue: None, normal,Extremity Movements Upper (arms, wrists, hands, fingers): None, normal Lower (legs, knees, ankles, toes): None, normal, Trunk Movements Neck, shoulders, hips: None, normal, Overall Severity Severity of abnormal movements (highest score from questions above): None, normal Incapacitation due to abnormal movements: None, normal Patient's awareness of abnormal movements (rate only  patient's report): Aware, no distress, Dental Status Current problems with teeth and/or dentures?: No Does patient usually wear dentures?: No  CIWA:    COWS:     Musculoskeletal: Strength & Muscle Tone: within normal limits Gait & Station: normal Patient leans: N/A  Psychiatric Specialty Exam: Review of Systems  Psychiatric/Behavioral: Positive for substance abuse.  All other systems reviewed and are negative.   Blood pressure 119/73, pulse 65, temperature 97.7 F (36.5 C), temperature source Oral, resp. rate 18, height 5\' 8"  (1.727 m), weight 89.359 kg (197 lb), SpO2 97 %.Body mass index is 29.96 kg/(m^2).  General Appearance: Casual  Eye Contact::  Good  Speech:  Clear and Coherent  Volume:  Normal  Mood:  Dysphoric  Affect:  Inappropriate and Labile  Thought Process:  Goal Directed  Orientation:  Full (Time, Place, and Person)  Thought Content:  WDL  Suicidal Thoughts:  No  Homicidal Thoughts:  No  Memory:  Immediate;   Fair Recent;   Fair Remote;   Fair  Judgement:  Impaired  Insight:  Lacking  Psychomotor Activity:  Increased  Concentration:  Fair  Recall:  FiservFair  Fund of Knowledge:Fair  Language: Fair  Akathisia:  No  Handed:  Right  AIMS (if indicated):     Assets:  Communication Skills Desire for Improvement Financial Resources/Insurance Physical Health Resilience Social Support  ADL's:  Intact  Cognition: WNL  Sleep:  Number of Hours: 7   Treatment Plan Summary: Daily contact with patient to assess and evaluate symptoms and progress in treatment and Medication management   Edwin [name] is a 30 year old male with a history of depression and substance abuse admitted after most likely unintentional overdose on substances.  1. Suicidal ideation. The patient is able to contract for safety in the hospital.  2. Substance abuse. He is positive for benzodiazepines, cocaine, cannabis, and opioids. He is on Suboxone taper.  3. Alcohol abuse. The patient uses  alcohol when he has no access to his other drugs. He also uses benzodiazepines. He is on Librium taper.  4. Mood. The patient endorses symptoms of depression but is uncertain about pharmacotherapy.  5. Substance abuse treatment. He desires to residential treatment. [facility] referral completed.   6. ID. The patient has been using IV heroine. RPR, HIV and Hepatitis negative.    7. Smoking. Nicotine patch is available.   8. Disposition. TBE.  Kristine LineaJolanta Mozell Haber, MD 02/01/2016, 5:31 PM

## 2016-02-01 NOTE — Progress Notes (Signed)
D: Staff smelled cigarette smoke in his room around 1100 am today, at first patient denied Dr. Demetrius CharityP and Arlington Calixoni Bartlett were notified, room search and skin search done, no contraband found, later in the afternoon patient apologized and explained that he used a staple and the power outlet to light 1 cigarette that he had snuck in during his admission, pt is going to groups and has been otherwise pleasant today  A:  Emotional support provided, Encouraged pt to continue with treatment plan and attend all group activities, q15 min checks maintained for safety.  R:  Pt is receptive, going to groups, remains safe, states that he is motivated to stay clean and wants treatment/rehab after discharge.

## 2016-02-01 NOTE — Plan of Care (Signed)
Problem: Alteration in mood Goal: STG-Patient is able to discuss feelings and issues (Patient is able to discuss feelings and issues leading to depression)  Outcome: Progressing Pt discusses problems with substance abuse at it relates to his family. He also reports a desire to stay sober so that he can reconnect with his children.  Problem: Alteration in mood & ability to function due to Goal: STG-Patient will report withdrawal symptoms Outcome: Progressing Pt reports withdrawal symptoms to staff. Describes these symptoms as "nausea and aching all over."

## 2016-02-01 NOTE — Progress Notes (Signed)
Recreation Therapy Notes  Date: 04.03.17 Time: 1:15 pm Location: Craft Room  Group Topic: Wellness  Goal Area(s) Addresses:  Patient will identify at least one item per dimension of health. Patient will examine areas they are deficient in.  Behavioral Response: Attentive, Interactive  Intervention: 6 Dimensions of Health  Activity: Patients were given a definition sheet with the 6 dimensions on wellness on it. Patients were given a worksheet with the different dimensions on it and were instructed to list at least one item in each category that they are currently doing.  Education: LRT educated patients on how they can increase each dimension.  Education Outcome: Acknowledges education/In group clarification offered  Clinical Observations/Feedback: Patient completed activity by writing at least one item in each category. Patient contribute to group discussion by stating what what he can do to increase certain areas, how this activity is related to his admission, and how it is related to his d/c.  Jacquelynn CreeGreene,Halleigh Comes M, LRT/CTRS 02/01/2016 4:42 PM

## 2016-02-01 NOTE — Plan of Care (Signed)
Problem: [facility] Participation in Recreation Therapeutic Interventions Goal: STG-Patient will demonstrate improved self esteem by identif STG: Self-Esteem - Within 4 treatment sessions, patient will verbalize at least 5 positive affirmation statements in each of 2 treatment sessions to increase self-esteem post d/c.  Outcome: Progressing Treatment Session 1; Completed 1 out of 2: At approximately 11:00 am, LRT met with patient in craft room. Patient verbalized 5 positive affirmation statements. Patient reported it felt "corny". LRT encouraged patient to continue saying positive affirmation statements. Intervention Used: I Am statements  Leonette Monarch, LRT/CTRS 04.03.17 3:24 pm Goal: STG-Patient will identify at least five coping skills for ** STG: Coping Skills - Within 4 treatment sessions, patient will verbalize at least 5 coping skills for substance abuse in each of 2 treatment sessions to decrease substance abuse post d/c.  Outcome: Progressing Treatment Session; Completed 1 out of 2: At approximately 11:00 am, LRT met with patient in craft room. Patient verbalized 5 coping skills for substance abuse. LRT educated patient on leisure and why it is important to implement it into his schedule. LRT educated and provided patient with blank schedules help him plan his day and try to avoid using substances. LRT educated patient on healthy support systems. Intervention Used: Coping Skills worksheet  Leonette Monarch, LRT/CTRS 04.03.17 3:29 pm

## 2016-02-02 MED ORDER — BUPRENORPHINE HCL 8 MG SL SUBL
8.0000 mg | SUBLINGUAL_TABLET | Freq: Every day | SUBLINGUAL | Status: AC
  Administered 2016-02-02 – 2016-02-03 (×2): 8 mg via SUBLINGUAL
  Filled 2016-02-02 (×2): qty 1
  Filled 2016-02-02: qty 4

## 2016-02-02 NOTE — Progress Notes (Signed)
Recreation Therapy Notes  Date: 04.04.17 Time: 3:10 pm Location: Craft Room  Group Topic: Goal Setting  Goal Area(s) Addresses:  Patient will be able to identify one supportive statement per peer in group. Patient will verbalize benefit of setting goals. Patient will verbalize benefit of supporting peers. Patient will identify benefit of feeling supported.  Behavioral Response: Attentive, Interactive  Intervention: Step By Step  Activity: Patients were given a foot worksheet and instructed to write a goal towards their recovery inside the foot. Patients then passed their papers around and wrote supportive statements on their peer's papers.  Education: LRT educated patients on healthy ways to celebrate achieving their goals.  Education Outcome: Acknowledges education/In group clarification offered   Clinical Observations/Feedback: Patient completed activity by writing a personal goal and writing positive statements on peer's papers. Patient contributed to group discussion by stating it was easy to think of a personal goal and why, what steps he is taking to reach his goal, how it felt to get positive reinforcement from peers, that he can use the positive reinforcement to help him reach his goal, that it isn't easy to find intrinsic motivation, and how setting goals can benefit his life post d/c.  Jacquelynn CreeGreene,Jasn Xia M, LRT/CTRS 02/02/2016 4:39 PM

## 2016-02-02 NOTE — Progress Notes (Addendum)
[facility]  02/02/2016 2:40 PM Edwin ChimesMichael P [name]  MRN:  [id number]  Subjective:  Edwin [name] still complains of symptoms of opiate withdrawal WITH muscle pain nausea and diarrhea. We will continue Subutex. He completed alcohol detox and is discontinued. He reports symptoms of depression but is no longer suicidal. He hopes to be accepted to [facility] rehabilitation program that that is not available today. He will be discharged tomorrow either to this program or to outpatient substance abuse treatment. Edwin [name] participate in treatment team today. He again expressed desire to go to rehabilitation. He is forward thinking and optimistic about the future. He is able to return to his grandmother's house and he feels the environment that is conducive to drug use.   Principal Problem: Major depressive disorder, recurrent severe without psychotic features (HCC) Diagnosis:   Patient Active Problem List   Diagnosis Date Noted  . Tobacco use disorder [F17.200] 01/28/2016  . 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 Opioid dependence with intoxication delirium (HCC) [F11.221]   . Opiate or related narcotic overdose [T40.601A] 11/28/2015  . Substance abuse [F19.10]    Total Time spent with patient: 20 minutes  Past Psychiatric History: Mood instability, substance abuse.  Past Medical History: History reviewed. No pertinent past medical history.  Past Surgical History  Procedure Laterality Date  . Dental surgery     Family History: History reviewed. No pertinent family history. Family Psychiatric  History: See H&P. Social History:  History  Alcohol Use  . Yes     History  Drug Use No    Comment: states has stopped using    Social History   Social History  . Marital Status: Single    Spouse Name: N/A  . Number of Children: N/A  . Years of Education: N/A   Social History Main Topics  . Smoking status: Current Every Day Smoker  .  Smokeless tobacco: None  . Alcohol Use: Yes  . Drug Use: No     Comment: states has stopped using  . Sexual Activity: Not Asked   Other Topics Concern  . None   Social History Narrative   Additional Social History:                         Sleep: Fair  Appetite:  Fair  Current Medications: Current Facility-Administered Medications  Medication Dose Route Frequency Provider Last Rate Last Dose  . acetaminophen (TYLENOL) tablet 650 mg  650 mg Oral Q6H PRN Brandy HaleUzma Faheem, MD   650 mg at 02/02/16 0914  . alum & mag hydroxide-simeth (MAALOX/MYLANTA) 200-200-20 MG/5ML suspension 30 mL  30 mL Oral Q4H PRN Brandy HaleUzma Faheem, MD   30 mL at 02/01/16 2046  . buprenorphine (SUBUTEX) sublingual tablet 8 mg  8 mg Sublingual Daily Denya Buckingham B Soniyah Mcglory, MD   8 mg at 02/02/16 1300  . citalopram (CELEXA) tablet 20 mg  20 mg Oral Daily Darliss RidgelAarti K Kapur, MD   20 mg at 02/02/16 0912  . magnesium hydroxide (MILK OF MAGNESIA) suspension 30 mL  30 mL Oral Daily PRN Brandy HaleUzma Faheem, MD      . multivitamin-lutein (OCUVITE-LUTEIN) capsule 1 capsule  1 capsule Oral Daily Cy Blamerllison K Tharakan, RPH   1 capsule at 02/02/16 0912  . nicotine (NICODERM CQ - dosed in mg/24 hours) patch 21 mg  21 mg Transdermal Daily Shari ProwsJolanta B Jakaiya Netherland, MD   21 mg at 02/02/16 0914  . ondansetron (  ZOFRAN) tablet 4 mg  4 mg Oral Q6H PRN Shari Prows, MD   4 mg at 02/02/16 0914  . thiamine (VITAMIN B-1) tablet 100 mg  100 mg Oral Daily Darliss Ridgel, MD   100 mg at 02/02/16 0912  . traZODone (DESYREL) tablet 150 mg  150 mg Oral QHS PRN Darliss Ridgel, MD   150 mg at 02/01/16 2227    Lab Results: No results found for this or any previous visit (from the past 48 hour(s)).  Blood Alcohol level:  Lab Results  Component Value Date   ETH <5 01/27/2016   ETH <5 12/24/2015    Physical Findings: AIMS: Facial and Oral Movements Muscles of Facial Expression: None, normal Lips and Perioral Area: None, normal Jaw: None, normal Tongue:  None, normal,Extremity Movements Upper (arms, wrists, hands, fingers): None, normal Lower (legs, knees, ankles, toes): None, normal, Trunk Movements Neck, shoulders, hips: None, normal, Overall Severity Severity of abnormal movements (highest score from questions above): None, normal Incapacitation due to abnormal movements: None, normal Patient's awareness of abnormal movements (rate only patient's report): Aware, no distress, Dental Status Current problems with teeth and/or dentures?: No Does patient usually wear dentures?: No  CIWA:    COWS:     Musculoskeletal: Strength & Muscle Tone: within normal limits Gait & Station: normal Patient leans: N/A  Psychiatric Specialty Exam: Review of Systems  Gastrointestinal: Positive for nausea and diarrhea.  Musculoskeletal: Positive for myalgias.  Psychiatric/Behavioral: Positive for depression and substance abuse.  All other systems reviewed and are negative.   Blood pressure 133/88, pulse 91, temperature 97.5 F (36.4 C), temperature source Oral, resp. rate 18, height  (1.727 m), weight 89.359 kg (197 lb), SpO2 97 %.Body mass index is 29.96 kg/(m^2).  General Appearance: Casual  Eye Contact::  Good  Speech:  Clear and Coherent  Volume:  Normal  Mood:  Anxious  Affect:  Appropriate  Thought Process:  Goal Directed  Orientation:  Full (Time, Place, and Person)  Thought Content:  WDL  Suicidal Thoughts:  No  Homicidal Thoughts:  No  Memory:  Immediate;   Fair Recent;   Fair Remote;   Fair  Judgement:  Impaired  Insight:  Shallow  Psychomotor Activity:  Normal  Concentration:  Fair  Recall:  [employer] of Knowledge:Fair  Language: Fair  Akathisia:  No  Handed:  Right  AIMS (if indicated):     Assets:  Communication Skills Desire for Improvement Housing Physical Health Resilience Social Support  ADL's:  Intact  Cognition: WNL  Sleep:  Number of Hours: 5   Treatment Plan Summary: Daily contact with patient to  assess and evaluate symptoms and progress in treatment and Medication management   Mr. Guidotti is a 30 year old male with a history of depression and substance abuse admitted after most likely unintentional overdose on substances.  1. Suicidal ideation. The patient is able to contract for safety in the hospital.  2. Substance abuse. He is positive for benzodiazepines, cocaine, cannabis, and opioids. He is on Suboxone taper.  3. Alcohol abuse. The patient uses alcohol when he has no access to his other drugs. He also uses benzodiazepines. He is on Librium taper.  4. Mood. The patient endorses symptoms of depression but is uncertain about pharmacotherapy.  5. Substance abuse treatment. He desires to residential treatment. [facility] referral completed.   6. ID. The patient has been using IV heroine. RPR, HIV and Hepatitis negative.   7. Smoking. Nicotine patch is  available.   8. Disposition. TBE.   Kristine Linea, MD 02/02/2016, 2:40 PM

## 2016-02-02 NOTE — Plan of Care (Signed)
Problem: Alteration in mood Goal: LTG-Patient reports reduction in suicidal thoughts (Patient reports reduction in suicidal thoughts and is able to verbalize a safety plan for whenever patient is feeling suicidal)  Outcome: Progressing Pt denies SI at this time.  Problem: Alteration in mood & ability to function due to Goal: STG-Patient will report withdrawal symptoms Outcome: Progressing Pt reports withdrawal symptoms including nausea.

## 2016-02-02 NOTE — Progress Notes (Signed)
D:  Patient is alert and oriented on the unit this shift.  Patient attended and actively participated in groups today.  Patient denies suicidal ideation, homicidal ideation, auditory or visual hallucinations at the present time.  Patient is complaining of nausea and pain at the current time.  Patient is requesting he be continued on subutex at this time.  A:  Scheduled medications are administered to patient as per MD orders.  Emotional support and encouragement are provided.  Patient is maintained on q.15 minute safety checks.  Patient is informed to notify staff with questions or concerns. R:  No adverse medication reactions are noted.  Patient is cooperative with medication administration and treatment plan today.  Patient is receptive, calm and cooperative on the unit at this time.  Patient interacts well with others on the unit this shift.  Patient contracts for safety at this time.  Patient remains safe at this time.

## 2016-02-02 NOTE — Plan of Care (Signed)
Problem: Ineffective individual coping Goal: STG: Patient will remain free from self harm Outcome: Progressing Patient remains free from self harm currently     

## 2016-02-02 NOTE — Progress Notes (Signed)
D: Pt is seen in the milieu interacting appropriately with staff and peers this evening. Upon approach he discusses with writer "all the ways I could light a cigarette here." Pt shows no remorse for smoking incident that occurred on prior shift. He continues to c/o nausea related to withdrawal. Pt also c/o left leg pain rated a 3 out of 10 which he would "like to be examined for a blood clot." He denies SI/HI/AVH at this time. A: Emotional support and encouragement provided. Pt reeducated on unit rules. Medications administered with education. q15 minute safety checks maintained. R: Pt remains free from harm. Will continue to monitor.

## 2016-02-02 NOTE — [facility] Group Notes (Signed)
[facility] LCSW Group Therapy  02/02/2016 5:19 PM  Type of Therapy:  Group Therapy  Participation Level:  Minimal  Participation Quality:  Attentive and Drowsy  Affect:  Blunted, Flat and Lethargic  Cognitive:  Appropriate  Insight:  Improving  Engagement in Therapy:  Limited  Modes of Intervention:  Activity, Discussion, Problem-solving, Socialization and Support  Summary: Pt paticipated in group activity Wheel of [employer] activity and participated in group processing around his feelings about diagnosis.  He identifies not liking his diagnosis, not wanting to be depressed or have a drug problem, but now that he knows, he wants to learn to accept it and get help.  Glennon MacLaws, Tanaja Ganger P, MSW, LCSW 02/02/2016, 5:19 PM

## 2016-02-02 NOTE — Plan of Care (Signed)
Problem: [facility] Participation in Recreation Therapeutic Interventions Goal: STG-Patient will demonstrate improved self esteem by identif STG: Self-Esteem - Within 4 treatment sessions, patient will verbalize at least 5 positive affirmation statements in each of 2 treatment sessions to increase self-esteem post d/c.  Outcome: Completed/Met Date Met:  02/02/16 Treatment Session 2; Complete 2 out of 2: At approximately 10:35 am, LRT met with patient in patient room. Patient verbalized 5 positive affirmation statements. Patient reported, "It got me out of bed this morning." LRT encouraged patient to continue saying positive affirmation statements. Intervention Used: I Am statements  Leonette Monarch, LRT/CTRS 04.04.17 1:15 pm Goal: STG-Patient will identify at least five coping skills for ** STG: Coping Skills - Within 4 treatment sessions, patient will verbalize at least 5 coping skills for substance abuse in each of 2 treatment sessions to decrease substance abuse post d/c.  Outcome: Completed/Met Date Met:  02/02/16 Treatment Session 2; Completed 2 out of 2: At approximately 10:35 am, LRT met with patient in patient room. Patient verbalized 5 coping skills for substance abuse. LRT encouraged patient to participate in leisure activities. Intervention Used: Coping Skills worksheet  Leonette Monarch, LRT/CTRS 04.04.17 1:17 pm

## 2016-02-02 NOTE — [facility] Group Notes (Signed)
[facility]  02/01/2016 5:18 PM  Type of Therapy:  Group Therapy  Participation Level:  Did Not Attend    Glennon MacLaws, Jennyfer Nickolson P, MSW, LCSW 02/02/2016, 5:18 PM

## 2016-02-02 NOTE — Plan of Care (Signed)
Problem: Alteration in mood Goal: STG-Patient is able to discuss feelings and issues (Patient is able to discuss feelings and issues leading to depression)  Outcome: Progressing Patient is able to discuss his feelings and his substance abuse issues with this Clinical research associatewriter currently

## 2016-02-03 MED ORDER — CITALOPRAM HYDROBROMIDE 20 MG PO TABS: 20.0000 mg | ORAL_TABLET | Freq: Every day | ORAL | Status: DC

## 2016-02-03 MED ORDER — NALOXONE HCL 2 MG/0.4ML IJ SOAJ: 2.0000 mg | INTRAMUSCULAR | Status: DC | PRN

## 2016-02-03 MED ORDER — TRAZODONE HCL 150 MG PO TABS
150.0000 mg | ORAL_TABLET | Freq: Every evening | ORAL | Status: DC | PRN
Start: 2016-02-03 — End: 2018-08-11

## 2016-02-03 MED ORDER — TRAZODONE HCL 150 MG PO TABS: 150.0000 mg | ORAL_TABLET | Freq: Every evening | ORAL | Status: DC | PRN

## 2016-02-03 NOTE — Tx Team (Signed)
Interdisciplinary Treatment Plan Update (Adult)  Date:  02/03/2016 Time Reviewed:  2:55 PM  Progress in Treatment: Attending groups: Yes. Participating in groups:  Yes. Taking medication as prescribed:  Yes. Tolerating medication:  Yes. Family/Significant othe contact made:  Yes, individual(s) contacted:  patient father Patient understands diagnosis:  Yes. Discussing patient identified problems/goals with staff:  Yes. Medical problems stabilized or resolved:  Yes. Denies suicidal/homicidal ideation: Yes. Issues/concerns per patient self-inventory:  Yes. Other:  New problem(s) identified: No, Describe:  none reported  Discharge Plan or Barriers: Patient will stabilize on meds and discharge home with grandma or rehab if available. Patient will follow up with outpatient Tx for substance abuse in [facility]  Reason for Continuation of Hospitalization: Depression Medication stabilization Suicidal ideation Withdrawal symptoms  Comments:  Estimated length of stay: 0 days, will discharge today Wednesday 02/03/16  New goal(s):  Review of initial/current patient goals per problem list:   1.  Goal(s): Participate in aftercare plan    Met:  Yes  Target date: by discharge  As evidenced by: patient will participate in aftercare plan AEB aftercare provider and housing plan identified at discharge 02/02/16: Patient may return home with grandmother but wants rehab. Patient can follow up with [facility] for Suboxone Tx. 02/03/16: Patient will discharge home with grandma and follow up at [facility] and will remain on [facility] waitlist but no beds this week are possible.    2.  Goal (s): Decrease depression   Met:  Yes  Target date: by discharge  As evidenced by: patient demonstrates decreased symptoms of depression and reports a Depression rating of 3 or less 02/02/16: Patient is improving but will continue to monitor depressive symptoms. 02/03/16: Patient stable for discharge per MD.    3.  Goal(s): Patient will demonstrate decreased signs of withdrawal due to substance abuse    Met:  Yes  Target date: by discharge  As evidenced by: Patient will produce a CIWA/COWS score of 0, have stable vitals signs, and no symptoms of withdrawal 02/02/16: Patient will continue to be monitored for symptoms of detox.  02/03/16: Patient stable for discharge per MD.     Attendees: Physician:  Orson Slick, MD 4/5/20172:55 PM  Nursing:   Carolynn Sayers, RN 4/5/20172:55 PM  Other:  Carmell Austria, LCSW 4/5/20172:55 PM  Other:   4/5/20172:55 PM  Other:  4/5/20172:55 PM  Other:  4/5/20172:55 PM  Other:  4/5/20172:55 PM  Other:  4/5/20172:55 PM  Other:  4/5/20172:55 PM  Other:  4/5/20172:55 PM  Other:   4/5/20172:55 PM   Scribe for Treatment Team:   Keene Breath, MSW, LCSW  02/03/2016, 2:55 PM

## 2016-02-03 NOTE — [facility] Suicide Risk Assessment (Signed)
[facility]   Principal Problem: Major depressive disorder, recurrent severe without psychotic features [facility](HCC) Discharge Diagnoses:  Patient Active Problem List   Diagnosis Date Noted  . Tobacco use disorder [F17.200] 01/28/2016  . 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 Opioid dependence with intoxication delirium (HCC) [F11.221]   . Opiate or related narcotic overdose [T40.601A] 11/28/2015  . Substance abuse [F19.10]     Total Time spent with patient: 30 minutes  Musculoskeletal: Strength & Muscle Tone: within normal limits Gait & Station: normal Patient leans: N/A  Psychiatric Specialty Exam: Review of Systems  All other systems reviewed and are negative.   Blood pressure 134/74, pulse 89, temperature 98 F (36.7 C), temperature source Oral, resp. rate 18, height 5\' 8"  (1.727 m), weight 89.359 kg (197 lb), SpO2 97 %.Body mass index is 29.96 kg/(m^2).  General Appearance: Casual  Eye Contact::  Good  Speech:  Clear and Coherent409  Volume:  Normal  Mood:  Anxious  Affect:  Appropriate  Thought Process:  Goal Directed  Orientation:  Full (Time, Place, and Person)  Thought Content:  WDL  Suicidal Thoughts:  No  Homicidal Thoughts:  No  Memory:  Immediate;   Fair Recent;   Fair Remote;   Fair  Judgement:  Impaired  Insight:  Shallow  Psychomotor Activity:  Normal  Concentration:  Fair  Recall:  [employer]  Fund of Knowledge:Fair  Language: Fair  Akathisia:  No  Handed:  Right  AIMS (if indicated):     Assets:  Communication Skills Desire for Improvement Housing Physical Health Resilience Social Support  Sleep:  Number of Hours: 5.75  Cognition: WNL  ADL's:  Intact   Mental Status Per Nursing Assessment::   On Admission:     Demographic Factors:  Male, Caucasian, Low socioeconomic status and Unemployed  Loss Factors: Decrease in vocational status, Loss of significant relationship,  Legal issues and Financial problems/change in socioeconomic status  Historical Factors: Prior suicide attempts, Family history of mental illness or substance abuse and Impulsivity  Risk Reduction Factors:   Responsible for children under 31 years of age, Sense of responsibility to family, Living with another person, especially a relative and Positive social support  Continued Clinical Symptoms:  Depression:   Comorbid alcohol abuse/dependence Impulsivity Alcohol/Substance Abuse/Dependencies  Cognitive Features That Contribute To Risk:  None    Suicide Risk:  Minimal: No identifiable suicidal ideation.  Patients presenting with no risk factors but with morbid ruminations; may be classified as minimal risk based on the severity of the depressive symptoms    Plan Of Care/Follow-up recommendations:  Activity:  As tolerated. Diet:  Low sodium heart healthy. Other:  Keep follow-up appointments.  Kristine LineaJolanta Makalynn Berwanger, MD 02/03/2016, 2:12 PM

## 2016-02-03 NOTE — [facility] Group Notes (Signed)
[facility] Group Notes:  (Nursing/MHT/Case Management/Adjunct)  Date:  02/03/2016  Time:  6:25 AM  Type of Therapy:  Group Therapy  Participation Level:  Active  Participation Quality:  Resistant  Affect:  Flat  Cognitive:  Alert  Insight:  Lacking  Engagement in Group:  Limited  Modes of Intervention:  n/a  Summary of Progress/Problems:  Edwin [name] 02/03/2016, 6:25 AM

## 2016-02-03 NOTE — Discharge Summary (Addendum)
Physician Discharge Summary Note  Patient:  Edwin [name] is an 30 y.o., male MRN:  [id number] DOB:  1986-03-10 Patient phone:  [phone] (home)  Patient address:   [address] [place] [address],  Total Time spent with patient: 30 minutes  Date of Admission:  01/28/2016 Date of Discharge: 02/03/2016  Reason for Admission:  Suicidal ideation, substance use.  Identifying data. Edwin [name] is a 30 year old male with history of depression, mood instability, suicide attempts, and substance abuse.  Chief complaint. "I have to do better."  History of present illness. Information was obtained from the patient and the chart. The patient has a long history of substance abuse with multiple overdoses. He is uncertain whether they are intentional or accidental. He had a serious overdose thousand 15 ended up in a hospital intubated with aspiration pneumonia and resulting pulmonary embolism. He stopped using drugs for a year and a half after that. Then he slowly resumed. His use escalated recently when he has learned that his ex-girlfriend and the mother of his children and is pregnant with another guy. He still loves her. He is very sad because his children were taken away from them due to substance use of the parents. The kids reside with his mother and he sees them frequently but has not been able to provide. He endorses many symptoms of depression with poor sleep, decreased appetite and 45 pound weight loss in the past year, anhedonia, feeling of guilt and hopelessness worthlessness, poor energy and concentration, crying spells, social isolation, escalating drug use, suicidal thinking. He denies psychotic symptoms. He denies symptoms suggestive of bipolar illness. He denies excessive anxiety. He uses also into drugs with the opiate preference. When he has no access to his opioids he drinks a bottle of vodka that last him through the night.  Past psychiatric history. He has never been hospitalized in a  psychiatric hospital. Never participated in substance abuse treatment. He had several overdoses most likely accidental. He has never taken any psychotropic medication.  Family psychiatric history. Father with alcoholism, mother with anxiety.  Social history. He dropped out of high school but that his GED's. He has 2 children that were removed from the house by DSS 5 years ago and lives with his mother. He is ex-wife is now in a new relationship and is pregnant. 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 The license was lost supposedly to unpaid parking tickets. He has some legal charges pending for stealing. He supports himself selling drugs, stealing, hustling. He was in jail for breaking someone's job in a Arch[employer]. All his charges are misdemeanor. There are no felony charges.  Principal Problem: Major depressive disorder, recurrent severe without psychotic features [facility]) Discharge Diagnoses: Patient Active Problem List   Diagnosis Date Noted  . Tobacco use disorder [F17.200] 01/28/2016  . 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 Opioid dependence with intoxication delirium (HCC) [F11.221]   . Opiate or related narcotic overdose [T40.601A] 11/28/2015  . Substance abuse [F19.10]     Past Psychiatric History: Depression, substance use.  Past Medical History: History reviewed. No pertinent past medical history.  Past Surgical History  Procedure Laterality Date  . Dental surgery     Family History: History reviewed. No pertinent family history. Family Psychiatric  History: Depression, alcoholism. Social History:  History  Alcohol Use  . Yes     History  Drug Use No  Comment: states has stopped using    Social History   Social History  . Marital Status: Single    Spouse Name: N/A  . Number of Children: N/A  . Years of Education: N/A   Social History Main Topics  . Smoking status: Current Every  Day Smoker  . Smokeless tobacco: None  . Alcohol Use: Yes  . Drug Use: No     Comment: states has stopped using  . Sexual Activity: Not Asked   Other Topics Concern  . None   Social History Narrative    Hospital Course:    Edwin [name] is a 30 year old male with a history of depression and substance abuse admitted after most likely unintentional overdose on substances.  1. Suicidal ideation. This has resolved. The patient is able to contract for safety. He is forward thinking and optimistic about the future.  2. Substance abuse. He was positive for benzodiazepines, cocaine, cannabis, and opioids. He completed Librium and Suboxone taper. Prescription for naloxone was given at discharge.  3. Substance abuse treatment. He desires to residential treatment. We referred him to [facility] facility but no beds are available.    4. Mood. The patient endorses symptoms of depression and was started on Celexa.   5. ID. The patient has been using IV heroine. RPR, HIV and Hepatitis are negative.   6. Smoking. Nicotine patch is available.   7. Disposition. He was discharged to his grandmother's house. He will call [facility] facility daily to check on bed status there.  Physical Findings: AIMS: Facial and Oral Movements Muscles of Facial Expression: None, normal Lips and Perioral Area: None, normal Jaw: None, normal Tongue: None, normal,Extremity Movements Upper (arms, wrists, hands, fingers): None, normal Lower (legs, knees, ankles, toes): None, normal, Trunk Movements Neck, shoulders, hips: None, normal, Overall Severity Severity of abnormal movements (highest score from questions above): None, normal Incapacitation due to abnormal movements: None, normal Patient's awareness of abnormal movements (rate only patient's report): Aware, no distress, Dental Status Current problems with teeth and/or dentures?: No Does patient usually wear dentures?: No  CIWA:    COWS:     Musculoskeletal: Strength  & Muscle Tone: within normal limits Gait & Station: normal Patient leans: N/A  Psychiatric Specialty Exam: Review of Systems  Psychiatric/Behavioral: Positive for substance abuse.  All other systems reviewed and are negative.   Blood pressure 134/74, pulse 89, temperature 98 F (36.7 C), temperature source Oral, resp. rate 18, height  (1.727 m), weight 89.359 kg (197 lb), SpO2 97 %.Body mass index is 29.96 kg/(m^2).  See SRA.                                                  Sleep:  Number of Hours: 5.75   Have you used any form of tobacco in the last 30 days? (Cigarettes, Smokeless Tobacco, Cigars, and/or Pipes): Yes  Has this patient used any form of tobacco in the last 30 days? (Cigarettes, Smokeless Tobacco, Cigars, and/or Pipes) Yes, Yes, A prescription for an FDA-approved tobacco cessation medication was offered at discharge and the patient refused  Blood Alcohol level:  Lab Results  Component Value Date   [facility] <5 01/27/2016   ETH <5 12/24/2015    Metabolic Disorder Labs:  Lab Results  Component Value Date   HGBA1C 5.8 01/21/2014   No results found for:  PROLACTIN Lab Results  Component Value Date   TRIG 124 02/11/2014    See Psychiatric Specialty Exam and Suicide Risk Assessment completed by Attending Physician prior to discharge.  Discharge destination:  Home  Is patient on multiple antipsychotic therapies at discharge:  No   Has Patient had three or more failed trials of antipsychotic monotherapy by history:  No  Recommended Plan for Multiple Antipsychotic Therapies: NA  Discharge Instructions    Diet - low sodium heart healthy    Complete by:  As directed      Increase activity slowly    Complete by:  As directed             Medication List    TAKE these medications      Indication   citalopram 20 MG tablet  Commonly known as:  CELEXA  Take 1 tablet (20 mg total) by mouth daily.   Indication:  Depression     Naloxone  HCl 2 MG/0.4ML Soaj  Commonly known as:  EVZIO  Inject 2 mg as directed as needed (for opiate overdose and unconsciousness).   Indication:  Opioid Overdose     traZODone 150 MG tablet  Commonly known as:  DESYREL  Take 1 tablet (150 mg total) by mouth at bedtime as needed for sleep.   Indication:  Trouble Sleeping         Follow-up recommendations:  Activity:  As tolerated. Diet:  Low sodium heart healthy. Other:  Keep follow-up appointments.  Comments:    Signed: Kristine LineaJolanta Amberlie Gaillard, MD 02/03/2016, 2:15 PM

## 2016-02-03 NOTE — Progress Notes (Signed)
Patient verbalizes understanding rt recommended discharge plan of care. Patient acknowledges all belongings have been returned. Patient to discharge with Dad private car transportation. Safety maintained.

## 2016-02-03 NOTE — [facility] Group Notes (Signed)
[facility] Group Notes:  (Nursing/MHT/Case Management/Adjunct)  Date:  02/03/2016  Time:  4:06 PM  Type of Therapy:  Psychoeducational Skills  Participation Level:  Active  Participation Quality:  Appropriate  Affect:  Appropriate  Cognitive:  Appropriate  Insight:  Appropriate  Engagement in Group:  Engaged  Modes of Intervention:  Discussion and Education  Summary of Progress/Problems:  Edwin [name] 02/03/2016, 4:06 PM

## 2016-02-03 NOTE — Progress Notes (Signed)
Recreation Therapy Notes  Date: 04.05.17 Time: 3:00 pm Location: Craft Room  Group Topic: Self-esteem  Goal Area(s) Addresses:  Patient will be able to identify benefit of self-esteem. Patient will be able to identify ways to increase self-esteem.  Behavioral Response: Attentive, Interactive  Intervention: Self-Portrait  Activity: Patients were given a blank face worksheet and instructed to draw their self-portrait of how they were feeling. Patients got a different worksheet and were instructed to write their first name and something positive about themselves. Patients passed their worksheets around and wrote positive traits about peers. Patient drew their self-portrait of how they felt after they read the positive comments from peers.  Education: LRT educated patients on ways to increase their self-esteem.  Education Outcome: Acknowledges education/In group clarification offered   Clinical Observations/Feedback: Patient completed activity by drawing both self-portraits, and writing positive trait about self and peers. Patient contributed to group discussion by stating how his faces were different, how he felt after he read positive traits from peers, that he did not find it difficult to believe the positive traits, and ways he can increase his self-esteem.  Jacquelynn CreeGreene,Lundon Rosier M, LRT/CTRS 02/03/2016 2:37 PM

## 2016-02-03 NOTE — [facility] Group Notes (Signed)
[facility] LCSW Group Therapy  02/03/2016 2:52 PM  Type of Therapy:  Group Therapy  Participation Level:  Minimal  Participation Quality:  Attentive  Affect:  Flat  Cognitive:  Alert  Insight:  Limited  Engagement in Therapy:  Limited  Modes of Intervention:  Discussion, Education, Socialization and Support  Summary of Progress/Problems: Emotional Regulation: Patients will identify both negative and positive emotions. They will discuss emotions they have difficulty regulating and how they impact their lives. Patients will be asked to identify healthy coping skills to combat unhealthy reactions to negative emotions.  Pt attended group and stayed the entire time. Pt sat quietly and listened to other group members share.    Tracie Dore L Comfort Iversen MSW, LCSWA  02/03/2016, 2:52 PM   

## 2016-02-03 NOTE — Progress Notes (Signed)
D: Observed pt interacting with peers on unit. Patient alert and oriented x4. Patient denies SI/HI/AVH. Pt affect appropriate. Pt appeared drowsy looking, but had no slurred speech nor imbalanced gait. Pt primary complaint was bilateral ankle pain and pain behind left knee. Pt was preoccupied with the possibility that he had a blood clot.Pt did acknowledge that has fear may be more psychological than medical in nature. Pt was joking appropriately with Clinical research associatewriter and peers. Pt c/o of nausea, but no other withdrawal symptoms. +3 pulses present in BLLE. Mild non-pitting edema around both ankles bilaterally, and tender to palpation. No tingling, weakness, or discoloration. No swelling behind the left knee.  A: Offered active listening and support. Provided therapeutic communication. Administered scheduled medications. Assessed vascularity of legs bilaterally. Educated pt on signs and symptoms of blood clot. Encouraged pt to elevate legs at night. R:  Pt indicated that he felt relieved after assessment. Pt expressed gratitude towards care that he has received here.Pt pleasant and cooperative. Pt medication compliant. Will continue Q15 min. checks. Safety maintained.

## 2016-02-03 NOTE — [facility] Group Notes (Signed)
[facility] LCSW Aftercare Discharge Planning Group Note   02/03/2016 11:18 AM  Participation Quality: Did not attend.   Deonne Rooks L Shaquanda Graves MSW, LCSWA    

## 2016-02-03 NOTE — [facility] Suicide Risk Assessment (Signed)
[facility] INPATIENT:  Family/Significant Other Suicide Prevention Education  Suicide Prevention Education:  Education Completed; Edwin HiresMichael [name] (father) [phone] has been identified by the patient as the family member/significant other with whom the patient will be residing, and identified as the person(s) who will aid the patient in the event of a mental health crisis (suicidal ideations/suicide attempt).  With written consent from the patient, the family member/significant other has been provided the following suicide prevention education, prior to the and/or following the discharge of the patient.  The suicide prevention education provided includes the following:  Suicide risk factors  Suicide prevention and interventions  National Suicide Hotline telephone number  [facility] assessment telephone number  [facility] Emergency Assistance 911  [facility] and/or Residential Mobile Crisis Unit telephone number  Request made of family/significant other to:  Remove weapons (e.g., guns, rifles, knives), all items previously/currently identified as safety concern.    Remove drugs/medications (over-the-counter, prescriptions, illicit drugs), all items previously/currently identified as a safety concern.  The family member/significant other verbalizes understanding of the suicide prevention education information provided.  The family member/significant other agrees to remove the items of safety concern listed above.  Edwin [name], Edwin [name], MSW, LCSW 02/03/2016, 2:45 PM

## 2016-02-03 NOTE — Progress Notes (Signed)
Patient with appropriate affect, cooperative behavior at this time. No SI/HI at this time. Denies s/s of withdrawal. Does take Tylenol prn po for c/o headache with good effect. Attends therapy groups to learn coping skills. Social with peers, verbalizes needs appropriately. Patient to discharge today. Safety maintained.

## 2016-02-03 NOTE — Tx Team (Signed)
Interdisciplinary Treatment Plan Update (Adult)  Date:  02/03/2016 Time Reviewed:  2:46 PM  Progress in Treatment: Attending groups: Yes. Participating in groups:  Yes. Taking medication as prescribed:  Yes. Tolerating medication:  Yes. Family/Significant othe contact made:  No, will contact:  will contact patient father Patient understands diagnosis:  Yes. Discussing patient identified problems/goals with staff:  Yes. Medical problems stabilized or resolved:  Yes. Denies suicidal/homicidal ideation: Yes. Issues/concerns per patient self-inventory:  Yes. Other:  New problem(s) identified: No, Describe:  none reported  Discharge Plan or Barriers: Patient will stabilize on meds and discharge home with grandma or rehab if available. Patient will follow up with outpatient Tx for substance abuse [facility]  Reason for Continuation of Hospitalization: Depression Medication stabilization Suicidal ideation Withdrawal symptoms  Comments:  Estimated length of stay: up to 1 day, expected discharge Wednesday 02/03/16  New goal(s):  Review of initial/current patient goals per problem list:   1.  Goal(s): Participate in aftercare plan    Met:  No  Target date: by discharge  As evidenced by: patient will participate in aftercare plan AEB aftercare provider and housing plan identified at discharge 02/02/16: Patient may return home with grandmother but wants rehab. Patient can follow up with [facility] for Suboxone Tx.   2.  Goal (s): Decrease depression   Met:  No  Target date: by discharge  As evidenced by: patient demonstrates decreased symptoms of depression and reports a Depression rating of 3 or less 02/02/16: Patient is improving but will continue to monitor depressive symptoms.  3.  Goal(s): Patient will demonstrate decreased signs of withdrawal due to substance abuse    Met:  No  Target date: by discharge  As evidenced by: Patient will produce a CIWA/COWS  score of 0, have stable vitals signs, and no symptoms of withdrawal 02/02/16: Patient will continue to be monitored for symptoms of detox.     Attendees: Patient:  Edwin [name] 4/5/20172:46 PM  Physician:  Orson Slick, MD 4/5/20172:46 PM  Nursing:   Nicanor Bake, RN 4/5/20172:46 PM  Other:  Garald Braver, Psych D. 4/5/20172:46 PM  Other:  Carmell Austria, LCSW 4/5/20172:46 PM  Other:   4/5/20172:46 PM  Other:  4/5/20172:46 PM  Other:  4/5/20172:46 PM  Other:  4/5/20172:46 PM  Other:  4/5/20172:46 PM  Other:  4/5/20172:46 PM  Other:  4/5/20172:46 PM  Other:   4/5/20172:46 PM   Scribe for Treatment Team:   Keene Breath, MSW, LCSW  02/03/2016, 2:46 PM

## 2016-02-03 NOTE — Progress Notes (Signed)
  [facility]  Will you be returning to the same living situation after discharge:  Yes,  home with grandmother At discharge, do you have transportation home?: Yes,  patient father will pick up Do you have the ability to pay for your medications: No. [facility] [phone]  Release of information consent forms completed and in the chart;  Patient's signature needed at discharge.  Patient to Follow up at: Follow-up Information    [facility].   Why:  For follow-up care appt for assessment on Thursday 02/04/16 at 9:00am (walkin appts M-F 9-4)   Contact information:   [address] [place], [place] [address] Ph [phone] Fax [phone]       Next level of care provider has access to [facility] Link:no  Safety Planning and Suicide Prevention discussed: Yes,  SPE discussed with patient and Larose HiresMichael Wassink (father) [phone]   Have you used any form of tobacco in the last 30 days? (Cigarettes, Smokeless Tobacco, Cigars, and/or Pipes): Yes  Has patient been referred to the Quitline?: Patient refused referral  Patient has been referred for addiction treatment: Yes  Lulu RidingIngle, Dejha King T, MSW, LCSW 02/03/2016, 2:58 PM [phone]

## 2016-02-03 NOTE — Plan of Care (Signed)
Problem: Alteration in mood & ability to function due to Goal: LTG-Patient demonstrates decreased signs of withdrawal (Patient demonstrates decreased signs of withdrawal to the point the patient is safe to return home and continue treatment in an outpatient setting)  Outcome: Progressing Pt only complaint was nausea this evening. Nausea resolved after medication given.

## 2016-02-29 ENCOUNTER — Ambulatory Visit: Payer: Self-pay

## 2017-07-29 ENCOUNTER — Emergency Department
Admission: EM | Admit: 2017-07-29 | Discharge: 2017-07-29 | Disposition: A | Discharge status: Home / Self Care | Payer: Self-pay | Attending: Emergency Medicine | Admitting: Emergency Medicine

## 2017-07-29 ENCOUNTER — Encounter: Payer: Self-pay | Admitting: Emergency Medicine

## 2017-07-29 DIAGNOSIS — Z79899 Other long term (current) drug therapy: Secondary | ICD-10-CM | POA: Insufficient documentation

## 2017-07-29 DIAGNOSIS — F1129 Opioid dependence with unspecified opioid-induced disorder: Secondary | ICD-10-CM | POA: Insufficient documentation

## 2017-07-29 DIAGNOSIS — T401X1A Poisoning by heroin, accidental (unintentional), initial encounter: Secondary | ICD-10-CM | POA: Insufficient documentation

## 2017-07-29 DIAGNOSIS — F17200 Nicotine dependence, unspecified, uncomplicated: Secondary | ICD-10-CM | POA: Insufficient documentation

## 2017-07-29 HISTORY — DX: Acute embolism and thrombosis of unspecified deep veins of unspecified lower extremity: I82.409

## 2017-07-29 HISTORY — DX: Unspecified viral hepatitis C without hepatic coma: B19.20

## 2017-07-29 HISTORY — DX: Poisoning by unspecified drugs, medicaments and biological substances, accidental (unintentional), initial encounter: T50.901A

## 2017-07-29 NOTE — ED Notes (Signed)
Dr. Darnelle Catalan at bedside talking to pt

## 2017-07-29 NOTE — ED Triage Notes (Signed)
Pt brought in by [facility] from home for drug overdose. Pt was found unresponsive and was cyanotic. Pt was given Narcan 1 mg and became responsive. Pt refused to let EMS start an IV. Pt has hx/o Hep C. Pt tearful.

## 2017-07-29 NOTE — ED Provider Notes (Signed)
[facility] Emergency Department Provider Note   ____________________________________________   First MD Initiated Contact with Patient 07/29/17 1543     (approximate)  I have reviewed the triage vital signs and the nursing notes.   HISTORY  Chief Complaint Drug Overdose    HPI Edwin [name] is a 31 y.o. male Patient reports he had used some heroin and some liquid Klonopin mixed with Valium that he got from [place] and overdose. EMS reportedly found him down and cyanotic gave him a milligram of Narcan and he woke up. Patient is now awake and alert walking and talking and acting normally wants to go will not stay I wrote him a referral for Dr. Sampson Goon as he was taking medicines for hep C but stopped using [facility]. Now he has insurance. He wants to resume this. He also has referral to [facility] which I gave him. He says he will try to work on staying sober he had been sober for 4 months previously.   Past Medical History:  Diagnosis Date  . Drug overdose   . DVT (deep venous thrombosis) (HCC)   . Hepatitis C     Patient Active Problem List   Diagnosis Date Noted  . Tobacco use disorder 01/28/2016  . 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 Opioid dependence with intoxication delirium (HCC)   . Opiate or related narcotic overdose 11/28/2015  . Substance abuse     Past Surgical History:  Procedure Laterality Date  . DENTAL SURGERY      Prior to Admission medications   Medication Sig Start Date End Date Taking? Authorizing Provider  citalopram (CELEXA) 20 MG tablet Take 1 tablet (20 mg total) by mouth daily. 02/03/16   Pucilowska, Jolanta B, MD  Naloxone HCl (EVZIO) 2 MG/0.4ML SOAJ Inject 2 mg as directed as needed (for opiate overdose and unconsciousness). 02/03/16   Pucilowska, Braulio Conte B, MD  traZODone (DESYREL) 150 MG tablet Take 1 tablet (150 mg total) by mouth at bedtime as needed for sleep. 02/03/16    Pucilowska, Ellin Goodie, MD    Allergies Vicodin [hydrocodone-acetaminophen]  No family history on file.  Social History Social History  Substance Use Topics  . Smoking status: Current Every Day Smoker  . Smokeless tobacco: Never Used  . Alcohol use Yes    Review of Systems  Constitutional: No fever/chills Eyes: No visual changes. ENT: No sore throat. Cardiovascular: Denies chest pain. Respiratory: Denies shortness of breath. Gastrointestinal: No abdominal pain.  No nausea, no vomiting.  No diarrhea.  No constipation. Genitourinary: Negative for dysuria. Musculoskeletal: Negative for back pain. Skin: Negative for rash. Neurological: Negative for headaches, focal weakness  ____________________________________________   PHYSICAL EXAM:  VITAL SIGNS: ED Triage Vitals [07/29/17 1542]  Enc Vitals Group     BP (!) 145/119     Pulse Rate (!) 102     Resp 16     Temp 98.3 F (36.8 C)     Temp Source Oral     SpO2 94 %     Weight      Height      Head Circumference      Peak Flow      Pain Score      Pain Loc      Pain Edu?      Excl. in GC?     Constitutional: Alert and oriented. Well appearing and in no acute distress. Eyes: Conjunctivae are normal.  Head: Atraumatic. Nose:  No congestion/rhinnorhea. Mouth/Throat: Mucous membranes are moist.  Oropharynx non-erythematous. Neck: No strido Cardiovascular: Normal rate, regular rhythm. Grossly normal heart sounds.  Good peripheral circulation. Respiratory: Normal respiratory effort.  No retractions. Lungs CTAB. Gastrointestinal: Soft and nontender. No distention. No abdominal bruits. No CVA tenderness. Musculoskeletal: No lower extremity tenderness nor edema.  No joint effusions. Neurologic:  Normal speech and language. No gross focal neurologic deficits are appreciated. No gait instability. Skin:  Skin is warm, dry and intact. No rash noted. Psychiatric: Mood and affect are normal. Speech and behavior are  normal.  ____________________________________________   LABS (all labs ordered are listed, but only abnormal results are displayed)  Labs Reviewed - No data to display ____________________________________________  EKG   ____________________________________________  RADIOLOGY  No results found.  ____________________________________________   PROCEDURES  Procedure(s) performed:   Procedures  Critical Care performed:  ____________________________________________   INITIAL IMPRESSION / ASSESSMENT AND PLAN / ED COURSE  Pertinent labs & imaging results that were available during my care of the patient were reviewed by me and considered in my medical decision making (see chart for details).        ____________________________________________   FINAL CLINICAL IMPRESSION(S) / ED DIAGNOSES  Final diagnoses:  Accidental overdose of heroin, initial encounter (HCC)      NEW MEDICATIONS STARTED DURING THIS VISIT:  New Prescriptions   No medications on file     Note:  This document was prepared using Dragon voice recognition software and may include unintentional dictation errors.    Arnaldo Natal, MD 07/29/17 [phone]

## 2017-07-29 NOTE — Discharge Instructions (Signed)
Follow-up with [facility] or you can try [facility]. Also follow-up with Dr. Sampson Goon for your hepatitis C. return for any further problems

## 2018-08-01 ENCOUNTER — Emergency Department: Payer: Self-pay

## 2018-08-01 ENCOUNTER — Emergency Department
Admission: EM | Admit: 2018-08-01 | Discharge: 2018-08-01 | Disposition: A | Discharge status: Home / Self Care | Payer: Self-pay | Attending: Emergency Medicine | Admitting: Emergency Medicine

## 2018-08-01 ENCOUNTER — Other Ambulatory Visit: Payer: Self-pay

## 2018-08-01 DIAGNOSIS — R0789 Other chest pain: Secondary | ICD-10-CM | POA: Insufficient documentation

## 2018-08-01 DIAGNOSIS — F1910 Other psychoactive substance abuse, uncomplicated: Secondary | ICD-10-CM | POA: Insufficient documentation

## 2018-08-01 DIAGNOSIS — Z79899 Other long term (current) drug therapy: Secondary | ICD-10-CM | POA: Insufficient documentation

## 2018-08-01 DIAGNOSIS — F17210 Nicotine dependence, cigarettes, uncomplicated: Secondary | ICD-10-CM | POA: Insufficient documentation

## 2018-08-01 LAB — CBC WITH DIFFERENTIAL/PLATELET
BASOS PCT: 1 %
Basophils Absolute: 0.1 10*3/uL (ref 0–0.1)
EOS PCT: 1 %
Eosinophils Absolute: 0.1 10*3/uL (ref 0–0.7)
HEMATOCRIT: 41.8 % (ref 40.0–52.0)
Hemoglobin: 15 g/dL (ref 13.0–18.0)
Lymphocytes Relative: 37 %
Lymphs Abs: 3.1 10*3/uL (ref 1.0–3.6)
MCH: 33.8 pg (ref 26.0–34.0)
MCHC: 35.8 g/dL (ref 32.0–36.0)
MCV: 94.5 fL (ref 80.0–100.0)
MONO ABS: 0.9 10*3/uL (ref 0.2–1.0)
MONOS PCT: 11 %
NEUTROS ABS: 4.2 10*3/uL (ref 1.4–6.5)
Neutrophils Relative %: 50 %
Platelets: 255 10*3/uL (ref 150–440)
RBC: 4.43 MIL/uL (ref 4.40–5.90)
RDW: 13.5 % (ref 11.5–14.5)
WBC: 8.3 10*3/uL (ref 3.8–10.6)

## 2018-08-01 LAB — COMPREHENSIVE METABOLIC PANEL
ALK PHOS: 56 U/L (ref 38–126)
ALT: 17 U/L (ref 0–44)
AST: 20 U/L (ref 15–41)
Albumin: 5 g/dL (ref 3.5–5.0)
Anion gap: 10 (ref 5–15)
BILIRUBIN TOTAL: 0.6 mg/dL (ref 0.3–1.2)
BUN: 18 mg/dL (ref 6–20)
CALCIUM: 9.6 mg/dL (ref 8.9–10.3)
CO2: 29 mmol/L (ref 22–32)
CREATININE: 1.05 mg/dL (ref 0.61–1.24)
Chloride: 103 mmol/L (ref 98–111)
GFR calc Af Amer: >60 mL/min (ref >60)
Glucose, Bld: 121 mg/dL — ABNORMAL HIGH (ref 70–99)
Potassium: 4.3 mmol/L (ref 3.5–5.1)
Sodium: 142 mmol/L (ref 135–145)
TOTAL PROTEIN: 8.4 g/dL — AB (ref 6.5–8.1)

## 2018-08-01 LAB — ACETAMINOPHEN LEVEL: Acetaminophen (Tylenol), Serum: <10 ug/mL — ABNORMAL LOW (ref 10–30)

## 2018-08-01 LAB — SALICYLATE LEVEL: Salicylate Lvl: <7 mg/dL (ref 2.8–30.0)

## 2018-08-01 LAB — ETHANOL: Alcohol, Ethyl (B): <10 mg/dL (ref <10)

## 2018-08-01 NOTE — ED Notes (Signed)

## 2018-08-01 NOTE — ED Notes (Signed)
First nurse note: Here via EMS with c/o overdose per family-Heroin and here for evaluation, no narcan administered, pt A&O upon EMS arrival. NAD.

## 2018-08-01 NOTE — ED Notes (Signed)
Pt observed ambulating towards the lobby - encouraged him to return to his bed for medical clearance by the MD  Pt states  "Nah - I'm gone"  Pt walked AMA

## 2018-08-01 NOTE — ED Triage Notes (Signed)
Pt states he did "some heroin and overdosed".  Injected the heroin about 40 minutes ago.  "Did IV meth and cocaine all night".  States he is having pain in his chest.

## 2018-08-01 NOTE — ED Provider Notes (Signed)
[facility] Emergency Department Provider Note  ____________________________________________  Time seen: Approximately 3:41 PM  I have reviewed the triage vital signs and the nursing notes.   HISTORY  Chief Complaint No chief complaint on file.    HPI Edwin [name] is a 32 y.o. male with a history of DVT hepatitis C and drug overdose who reports having a heroin overdose at about noon today.  Last night he was injecting cocaine and methamphetamine, and then this morning he wanted to "calm down and go to sleep" so he injected some heroin.  He did get sleepy and was found to be cyanotic and so EMS were called.  He did not receive Narcan on scene.  He reports that police gave him the option of going to jail or coming to the ED, so he is here in the ED.  He complains of chest pain for the past 2 days after lifting a friend up to pop his back.  Worse with movement of the left arm.  No shortness of breath, nonradiating, dull, mild intensity.    Past Medical History:  Diagnosis Date  . Drug overdose   . DVT (deep venous thrombosis) (HCC)   . Hepatitis C      Patient Active Problem List   Diagnosis Date Noted  . Tobacco use disorder 01/28/2016  . 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 Opioid dependence with intoxication delirium (HCC)   . Opiate or related narcotic overdose (HCC) 11/28/2015  . Substance abuse [facility])      Past Surgical History:  Procedure Laterality Date  . DENTAL SURGERY       Prior to Admission medications   Medication Sig Start Date End Date Taking? Authorizing Provider  citalopram (CELEXA) 20 MG tablet Take 1 tablet (20 mg total) by mouth daily. 02/03/16   Pucilowska, Jolanta B, MD  Naloxone HCl (EVZIO) 2 MG/0.4ML SOAJ Inject 2 mg as directed as needed (for opiate overdose and unconsciousness). 02/03/16   Pucilowska, Braulio Conte B, MD  traZODone (DESYREL) 150 MG tablet Take 1 tablet (150 mg total)  by mouth at bedtime as needed for sleep. 02/03/16   Pucilowska, Ellin Goodie, MD     Allergies Vicodin [hydrocodone-acetaminophen]   No family history on file.  Social History Social History   Tobacco Use  . Smoking status: Current Every Day Smoker    Packs/day: 1.00    Years: 20.00    Pack years: 20.00    Types: Cigarettes  . Smokeless tobacco: Never Used  Substance Use Topics  . Alcohol use: Yes    Comment: beer and liquor, binges  . Drug use: Yes    Frequency: 1.0 times per week    Types: Marijuana, IV, Cocaine, Methamphetamines    Comment: heroin    Review of Systems  Constitutional:   No fever or chills.  ENT:   No sore throat. No rhinorrhea. Cardiovascular:   No chest pain or syncope. Respiratory:   No dyspnea or cough. Gastrointestinal:   Negative for abdominal pain, vomiting and diarrhea.  Musculoskeletal:   Left chest wall pain as above All other systems reviewed and are negative except as documented above in ROS and HPI.  ____________________________________________   PHYSICAL EXAM:  VITAL SIGNS: ED Triage Vitals  Enc Vitals Group     BP 08/01/18 1256 123/79     Pulse --      Resp --      Temp 08/01/18 1256 97.6 F (36.4  C)     Temp Source 08/01/18 1256 Oral     SpO2 08/01/18 1256 100 %     Weight 08/01/18 1300 171 lb (77.6 kg)     Height 08/01/18 1300 5\' 8"  (1.727 m)     Head Circumference --      Peak Flow --      Pain Score 08/01/18 1300 10     Pain Loc --      Pain Edu? --      Excl. in GC? --     Vital signs reviewed, nursing assessments reviewed.   Constitutional:   Alert and oriented. Non-toxic appearance. Eyes:   Conjunctivae are normal. EOMI. PERRL. ENT      Head:   Normocephalic and atraumatic.      Nose:   No congestion/rhinnorhea.       Mouth/Throat:   MMM, no pharyngeal erythema. No peritonsillar mass.       Neck:   No meningismus. Full ROM. Hematological/Lymphatic/Immunilogical:   No cervical  lymphadenopathy. Cardiovascular:   RRR. Symmetric bilateral radial and DP pulses.  No murmurs. Cap refill less than 2 seconds. Respiratory:   Normal respiratory effort without tachypnea/retractions. Breath sounds are clear and equal bilaterally. No wheezes/rales/rhonchi. Gastrointestinal:   Soft and nontender. Non distended. There is no CVA tenderness.  No rebound, rigidity, or guarding.  Musculoskeletal:   Normal range of motion in all extremities. No joint effusions.  No lower extremity tenderness.  No edema.  Left chest wall tender to the touch reproducing symptoms.   Neurologic:   Normal speech and language.  Motor grossly intact. No acute focal neurologic deficits are appreciated.  Skin:    Skin is warm, dry and intact. No rash noted.  No petechiae, purpura, or bullae.  ____________________________________________    LABS (pertinent positives/negatives) (all labs ordered are listed, but only abnormal results are displayed) Labs Reviewed  COMPREHENSIVE METABOLIC PANEL - Abnormal; Notable for the following components:      Result Value   Glucose, Bld 121 (*)    Total Protein 8.4 (*)    All other components within normal limits  ACETAMINOPHEN LEVEL - Abnormal; Notable for the following components:   Acetaminophen (Tylenol), Serum <10 (*)    All other components within normal limits  SALICYLATE LEVEL  ETHANOL  CBC WITH DIFFERENTIAL/PLATELET  URINE DRUG SCREEN, QUALITATIVE ([facility] ONLY)  TROPONIN I   ____________________________________________   EKG  Interpreted by me Normal sinus rhythm rate of 83, normal axis and intervals.  Voltage criteria for LVH.  Normal ST segments and T waves.  ____________________________________________    RADIOLOGY  Dg Chest 2 View  Result Date: 08/01/2018 CLINICAL DATA:  32 year old who admits to cocaine and methamphetamine use last night and heroin use earlier today, presenting with acute onset of chest pain. Current approximate 20 pack-year  smoker. EXAM: CHEST - 2 VIEW COMPARISON:  12/24/2015, 03/24/2014 and earlier. FINDINGS: Cardiomediastinal silhouette unremarkable, unchanged. Mild hyperinflation. Lungs clear. Bronchovascular markings normal. Pulmonary vascularity normal. No visible pleural effusions. No pneumothorax. Visualized bony thorax intact. IMPRESSION: Mild hyperinflation which likely indicates COPD and/or asthma. No acute cardiopulmonary disease. Electronically Signed   By: Hulan Saas M.D.   On: 08/01/2018 13:51    ____________________________________________   PROCEDURES Procedures  ____________________________________________    CLINICAL IMPRESSION / ASSESSMENT AND PLAN / ED COURSE  Pertinent labs & imaging results that were available during my care of the patient were reviewed by me and considered in my medical decision making (see chart  for details).    Patient presents with self-reported polysubstance abuse.  He has some chest pain which is noncardiac, clearly musculoskeletal chest wall pain.  Unfortunately patient decided to elope from the ED prior to completion of lab results to be able to complete medical assessment and adequately counsel him.  On my assessment is clinically sober and has medical decision-making capacity.  He was not trying to harm himself and this was a drug abuse misadventure.  He left before troponin could be completed.  Clinical Course as of Aug 01 1540  Wed Aug 01, 2018  1423 Does not share needles, declines HIV testing.  Counseled on availability of local needle exchange program including no practices.   [PS]    Clinical Course User Index [PS] Sharman Cheek, MD     ____________________________________________   FINAL CLINICAL IMPRESSION(S) / ED DIAGNOSES    Final diagnoses:  Polysubstance abuse (HCC)  Chest wall pain     ED Discharge Orders    None      Portions of this note were generated with dragon dictation software. Dictation errors may occur  despite best attempts at proofreading.    Sharman Cheek, MD 08/01/18 [phone]

## 2018-08-10 ENCOUNTER — Encounter: Payer: Self-pay | Admitting: Emergency Medicine

## 2018-08-10 ENCOUNTER — Emergency Department
Admission: EM | Admit: 2018-08-10 | Discharge: 2018-08-11 | Disposition: A | Discharge status: Other Acute Inpatient Hospital | Payer: Self-pay | Attending: Emergency Medicine | Admitting: Emergency Medicine

## 2018-08-10 ENCOUNTER — Other Ambulatory Visit: Payer: Self-pay

## 2018-08-10 DIAGNOSIS — F17210 Nicotine dependence, cigarettes, uncomplicated: Secondary | ICD-10-CM | POA: Insufficient documentation

## 2018-08-10 DIAGNOSIS — F1910 Other psychoactive substance abuse, uncomplicated: Secondary | ICD-10-CM | POA: Insufficient documentation

## 2018-08-10 DIAGNOSIS — Z79899 Other long term (current) drug therapy: Secondary | ICD-10-CM | POA: Insufficient documentation

## 2018-08-10 LAB — URINE DRUG SCREEN, QUALITATIVE (ARMC ONLY)
Amphetamines, Ur Screen: POSITIVE — AB
BARBITURATES, UR SCREEN: NOT DETECTED
BENZODIAZEPINE, UR SCRN: NOT DETECTED
Cannabinoid 50 Ng, Ur ~~LOC~~: POSITIVE — AB
Cocaine Metabolite,Ur ~~LOC~~: NOT DETECTED
MDMA (Ecstasy)Ur Screen: NOT DETECTED
METHADONE SCREEN, URINE: NOT DETECTED
Opiate, Ur Screen: POSITIVE — AB
Phencyclidine (PCP) Ur S: NOT DETECTED
TRICYCLIC, UR SCREEN: NOT DETECTED

## 2018-08-10 LAB — COMPREHENSIVE METABOLIC PANEL
ALBUMIN: 5.1 g/dL — AB (ref 3.5–5.0)
ALT: 16 U/L (ref 0–44)
ANION GAP: 10 (ref 5–15)
AST: 20 U/L (ref 15–41)
Alkaline Phosphatase: 62 U/L (ref 38–126)
BILIRUBIN TOTAL: 0.8 mg/dL (ref 0.3–1.2)
BUN: 18 mg/dL (ref 6–20)
CO2: 28 mmol/L (ref 22–32)
Calcium: 10 mg/dL (ref 8.9–10.3)
Chloride: 103 mmol/L (ref 98–111)
Creatinine, Ser: 1.01 mg/dL (ref 0.61–1.24)
GFR calc Af Amer: >60 mL/min (ref >60)
GLUCOSE: 149 mg/dL — AB (ref 70–99)
POTASSIUM: 4.5 mmol/L (ref 3.5–5.1)
Sodium: 141 mmol/L (ref 135–145)
TOTAL PROTEIN: 8.6 g/dL — AB (ref 6.5–8.1)

## 2018-08-10 LAB — ETHANOL

## 2018-08-10 LAB — CBC
HCT: 43.8 % (ref 39.0–52.0)
Hemoglobin: 15.1 g/dL (ref 13.0–17.0)
MCH: 31.5 pg (ref 26.0–34.0)
MCHC: 34.5 g/dL (ref 30.0–36.0)
MCV: 91.4 fL (ref 80.0–100.0)
PLATELETS: 290 10*3/uL (ref 150–400)
RBC: 4.79 MIL/uL (ref 4.22–5.81)
RDW: 12.9 % (ref 11.5–15.5)
WBC: 8.1 10*3/uL (ref 4.0–10.5)
nRBC: 0 % (ref 0.0–0.2)

## 2018-08-10 LAB — ACETAMINOPHEN LEVEL

## 2018-08-10 LAB — SALICYLATE LEVEL: Salicylate Lvl: <7 mg/dL (ref 2.8–30.0)

## 2018-08-10 MED ORDER — NICOTINE 21 MG/24HR TD PT24
21.0000 mg | MEDICATED_PATCH | Freq: Once | TRANSDERMAL | Status: DC
  Administered 2018-08-10: 21 mg via TRANSDERMAL

## 2018-08-10 NOTE — ED Provider Notes (Signed)
[facility] Emergency Department Provider Note ____________________________________________   First MD Initiated Contact with Patient 08/10/18 1811     (approximate)  I have reviewed the triage vital signs and the nursing notes.   HISTORY  Chief Complaint No chief complaint on file.    HPI Edwin [name] is a 32 y.o. male with PMH as noted below who presents with heroin abuse.  The patient admits using heroin today.  He was found by police with agonal respirations and given a sternal rub but no medications.  He has been responsive since that time.  The patient reports cocaine and methamphetamine use as well as alcohol, but states that he just used heroin today.  He reports ongoing constant depression although states he was not specifically trying to hurt himself and denies SI at this time.  Past Medical History:  Diagnosis Date  . Drug overdose   . DVT (deep venous thrombosis) (HCC)   . Hepatitis C     Patient Active Problem List   Diagnosis Date Noted  . Tobacco use disorder 01/28/2016  . 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 Opioid dependence with intoxication delirium (HCC)   . Opiate or related narcotic overdose (HCC) 11/28/2015  . Substance abuse [facility])     Past Surgical History:  Procedure Laterality Date  . DENTAL SURGERY      Prior to Admission medications   Medication Sig Start Date End Date Taking? Authorizing Provider  citalopram (CELEXA) 20 MG tablet Take 1 tablet (20 mg total) by mouth daily. 02/03/16   Pucilowska, Jolanta B, MD  Naloxone HCl (EVZIO) 2 MG/0.4ML SOAJ Inject 2 mg as directed as needed (for opiate overdose and unconsciousness). 02/03/16   Pucilowska, Braulio Conte B, MD  traZODone (DESYREL) 150 MG tablet Take 1 tablet (150 mg total) by mouth at bedtime as needed for sleep. 02/03/16   Pucilowska, Ellin Goodie, MD    Allergies Vicodin [hydrocodone-acetaminophen]  No family history on  file.  Social History Social History   Tobacco Use  . Smoking status: Current Every Day Smoker    Packs/day: 1.00    Years: 20.00    Pack years: 20.00    Types: Cigarettes  . Smokeless tobacco: Never Used  Substance Use Topics  . Alcohol use: Yes    Comment: beer and liquor, binges  . Drug use: Yes    Frequency: 1.0 times per week    Types: Marijuana, IV, Cocaine, Methamphetamines    Comment: heroin    Review of Systems  Constitutional: No fever. Eyes: No redness. ENT: No sore throat. Cardiovascular: Denies chest pain. Respiratory: Denies shortness of breath. Gastrointestinal: No vomiting.  Genitourinary: Negative for flank pain.  Musculoskeletal: Negative for back pain. Skin: Negative for rash. Neurological: Negative for headache.   ____________________________________________   PHYSICAL EXAM:  VITAL SIGNS: ED Triage Vitals [08/10/18 1730]  Enc Vitals Group     BP (!) 137/93     Pulse Rate (!) 120     Resp 16     Temp 97.7 F (36.5 C)     Temp Source Oral     SpO2 100 %     Weight      Height      Head Circumference      Peak Flow      Pain Score 0     Pain Loc      Pain Edu?      Excl. in GC?  Constitutional: Alert and oriented.  Slightly anxious appearing but in no acute distress. Eyes: Conjunctivae are normal.  EOMI. Head: Atraumatic. Nose: No congestion/rhinnorhea. Mouth/Throat: Mucous membranes are moist.   Neck: Normal range of motion.  Cardiovascular: Good peripheral circulation. Respiratory: Normal respiratory effort.  Gastrointestinal: No distention.  Musculoskeletal: Extremities warm and well perfused.  Neurologic:  Normal speech and language. No gross focal neurologic deficits are appreciated.  Skin:  Skin is warm and dry. No rash noted. Psychiatric: Slightly anxious appearing, but cooperative.  Speech and behavior are normal.  ____________________________________________   LABS (all labs ordered are listed, but only abnormal  results are displayed)  Labs Reviewed  COMPREHENSIVE METABOLIC PANEL - Abnormal; Notable for the following components:      Result Value   Glucose, Bld 149 (*)    Total Protein 8.6 (*)    Albumin 5.1 (*)    All other components within normal limits  ACETAMINOPHEN LEVEL - Abnormal; Notable for the following components:   Acetaminophen (Tylenol), Serum <10 (*)    All other components within normal limits  URINE DRUG SCREEN, QUALITATIVE ([facility] ONLY) - Abnormal; Notable for the following components:   Amphetamines, Ur Screen POSITIVE (*)    Opiate, Ur Screen POSITIVE (*)    Cannabinoid 50 Ng, Ur [place] POSITIVE (*)    All other components within normal limits  ETHANOL  SALICYLATE LEVEL  CBC  CBG MONITORING, ED   ____________________________________________  EKG  ED ECG REPORT I, Dionne Bucy, the attending physician, personally viewed and interpreted this ECG.  Date: 08/10/2018 EKG Time: 1739 Rate: 123 Rhythm: Sinus tachycardia QRS Axis: normal Intervals: normal ST/T Wave abnormalities: normal Narrative Interpretation: no evidence of acute ischemia  ____________________________________________  RADIOLOGY    ____________________________________________   PROCEDURES  Procedure(s) performed: No  Procedures  Critical Care performed: No ____________________________________________   INITIAL IMPRESSION / ASSESSMENT AND PLAN / ED COURSE  Pertinent labs & imaging results that were available during my care of the patient were reviewed by me and considered in my medical decision making (see chart for details).  32 year old male with history of polysubstance abuse presents after apparent heroin overdose today, when he was found unresponsive and with agonal respirations.  He did not require Narcan in the field.  He endorses ongoing depression but no specific SI at this time.  On exam he was initially tachycardic but his other vital signs are normal and the remainder of  the exam is unremarkable.  Given recurrent heroin overdoses, I am concerned for danger to self related to his substance abuse although the patient is not suicidal and states he was not trying to harm himself today.  The patient has been placed under involuntary commitment.  We will obtain [facility] consultation and reassess.  ----------------------------------------- 11:09 PM on 08/10/2018 -----------------------------------------  The patient has been evaluated by [facility], who recommends maintaining the IVC and admitting the patient to inpatient psychiatry.  He is medically cleared.  ____________________________________________   FINAL CLINICAL IMPRESSION(S) / ED DIAGNOSES  Final diagnoses:  Polysubstance abuse (HCC)      NEW MEDICATIONS STARTED DURING THIS VISIT:  New Prescriptions   No medications on file     Note:  This document was prepared using Dragon voice recognition software and may include unintentional dictation errors.    Dionne Bucy, MD 08/10/18 [phone]

## 2018-08-10 NOTE — ED Notes (Signed)
IVC, pending placement per SOC 

## 2018-08-10 NOTE — ED Notes (Signed)
Patient is speaking with [facility]  Dr. Fermin Schwab.

## 2018-08-10 NOTE — ED Notes (Signed)
Pt dressing out at this time  

## 2018-08-10 NOTE — ED Triage Notes (Signed)
PT to ED via [place] [employer], pt states he overdosed on heroin , states he took aprox 0.3g of heroin around 4:30. PT father took out IVC. PT denies SI with overdose. Per PD officer pt had agonal respirations and was given sternal rub and pt has been responsive since, no narcan was administered. PT talking and cooperative.

## 2018-08-10 NOTE — ED Notes (Signed)

## 2018-08-11 ENCOUNTER — Inpatient Hospital Stay
Admission: AD | Admit: 2018-08-11 | Discharge: 2018-08-13 | DRG: 885 | Disposition: A | Discharge status: Home / Self Care | Payer: No Typology Code available for payment source | Attending: Psychiatry | Admitting: Psychiatry

## 2018-08-11 ENCOUNTER — Other Ambulatory Visit: Payer: Self-pay

## 2018-08-11 DIAGNOSIS — F1120 Opioid dependence, uncomplicated: Secondary | ICD-10-CM

## 2018-08-11 DIAGNOSIS — F1210 Cannabis abuse, uncomplicated: Secondary | ICD-10-CM | POA: Diagnosis not present

## 2018-08-11 DIAGNOSIS — F1510 Other stimulant abuse, uncomplicated: Secondary | ICD-10-CM

## 2018-08-11 DIAGNOSIS — Z86718 Personal history of other venous thrombosis and embolism: Secondary | ICD-10-CM | POA: Diagnosis not present

## 2018-08-11 DIAGNOSIS — F1924 Other psychoactive substance dependence with psychoactive substance-induced mood disorder: Secondary | ICD-10-CM | POA: Diagnosis present

## 2018-08-11 DIAGNOSIS — F17210 Nicotine dependence, cigarettes, uncomplicated: Secondary | ICD-10-CM | POA: Diagnosis present

## 2018-08-11 DIAGNOSIS — F1220 Cannabis dependence, uncomplicated: Secondary | ICD-10-CM | POA: Diagnosis present

## 2018-08-11 DIAGNOSIS — F1411 Cocaine abuse, in remission: Secondary | ICD-10-CM | POA: Diagnosis present

## 2018-08-11 DIAGNOSIS — Z653 Problems related to other legal circumstances: Secondary | ICD-10-CM

## 2018-08-11 DIAGNOSIS — W458XXA Other foreign body or object entering through skin, initial encounter: Secondary | ICD-10-CM

## 2018-08-11 DIAGNOSIS — F332 Major depressive disorder, recurrent severe without psychotic features: Secondary | ICD-10-CM | POA: Diagnosis present

## 2018-08-11 DIAGNOSIS — F17200 Nicotine dependence, unspecified, uncomplicated: Secondary | ICD-10-CM | POA: Diagnosis present

## 2018-08-11 MED ORDER — ALUM & MAG HYDROXIDE-SIMETH 200-200-20 MG/5ML PO SUSP: 30.0000 mL | ORAL | Status: DC | PRN

## 2018-08-11 MED ORDER — NICOTINE 21 MG/24HR TD PT24
21.0000 mg | MEDICATED_PATCH | Freq: Once | TRANSDERMAL | Status: AC
  Administered 2018-08-11: 21 mg via TRANSDERMAL
  Filled 2018-08-11: qty 1

## 2018-08-11 MED ORDER — NICOTINE 21 MG/24HR TD PT24
MEDICATED_PATCH | TRANSDERMAL | Status: AC
  Filled 2018-08-11: qty 1

## 2018-08-11 MED ORDER — CITALOPRAM HYDROBROMIDE 20 MG PO TABS
20.0000 mg | ORAL_TABLET | Freq: Every day | ORAL | Status: DC
  Administered 2018-08-12 – 2018-08-13 (×2): 20 mg via ORAL
  Filled 2018-08-11 (×3): qty 1

## 2018-08-11 MED ORDER — LOPERAMIDE HCL 2 MG PO CAPS
2.0000 mg | ORAL_CAPSULE | Freq: Three times a day (TID) | ORAL | Status: DC | PRN
  Administered 2018-08-12: 2 mg via ORAL
  Filled 2018-08-11: qty 1

## 2018-08-11 MED ORDER — MAGNESIUM HYDROXIDE 400 MG/5ML PO SUSP: 30.0000 mL | Freq: Every day | ORAL | Status: DC | PRN

## 2018-08-11 MED ORDER — IBUPROFEN 600 MG PO TABS
600.0000 mg | ORAL_TABLET | Freq: Four times a day (QID) | ORAL | Status: DC | PRN
  Administered 2018-08-12: 600 mg via ORAL
  Filled 2018-08-11: qty 1

## 2018-08-11 MED ORDER — ACETAMINOPHEN 325 MG PO TABS: 650.0000 mg | ORAL_TABLET | Freq: Four times a day (QID) | ORAL | Status: DC | PRN

## 2018-08-11 MED ORDER — LORAZEPAM 0.5 MG PO TABS: 0.5000 mg | ORAL_TABLET | Freq: Four times a day (QID) | ORAL | Status: DC | PRN

## 2018-08-11 NOTE — ED Notes (Signed)
Patient discharge/readmit to go to [facility], Patient voices understanding, all belongings taken with patient, he left per w/c with nurse and police custody for safety.

## 2018-08-11 NOTE — ED Notes (Addendum)
Error in charting.

## 2018-08-11 NOTE — [facility] Suicide Risk Assessment (Signed)
[facility] Admission Suicide Risk Assessment   Nursing information obtained from:  Patient Demographic factors:  Male, Unemployed, Living alone Current Mental Status:  See H+P Loss Factors: Death of girlfriend Historical Factors: Noncompliance with treatment Risk Reduction Factors:  Clean from susbtances, compliance with treatment  Total Time spent with patient: 45 minutes Principal Problem: Major Depression  Diagnosis:   Patient Active Problem List   Diagnosis Date Noted  . Major depressive disorder, recurrent episode, severe (HCC) [F33.2] 08/11/2018  . Tobacco use disorder [F17.200] 01/28/2016  . 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 Opioid dependence with intoxication delirium (HCC) [F11.221]   . Opiate or related narcotic overdose (HCC) [T40.601A] 11/28/2015  . Substance abuse (HCC) [F19.10]    Subjective Data:    Edwin [name] is a 32 year old single Caucasian male with a history of polysubstance to dependence who was brought to the emergency room under involuntary commitment taken out by his father.  The patient was at home and took an unintentional overdose of heroin.  He had decreased respirations.  He was given a sternal rub and became conscious.  No naloxone was given.  The patient Narcan was given.  The patient does report severe depressive symptoms since the death of his girlfriend and mother of his 2 children in 2017.  While he does report depression, he denies that he was trying to hurt himself.  He admits to polysubstance use including recent methamphetamine use, heroin use and marijuana use. He was positive for all 3 substances.  The patient denies any frequent crying spells or anhedonia but does endorse feelings of hopelessness secondary to multiple stressors.  He is currently on probation but feels he will be violated for his probation secondary to driving with a revoked driver's license and also not paying the necessary findings.   He has had multiple charges in the past for simple assault and marijuana and has been incarcerated in the past as well.  He is resistant to going to residential substance abuse treatment because he fears being "locked away" somewhere.  He denies any current active or passive suicidal thoughts.  He denies any homicidal thoughts.  He denies any auditory or visual hallucinations.  No paranoid thoughts or delusions.  The patient denies any history of any symptoms consistent with bipolar mood including grandiose delusions, racing thoughts, decreased sleep with increased goal directed behavior, hyperreligious thoughts or hypersexual behavior.  He currently lives with his father and grandmother.  He has 2 children, ages 5 and 67 but lives with his biological mother in another city.  He has had a strange relationship with both parents.  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  The patient feels he is struggling with grief secondary to the loss of his children's mother and would prefer counseling over any medications.    Past psychiatric history: The patient denies any prior suicide attempts but was hospitalized at [facility] in 2017.  He has never been compliant with outpatient psychotropic medication management or therapy.  Family psychiatric history: The patient denies having family members with history of mental illness or substance use  Substance abuse history: The patient reports a history of binge drinking alcohol once every few months, daily marijuana use since age of 72, methamphetamine abuse once every 1 to 2 months for several years, history of cocaine abuse in the past and heroin use once every 1 to 2 months.  He denies abusing opioid  pain pills because he says they are too expensive.  He denies abusing Xanax or Klonopin.  He does smoke 1.5 pack of cigarettes per day since his teens  Social history: The patient says his parents separated when he is  approximately 32 years old.  He does not get along with either parent but does currently live with his dad and brother.  He denies any history of any severe physical or sexual abuse.  He got his GED and has worked as an Personnel officer in the past.  He has been unemployed since July.  He has never been married but was with the mother of his 2 children until Mar 30, 2016 when she died of a drug overdose.  He has 33 year old and 49 year old son who lives with his mother.  Legal history: Patient has had multiple charges in the past including simple assault, marijuana charges, theft, driving with a revoked driver's license.  He is currently on probation.   Continued Clinical Symptoms:  Alcohol Use Disorder Identification Test Final Score (AUDIT): 1 The "Alcohol Use Disorders Identification Test", Guidelines for Use in Primary Care, Second Edition.  World Science writer [facility]). Score between 0-7:  no or low risk or alcohol related problems. Score between 8-15:  moderate risk of alcohol related problems. Score between 16-19:  high risk of alcohol related problems. Score 20 or above:  warrants further diagnostic evaluation for alcohol dependence and treatment.   CLINICAL FACTORS:   Depression:   Hopelessness Alcohol/Substance Abuse/Dependencies   Musculoskeletal: Strength & Muscle Tone: within normal limits Gait & Station: normal Patient leans: N/A  Psychiatric Specialty Exam: Physical Exam: See H+P  ROS: See H+P  Blood pressure 125/71, pulse 80, temperature 97.9 F (36.6 C), temperature source Oral, resp. rate 17, height 5\' 8"  (1.727 m), weight 77.1 kg, SpO2 100 %.Body mass index is 25.85 kg/m.      MSE: See H+P                                                    COGNITIVE FEATURES THAT CONTRIBUTE TO RISK:  Polysusbtance use  SUICIDE RISK:   Minimal: No identifiable suicidal ideation.  Patients presenting with no risk factors but with morbid ruminations; may be  classified as minimal risk based on the severity of the depressive symptoms. He is at a high risk of accidental death due to substance use and noncompliance with treatment.Marland Kitchen He denies having any guns.  PLAN OF CARE:    Major depressive disorder, recurrent versus substance-induced mood disorder Opioid use disorder, severe Amphetamine use disorder, severe Alcohol use disorder, mild Cannabis use disorder, severe Cocaine use disorder in remission Severe: Legal charges, family conflict, loss of girlfriend  Major depressive disorder, recurrent versus substance-induced mood disorder: We will plan to start the patient on Celexa 20 mg p.o. daily for depression.  He denies any current suicidal thoughts.  Opioid use disorder, severe, Amphetamine use disorder, severe, Alcohol use disorder, mild, Cannabis use disorder, severe, Cocaine use disorder in remission: The patient was advised to abstain from all illicit drugs and alcohol as it may worsen mood symptoms.  The patient currently is on probation and has legal charges.  He has already had positive drug screens in the past.  It was recommended that he go to residential substance abuse treatment although he is resistant and fears going to any  kind of facility for treatment.  He would also be an ideal candidate for Suboxone  Tobacco use disorder: He will be offered a nicotine patch.  He was educated about negative consequences of tobacco and health  Disposition: The patient does have a stable living situation but was advised to go to [facility] for residential substance abuse treatment.  He will need psychotropic medication management follow-up appointment at the time of discharge.   I certify that inpatient services furnished can reasonably be expected to improve the patient's condition.   Darliss Ridgel, MD 08/11/2018, 5:39 PM

## 2018-08-11 NOTE — ED Notes (Signed)
Nurse talked with patient, he denies Si/hi or avh, Patient states that it was a accident with the drugs, and that he had no intention of harming himself, he states that he has plans to move to [place] to be with His cousin and her husband and hopes to get clean, he is calm and cooperative, nurse will continue to monitor, q 15 minute checks and camera surveillance in progress for safety.

## 2018-08-11 NOTE — Progress Notes (Signed)
Patient ID: Edwin [name], male   DOB: [birth year], 32 y.o.   MRN: [id number]  D: Received patient from [facility]. Patient skin assessment completed with Shatara RN, skin is intact, no contraband found. Patient is denies SI/HI/AVH. Patient reports having recent overdose. Patient was confused with altered mental state. Patient denies depression and anxiety. Patient is discharge focused.   A: Patient oriented to unit/room/call light. Patient was offered support and encouragement. Patient was encourage to attend groups, participate in unit activities and continue with plan of care. Q x 15 minute observation checks were completed for safety.   R: Patient has no complaints at this time. Patient is receptive to treatment and safety maintained on unit.

## 2018-08-11 NOTE — H&P (Addendum)
Psychiatric Admission Assessment Adult  Patient Identification: Edwin [name] MRN:  [id number] Date of Evaluation:  08/11/2018 Chief Complaint:  Major depressive disorder, recurrent severe without psychotic features Principal Diagnosis: <principal problem not specified> Diagnosis:   Patient Active Problem List   Diagnosis Date Noted  . Major depressive disorder, recurrent episode, severe ([place]) [F33.2] 08/11/2018  . Tobacco use disorder [F17.200] 01/28/2016  . Major depressive disorder, recurrent severe without psychotic features ([place]) [F33.2]   . Opioid dependence with intoxication delirium ([place]) [F11.221]   . Opiate or related narcotic overdose ([place]) [T40.601A] 11/28/2015  . Substance abuse ([place]) [F19.10]    History of Present Illness:  Edwin [name] is a 32 year old single Caucasian male with a history of polysubstance to dependence who was brought to the emergency room under involuntary commitment taken out by his father.  The patient was at home and took an unintentional overdose of heroin.  He had decreased respirations.  He was given a sternal rub and became conscious.  No naloxone was given.  The patient Narcan was given.  The patient does report severe depressive symptoms since the death of his girlfriend and mother of his 2 children in 2017.  While he does report depression, he denies that he was trying to hurt himself.  He admits to polysubstance use including recent methamphetamine use, heroin use and marijuana use. He was positive for all 3 substances.  The patient denies any frequent crying spells or anhedonia but does endorse feelings of hopelessness secondary to multiple stressors.  He is currently on probation but feels he will be violated for his probation secondary to driving with a revoked driver's license and also not paying the necessary findings.  He has had multiple charges in the past for simple assault and marijuana and has been incarcerated in the past as well.  He  is resistant to going to residential substance abuse treatment because he fears being "locked away" somewhere.  He denies any current active or passive suicidal thoughts.  He denies any homicidal thoughts.  He denies any auditory or visual hallucinations.  No paranoid thoughts or delusions.  The patient denies any history of any symptoms consistent with bipolar mood including grandiose delusions, racing thoughts, decreased sleep with increased goal directed behavior, hyperreligious thoughts or hypersexual behavior.He.  He appears to have limited insight into severity of substance use and its affect on his family including his children.  He currently lives with his father and grandmother.  He has 2 children, ages 72 and 70 but lives with his biological mother in another city.  He has had a strange relationship with both parents.  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  The patient feels he is struggling with grief secondary to the loss of his children's mother and would prefer counseling over any medications.     Past psychiatric history: The patient denies any prior suicide attempts but was hospitalized at [facility] in 2017.  He has never been compliant with outpatient psychotropic medication management or therapy.  Family psychiatric history: The patient denies having family members with history of mental illness or substance use  Substance abuse history: The patient reports a history of binge drinking alcohol once every few months, daily marijuana use since age of 64, methamphetamine abuse once every 1 to 2 months for several years, history of cocaine abuse in the past and heroin use once every 1 to 2 months.  He denies abusing opioid pain pills  because he says they are too expensive.  He denies abusing Xanax or Klonopin.  He does smoke 1.5 pack of cigarettes per day since his teens  Social history: The patient says his parents separated when he is  approximately 32 years old.  He does not get along with either parent but does currently live with his dad and brother.  He denies any history of any severe physical or sexual abuse.  He got his GED and has worked as an Clinical biochemist in the past.  He has been unemployed since July.  He has never been married but was with the mother of his 2 children until 01/05/16 when she died of a drug overdose.  He has 33 year old and 57 year old son who lives with his mother.  Legal history: Patient has had multiple charges in the past including simple assault, marijuana charges, theft, driving with a revoked driver's license.  He is currently on probation.  Associated Signs/Symptoms: Depression Symptoms:  depressed mood, (Hypo) Manic Symptoms: None Anxiety Symptoms:  Denies Psychotic Symptoms:  Denies PTSD Symptoms:Denies Total Time spent with patient: 45 minutes    Is the patient at risk to self? Yes.    Has the patient been a risk to self in the past 6 months? Yes.    Has the patient been a risk to self within the distant past? Yes.    Is the patient a risk to others? No.  Has the patient been a risk to others in the past 6 months? No.  Has the patient been a risk to others within the distant past? No.   Prior Inpatient Therapy:  Yes Prior Outpatient Therapy:  Yes, [facility]  Alcohol Screening: 1. How often do you have a drink containing alcohol?: Monthly or less 2. How many drinks containing alcohol do you have on a typical day when you are drinking?: 1 or 2 3. How often do you have six or more drinks on one occasion?: Never AUDIT-C Score: 1 4. How often during the last year have you found that you were not able to stop drinking once you had started?: Never 5. How often during the last year have you failed to do what was normally expected from you becasue of drinking?: Never 6. How often during the last year have you needed a first drink in the morning to get yourself going after a heavy drinking  session?: Never 7. How often during the last year have you had a feeling of guilt of remorse after drinking?: Never 8. How often during the last year have you been unable to remember what happened the night before because you had been drinking?: Never 9. Have you or someone else been injured as a result of your drinking?: No 10. Has a relative or friend or a doctor or another health worker been concerned about your drinking or suggested you cut down?: No Alcohol Use Disorder Identification Test Final Score (AUDIT): 1 Intervention/Follow-up: AUDIT Score <7 follow-up not indicated Substance Abuse History in the last 12 months:  Yes.   Consequences of Substance Abuse: Legal Consequences:  multiple charges Family Consequences:  loss of custody of both children Previous Psychotropic Medications: Yes  Psychological Evaluations: Yes  Past Medical History:  Past Medical History:  Diagnosis Date  . Drug overdose   . DVT (deep venous thrombosis) ([place])   . Hepatitis C     Past Surgical History:  Procedure Laterality Date  . DENTAL SURGERY     Family History: History reviewed. No pertinent  family history.  Tobacco Screening: Have you used any form of tobacco in the last 30 days? (Cigarettes, Smokeless Tobacco, Cigars, and/or Pipes): Yes Tobacco use, Select all that apply: 4 or less cigarettes per day Are you interested in Tobacco Cessation Medications?: Yes, will notify MD for an order Counseled patient on smoking cessation including recognizing danger situations, developing coping skills and basic information about quitting provided: Yes Social History:  Social History   Substance and Sexual Activity  Alcohol Use Yes   Comment: beer and liquor, binges     Social History   Substance and Sexual Activity  Drug Use Yes  . Frequency: 1.0 times per week  . Types: Marijuana, IV, Cocaine, Methamphetamines   Comment: heroin       Allergies:   Allergies  Allergen Reactions  . Vicodin  [Hydrocodone-Acetaminophen] Other (See Comments)    Reaction:  GI upset    Lab Results:  Results for orders placed or performed during the hospital encounter of 08/10/18 (from the past 48 hour(s))  Comprehensive metabolic panel     Status: Abnormal   Collection Time: 08/10/18  5:39 PM  Result Value Ref Range   Sodium 141 135 - 145 mmol/L   Potassium 4.5 3.5 - 5.1 mmol/L   Chloride 103 98 - 111 mmol/L   CO2 28 22 - 32 mmol/L   Glucose, Bld 149 (H) 70 - 99 mg/dL   BUN 18 6 - 20 mg/dL   Creatinine, Ser 1.01 0.61 - 1.24 mg/dL   Calcium 10.0 8.9 - 10.3 mg/dL   Total Protein 8.6 (H) 6.5 - 8.1 g/dL   Albumin 5.1 (H) 3.5 - 5.0 g/dL   AST 20 15 - 41 U/L   ALT 16 0 - 44 U/L   Alkaline Phosphatase 62 38 - 126 U/L   Total Bilirubin 0.8 0.3 - 1.2 mg/dL   GFR calc non Af Amer >60 >60 mL/min   GFR calc Af Amer >60 >60 mL/min    Comment: (NOTE) The eGFR has been calculated using the CKD EPI equation. This calculation has not been validated in all clinical situations. eGFR's persistently <60 mL/min signify possible Chronic Kidney Disease.    Anion gap 10 5 - 15    Comment: Performed at [facility], [address] [place], [place] [address]  Ethanol     Status: None   Collection Time: 08/10/18  5:39 PM  Result Value Ref Range   Alcohol, Ethyl (B) <10 <10 mg/dL    Comment: (NOTE) Lowest detectable limit for serum alcohol is 10 mg/dL. For medical purposes only. Performed at [facility], [address] [place], [place] [address]   Salicylate level     Status: None   Collection Time: 08/10/18  5:39 PM  Result Value Ref Range   Salicylate Lvl <5.3 2.8 - 30.0 mg/dL    Comment: Performed at [facility], [address] [place], [place] [address]  Acetaminophen level     Status: Abnormal   Collection Time: 08/10/18  5:39 PM  Result Value Ref Range   Acetaminophen (Tylenol), Serum <10 (L) 10 - 30 ug/mL    Comment: (NOTE) Therapeutic concentrations vary  significantly. A range of 10-30 ug/mL  may be an effective concentration for many patients. However, some  are best treated at concentrations outside of this range. Acetaminophen concentrations >150 ug/mL at 4 hours after ingestion  and >50 ug/mL at 12 hours after ingestion are often associated with  toxic reactions. Performed at  [facility], [address] [place], [place] [address]   cbc     Status: None   Collection Time: 08/10/18  5:39 PM  Result Value Ref Range   WBC 8.1 4.0 - 10.5 K/uL   RBC 4.79 4.22 - 5.81 MIL/uL   Hemoglobin 15.1 13.0 - 17.0 g/dL   HCT 43.8 39.0 - 52.0 %   MCV 91.4 80.0 - 100.0 fL   MCH 31.5 26.0 - 34.0 pg   MCHC 34.5 30.0 - 36.0 g/dL   RDW 12.9 11.5 - 15.5 %   Platelets 290 150 - 400 K/uL   nRBC 0.0 0.0 - 0.2 %    Comment: Performed at [facility], [address], [place], [place] [address]  Urine Drug Screen, Qualitative     Status: Abnormal   Collection Time: 08/10/18  9:24 PM  Result Value Ref Range   Tricyclic, Ur Screen NONE DETECTED NONE DETECTED   Amphetamines, Ur Screen POSITIVE (A) NONE DETECTED   MDMA (Ecstasy)Ur Screen NONE DETECTED NONE DETECTED   Cocaine Metabolite,Ur [place] NONE DETECTED NONE DETECTED   Opiate, Ur Screen POSITIVE (A) NONE DETECTED   Phencyclidine (PCP) Ur S NONE DETECTED NONE DETECTED   Cannabinoid 50 Ng, Ur [place] POSITIVE (A) NONE DETECTED   Barbiturates, Ur Screen NONE DETECTED NONE DETECTED   Benzodiazepine, Ur Scrn NONE DETECTED NONE DETECTED   Methadone Scn, Ur NONE DETECTED NONE DETECTED    Comment: (NOTE) Tricyclics + metabolites, urine    Cutoff 1000 ng/mL Amphetamines + metabolites, urine  Cutoff 1000 ng/mL MDMA (Ecstasy), urine              Cutoff 500 ng/mL Cocaine Metabolite, urine          Cutoff 300 ng/mL Opiate + metabolites, urine        Cutoff 300 ng/mL Phencyclidine (PCP), urine         Cutoff 25 ng/mL Cannabinoid, urine                 Cutoff 50 ng/mL Barbiturates + metabolites,  urine  Cutoff 200 ng/mL Benzodiazepine, urine              Cutoff 200 ng/mL Methadone, urine                   Cutoff 300 ng/mL The urine drug screen provides only a preliminary, unconfirmed analytical test result and should not be used for non-medical purposes. Clinical consideration and professional judgment should be applied to any positive drug screen result due to possible interfering substances. A more specific alternate chemical method must be used in order to obtain a confirmed analytical result. Gas chromatography / mass spectrometry (GC/MS) is the preferred confirmat ory method. Performed at [facility], [address] [place], [place] [address]     Blood Alcohol level:  Lab Results  Component Value Date   [facility] <10 08/10/2018   ETH <10 34/74/2595    Metabolic Disorder Labs:  Lab Results  Component Value Date   HGBA1C 5.8 01/21/2014   No results found for: PROLACTIN Lab Results  Component Value Date   TRIG 124 02/11/2014    Current Medications: Current Facility-Administered Medications  Medication Dose Route Frequency Provider Last Rate Last Dose  . acetaminophen (TYLENOL) tablet 650 mg  650 mg Oral Q6H PRN Chauncey Mann, MD      . alum & mag hydroxide-simeth (MAALOX/MYLANTA) 200-200-20 MG/5ML suspension 30 mL  30 mL Oral Q4H PRN Cephus Shelling  K, MD      . citalopram (CELEXA) tablet 20 mg  20 mg Oral Daily Chauncey Mann, MD      . ibuprofen (ADVIL,MOTRIN) tablet 600 mg  600 mg Oral Q6H PRN Chauncey Mann, MD      . loperamide (IMODIUM) capsule 2 mg  2 mg Oral Q8H PRN Chauncey Mann, MD      . LORazepam (ATIVAN) tablet 0.5 mg  0.5 mg Oral Q6H PRN Chauncey Mann, MD      . magnesium hydroxide (MILK OF MAGNESIA) suspension 30 mL  30 mL Oral Daily PRN Chauncey Mann, MD      . nicotine (NICODERM CQ - dosed in mg/24 hours) patch 21 mg  21 mg Transdermal Once Chauncey Mann, MD   21 mg at 08/11/18 1537   PTA Medications: Medications Prior to Admission   Medication Sig Dispense Refill Last Dose  . citalopram (CELEXA) 20 MG tablet Take 1 tablet (20 mg total) by mouth daily. (Patient not taking: Reported on 08/11/2018) 30 tablet 0 Not Taking at Unknown time    Musculoskeletal: Strength & Muscle Tone: within normal limits Gait & Station: normal Patient leans: N/A  Psychiatric Specialty Exam: Physical Exam  Nursing note and vitals reviewed. He refused physical exam by this writer as he was angry he had to stay in hospital  Review of Systems  Constitutional: Negative.   HENT: Negative.   Eyes: Negative.   Respiratory: Negative.   Cardiovascular: Negative.   Gastrointestinal: Negative.   Musculoskeletal: Negative.   Skin: Negative.   Neurological: Negative.   Endo/Heme/Allergies: Negative.     Blood pressure 125/71, pulse 80, temperature 97.9 F (36.6 C), temperature source Oral, resp. rate 17, height _0  (1.727 m), weight 77.1 kg, SpO2 100 %.Body mass index is 25.85 kg/m.  General Appearance: Casual  Eye Contact:  Good  Speech:  Clear and Coherent and Normal Rate  Volume:  Normal  Mood:  Depressed  Affect:  Irritable  Thought Process:  Coherent, Goal Directed and Linear  Orientation:  Full (Time, Place, and Person)  Thought Content:  Logical  Suicidal Thoughts:  No  Homicidal Thoughts:  No  Memory:  Immediate;   Good Recent;   Good Remote;   Good  Judgement:  Impaired  Insight:  Lacking  Psychomotor Activity:  Normal  Concentration:  Concentration: Good and Attention Span: Good  Recall:  Good  Fund of Knowledge:  Good  Language:  Good  Akathisia:  No  Handed:  Right  AIMS (if indicated):     Assets:  Armed forces logistics/support/administrative officer Housing Physical Health Vocational/Educational  ADL's:  Intact  Cognition:  WNL  Sleep:       Treatment Plan Summary:  Major depressive disorder, recurrent versus substance-induced mood disorder Opioid use disorder, severe Amphetamine use disorder, severe Alcohol use disorder,  mild Cannabis use disorder, severe Cocaine use disorder in remission Severe: Legal charges, family conflict, loss of girlfriend  Major depressive disorder, recurrent versus substance-induced mood disorder: We will plan to start the patient on Celexa 20 mg p.o. daily for depression.  He denies any current suicidal thoughts.  Opioid use disorder, severe, Amphetamine use disorder, severe, Alcohol use disorder, mild, Cannabis use disorder, severe, Cocaine use disorder in remission: The patient was advised to abstain from all illicit drugs and alcohol as it may worsen mood symptoms.  The patient currently is on probation and has legal charges.  He has already had positive drug screens in  the past.  It was recommended that he go to residential substance abuse treatment although he is resistant and fears going to any kind of facility for treatment.  He would also be an ideal candidate for Suboxone  Due to IVDU will check HIV. He is Hepatitis C positive but LFTs WNL  Tobacco use disorder: He will be offered a nicotine patch.  He was educated about negative consequences of tobacco and health  Disposition: The patient does have a stable living situation but was advised to go to [facility] for residential substance abuse treatment.  He will need psychotropic medication management follow-up appointment at the time of discharge.   Daily contact with patient to assess and evaluate symptoms and progress in treatment and Medication management  Observation Level/Precautions:  15 minute checks                 Physician Treatment Plan for Primary Diagnosis:  Major Depression  Long Term Goal(s): Improvement in symptoms so as ready for discharge  Short Term Goals: Ability to verbalize feelings will improve, Ability to disclose and discuss suicidal ideas, Ability to demonstrate self-control will improve and Ability to identify triggers associated with substance abuse/mental health issues will improve  Physician  Treatment Plan for Secondary Diagnosis: Active Problems:   Major depressive disorder, recurrent episode, severe ([place])  Long Term Goal(s): Improvement in symptoms so as ready for discharge  Short Term Goals: Ability to identify changes in lifestyle to reduce recurrence of condition will improve, Ability to verbalize feelings will improve, Ability to disclose and discuss suicidal ideas, Ability to identify and develop effective coping behaviors will improve, Compliance with prescribed medications will improve and Ability to identify triggers associated with substance abuse/mental health issues will improve  I certify that inpatient services furnished can reasonably be expected to improve the patient's condition.    Chauncey Mann, MD 10/12/20195:31 PM

## 2018-08-11 NOTE — ED Notes (Signed)
Patient called 911, and told police that he was being held against his will and that he was in the xerox room, police did come by and nurse talked with her and matter was cleared up, Patient is angry and states that his sons are playing football today and He was supposed to go to game, he states " My dad and brother act like they care and they don't care", nurse encouraged Patient to go to room and He became calm and talked about how He was really upset after talking to dad on the phone because He feels like His family thinks of him as a joke, He states " I just need to leave to be with my boys, He is calm talking and no behavioral issues, nurse will continue to monitor.

## 2018-08-11 NOTE — ED Notes (Signed)
Nurse gave BMU nurse Lorenda Hatchet RN report, Patient will be transferring to room 316.

## 2018-08-11 NOTE — BH Assessment (Signed)
Patient seen by [facility] and recommended for inpatient treatment. Forward information to [facility] BMU rounding physician (Dr. Maryruth Bun) and she will put in admission orders.  Patient is to be admitted to [facility] by Dr. Maryruth Bun.  Attending Physician will be Dr. Jennet Maduro.   Patient has been assigned to room 316, by [facility] Charge Nurse Lorenda Hatchet.   ER staff is aware of the admission:  Ronnie, ER Secretary    Dr. Lenard Lance, ER MD   Toniann Fail, Patient's Nurse   Marylene Land, Patient Access.

## 2018-08-11 NOTE — Tx Team (Signed)
Initial Treatment Plan 08/11/2018 3:29 PM [name] Meir ZOX:[id number]    PATIENT STRESSORS: Financial difficulties Legal issue Substance abuse   PATIENT STRENGTHS: Ability for insight Average or above average intelligence Communication skills Supportive family/friends   PATIENT IDENTIFIED PROBLEMS: Substance Abuse  08/11/18  Homeless 08/11/18                   DISCHARGE CRITERIA:  Ability to meet basic life and health needs Improved stabilization in mood, thinking, and/or behavior  PRELIMINARY DISCHARGE PLAN: Outpatient therapy Return to previous living arrangement  PATIENT/FAMILY INVOLVEMENT: This treatment plan has been presented to and reviewed with the patient, Edwin [name], and/or family member,  The patient and family have been given the opportunity to ask questions and make suggestions.  Crist Infante, RN 08/11/2018, 3:29 PM

## 2018-08-11 NOTE — BH Assessment (Signed)
Assessment Note  Edwin [name] is an 32 y.o. male. Who presents after being involuntarily committed be a family member. Pt denied any formal or extensive mental health history. He admits to a long history of polysubstance abuse. Pt admits to use PTA. Pts speech is slurred and slow at this time. Pt states that when he spoke with the specialist on call he was still under the influence of drugs. Pt denied any history of suicide attempts. Pt admits to being found today following and unintentional overdose (heroin). Pt. denies any suicidal ideation, plan or intent. Pt. denies the presence of any auditory or visual hallucinations at this time. Patient denies any other medical complaints.A behavioral health assessment has been completed including evaluation of the patient, collecting collateral history:, reviewing available medical/clinic records, evaluating his unique risk and protective factors, and discussing treatment recommendations.     Diagnosis: Major Depression   Past Medical History:  Past Medical History:  Diagnosis Date  . Drug overdose   . DVT (deep venous thrombosis) (HCC)   . Hepatitis C     Past Surgical History:  Procedure Laterality Date  . DENTAL SURGERY      Family History: No family history on file.  Social History:  reports that he has been smoking cigarettes. He has a 20.00 pack-year smoking history. He has never used smokeless tobacco. He reports that he drinks alcohol. He reports that he has current or past drug history. Drugs: Marijuana, IV, Cocaine, and Methamphetamines. Frequency: 1.00 time per week.  Additional Social History:  Alcohol / Drug Use Pain Medications: SEE MAR Prescriptions: SEE MAR Over the Counter: DENIED History of alcohol / drug use?: Yes Longest period of sobriety (when/how long): Ongoing  Negative Consequences of Use: Personal relationships, Financial Substance #1 Name of Substance 1: Heroin  1 - Age of First Use: 20's 1 - Amount  (size/oz): Varies  1 - Frequency: amost daily  1 - Duration: ongoing  1 - Last Use / Amount: unknown Substance #2 Name of Substance 2: Meth  2 - Age of First Use: unknown  2 - Amount (size/oz): varies  2 - Frequency: 3-5X a week  2 - Duration: ongoing  2 - Last Use / Amount: unknown   CIWA: CIWA-Ar BP: (!) 137/93 Pulse Rate: (!) 120 COWS:    Allergies:  Allergies  Allergen Reactions  . Vicodin [Hydrocodone-Acetaminophen] Other (See Comments)    Reaction:  GI upset     Home Medications:  (Not in a hospital admission)  OB/GYN Status:  No LMP for male patient.  General Assessment Data Location of Assessment: [facility] ED TTS Assessment: In system Is this a Tele or Face-to-Face Assessment?: Face-to-Face Is this an Initial Assessment or a Re-assessment for this encounter?: Initial Assessment Patient Accompanied by:: N/A Living Arrangements: Other (Comment) What gender do you identify as?: Male Marital status: Single Living Arrangements: Alone Can pt return to current living arrangement?: Yes Admission Status: Involuntary Petitioner: Family member Is patient capable of signing voluntary admission?: No Referral Source: Self/Family/Friend Insurance type: None   Medical Screening Exam [facility] Walk-in ONLY) Medical Exam completed: Yes  Crisis Care Plan Living Arrangements: Alone Legal Guardian: Other:(none) Name of Psychiatrist: none Name of Therapist: none  Education Status Is patient currently in school?: No Is the patient employed, unemployed or receiving disability?: Unemployed  Risk to self with the past 6 months Suicidal Ideation: No Has patient been a risk to self within the past 6 months prior to admission? : No Suicidal  Intent: No Has patient had any suicidal intent within the past 6 months prior to admission? : No Is patient at risk for suicide?: No, but patient needs Medical Clearance Suicidal Plan?: No Has patient had any suicidal plan within the past 6  months prior to admission? : No Access to Means: No What has been your use of drugs/alcohol within the last 12 months?: Polysubstance abuse  Previous Attempts/Gestures: No How many times?: 0 Triggers for Past Attempts: None known Intentional Self Injurious Behavior: None Family Suicide History: No Persecutory voices/beliefs?: No Depression: Yes Depression Symptoms: Isolating, Guilt Substance abuse history and/or treatment for substance abuse?: Yes Suicide prevention information given to non-admitted patients: Not applicable  Risk to Others within the past 6 months Homicidal Ideation: No Does patient have any lifetime risk of violence toward others beyond the six months prior to admission? : No Thoughts of Harm to Others: No Current Homicidal Intent: No Current Homicidal Plan: No Access to Homicidal Means: No History of harm to others?: No Assessment of Violence: None Noted Does patient have access to weapons?: No Criminal Charges Pending?: No Does patient have a court date: No Is patient on probation?: No  Psychosis Hallucinations: None noted Delusions: None noted  Mental Status Report Appearance/Hygiene: In scrubs Eye Contact: Fair Motor Activity: Freedom of movement Speech: Slow, Slurred Level of Consciousness: Drowsy Mood: Ambivalent Affect: Appropriate to circumstance Anxiety Level: Minimal Thought Processes: Coherent Judgement: Partial Orientation: Time, Situation, Place, Person Obsessive Compulsive Thoughts/Behaviors: None  Cognitive Functioning Concentration: Poor Memory: Remote Intact, Recent Intact Insight: Fair Impulse Control: Fair Appetite: Fair Have you had any weight changes? : No Change Sleep: No Change Total Hours of Sleep: 6 Vegetative Symptoms: None  ADLScreening [facility]) Patient's cognitive ability adequate to safely complete daily activities?: Yes Patient able to express need for assistance with ADLs?: Yes Independently  performs ADLs?: Yes (appropriate for developmental age)  Prior Inpatient Therapy Prior Inpatient Therapy: No  Prior Outpatient Therapy Prior Outpatient Therapy: No Does patient have an ACCT team?: No Does patient have Intensive In-House Services?  : No Does patient have [facility] services? : No Does patient have P4CC services?: No  ADL Screening (condition at time of admission) Patient's cognitive ability adequate to safely complete daily activities?: Yes Patient able to express need for assistance with ADLs?: Yes Independently performs ADLs?: Yes (appropriate for developmental age)             Merchant navy officer (For Healthcare) Does Patient Have a Medical Advance Directive?: No Would patient like information on creating a medical advance directive?: No - Patient declined          Disposition:  Disposition Initial Assessment Completed for this Encounter: Yes Patient referred to: Other (Comment)(Consult with Psy MD)  On Site Evaluation by:   Reviewed with Physician:    Asa Saunas 08/11/2018 12:55 AM

## 2018-08-11 NOTE — ED Notes (Signed)
Patient is talking on the phone calmly.

## 2018-08-11 NOTE — ED Notes (Signed)
Patient is alert and oriented, calm and cooperative, no signs of distress, ate 10% of lunch and beverage.

## 2018-08-11 NOTE — ED Notes (Signed)
Patient is taking a shower, he is calm and cooperative.

## 2018-08-11 NOTE — ED Provider Notes (Signed)
-----------------------------------------   7:13 AM on 08/11/2018 -----------------------------------------   Blood pressure (!) 137/93, pulse (!) 120, temperature 97.7 F (36.5 C), temperature source Oral, resp. rate 16, SpO2 100 %.  The patient had no acute events since last update.  Calm and cooperative at this time.  Disposition is pending Psychiatry/Behavioral Medicine team recommendations.     Irean Hong, MD 08/11/18 [phone]

## 2018-08-12 ENCOUNTER — Inpatient Hospital Stay: Payer: No Typology Code available for payment source

## 2018-08-12 DIAGNOSIS — F1210 Cannabis abuse, uncomplicated: Secondary | ICD-10-CM

## 2018-08-12 MED ORDER — NICOTINE 21 MG/24HR TD PT24: 21.0000 mg | MEDICATED_PATCH | Freq: Every day | TRANSDERMAL | Status: DC

## 2018-08-12 MED ORDER — TRAZODONE HCL 100 MG PO TABS
100.0000 mg | ORAL_TABLET | Freq: Every day | ORAL | Status: DC
  Administered 2018-08-12: 100 mg via ORAL
  Filled 2018-08-12: qty 1

## 2018-08-12 NOTE — Progress Notes (Signed)
Patient room is searched by this nurse, Dedra Skeens RN, Cordelia Pen NT, and Engineer, materials. No contraband found at this time. Will continue to monitor.

## 2018-08-12 NOTE — Progress Notes (Signed)
KUB is negative for any foreign body. Nursing staff aware of contraband found and will monitor patient closely.

## 2018-08-12 NOTE — Progress Notes (Addendum)
This Clinical research associate was notified that a smell of smoke was coming from the patient's room, in which staff searched and stripped his room and bed and nothing was found. This Clinical research associate and another staff member, Charity fundraiser, went to question patient about this finding and he stated that "I flushed the cigarettes". Patient was asked if he had a lighter and he stated "y'all didn't find anything, do you want to search me". This Clinical research associate asked patient if he had any matches and he stated "I plead the fifth". This Clinical research associate, Consulting civil engineer, and security went to search/wand patient again and no contraband was found. MD notified of these findings.

## 2018-08-12 NOTE — Plan of Care (Signed)
Patient has been free from injury thus far on the unit. Patient has the ability to identify the changes in his lifestyle to reduce recurrence of his condition. Patient has the ability to identify the available resources that can assist him in meeting his health-care needs, however, he has not voiced anything to this Clinical research associate. Patient denies SI/HI/AVH as well as any depression/anxiety at this time and his goal for today is "hopefully to go home". Patient remains safe on the unit at this time.  Problem: Safety: Goal: Ability to remain free from injury will improve Outcome: Progressing   Problem: Health Behavior/Discharge Planning: Goal: Ability to identify changes in lifestyle to reduce recurrence of condition will improve Outcome: Progressing Goal: Identification of resources available to assist in meeting health care needs will improve Outcome: Progressing   Problem: Self-Concept: Goal: Ability to disclose and discuss suicidal ideas will improve Outcome: Progressing Goal: Will verbalize positive feelings about self Outcome: Progressing

## 2018-08-12 NOTE — Progress Notes (Signed)
Patient refused morning medication stating to this writer "I'm not depressed". This Clinical research associate will notify MD.

## 2018-08-12 NOTE — Plan of Care (Signed)
Patient was pleasant and denied Si, Hi, AVH. Patient contracted for safety. Patient stated that he was ready to discharge and was told to talk to his doctor about it tomorrow.    Problem: Safety: Goal: Ability to remain free from injury will improve Outcome: Progressing   Problem: Health Behavior/Discharge Planning: Goal: Ability to identify changes in lifestyle to reduce recurrence of condition will improve Outcome: Progressing Goal: Identification of resources available to assist in meeting health care needs will improve Outcome: Progressing   Problem: Self-Concept: Goal: Ability to disclose and discuss suicidal ideas will improve Outcome: Progressing Goal: Will verbalize positive feelings about self Outcome: Progressing

## 2018-08-12 NOTE — Plan of Care (Signed)
Patient is calm  alert and oriented x 4 and resting in bed and responding well to treatment regimen, voice no concerns , denies SI/HIAVH, support and encouragement provided for moral support , patient acknowledged, no distress 15 minute rounding in progress.   Problem: Safety: Goal: Ability to remain free from injury will improve Outcome: Progressing   Problem: Health Behavior/Discharge Planning: Goal: Ability to identify changes in lifestyle to reduce recurrence of condition will improve Outcome: Progressing Goal: Identification of resources available to assist in meeting health care needs will improve Outcome: Progressing   Problem: Self-Concept: Goal: Ability to disclose and discuss suicidal ideas will improve Outcome: Progressing Goal: Will verbalize positive feelings about self Outcome: Progressing

## 2018-08-12 NOTE — Progress Notes (Addendum)
Patient was in bed but not asleep when I assessed him. Patient denied AVH, SI, HI but stated "they had some anxiety, maybe a 7". Patient didn't ask for any medications for anxiety. Patient stated he wasn't in any pain.  Patient stated they had a good day and was ready to discharge. I told the patient to talk to his doctor tomorrow (10/4) about discharge planning. Patient stated he wanted trazodone for sleep tonight. I looked but didn't see an order for it. I paged Doctor Pucilowska and she said she was going to put an order in for trazodone for the patient. Patient was pleasant with me.

## 2018-08-12 NOTE — Progress Notes (Addendum)
[facility] MD Progress Note  08/12/2018 12:40 PM Edwin [name]  MRN:  [id number]    Subjective:   The patient continues to be irritable and argumentative but is also very hurt and still grieving for the loss of his children's mother.  Insight and judgment into severity of substance use remains limited.  He feels hopeless about the future and his ability to change with regards to maintaining sobriety.  He denies any current active or passive suicidal thoughts and continues to state he was not trying to kill himself.  He feels very angry and hurt that his children's mother abandoned him and took all that heroin.  Does not want residential substance abuse treatment and continues to minimize substance use. He also refused morning medication of Celexa.  Appetite is fair.  He did have family come to visit yesterday evening. He did not get vital signs.  No somatic complaints.  No withdrawal symptoms. He is not signing consent to let treatment  Team speak with his family.   Past psychiatric history: The patient denies any prior suicide attempts but was hospitalized at [facility] in January 27, 2016.  He has never been compliant with outpatient psychotropic medication management or therapy.  Family psychiatric history: The patient denies having family members with history of mental illness or substance use  Substance abuse history: The patient reports a history of binge drinking alcohol once every few months, daily marijuana use since age of 9, methamphetamine abuse once every 1 to 2 months for several years, history of cocaine abuse in the past and heroin use once every 1 to 2 months.  He denies abusing opioid pain pills because he says they are too expensive.  He denies abusing Xanax or Klonopin.  He does smoke 1.5 pack of cigarettes per day since his teens  Social history: The patient says his parents separated when he is approximately 32 years old.  He does not get along with either parent but does currently  live with his dad and brother.  He denies any history of any severe physical or sexual abuse.  He got his GED and has worked as an Clinical biochemist in the past.  He has been unemployed since July.  He has never been married but was with the mother of his 2 children until 01/27/2016 when she died of a drug overdose.  He has 33 year old and 55 year old son who lives with his mother.  Legal history: Patient has had multiple charges in the past including simple assault, marijuana charges, theft, driving with a revoked driver's license.  He is currently on probation.    Principal Problem: Major depressive disorder, recurrent severe without psychotic features ([place]) Diagnosis:   Patient Active Problem List   Diagnosis Date Noted  . Major depressive disorder, recurrent episode, severe ([place]) [F33.2] 08/11/2018    Priority: High  . Opioid use disorder, moderate, dependence ([place]) [F11.20]     Priority: High  . Methamphetamine use disorder, moderate ([place]) [F15.20]     Priority: Medium  . Cannabis use disorder, moderate, dependence ([place]) [F12.20]     Priority: Medium  . Tobacco use disorder [F17.200] 01/28/2016  . Major depressive disorder, recurrent severe without psychotic features ([place]) [F33.2]   . Opiate or related narcotic overdose ([place]) [T40.601A] 11/28/2015  . Substance abuse ([place]) [F19.10]    Total Time spent with patient: 20 minutes   Past Medical History:  Past Medical History:  Diagnosis Date  . Drug overdose   . DVT (deep venous thrombosis) ([place])   .  Hepatitis C     Past Surgical History:  Procedure Laterality Date  . DENTAL SURGERY     Family History: History reviewed. No pertinent family history.  Social History:  Social History   Substance and Sexual Activity  Alcohol Use Yes   Comment: beer and liquor, binges     Social History   Substance and Sexual Activity  Drug Use Yes  . Frequency: 1.0 times per week  . Types: Marijuana, IV, Cocaine, Methamphetamines   Comment:  heroin    Social History   Socioeconomic History  . Marital status: Single    Spouse name: Not on file  . Number of children: Not on file  . Years of education: Not on file  . Highest education level: Not on file  Occupational History  . Not on file  Social Needs  . Financial resource strain: Not on file  . Food insecurity:    Worry: Not on file    Inability: Not on file  . Transportation needs:    Medical: Not on file    Non-medical: Not on file  Tobacco Use  . Smoking status: Current Every Day Smoker    Packs/day: 1.00    Years: 20.00    Pack years: 20.00    Types: Cigarettes  . Smokeless tobacco: Never Used  Substance and Sexual Activity  . Alcohol use: Yes    Comment: beer and liquor, binges  . Drug use: Yes    Frequency: 1.0 times per week    Types: Marijuana, IV, Cocaine, Methamphetamines    Comment: heroin  . Sexual activity: Not on file  Lifestyle  . Physical activity:    Days per week: Not on file    Minutes per session: Not on file  . Stress: Not on file  Relationships  . Social connections:    Talks on phone: Not on file    Gets together: Not on file    Attends religious service: Not on file    Active member of club or organization: Not on file    Attends meetings of clubs or organizations: Not on file    Relationship status: Not on file  Other Topics Concern  . Not on file  Social History Narrative  . Not on file   Additional Social History:      Sleep: Fair  Appetite:  Fair  Current Medications: Current Facility-Administered Medications  Medication Dose Route Frequency Provider Last Rate Last Dose  . acetaminophen (TYLENOL) tablet 650 mg  650 mg Oral Q6H PRN Chauncey Mann, MD      . alum & mag hydroxide-simeth (MAALOX/MYLANTA) 200-200-20 MG/5ML suspension 30 mL  30 mL Oral Q4H PRN Chauncey Mann, MD      . citalopram (CELEXA) tablet 20 mg  20 mg Oral Daily Chauncey Mann, MD      . ibuprofen (ADVIL,MOTRIN) tablet 600 mg  600 mg Oral Q6H  PRN Chauncey Mann, MD      . loperamide (IMODIUM) capsule 2 mg  2 mg Oral Q8H PRN Chauncey Mann, MD      . LORazepam (ATIVAN) tablet 0.5 mg  0.5 mg Oral Q6H PRN Chauncey Mann, MD      . magnesium hydroxide (MILK OF MAGNESIA) suspension 30 mL  30 mL Oral Daily PRN Chauncey Mann, MD      . nicotine (NICODERM CQ - dosed in mg/24 hours) patch 21 mg  21 mg Transdermal Once Chauncey Mann, MD  21 mg at 08/11/18 1537    Lab Results:  Results for orders placed or performed during the hospital encounter of 08/10/18 (from the past 48 hour(s))  Comprehensive metabolic panel     Status: Abnormal   Collection Time: 08/10/18  5:39 PM  Result Value Ref Range   Sodium 141 135 - 145 mmol/L   Potassium 4.5 3.5 - 5.1 mmol/L   Chloride 103 98 - 111 mmol/L   CO2 28 22 - 32 mmol/L   Glucose, Bld 149 (H) 70 - 99 mg/dL   BUN 18 6 - 20 mg/dL   Creatinine, Ser 1.01 0.61 - 1.24 mg/dL   Calcium 10.0 8.9 - 10.3 mg/dL   Total Protein 8.6 (H) 6.5 - 8.1 g/dL   Albumin 5.1 (H) 3.5 - 5.0 g/dL   AST 20 15 - 41 U/L   ALT 16 0 - 44 U/L   Alkaline Phosphatase 62 38 - 126 U/L   Total Bilirubin 0.8 0.3 - 1.2 mg/dL   GFR calc non Af Amer >60 >60 mL/min   GFR calc Af Amer >60 >60 mL/min    Comment: (NOTE) The eGFR has been calculated using the CKD EPI equation. This calculation has not been validated in all clinical situations. eGFR's persistently <60 mL/min signify possible Chronic Kidney Disease.    Anion gap 10 5 - 15    Comment: Performed at [facility], [address] [place], [place] [address]  Ethanol     Status: None   Collection Time: 08/10/18  5:39 PM  Result Value Ref Range   Alcohol, Ethyl (B) <10 <10 mg/dL    Comment: (NOTE) Lowest detectable limit for serum alcohol is 10 mg/dL. For medical purposes only. Performed at [facility], [address] [place], [place] [address]   Salicylate level     Status: None   Collection Time: 08/10/18  5:39 PM  Result Value Ref Range    Salicylate Lvl <0.5 2.8 - 30.0 mg/dL    Comment: Performed at [facility], [address] [place], [place] [address]  Acetaminophen level     Status: Abnormal   Collection Time: 08/10/18  5:39 PM  Result Value Ref Range   Acetaminophen (Tylenol), Serum <10 (L) 10 - 30 ug/mL    Comment: (NOTE) Therapeutic concentrations vary significantly. A range of 10-30 ug/mL  may be an effective concentration for many patients. However, some  are best treated at concentrations outside of this range. Acetaminophen concentrations >150 ug/mL at 4 hours after ingestion  and >50 ug/mL at 12 hours after ingestion are often associated with  toxic reactions. Performed at [facility], [address] [place], [place] [address]   cbc     Status: None   Collection Time: 08/10/18  5:39 PM  Result Value Ref Range   WBC 8.1 4.0 - 10.5 K/uL   RBC 4.79 4.22 - 5.81 MIL/uL   Hemoglobin 15.1 13.0 - 17.0 g/dL   HCT 43.8 39.0 - 52.0 %   MCV 91.4 80.0 - 100.0 fL   MCH 31.5 26.0 - 34.0 pg   MCHC 34.5 30.0 - 36.0 g/dL   RDW 12.9 11.5 - 15.5 %   Platelets 290 150 - 400 K/uL   nRBC 0.0 0.0 - 0.2 %    Comment: Performed at [facility], [address], [place], [place] [address]  Urine Drug Screen, Qualitative     Status: Abnormal   Collection Time: 08/10/18  9:24 PM  Result Value Ref Range   Tricyclic, Ur Screen NONE DETECTED NONE DETECTED   Amphetamines, Ur Screen POSITIVE (A) NONE DETECTED   MDMA (Ecstasy)Ur Screen NONE DETECTED NONE DETECTED   Cocaine Metabolite,Ur [place] NONE DETECTED NONE DETECTED   Opiate, Ur Screen POSITIVE (A) NONE DETECTED   Phencyclidine (PCP) Ur S NONE DETECTED NONE DETECTED   Cannabinoid 50 Ng, Ur [place] POSITIVE (A) NONE DETECTED   Barbiturates, Ur Screen NONE DETECTED NONE DETECTED   Benzodiazepine, Ur Scrn NONE DETECTED NONE DETECTED   Methadone Scn, Ur NONE DETECTED NONE DETECTED    Comment: (NOTE) Tricyclics + metabolites, urine    Cutoff 1000  ng/mL Amphetamines + metabolites, urine  Cutoff 1000 ng/mL MDMA (Ecstasy), urine              Cutoff 500 ng/mL Cocaine Metabolite, urine          Cutoff 300 ng/mL Opiate + metabolites, urine        Cutoff 300 ng/mL Phencyclidine (PCP), urine         Cutoff 25 ng/mL Cannabinoid, urine                 Cutoff 50 ng/mL Barbiturates + metabolites, urine  Cutoff 200 ng/mL Benzodiazepine, urine              Cutoff 200 ng/mL Methadone, urine                   Cutoff 300 ng/mL The urine drug screen provides only a preliminary, unconfirmed analytical test result and should not be used for non-medical purposes. Clinical consideration and professional judgment should be applied to any positive drug screen result due to possible interfering substances. A more specific alternate chemical method must be used in order to obtain a confirmed analytical result. Gas chromatography / mass spectrometry (GC/MS) is the preferred confirmat ory method. Performed at [facility], [address] [place], [place] [address]     Blood Alcohol level:  Lab Results  Component Value Date   [facility] <10 08/10/2018   ETH <10 81/10/7508    Metabolic Disorder Labs: Lab Results  Component Value Date   HGBA1C 5.8 01/21/2014   No results found for: PROLACTIN Lab Results  Component Value Date   TRIG 124 02/11/2014   Musculoskeletal: Strength & Muscle Tone: within normal limits Gait & Station: normal Patient leans: N/A  Psychiatric Specialty Exam: Physical Exam  Nursing note and vitals reviewed.   Review of Systems  Constitutional: Negative.   HENT: Negative.   Eyes: Negative.   Respiratory: Negative.   Cardiovascular: Negative.   Gastrointestinal: Negative.   Musculoskeletal: Negative.   Skin: Negative.   Neurological: Negative.     Blood pressure 125/71, pulse 80, temperature 97.9 F (36.6 C), temperature source Oral, resp. rate 17, height '5\' 8"'  (1.727 m), weight 77.1 kg, SpO2 100 %.Body  mass index is 25.85 kg/m.  General Appearance: Casual  Eye Contact:  Good  Speech:  Clear and Coherent and Normal Rate  Volume:  Normal  Mood:  Depressed  Affect:  Irritable  Thought Process:  Coherent, Goal Directed and Linear  Orientation:  Full (Time, Place, and Person)  Thought Content:  Logical  Suicidal Thoughts:  No  Homicidal Thoughts:  No  Memory:  Immediate;   Good Recent;   Good Remote;   Good  Judgement:  Poor  Insight:  Lacking  Psychomotor Activity:  Normal  Concentration:  Concentration: Good and Attention Span: Good  Recall:  Good  Fund of Knowledge:  Good  Language:  Good  Akathisia:  No  Handed:  Right  AIMS (if indicated):     Assets:  Communication Skills Housing Physical Health Social Support  ADL's:  Intact  Cognition:  WNL  Sleep:  Number of Hours: 5.15     Treatment Plan Summary:   Major depressive disorder, recurrent versus substance-induced mood disorder Opioid use disorder, severe Amphetamine use disorder, severe Alcohol use disorder, mild Cannabis use disorder, severe Cocaine use disorder in remission Severe: Legal charges, family conflict, loss of girlfriend  Major depressive disorder, recurrent versus substance-induced mood disorder: -The patient was offered Celexa 20 mg p.o. daily for depression but is refusing the medication -He denies any current active or passive suicidal thoughts and continues to minimize substance use and overdose.  Opioid use disorder, severe, Amphetamine use disorder, severe, Alcohol use disorder, mild, Cannabis use disorder, severe, Cocaine use disorder in remission:  -The patient was advised to abstain from all illicit drugs and alcohol as it may worsen mood symptoms.   -The patient currently is on probation and has legal charges.  He has already had positive drug screens in the past.  - It was recommended that he go to residential substance abuse treatment although he is resistant and fears going to any  kind of facility for treatment.  - He would also be an ideal candidate for Suboxone  Due to IVDU will check HIV.  It is unclear whether or not he is positive for hepatitis C.  Past lab values are negative for hepatitis C but the patient says he has hepatitis.  Tobacco use disorder:  -He was offered a nicotine patch.   -He was educated about negative consequences of tobacco and health  Disposition:  -He is not signing consent to let treatment team speak with his family. -The patient does have a stable living situation but was advised to go to [facility] for residential substance overdose.  -He is at high risk of overdose/death with current substance use. He will need psychotropic medication management follow-up appointment at the time of discharge.     Daily contact with patient to assess and evaluate symptoms and progress in treatment and Medication management  Chauncey Mann, MD 08/12/2018, 12:40 PM

## 2018-08-12 NOTE — Progress Notes (Addendum)
D- Patient alert and oriented. Patient presents in a pleasant mood on assessment stating that he didn't sleep well last night. Patient denies SI, HI, AVH, and pain at this time. Patient also denies any depression and anxiety stating "I'm being held against my will". Patient's goal for today is "hopefully to go home".  A- Support and encouragement provided.  Routine safety checks conducted every 15 minutes. Patient informed to notify staff with problems or concerns.  R- Patient contracts for safety at this time. Patient compliant with treatment plan. Patient receptive, calm, and cooperative. Patient interacts well with others on the unit. Patient remains safe at this time.

## 2018-08-12 NOTE — Progress Notes (Signed)
When walking the hall a strong odor of cigarette is coming from patients room. On further investigation patient bathroom is smoke filled and ashes are discovered in toilet. Patient's room is searched and no contraband is found. Dr. Maryruth Bun is notified of findings.

## 2018-08-12 NOTE — [facility] Suicide Risk Assessment (Signed)
[facility] INPATIENT:  Family/Significant Other Suicide Prevention Education  Suicide Prevention Education:  Patient Refusal for Family/Significant Other Suicide Prevention Education: The patient Edwin [name] has refused to provide written consent for family/significant other to be provided Family/Significant Other Suicide Prevention Education during admission and/or prior to discharge.  Physician notified.  Marki Frede  CUEBAS-COLON 08/12/2018, 10:24 AM

## 2018-08-12 NOTE — [facility] Group Notes (Signed)
LCSW Group Therapy Note 08/12/2018 1:15pm  Type of Therapy and Topic: Group Therapy: Feelings Around Returning Home & Establishing a Supportive Framework and Supporting Oneself When Supports Not Available  Participation Level: Did Not Attend  Description of Group:  Patients first processed thoughts and feelings about upcoming discharge. These included fears of upcoming changes, lack of change, new living environments, judgements and expectations from others and overall stigma of mental health issues. The group then discussed the definition of a supportive framework, what that looks and feels like, and how do to discern it from an unhealthy non-supportive network. The group identified different types of supports as well as what to do when your family/friends are less than helpful or unavailable  Therapeutic Goals  1. Patient will identify one healthy supportive network that they can use at discharge. 2. Patient will identify one factor of a supportive framework and how to tell it from an unhealthy network. 3. Patient able to identify one coping skill to use when they do not have positive supports from others. 4. Patient will demonstrate ability to communicate their needs through discussion and/or role plays.  Summary of Patient Progress:  Pt was invited to attend group but chose not to attend. CSW will continue to encourage pt to attend group throughout their admission.   Therapeutic Modalities Cognitive Behavioral Therapy Motivational Interviewing   Edwin [name]  CUEBAS-COLON, LCSW 08/12/2018 12:11 PM

## 2018-08-12 NOTE — Progress Notes (Addendum)
Notified by nursing that patient was smoking in his room. The patient himself is denying allegations but was telling nursing that the lighter may be on/inside him. Search of patients room showed no contraband. Will do KUB to check to see if object was swallowed.

## 2018-08-12 NOTE — [facility] Counselor (Signed)
Adult Comprehensive Assessment  Patient ID: Edwin [name], male   DOB: [birth year], 32 y.o.   MRN: [id number]  Information Source: Information source: Patient  Current Stressors:  Patient states their primary concerns and needs for treatment are:: "I took some drugs" Patient states their goals for this hospitilization and ongoing recovery are:: "I'm just ready to go home" Educational / Learning stressors: none reported Employment / Job issues: none reported Family Relationships: "goodEngineer, petroleum / Lack of resources (include bankruptcy): employed Housing / Lack of housing: stable Physical health (include injuries & life threatening diseases): none reported Social relationships: "good" Substance abuse: heroin, meth Bereavement / Loss: "my kids mom died 2 years ago"  Living/Environment/Situation:  Living Arrangements: Parent Who else lives in the home?: dad, grandmother How long has patient lived in current situation?: 7 years What is atmosphere in current home: Comfortable  Family History:  Marital status: Single Are you sexually active?: Yes What is your sexual orientation?: Heterosexual  Has your sexual activity been affected by drugs, alcohol, medication, or emotional stress?: None reported  Does patient have children?: Yes How many children?: 2 How is patient's relationship with their children?: 2 sons, pt does not have custody of them. They live with his mother but he sees them frequently.   Childhood History:  By whom was/is the patient raised?: Both parents Additional childhood history information: Parents divorced.  Description of patient's relationship with caregiver when they were a child: "they were for their own happiness" Patient's description of current relationship with people who raised him/her: "there aint much of one" How were you disciplined when you got in trouble as a child/adolescent?: Physical  Does patient have siblings?: Yes Number of Siblings:  2 Description of patient's current relationship with siblings: "not close to them" Did patient suffer any verbal/emotional/physical/sexual abuse as a child?: No Did patient suffer from severe childhood neglect?: No Has patient ever been sexually abused/assaulted/raped as an adolescent or adult?: No Was the patient ever a victim of a crime or a disaster?: No Witnessed domestic violence?: No Has patient been effected by domestic violence as an adult?: No  Education:  Highest grade of school patient has completed: GED Currently a Consulting civil engineer?: No Learning disability?: No  Employment/Work Situation:   Employment situation: Employed Where is patient currently employed?: [employer] How long has patient been employed?: 3 years Patient's job has been impacted by current illness: No What is the longest time patient has a held a job?: 6 years but was fired after going to jail.  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  Did You Receive Any Psychiatric Treatment/Services While in the Military?: No Are There Guns or Other Weapons in Your Home?: No  Financial Resources:   Financial resources: Income from employment Does patient have a Lawyer or guardian?: No  Alcohol/Substance Abuse:   Alcohol/Substance Abuse Treatment Hx: Past Tx, Inpatient If yes, describe treatment: IP PSYCH UNIT Has alcohol/substance abuse ever caused legal problems?: Yes(PROBATION - paraphenalia)  Social Support System:   Patient's Community Support System: None Describe Community Support System: "I dont have anybody to talk to" Type of faith/religion: none reported How does patient's faith help to cope with current illness?: no  Leisure/Recreation:   Leisure and Hobbies: "I dont have any"  Strengths/Needs:   What is the patient's perception of their strengths?: none reported Patient states they can use these personal strengths during their treatment to contribute  to their recovery: "I dont want to  be here" Patient states these barriers may affect/interfere with their treatment: none reported  Patient states these barriers may affect their return to the community: none reported  Discharge Plan:   Currently receiving community mental health services: No Patient states concerns and preferences for aftercare planning are: TBD with CSW Patient states they will know when they are safe and ready for discharge when: "I'm here against my will" Does patient have access to transportation?: No Does patient have financial barriers related to discharge medications?: No Plan for no access to transportation at discharge: TBD with CSW  Will patient be returning to same living situation after discharge?: Yes  Summary/Recommendations:   Summary and Recommendations (to be completed by the evaluator): Patient is a 32 year old male admitted involuntarily and diagnosed with Major depressive disorder, recurrent severe without psychotic features. The patient has a history of polysubstance to dependence who was brought to the emergency room under involuntary commitment taken out by his father.  The patient was at home and took an unintentional overdose of heroin. The patient does report severe depressive symptoms since the death of his girlfriend and mother of his 2 children in 2017. Patient will benefit from crisis stabilization, medication evaluation, group therapy and psychoeducation. In addition to case management for discharge planning. At discharge it is recommended that patient adhere to the established discharge plan and continue treatment.   Edwin [name]  CUEBAS-COLON. 08/12/2018

## 2018-08-13 LAB — HEPATITIS C ANTIBODY

## 2018-08-13 MED ORDER — NALOXONE HCL 4 MG/0.1ML NA LIQD: NASAL | 1 refills | Status: DC

## 2018-08-13 NOTE — Progress Notes (Signed)
D - Patient was alert and oriented. Patient was in his bed but awake upon assessment. Patient denied AVH, SI, HI. Patient stated he had some anxiety and rated it "maybe a 7". Patient didn't request any medications for anxiety. Patient didn't have any pain at this time. Patient stated he wanted to be discharged, I told patient to talk to his doctor about it tomorrow. Patient asked for trazodone, there wasn't an order for it in the [facility] so I paged doctor Pucilowska and she put an order in for the trazodone.   A - Support and encouragement provided. Safety checks conducted Q 15 minutes per unit protocol. Patient informed to notify staff with any problems.   R - Patient contracts for safety at this time. Patient compliant with treatment plan and medications. Patient receptive, calm, and cooperative. Patient was isolated in the room other than at snack time. Patient remains safe at this time.

## 2018-08-13 NOTE — [facility] Suicide Risk Assessment (Signed)
[facility] Discharge Suicide Risk Assessment   Principal Problem: Major depressive disorder, recurrent severe without psychotic features [facility]) Discharge Diagnoses:  Patient Active Problem List   Diagnosis Date Noted  . Major depressive disorder, recurrent severe without psychotic features (HCC) [F33.2]     Priority: High  . Cannabis abuse [F12.10]   . Major depressive disorder, recurrent episode, severe (HCC) [F33.2] 08/11/2018  . 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 Cannabis use disorder, moderate, dependence (HCC) [F12.20]   . Tobacco use disorder [F17.200] 01/28/2016  . Uncomplicated opioid dependence (HCC) [F11.20]   . Opiate or related narcotic overdose (HCC) [T40.601A] 11/28/2015  . Substance abuse (HCC) [F19.10]     Total Time spent with patient: 20 minutes  Musculoskeletal: Strength & Muscle Tone: within normal limits Gait & Station: normal Patient leans: N/A  Psychiatric Specialty Exam: Review of Systems  Neurological: Negative.   Psychiatric/Behavioral: Positive for substance abuse.  All other systems reviewed and are negative.   Blood pressure 127/74, pulse 79, temperature 98.1 F (36.7 C), temperature source Oral, resp. rate 11, height 5\' 8"  (1.727 m), weight 77.1 kg, SpO2 100 %.Body mass index is 25.85 kg/m.  General Appearance: Casual  Eye Contact::  Good  Speech:  Clear and Coherent409  Volume:  Normal  Mood:  Dysphoric  Affect:  Appropriate  Thought Process:  Goal Directed and Descriptions of Associations: Intact  Orientation:  Full (Time, Place, and Person)  Thought Content:  WDL  Suicidal Thoughts:  No  Homicidal Thoughts:  No  Memory:  Immediate;   Fair Recent;   Fair Remote;   Fair  Judgement:  Poor  Insight:  Lacking  Psychomotor Activity:  Normal  Concentration:  Fair  Recall:  [employer] of Knowledge:Fair  Language: Fair  Akathisia:  No  Handed:  Right  AIMS (if indicated):     Assets:  Communication Skills Desire for  Improvement Financial Resources/Insurance Housing Physical Health Resilience Social Support  Sleep:  Number of Hours: 6.45  Cognition: WNL  ADL's:  Intact   Mental Status Per Nursing Assessment::   On Admission:  NA  Demographic Factors:  Male, Caucasian and Low socioeconomic status  Loss Factors: Loss of significant relationship  Historical Factors: Prior suicide attempts, Family history of mental illness or substance abuse and Impulsivity  Risk Reduction Factors:   Responsible for children under 58 years of age, Living with another person, especially a relative and Positive social support  Continued Clinical Symptoms:  Depression:   Comorbid alcohol abuse/dependence Impulsivity Alcohol/Substance Abuse/Dependencies  Cognitive Features That Contribute To Risk:  None    Suicide Risk:  Minimal: No identifiable suicidal ideation.  Patients presenting with no risk factors but with morbid ruminations; may be classified as minimal risk based on the severity of the depressive symptoms    Plan Of Care/Follow-up recommendations:  Activity:  as tolerated Diet:  regular Other:  keep follow up appointments  Kristine Linea, MD 08/13/2018, 9:13 AM

## 2018-08-13 NOTE — Progress Notes (Signed)
Patient smoking in bedroom. Room and items searched again. Paitient gave Water quality scientist and strike pad. Reports had it hidden in encaps of bed and behind rubber seal on window. Patient scheduled for discharge today, Denies SI, HI, AVH. Medication compliant.

## 2018-08-13 NOTE — Progress Notes (Signed)
   08/13/18 1030  Clinical Encounter Type  Visited With Patient  Visit Type Initial;Spiritual support;Behavioral Health  Referral From Social work  Consult/Referral To Chaplain  Spiritual Encounters  Spiritual Needs Emotional;Other (Comment)   CH attended the patient's treatment team meeting. Edwin [name] was being released after contraband (cigarettes) were found in the patient's room. The patient was caught smoking in his room twice in the last day. The patient seemed pleased to be leaving to go home to his father's home. He stated no goals except to not come back.

## 2018-08-13 NOTE — Tx Team (Signed)
Interdisciplinary Treatment and Diagnostic Plan Update  08/13/2018 Time of Session: 10:30am Edwin [name] MRN: [id number]  Principal Diagnosis: Major depressive disorder, recurrent severe without psychotic features ([place])  Secondary Diagnoses: Principal Problem:   Major depressive disorder, recurrent severe without psychotic features ([place]) Active Problems:   Uncomplicated opioid dependence ([place])   Tobacco use disorder   Major depressive disorder, recurrent episode, severe (HCC)   Methamphetamine abuse ([place])   Cannabis use disorder, moderate, dependence ([place])   Cannabis abuse   Current Medications:  Current Facility-Administered Medications  Medication Dose Route Frequency Provider Last Rate Last Dose  . acetaminophen (TYLENOL) tablet 650 mg  650 mg Oral Q6H PRN Chauncey Mann, MD      . alum & mag hydroxide-simeth (MAALOX/MYLANTA) 200-200-20 MG/5ML suspension 30 mL  30 mL Oral Q4H PRN Chauncey Mann, MD      . citalopram (CELEXA) tablet 20 mg  20 mg Oral Daily Chauncey Mann, MD   20 mg at 08/13/18 2620  . ibuprofen (ADVIL,MOTRIN) tablet 600 mg  600 mg Oral Q6H PRN Chauncey Mann, MD   600 mg at 08/12/18 1748  . loperamide (IMODIUM) capsule 2 mg  2 mg Oral Q8H PRN Chauncey Mann, MD   2 mg at 08/12/18 1748  . LORazepam (ATIVAN) tablet 0.5 mg  0.5 mg Oral Q6H PRN Chauncey Mann, MD      . magnesium hydroxide (MILK OF MAGNESIA) suspension 30 mL  30 mL Oral Daily PRN Chauncey Mann, MD      . nicotine (NICODERM CQ - dosed in mg/24 hours) patch 21 mg  21 mg Transdermal Daily Chauncey Mann, MD      . traZODone (DESYREL) tablet 100 mg  100 mg Oral QHS Pucilowska, Jolanta B, MD   100 mg at 08/12/18 2128   Current Outpatient Medications  Medication Sig Dispense Refill  . naloxone (NARCAN) nasal spray 4 mg/0.1 mL Use as directed 2 kit 1   PTA Medications: No medications prior to admission.    Patient Stressors: Arts development officer issue Substance abuse  Patient  Strengths: Ability for insight Average or above average intelligence Communication skills Supportive family/friends  Treatment Modalities: Medication Management, Group therapy, Case management,  1 to 1 session with clinician, Psychoeducation, Recreational therapy.   Physician Treatment Plan for Primary Diagnosis: Major depressive disorder, recurrent severe without psychotic features ([place]) Long Term Goal(s): Improvement in symptoms so as ready for discharge Improvement in symptoms so as ready for discharge   Short Term Goals: Ability to verbalize feelings will improve Ability to disclose and discuss suicidal ideas Ability to demonstrate self-control will improve Ability to identify triggers associated with substance abuse/mental health issues will improve Ability to identify changes in lifestyle to reduce recurrence of condition will improve Ability to verbalize feelings will improve Ability to disclose and discuss suicidal ideas Ability to identify and develop effective coping behaviors will improve Compliance with prescribed medications will improve Ability to identify triggers associated with substance abuse/mental health issues will improve  Medication Management: Evaluate patient's response, side effects, and tolerance of medication regimen.  Therapeutic Interventions: 1 to 1 sessions, Unit Group sessions and Medication administration.  Evaluation of Outcomes: Adequate for Discharge  Physician Treatment Plan for Secondary Diagnosis: Principal Problem:   Major depressive disorder, recurrent severe without psychotic features ([place]) Active Problems:   Uncomplicated opioid dependence ([place])   Tobacco use disorder   Major depressive disorder, recurrent episode, severe ([place])   Methamphetamine abuse ([place])  Cannabis use disorder, moderate, dependence (HCC)   Cannabis abuse  Long Term Goal(s): Improvement in symptoms so as ready for discharge Improvement in symptoms so as ready for  discharge   Short Term Goals: Ability to verbalize feelings will improve Ability to disclose and discuss suicidal ideas Ability to demonstrate self-control will improve Ability to identify triggers associated with substance abuse/mental health issues will improve Ability to identify changes in lifestyle to reduce recurrence of condition will improve Ability to verbalize feelings will improve Ability to disclose and discuss suicidal ideas Ability to identify and develop effective coping behaviors will improve Compliance with prescribed medications will improve Ability to identify triggers associated with substance abuse/mental health issues will improve     Medication Management: Evaluate patient's response, side effects, and tolerance of medication regimen.  Therapeutic Interventions: 1 to 1 sessions, Unit Group sessions and Medication administration.  Evaluation of Outcomes: Adequate for Discharge   RN Treatment Plan for Primary Diagnosis: Major depressive disorder, recurrent severe without psychotic features ([place]) Long Term Goal(s): Knowledge of disease and therapeutic regimen to maintain health will improve  Short Term Goals: Ability to verbalize feelings will improve, Ability to identify and develop effective coping behaviors will improve and Compliance with prescribed medications will improve  Medication Management: RN will administer medications as ordered by provider, will assess and evaluate patient's response and provide education to patient for prescribed medication. RN will report any adverse and/or side effects to prescribing provider.  Therapeutic Interventions: 1 on 1 counseling sessions, Psychoeducation, Medication administration, Evaluate responses to treatment, Monitor vital signs and CBGs as ordered, Perform/monitor CIWA, COWS, AIMS and Fall Risk screenings as ordered, Perform wound care treatments as ordered.  Evaluation of Outcomes: Adequate for Discharge   LCSW  Treatment Plan for Primary Diagnosis: Major depressive disorder, recurrent severe without psychotic features ([place]) Long Term Goal(s): Safe transition to appropriate next level of care at discharge, Engage patient in therapeutic group addressing interpersonal concerns.  Short Term Goals: Engage patient in aftercare planning with referrals and resources  Therapeutic Interventions: Assess for all discharge needs, 1 to 1 time with Social worker, Explore available resources and support systems, Assess for adequacy in community support network, Educate family and significant other(s) on suicide prevention, Complete Psychosocial Assessment, Interpersonal group therapy.  Evaluation of Outcomes: Adequate for Discharge   Progress in Treatment: Attending groups: No. Participating in groups: No. Taking medication as prescribed: No. Toleration medication: No. Family/Significant other contact made: No, will contact:  Patient refused Patient understands diagnosis: No. Discussing patient identified problems/goals with staff: No. Medical problems stabilized or resolved: Yes. Denies suicidal/homicidal ideation: Yes. Issues/concerns per patient self-inventory: No. Other:  New problem(s) identified: No, Describe:  None  New Short Term/Long Term Goal(s): "To stay sober."  Patient Goals:  "To stay sober."  Discharge Plan or Barriers: To discharge home and follow up with [facility].  Reason for Continuation of Hospitalization: Medication stabilization  Estimated Length of Stay: Discharging today  Attendees: Patient: Edwin [name] 08/13/2018 11:59 AM  Physician: Dr. Bary Leriche, MD 08/13/2018 11:59 AM  Nursing: Polly Cobia, RN 08/13/2018 11:59 AM  RN Care Manager: 08/13/2018 11:59 AM  Social Worker: Darin Engels, [place] 08/13/2018 11:59 AM  Recreational Therapist: Isaias Sakai. [facility] [facility], LRT 08/13/2018 11:59 AM  Other: Skipper Cliche, LCSW 08/13/2018 11:59 AM  Other: Marney Doctor, [place]  08/13/2018 11:59 AM  Other: 08/13/2018 11:59 AM    Scribe for Treatment Team: Darin Engels, LCSW 08/13/2018 11:59 AM

## 2018-08-13 NOTE — Progress Notes (Signed)
Patient discharged home. DC instructions provided and explained. Medications reviewed. Rx given. All questions answered. Denies SI, HI, AVH. Belongings returned. Pt stable at discharge. 

## 2018-08-13 NOTE — Progress Notes (Signed)
  [facility] Adult Case Management Discharge Plan :  Will you be returning to the same living situation after discharge:  Yes,  Home At discharge, do you have transportation home?: Yes,  Father to come at discharge Do you have the ability to pay for your medications: Yes,  Referred to a provider who can assist  Release of information consent forms completed and in the chart;  Patient's signature needed at discharge.  Patient to Follow up at: Follow-up Information    Pc, [facility]. Go on 08/17/2018.   Why:  Please follow up with [facility] via [facility]. The walk-in clinic is avalible Monday-Friday from 8:30AM -5PM. Bring your discharge paperwork, ID and all medications that you are taking.  Contact information: [address] [place] [address] [phone]           Next level of care provider has access to [facility] Link:no  Safety Planning and Suicide Prevention discussed: Yes,  Completed with patient. Patient refused referral  Have you used any form of tobacco in the last 30 days? (Cigarettes, Smokeless Tobacco, Cigars, and/or Pipes): Yes  Has patient been referred to the Quitline?: Patient refused referral  Patient has been referred for addiction treatment: Pt. refused referral  Johny Shears, LCSW 08/13/2018, 10:16 AM

## 2018-08-13 NOTE — Discharge Summary (Signed)
Physician Discharge Summary Note  Patient:  Edwin [name] is an 32 y.o., male MRN:  [id number] DOB:  [birth year] Patient phone:  [phone] (home)  Patient address:   [address] [place] [address],  Total Time spent with patient: 20 minutes plus 15 min on care coordination and documentation.  Date of Admission:  08/11/2018 Date of Discharge: 08/13/2018  Reason for Admission: overdose.  History of Present Illness:  Mr. Junkin is a 32 year old single Caucasian male with a history of polysubstance to dependence who was brought to the emergency room under involuntary commitment taken out by his father.  The patient was at home and took an unintentional overdose of heroin.  He had decreased respirations.  He was given a sternal rub and became conscious.  No naloxone was given.  The patient Narcan was given.  The patient does report severe depressive symptoms since the death of his girlfriend and mother of his 2 children in 2017.  While he does report depression, he denies that he was trying to hurt himself.  He admits to polysubstance use including recent methamphetamine use, heroin use and marijuana use. He was positive for all 3 substances.  The patient denies any frequent crying spells or anhedonia but does endorse feelings of hopelessness secondary to multiple stressors.  He is currently on probation but feels he will be violated for his probation secondary to driving with a revoked driver's license and also not paying the necessary findings.  He has had multiple charges in the past for simple assault and marijuana and has been incarcerated in the past as well.  He is resistant to going to residential substance abuse treatment because he fears being "locked away" somewhere.  He denies any current active or passive suicidal thoughts.  He denies any homicidal thoughts.  He denies any auditory or visual hallucinations.  No paranoid thoughts or delusions.  The patient denies any history of any  symptoms consistent with bipolar mood including grandiose delusions, racing thoughts, decreased sleep with increased goal directed behavior, hyperreligious thoughts or hypersexual behavior.He.  He appears to have limited insight into severity of substance use and its affect on his family including his children.  He currently lives with his father and grandmother.  He has 2 children, ages 40 and 39 but lives with his biological mother in another city.  He has had a strange relationship with both parents.  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  The patient feels he is struggling with grief secondary to the loss of his children's mother and would prefer counseling over any medications.    Past psychiatric history: The patient denies any prior suicide attempts but was hospitalized at [facility] in 2017.  He has never been compliant with outpatient psychotropic medication management or therapy.  Family psychiatric history: The patient denies having family members with history of mental illness or substance use  Substance abuse history: The patient reports a history of binge drinking alcohol once every few months, daily marijuana use since age of 86, methamphetamine abuse once every 1 to 2 months for several years, history of cocaine abuse in the past and heroin use once every 1 to 2 months.  He denies abusing opioid pain pills because he says they are too expensive.  He denies abusing Xanax or Klonopin.  He does smoke 1.5 pack of cigarettes per day since his teens  Social history: The patient says his parents separated when he  is approximately 32 years old.  He does not get along with either parent but does currently live with his dad and brother.  He denies any history of any severe physical or sexual abuse.  He got his GED and has worked as an Personnel officer in the past.  He has been unemployed since July.  He has never been married but was with the mother of  his 2 children until 04-04-2016 when she died of a drug overdose.  He has 33 year old and 48 year old son who lives with his mother.  Legal history: Patient has had multiple charges in the past including simple assault, marijuana charges, theft, driving with a revoked driver's license.  He is currently on probation.  Associated Signs/Symptoms: Depression Symptoms:  depressed mood, (Hypo) Manic Symptoms: None Anxiety Symptoms:  Denies Psychotic Symptoms:  Denies PTSD Symptoms:Denies  Principal Problem: Major depressive disorder, recurrent severe without psychotic features [facility]) Discharge Diagnoses: Patient Active Problem List   Diagnosis Date Noted  . Major depressive disorder, recurrent severe without psychotic features (HCC) [F33.2]     Priority: High  . Cannabis abuse [F12.10]   . Major depressive disorder, recurrent episode, severe (HCC) [F33.2] 08/11/2018  . 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 Cannabis use disorder, moderate, dependence (HCC) [F12.20]   . Tobacco use disorder [F17.200] 01/28/2016  . Uncomplicated opioid dependence (HCC) [F11.20]   . Opiate or related narcotic overdose (HCC) [T40.601A] 11/28/2015  . Substance abuse (HCC) [F19.10]    Past Medical History:  Past Medical History:  Diagnosis Date  . Drug overdose   . DVT (deep venous thrombosis) (HCC)   . Hepatitis C     Past Surgical History:  Procedure Laterality Date  . DENTAL SURGERY     Family History: History reviewed. No pertinent family history.  Social History:  Social History   Substance and Sexual Activity  Alcohol Use Yes   Comment: beer and liquor, binges     Social History   Substance and Sexual Activity  Drug Use Yes  . Frequency: 1.0 times per week  . Types: Marijuana, IV, Cocaine, Methamphetamines   Comment: heroin    Social History   Socioeconomic History  . Marital status: Single    Spouse name: Not on file  . Number of children: Not on file  . Years of education: Not  on file  . Highest education level: Not on file  Occupational History  . Not on file  Social Needs  . Financial resource strain: Not on file  . Food insecurity:    Worry: Not on file    Inability: Not on file  . Transportation needs:    Medical: Not on file    Non-medical: Not on file  Tobacco Use  . Smoking status: Current Every Day Smoker    Packs/day: 1.00    Years: 20.00    Pack years: 20.00    Types: Cigarettes  . Smokeless tobacco: Never Used  Substance and Sexual Activity  . Alcohol use: Yes    Comment: beer and liquor, binges  . Drug use: Yes    Frequency: 1.0 times per week    Types: Marijuana, IV, Cocaine, Methamphetamines    Comment: heroin  . Sexual activity: Not on file  Lifestyle  . Physical activity:    Days per week: Not on file    Minutes per session: Not on file  . Stress: Not on file  Relationships  . Social connections:    Talks on phone: Not on file  Gets together: Not on file    Attends religious service: Not on file    Active member of club or organization: Not on file    Attends meetings of clubs or organizations: Not on file    Relationship status: Not on file  Other Topics Concern  . Not on file  Social History Narrative  . Not on file    Hospital Course:    Mr. Mitton is a 32 year old male with a history of depression, mood instability, polysubstance dependence admitted after accidental overdose on a "bad batch" of heroine. The patient adamantly denies any thoughts, intention or plans to hurt himself or others. He is not interested in pharmacotherapy for depression or any substance abuse treatment, outpatient or residential. Last night, I spoke with his father who was extremely disrespectful and rude demanding that the patient is discharged with him immediately.    Major depressive disorder, recurrent versus substance-induced mood disorder: -The patient was offered Celexa 20 mg p.o. daily for depression but is refusing the  medication -He denies any current active or passive suicidal thoughts and continues to minimize substance use and overdose.  Opioid use disorder, severe, Amphetamine use disorder, severe, Alcohol use disorder, mild, Cannabis use disorder, severe, Cocaine use disorder in remission:  -The patient was advised to abstain from all illicit drugs and alcohol as it may worsen mood symptoms.  -The patient currently is on probation and has legal charges. He has already had positive drug screens in the past.  -It was recommended that he gos to residential substance abuse treatment but he is resistant .  -He refuses Suboxone treatment as well  Due to IVDU will check HIV.  It is unclear whether or not he is positive for hepatitis C.  Past lab values are negative for hepatitis C but the patient says he has hepatitis. -HepC and HIV pending  Tobacco use disorder:  -He was offered a nicotine patch.  -He was educated about negative consequences of tobacco and health  Disposition:  -Discharge to home with family -refuses residential treatment  -follow up with [facility] or [facility] for substance abuse when ready  -He is at high risk of overdose/death with current substance use -Rx for narcan given   Physical Findings: AIMS:  , ,  ,  ,    CIWA:    COWS:     Musculoskeletal: Strength & Muscle Tone: within normal limits Gait & Station: normal Patient leans: N/A  Psychiatric Specialty Exam: Physical Exam  Nursing note and vitals reviewed. Psychiatric: He has a normal mood and affect. His speech is normal and behavior is normal. Thought content normal. Cognition and memory are normal. He expresses impulsivity.    Review of Systems  Neurological: Negative.   Psychiatric/Behavioral: Positive for substance abuse.  All other systems reviewed and are negative.   Blood pressure 127/74, pulse 79, temperature 98.1 F (36.7 C), temperature source Oral, resp. rate 11, height 5\' 8"  (1.727 m), weight 77.1  kg, SpO2 100 %.Body mass index is 25.85 kg/m.  General Appearance: Casual  Eye Contact:  Good  Speech:  Clear and Coherent  Volume:  Normal  Mood:  Euthymic  Affect:  Appropriate  Thought Process:  Goal Directed and Descriptions of Associations: Intact  Orientation:  Full (Time, Place, and Person)  Thought Content:  WDL  Suicidal Thoughts:  No  Homicidal Thoughts:  No  Memory:  Immediate;   Fair Recent;   Fair Remote;   Fair  Judgement:  Poor  Insight:  Lacking  Psychomotor Activity:  Normal  Concentration:  Concentration: Fair and Attention Span: Fair  Recall:  [employer] of Knowledge:  Fair  Language:  Fair  Akathisia:  No  Handed:  Right  AIMS (if indicated):     Assets:  Communication Skills Desire for Improvement Housing Physical Health Resilience Social Support  ADL's:  Intact  Cognition:  WNL  Sleep:  Number of Hours: 6.45     Have you used any form of tobacco in the last 30 days? (Cigarettes, Smokeless Tobacco, Cigars, and/or Pipes): Yes  Has this patient used any form of tobacco in the last 30 days? (Cigarettes, Smokeless Tobacco, Cigars, and/or Pipes) Yes, Yes, A prescription for an FDA-approved tobacco cessation medication was offered at discharge and the patient refused  Blood Alcohol level:  Lab Results  Component Value Date   [facility] <10 08/10/2018   ETH <10 08/01/2018    Metabolic Disorder Labs:  Lab Results  Component Value Date   HGBA1C 5.8 01/21/2014   No results found for: PROLACTIN Lab Results  Component Value Date   TRIG 124 02/11/2014    See Psychiatric Specialty Exam and Suicide Risk Assessment completed by Attending Physician prior to discharge.  Discharge destination:  Home  Is patient on multiple antipsychotic therapies at discharge:  No   Has Patient had three or more failed trials of antipsychotic monotherapy by history:  No  Recommended Plan for Multiple Antipsychotic Therapies: NA  Discharge Instructions    Diet - low  sodium heart healthy   Complete by:  As directed    Increase activity slowly   Complete by:  As directed      Allergies as of 08/13/2018      Reactions   Vicodin [hydrocodone-acetaminophen] Other (See Comments)   Reaction:  GI upset       Medication List    STOP taking these medications   citalopram 20 MG tablet Commonly known as:  CELEXA        Follow-up recommendations:  Activity:  as tolerated Diet:  low sodium heart healthy Other:  keep follow up appointments  Comments:     Signed: Kristine Linea, MD 08/13/2018, 9:18 AM

## 2018-08-14 LAB — HIV ANTIBODY (ROUTINE TESTING W REFLEX): HIV SCREEN 4TH GENERATION: NONREACTIVE

## 2018-08-26 ENCOUNTER — Other Ambulatory Visit: Payer: Self-pay

## 2018-08-26 ENCOUNTER — Emergency Department
Admission: EM | Admit: 2018-08-26 | Discharge: 2018-08-27 | Disposition: A | Discharge status: Other Acute Inpatient Hospital | Payer: Self-pay | Attending: Emergency Medicine | Admitting: Emergency Medicine

## 2018-08-26 ENCOUNTER — Encounter: Payer: Self-pay | Admitting: Emergency Medicine

## 2018-08-26 DIAGNOSIS — R45851 Suicidal ideations: Secondary | ICD-10-CM | POA: Insufficient documentation

## 2018-08-26 DIAGNOSIS — F329 Major depressive disorder, single episode, unspecified: Secondary | ICD-10-CM | POA: Insufficient documentation

## 2018-08-26 DIAGNOSIS — F1510 Other stimulant abuse, uncomplicated: Secondary | ICD-10-CM | POA: Diagnosis present

## 2018-08-26 DIAGNOSIS — F17200 Nicotine dependence, unspecified, uncomplicated: Secondary | ICD-10-CM | POA: Diagnosis present

## 2018-08-26 DIAGNOSIS — Y9389 Activity, other specified: Secondary | ICD-10-CM | POA: Insufficient documentation

## 2018-08-26 DIAGNOSIS — F332 Major depressive disorder, recurrent severe without psychotic features: Secondary | ICD-10-CM | POA: Diagnosis present

## 2018-08-26 DIAGNOSIS — Z046 Encounter for general psychiatric examination, requested by authority: Secondary | ICD-10-CM | POA: Insufficient documentation

## 2018-08-26 DIAGNOSIS — X838XXA Intentional self-harm by other specified means, initial encounter: Secondary | ICD-10-CM | POA: Insufficient documentation

## 2018-08-26 DIAGNOSIS — Y999 Unspecified external cause status: Secondary | ICD-10-CM | POA: Insufficient documentation

## 2018-08-26 DIAGNOSIS — T1491XA Suicide attempt, initial encounter: Secondary | ICD-10-CM | POA: Insufficient documentation

## 2018-08-26 DIAGNOSIS — F17210 Nicotine dependence, cigarettes, uncomplicated: Secondary | ICD-10-CM | POA: Insufficient documentation

## 2018-08-26 DIAGNOSIS — S199XXA Unspecified injury of neck, initial encounter: Secondary | ICD-10-CM | POA: Insufficient documentation

## 2018-08-26 DIAGNOSIS — F1220 Cannabis dependence, uncomplicated: Secondary | ICD-10-CM | POA: Diagnosis present

## 2018-08-26 DIAGNOSIS — F1120 Opioid dependence, uncomplicated: Secondary | ICD-10-CM | POA: Diagnosis present

## 2018-08-26 DIAGNOSIS — Y9289 Other specified places as the place of occurrence of the external cause: Secondary | ICD-10-CM | POA: Insufficient documentation

## 2018-08-26 LAB — COMPREHENSIVE METABOLIC PANEL
ALK PHOS: 47 U/L (ref 38–126)
ALT: 19 U/L (ref 0–44)
AST: 28 U/L (ref 15–41)
Albumin: 4.8 g/dL (ref 3.5–5.0)
Anion gap: 10 (ref 5–15)
BUN: 18 mg/dL (ref 6–20)
CO2: 26 mmol/L (ref 22–32)
CREATININE: 1.06 mg/dL (ref 0.61–1.24)
Calcium: 9.8 mg/dL (ref 8.9–10.3)
Chloride: 104 mmol/L (ref 98–111)
GFR calc Af Amer: >60 mL/min (ref >60)
GFR calc non Af Amer: >60 mL/min (ref >60)
GLUCOSE: 130 mg/dL — AB (ref 70–99)
Potassium: 3.7 mmol/L (ref 3.5–5.1)
SODIUM: 140 mmol/L (ref 135–145)
Total Bilirubin: 1.2 mg/dL (ref 0.3–1.2)
Total Protein: 8 g/dL (ref 6.5–8.1)

## 2018-08-26 LAB — CBC
HEMATOCRIT: 41.6 % (ref 39.0–52.0)
Hemoglobin: 14.6 g/dL (ref 13.0–17.0)
MCH: 31.8 pg (ref 26.0–34.0)
MCHC: 35.1 g/dL (ref 30.0–36.0)
MCV: 90.6 fL (ref 80.0–100.0)
Platelets: 236 10*3/uL (ref 150–400)
RBC: 4.59 MIL/uL (ref 4.22–5.81)
RDW: 12.9 % (ref 11.5–15.5)
WBC: 9.6 10*3/uL (ref 4.0–10.5)
nRBC: 0 % (ref 0.0–0.2)

## 2018-08-26 LAB — ETHANOL: Alcohol, Ethyl (B): <10 mg/dL (ref <10)

## 2018-08-26 LAB — ACETAMINOPHEN LEVEL: Acetaminophen (Tylenol), Serum: <10 ug/mL — ABNORMAL LOW (ref 10–30)

## 2018-08-26 LAB — SALICYLATE LEVEL: Salicylate Lvl: <7 mg/dL (ref 2.8–30.0)

## 2018-08-26 NOTE — ED Notes (Signed)
Hourly rounding reveals patient in hall 20. No complaints, stable, in no acute distress. Q15 minute rounds and monitoring via Rover and Officer to continue.  

## 2018-08-26 NOTE — ED Triage Notes (Signed)
Pt brought in by police - attempted to hang himself today

## 2018-08-26 NOTE — ED Notes (Signed)
Pt. Transferred from Triage to room after dressing out and screening for contraband. Report to include Situation, Background, Assessment and Recommendations from [facility]. Pt. Oriented to Quad including Q15 minute rounds as well as Psychologist, counselling for their protection. Patient is alert and oriented, warm and dry in no acute distress. Patient denies SI, HI, and AVH. Pt. Encouraged to let me know if needs arise.

## 2018-08-26 NOTE — ED Provider Notes (Signed)
[facility] Emergency Department Provider Note   ____________________________________________   First MD Initiated Contact with Patient 08/26/18 2131     (approximate)  I have reviewed the triage vital signs and the nursing notes.   HISTORY  Chief Complaint Psychiatric Evaluation (ivc'd)    HPI Edsel Shives is a 32 y.o. male who is using methamphetamines and a number of other illicit drugs who hung himself from a tree today with a steel cable trying to kill himself.  His father found him to have the weight off and on the police came he ran away and had to be caught.  He was trying to kill himself.  Past Medical History:  Diagnosis Date  . Drug overdose   . DVT (deep venous thrombosis) (HCC)   . Hepatitis C     Patient Active Problem List   Diagnosis Date Noted  . Cannabis abuse   . Major depressive disorder, recurrent episode, severe (HCC) 08/11/2018  . Methamphetamine abuse (HCC)   . Cannabis use disorder, moderate, dependence (HCC)   . Tobacco use disorder 01/28/2016  . 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 Uncomplicated opioid dependence (HCC)   . Opiate or related narcotic overdose (HCC) 11/28/2015  . Substance abuse [facility])     Past Surgical History:  Procedure Laterality Date  . DENTAL SURGERY      Prior to Admission medications   Medication Sig Start Date End Date Taking? Authorizing Provider  naloxone [facility]) nasal spray 4 mg/0.1 mL Use as directed 08/13/18   Pucilowska, Ellin Goodie, MD    Allergies Vicodin [hydrocodone-acetaminophen]  History reviewed. No pertinent family history.  Social History Social History   Tobacco Use  . Smoking status: Current Every Day Smoker    Packs/day: 1.00    Years: 20.00    Pack years: 20.00    Types: Cigarettes  . Smokeless tobacco: Never Used  Substance Use Topics  . Alcohol use: Yes    Comment: beer and liquor, binges  . Drug use:  Yes    Frequency: 1.0 times per week    Types: Marijuana, IV, Cocaine, Methamphetamines    Comment: heroin    Review of Systems  Constitutional: No fever/chills Eyes: No visual changes. ENT: sore throat. Cardiovascular: Denies chest pain. Respiratory: Denies shortness of breath. Gastrointestinal: No abdominal pain.  No nausea, no vomiting.  No diarrhea.  No constipation. Genitourinary: Negative for dysuria. Musculoskeletal: Negative for back pain. Skin: Negative for rash. Neurological: Negative for headaches, focal weakness   ____________________________________________   PHYSICAL EXAM:  VITAL SIGNS: ED Triage Vitals  Enc Vitals Group     BP 08/26/18 2049 (!) 134/96     Pulse Rate 08/26/18 2049 (!) 107     Resp 08/26/18 2049 16     Temp 08/26/18 2049 98.1 F (36.7 C)     Temp src --      SpO2 08/26/18 2049 100 %     Weight 08/26/18 2050 160 lb (72.6 kg)     Height 08/26/18 2050 5\' 8"  (1.727 m)     Head Circumference --      Peak Flow --      Pain Score 08/26/18 2050 4     Pain Loc --      Pain Edu? --      Excl. in GC? --     Constitutional: Alert and oriented. Well appearing and in no acute distress. Eyes: Conjunctivae are normal. PERRL. EOMI.  Head: Atraumatic. Nose: No congestion/rhinnorhea. Mouth/Throat: Mucous membranes are moist.  Oropharynx non-erythematous. Neck: No stridor.  Ligature marks high on neck no tenderness over posterior neck over the spine.  Full painless range of motion of the neck cardiovascular: Normal rate, regular rhythm. Grossly normal heart sounds.  Good peripheral circulation. Respiratory: Normal respiratory effort.  No retractions. Lungs CTAB. Gastrointestinal: Soft and nontender. No distention. No abdominal bruits. No CVA tenderness. Musculoskeletal: No lower extremity tenderness nor edema.  No joint effusions. Neurologic:  Normal speech and language. No gross focal neurologic deficits are appreciated.  Skin:  Skin is warm, dry and  intact. No rash noted. Psychiatric: Mood and affect are normal. Speech and behavior are normal.  ____________________________________________   LABS (all labs ordered are listed, but only abnormal results are displayed)  Labs Reviewed  COMPREHENSIVE METABOLIC PANEL - Abnormal; Notable for the following components:      Result Value   Glucose, Bld 130 (*)    All other components within normal limits  ACETAMINOPHEN LEVEL - Abnormal; Notable for the following components:   Acetaminophen (Tylenol), Serum <10 (*)    All other components within normal limits  ETHANOL  CBC  SALICYLATE LEVEL  URINE DRUG SCREEN, QUALITATIVE ([facility] ONLY)  URINALYSIS, COMPLETE (UACMP) WITH MICROSCOPIC   ____________________________________________  EKG   ____________________________________________  RADIOLOGY  ED MD interpretation:    Official radiology report(s): Ct Angio Neck W And/or Wo Contrast  Result Date: 08/27/2018 CLINICAL DATA:  32 y/o  M; attempted self hanging. EXAM: CT ANGIOGRAPHY NECK TECHNIQUE: Multidetector CT imaging of the neck was performed using the standard protocol during bolus administration of intravenous contrast. Multiplanar CT image reconstructions and MIPs were obtained to evaluate the vascular anatomy. Carotid stenosis measurements (when applicable) are obtained utilizing NASCET criteria, using the distal internal carotid diameter as the denominator. CONTRAST:  75mL OMNIPAQUE IOHEXOL 350 MG/ML SOLN COMPARISON:  None. FINDINGS: Aortic arch: Standard branching. Imaged portion shows no evidence of aneurysm or dissection. No significant stenosis of the major arch vessel origins. Right carotid system: No evidence of dissection, stenosis (50% or greater) or occlusion. Left carotid system: No evidence of dissection, stenosis (50% or greater) or occlusion. Vertebral arteries: Codominant. No evidence of dissection, stenosis (50% or greater) or occlusion. Skeleton: Negative. Other neck:  Dental caries within the molars. No hematoma, displaced laryngeal cartilage fracture, or hyoid fracture. Upper chest: Negative. IMPRESSION: 1. No acute vascular injury, cervical spine fracture, or airway injury identified. 2. Dental caries. Electronically Signed   By: Mitzi Hansen M.D.   On: 08/27/2018 00:51    ____________________________________________   PROCEDURES  Procedure(s) performed:   Procedures  Critical Care performed: ____________________________________________   INITIAL IMPRESSION / ASSESSMENT AND PLAN / ED COURSE  CT Angie of the head done reviewed by myself and Dr. Lamont Snowball we do not see anything.  If the radiologist agrees we will plan on admitting this gentleman at least to the [facility]     Clinical Course as of Aug 27 53  Sun Aug 26, 2018  2246 CT Soft Tissue Neck Wo Contrast [PM]    Clinical Course User Index [PM] Arnaldo Natal, MD     ____________________________________________   FINAL CLINICAL IMPRESSION(S) / ED DIAGNOSES  Final diagnoses:  Suicide attempt [facility])     ED Discharge Orders    None       Note:  This document was prepared using Dragon voice recognition software and may include unintentional dictation errors.    Dorothea Glassman  F, MD 08/27/18 [phone]

## 2018-08-26 NOTE — ED Notes (Signed)
Patient again reminded we need a urine sample.

## 2018-08-26 NOTE — ED Notes (Signed)
Per police pt was found by his father hanging in a tree and got him down and called police. Pt took off and police found him. ivc'd by police

## 2018-08-26 NOTE — ED Notes (Signed)
Hourly rounding reveals patient in hall bed. No complaints, stable, in no acute distress. Q15 minute rounds and monitoring via Security Cameras to continue. 

## 2018-08-26 NOTE — ED Notes (Signed)
Patient reminded that we need a urine sample.

## 2018-08-27 ENCOUNTER — Inpatient Hospital Stay
Admission: AD | Admit: 2018-08-27 | Discharge: 2018-08-31 | DRG: 885 | Disposition: A | Discharge status: Home / Self Care | Payer: No Typology Code available for payment source | Source: Intra-hospital | Attending: Psychiatry | Admitting: Psychiatry

## 2018-08-27 ENCOUNTER — Other Ambulatory Visit: Payer: Self-pay

## 2018-08-27 ENCOUNTER — Emergency Department: Payer: Self-pay

## 2018-08-27 ENCOUNTER — Encounter: Payer: Self-pay | Admitting: Psychiatry

## 2018-08-27 DIAGNOSIS — F1120 Opioid dependence, uncomplicated: Secondary | ICD-10-CM | POA: Diagnosis present

## 2018-08-27 DIAGNOSIS — Z8619 Personal history of other infectious and parasitic diseases: Secondary | ICD-10-CM | POA: Diagnosis not present

## 2018-08-27 DIAGNOSIS — Z885 Allergy status to narcotic agent status: Secondary | ICD-10-CM | POA: Diagnosis not present

## 2018-08-27 DIAGNOSIS — F332 Major depressive disorder, recurrent severe without psychotic features: Secondary | ICD-10-CM | POA: Diagnosis present

## 2018-08-27 DIAGNOSIS — Z915 Personal history of self-harm: Secondary | ICD-10-CM

## 2018-08-27 DIAGNOSIS — F17210 Nicotine dependence, cigarettes, uncomplicated: Secondary | ICD-10-CM | POA: Diagnosis present

## 2018-08-27 DIAGNOSIS — F1510 Other stimulant abuse, uncomplicated: Secondary | ICD-10-CM | POA: Diagnosis present

## 2018-08-27 DIAGNOSIS — F1123 Opioid dependence with withdrawal: Secondary | ICD-10-CM | POA: Diagnosis present

## 2018-08-27 DIAGNOSIS — Z86718 Personal history of other venous thrombosis and embolism: Secondary | ICD-10-CM

## 2018-08-27 DIAGNOSIS — F17200 Nicotine dependence, unspecified, uncomplicated: Secondary | ICD-10-CM | POA: Diagnosis present

## 2018-08-27 DIAGNOSIS — Z653 Problems related to other legal circumstances: Secondary | ICD-10-CM

## 2018-08-27 DIAGNOSIS — F1220 Cannabis dependence, uncomplicated: Secondary | ICD-10-CM | POA: Diagnosis present

## 2018-08-27 MED ORDER — ALUM & MAG HYDROXIDE-SIMETH 200-200-20 MG/5ML PO SUSP: 30.0000 mL | ORAL | Status: DC | PRN

## 2018-08-27 MED ORDER — CLONIDINE HCL 0.1 MG PO TABS
0.1000 mg | ORAL_TABLET | Freq: Two times a day (BID) | ORAL | Status: DC
  Administered 2018-08-27 – 2018-08-28 (×2): 0.1 mg via ORAL
  Filled 2018-08-27 (×4): qty 1

## 2018-08-27 MED ORDER — NICOTINE 21 MG/24HR TD PT24
21.0000 mg | MEDICATED_PATCH | Freq: Once | TRANSDERMAL | Status: DC
  Administered 2018-08-27: 21 mg via TRANSDERMAL
  Filled 2018-08-27: qty 1

## 2018-08-27 MED ORDER — MAGNESIUM HYDROXIDE 400 MG/5ML PO SUSP: 30.0000 mL | Freq: Every day | ORAL | Status: DC | PRN

## 2018-08-27 MED ORDER — CLONIDINE HCL 0.1 MG PO TABS
0.1000 mg | ORAL_TABLET | Freq: Two times a day (BID) | ORAL | Status: DC
  Administered 2018-08-27: 0.1 mg via ORAL
  Filled 2018-08-27: qty 1

## 2018-08-27 MED ORDER — TRAZODONE HCL 100 MG PO TABS
100.0000 mg | ORAL_TABLET | Freq: Every evening | ORAL | Status: DC | PRN
  Administered 2018-08-29 – 2018-08-30 (×2): 100 mg via ORAL
  Filled 2018-08-27: qty 1

## 2018-08-27 MED ORDER — NICOTINE 21 MG/24HR TD PT24
21.0000 mg | MEDICATED_PATCH | Freq: Once | TRANSDERMAL | Status: DC
  Administered 2018-08-28: 21 mg via TRANSDERMAL
  Filled 2018-08-27: qty 1

## 2018-08-27 MED ORDER — IOHEXOL 350 MG/ML SOLN
75.0000 mL | Freq: Once | INTRAVENOUS | Status: AC | PRN
  Administered 2018-08-27: 75 mL via INTRAVENOUS

## 2018-08-27 NOTE — BH Assessment (Signed)
Patient is to be admitted to [facility] by Dr. Jennet Maduro.  Attending Physician will be Dr. Jennet Maduro.   Patient has been assigned to room 315, by [facility] Charge Nurse Gwen.   Intake Paper Work has been signed and placed on patient chart.  ER staff is aware of the admission:  Misty Stanley, ER Secretary    Dr. Lenard Lance, ER MD   Geralynn Ochs, Patient's Nurse   Dedra Skeens, Patient Access

## 2018-08-27 NOTE — ED Notes (Signed)
Hep Lock removed at patients request with cath intact.

## 2018-08-27 NOTE — ED Notes (Signed)
Patient talking to TTS 

## 2018-08-27 NOTE — ED Notes (Signed)
Plan to admit to [facility]U

## 2018-08-27 NOTE — ED Notes (Signed)
Hourly rounding reveals patient in room. No complaints, stable, in no acute distress. Q15 minute rounds and monitoring via Rover and Officer to continue.   

## 2018-08-27 NOTE — ED Notes (Signed)
Patient appropriate and cooperative. NAD noted. Patient has been in his room watching television

## 2018-08-27 NOTE — ED Provider Notes (Signed)
-----------------------------------------   8:17 AM on 08/27/2018 -----------------------------------------  Patient has been seen by psychiatry they believe the patient meets criteria for IVC and inpatient treatment.  We will maintain the IVC and the patient will be admitted to the psychiatry service once a bed is available.  Work-up largely nonrevealing including a negative CT angiography of the neck.   Minna Antis, MD 08/27/18 [phone]

## 2018-08-27 NOTE — Tx Team (Signed)
Initial Treatment Plan 08/27/2018 9:23 PM Edwin [name] [id number]    PATIENT STRESSORS: Medication change or noncompliance Substance abuse Other: Depression   PATIENT STRENGTHS: Ability for insight Capable of independent living Communication skills Supportive family/friends   PATIENT IDENTIFIED PROBLEMS: Suicide   Depression  Anxiety  Self harm               DISCHARGE CRITERIA:  Improved stabilization in mood, thinking, and/or behavior Motivation to continue treatment in a less acute level of care Need for constant or close observation no longer present Reduction of life-threatening or endangering symptoms to within safe limits  PRELIMINARY DISCHARGE PLAN: Outpatient therapy Return to previous living arrangement Return to previous work or school arrangements  PATIENT/FAMILY INVOLVEMENT: This treatment plan has been presented to and reviewed with the patient, Edwin [name].  The patient has been given the opportunity to ask questions and make suggestions.  Elmyra Ricks, RN 08/27/2018, 9:23 PM

## 2018-08-27 NOTE — ED Notes (Signed)
Hourly rounding reveals patient sleeping in room. No complaints, stable, in no acute distress. Q15 minute rounds and monitoring via Rover and Officer to continue.  

## 2018-08-27 NOTE — ED Notes (Signed)
Hourly rounding reveals patient in room. No complaints, stable, in no acute distress. Q15 minute rounds and monitoring via Security Cameras to continue. 

## 2018-08-27 NOTE — ED Notes (Signed)
Patient assigned to appropriate care area   Introduced self to pt  Patient oriented to unit/care area: Informed that, for their safety, care areas are designed for safety and visiting and phone hours explained to patient. Patient verbalizes understanding, and verbal contract for safety obtained  Environment secured  

## 2018-08-27 NOTE — Consult Note (Signed)
Will admit to psychiatry. Orders entered. Thank you.

## 2018-08-27 NOTE — Progress Notes (Signed)
D: Received patient from [facility] Emergency Department. Patient skin assessment completed with Aurther Loft RN, skin is intact, no contraband found. Patient has bruising on the anterior middle and bilateral parts of the neck from reported "attempt at suicide by hanging myself." Pt. Was admitted under the services of [place].   Patient upon arrival to the unit and during assessment was calm, pleasant and cooperative. Patient denied SI/HI/AVH, verbally was able to contract for safety. Patient stated his pain was a 7/10 in his neck from the suicide attempt. Patient denied any medications for the neck pain. Patient states "my anxiety is low, I can't put a number on it but I'm chill." Patient stated that his reason for being here as "I tried to hang myself." When assessed about why the patient stated "It's all because of my depression, I feel if I can get my depression under control then I won't feel the need to do drugs and make dumbass decisions."     A: Patient oriented to unit/room/call light.Patient was encourage to participate in unit activities and continue with plan of care. Q x 15 minute observation checks were initiated for safety.     R: Patient is receptive to treatment and safety plan put into place with patients direct input. Patient will comminuted to be monitored.

## 2018-08-27 NOTE — ED Notes (Signed)
SOC  DONE  REPORT  GIVEN  TO MD 

## 2018-08-27 NOTE — ED Notes (Signed)
Patient's grandmother Edwin [name] called to ask if we could give the family some information on a rehabilitation treatment program, the family also wants to know when patient will be discharged so they can send him straight to a rehabilitation place. Patient is refusing to go per grandmother

## 2018-08-27 NOTE — ED Notes (Signed)
Patient visiting with girlfriend. 

## 2018-08-27 NOTE — BH Assessment (Signed)
Assessment Note  Edwin [name] is an 32 y.o. male. Edwin [name] arrived to the ED by way of law enforcement.  He reports that he got hurt working on a tree cutting some branches.  He states it was a freak accident.  He denied symptoms of depression, He denied symptoms of anxiety.  He denied having auditory or visual hallucinations.   He denied suicidal ideation or intent. He denied homicidal ideation or intent.  He reports that he has used "a full cocktail over the weekend" when asked about drug use.  He states that he took Suboxone at 4 pm on Sunday.  He reports that his visit to the ED is stressful as he does not know what to expect.  IVC paperwork reports, "Respondent attempted to hang himself unsuccessfully. He is a danger to himself and others.   Edwin [name] appeared to not be forthright when providing information.  He provided contradictory information than what was previously stated.  He was agitated and excessively talkative.  He has been pacing in his room, doing pushups, and other exercises.  He has been active from the time of arrival.  He has been intrusive with other patients and staff.  He is unfocused and covers multiple topics in short amounts of time.  Diagnosis: Substance Use disorder,   Past Medical History:  Past Medical History:  Diagnosis Date  . Drug overdose   . DVT (deep venous thrombosis) (HCC)   . Hepatitis C     Past Surgical History:  Procedure Laterality Date  . DENTAL SURGERY      Family History: History reviewed. No pertinent family history.  Social History:  reports that he has been smoking cigarettes. He has a 20.00 pack-year smoking history. He has never used smokeless tobacco. He reports that he drinks alcohol. He reports that he has current or past drug history. Drugs: Marijuana, IV, Cocaine, and Methamphetamines. Frequency: 1.00 time per week.  Additional Social History:  Alcohol / Drug Use History of alcohol / drug use?: Yes Substance #1 Name  of Substance 1: marijuana 1 - Age of First Use: 11 1 - Amount (size/oz): gram 1 - Frequency: twice weekly 1 - Last Use / Amount: 08/26/2018 Substance #2 Name of Substance 2: heroin 2 - Age of First Use: 26 2 - Amount (size/oz): 0.2 gram 2 - Frequency: once weekly 2 - Last Use / Amount: 08/26/2018 Substance #3 Name of Substance 3: methamphetamine 3 - Age of First Use: 31 3 - Amount (size/oz): 0.2 3 - Frequency: not much 3 - Last Use / Amount: 08/26/2018 Substance #4 Name of Substance 4: Klonipine 4 - Age of First Use: 11 4 - Amount (size/oz): 2 mg 4 - Frequency: 1-2 times month 4 - Last Use / Amount: 08/26/2018  CIWA: CIWA-Ar BP: (!) 134/96 Pulse Rate: (!) 107 Nausea and Vomiting: no nausea and no vomiting Tactile Disturbances: none Tremor: no tremor Auditory Disturbances: not present Paroxysmal Sweats: no sweat visible Visual Disturbances: not present Anxiety: mildly anxious Headache, Fullness in Head: none present Agitation: normal activity Orientation and Clouding of Sensorium: oriented and can do serial additions CIWA-Ar Total: 1 COWS:    Allergies:  Allergies  Allergen Reactions  . Vicodin [Hydrocodone-Acetaminophen] Other (See Comments)    Reaction:  GI upset     Home Medications:  (Not in a hospital admission)  OB/GYN Status:  No LMP for male patient.  General Assessment Data TTS Assessment: In system Is this a Tele or Face-to-Face Assessment?: Face-to-Face  Is this an Initial Assessment or a Re-assessment for this encounter?: Initial Assessment Patient Accompanied by:: N/A Language Other than English: No Living Arrangements: Other (Comment)(Private home) What gender do you identify as?: Male Marital status: Single Pregnancy Status: No Living Arrangements: Parent Can pt return to current living arrangement?: Yes Admission Status: Involuntary Petitioner: Police Is patient capable of signing voluntary admission?: No Referral Source:  Self/Family/Friend Insurance type: none  Medical Screening Exam [facility] Walk-in ONLY) Medical Exam completed: Yes  Crisis Care Plan Living Arrangements: Parent Legal Guardian: Other:(Self) Name of Psychiatrist: None Name of Therapist: None  Education Status Is patient currently in school?: No Highest grade of school patient has completed: GED Is the patient employed, unemployed or receiving disability?: Unemployed  Risk to self with the past 6 months Suicidal Ideation: No Has patient been a risk to self within the past 6 months prior to admission? : No Suicidal Intent: No Has patient had any suicidal intent within the past 6 months prior to admission? : No Is patient at risk for suicide?: No Suicidal Plan?: No Has patient had any suicidal plan within the past 6 months prior to admission? : Yes Access to Means: Yes Specify Access to Suicidal Means: Attempted to hang himself What has been your use of drugs/alcohol within the last 12 months?: use of multiple substances Previous Attempts/Gestures: No(Patieint denied) How many times?: 0 Other Self Harm Risks: denied Triggers for Past Attempts: Unknown Intentional Self Injurious Behavior: None Family Suicide History: No Recent stressful life event(s): (denied) Persecutory voices/beliefs?: No Depression: No Depression Symptoms: (denied by patient) Substance abuse history and/or treatment for substance abuse?: Yes Suicide prevention information given to non-admitted patients: Not applicable  Risk to Others within the past 6 months Homicidal Ideation: No Does patient have any lifetime risk of violence toward others beyond the six months prior to admission? : No Thoughts of Harm to Others: No Current Homicidal Intent: No Current Homicidal Plan: No Access to Homicidal Means: No Identified Victim: None identified History of harm to others?: No Assessment of Violence: None Noted Does patient have access to weapons?: Yes  (Comment)(Carries a pocket knife) Criminal Charges Pending?: Yes Describe Pending Criminal Charges: Drug parephanalia, driving w/o license Does patient have a court date: Yes Court Date: 08/30/18 Is patient on probation?: No  Psychosis Hallucinations: None noted Delusions: Grandiose  Mental Status Report Appearance/Hygiene: In scrubs Eye Contact: Good Motor Activity: Restlessness Speech: Logical/coherent Level of Consciousness: Alert Mood: Anxious Affect: Appropriate to circumstance Anxiety Level: Moderate Thought Processes: Flight of Ideas Judgement: Partial Orientation: Appropriate for developmental age Obsessive Compulsive Thoughts/Behaviors: None  Cognitive Functioning Concentration: Fair Memory: Recent Intact Is patient IDD: No Insight: Poor Impulse Control: Poor Appetite: Good Sleep: Decreased Vegetative Symptoms: None  ADLScreening [facility] Assessment Services) Patient's cognitive ability adequate to safely complete daily activities?: Yes Patient able to express need for assistance with ADLs?: Yes Independently performs ADLs?: Yes (appropriate for developmental age)  Prior Inpatient Therapy Prior Inpatient Therapy: Yes Prior Therapy Dates: October 2019 Prior Therapy Facilty/Provider(s): [facility] Reason for Treatment: Depression, Substance abuse  Prior Outpatient Therapy Prior Outpatient Therapy: No Does patient have an ACCT team?: No Does patient have Intensive In-House Services?  : No Does patient have [facility] services? : No Does patient have P4CC services?: No  ADL Screening (condition at time of admission) Patient's cognitive ability adequate to safely complete daily activities?: Yes Is the patient deaf or have difficulty hearing?: No Does the patient have difficulty seeing, even when wearing glasses/contacts?: No  Does the patient have difficulty concentrating, remembering, or making decisions?: No Patient able to express need for assistance with ADLs?:  Yes Does the patient have difficulty dressing or bathing?: No Independently performs ADLs?: Yes (appropriate for developmental age) Does the patient have difficulty walking or climbing stairs?: No Weakness of Legs: None Weakness of Arms/Hands: None  Home Assistive Devices/Equipment Home Assistive Devices/Equipment: None    Abuse/Neglect Assessment (Assessment to be complete while patient is alone) Abuse/Neglect Assessment Can Be Completed: (Patient denied a hisotry of abuse)     Advance Directives (For Healthcare) Does Patient Have a Medical Advance Directive?: No Would patient like information on creating a medical advance directive?: No - Patient declined          Disposition:  Disposition Initial Assessment Completed for this Encounter: Yes  On Site Evaluation by:   Reviewed with Physician:    Justice Deeds 08/27/2018 4:44 AM

## 2018-08-27 NOTE — Plan of Care (Signed)
Patient newly admitted to the unit. Patient hasn't been on the unit long enough to progress.   Problem: Education: Goal: Utilization of techniques to improve thought processes will improve Outcome: Not Progressing Goal: Knowledge of the prescribed therapeutic regimen will improve Outcome: Not Progressing   Problem: Coping: Goal: Coping ability will improve Outcome: Not Progressing Goal: Will verbalize feelings Outcome: Not Progressing   Problem: Safety: Goal: Ability to disclose and discuss suicidal ideas will improve Outcome: Not Progressing Goal: Ability to identify and utilize support systems that promote safety will improve Outcome: Not Progressing   Problem: Education: Goal: Ability to make informed decisions regarding treatment will improve Outcome: Not Progressing   Problem: Medication: Goal: Compliance with prescribed medication regimen will improve Outcome: Not Progressing   Problem: Self-Concept: Goal: Ability to disclose and discuss suicidal ideas will improve Outcome: Not Progressing Goal: Will verbalize positive feelings about self Outcome: Not Progressing

## 2018-08-28 DIAGNOSIS — F332 Major depressive disorder, recurrent severe without psychotic features: Principal | ICD-10-CM

## 2018-08-28 MED ORDER — VENLAFAXINE HCL ER 75 MG PO CP24
150.0000 mg | ORAL_CAPSULE | Freq: Every day | ORAL | Status: DC
  Administered 2018-08-28 – 2018-08-31 (×4): 150 mg via ORAL
  Filled 2018-08-28 (×4): qty 2

## 2018-08-28 MED ORDER — ARIPIPRAZOLE 10 MG PO TABS
10.0000 mg | ORAL_TABLET | Freq: Every day | ORAL | Status: DC
  Administered 2018-08-28 – 2018-08-31 (×4): 10 mg via ORAL
  Filled 2018-08-28 (×4): qty 1

## 2018-08-28 NOTE — Plan of Care (Signed)
Patient has the ability to improve his thought processes and has also verbalized understanding of and has been in compliance with his prescribed therapeutic regimen. Patient's questions/concerns have been addressed and answered at this time. Patient has the ability to cope and make informed decisions regarding his health-care. Patient denies SI/HI/AVH as well as any signs/symptoms of depression/anxiety to this Clinical research associate. Patient has been free from injury on the unit thus far and remains safe on the unit at this time.  Problem: Education: Goal: Utilization of techniques to improve thought processes will improve Outcome: Progressing Goal: Knowledge of the prescribed therapeutic regimen will improve Outcome: Progressing   Problem: Coping: Goal: Coping ability will improve Outcome: Progressing Goal: Will verbalize feelings Outcome: Progressing   Problem: Safety: Goal: Ability to disclose and discuss suicidal ideas will improve Outcome: Progressing Goal: Ability to identify and utilize support systems that promote safety will improve Outcome: Progressing   Problem: Education: Goal: Ability to make informed decisions regarding treatment will improve Outcome: Progressing   Problem: Medication: Goal: Compliance with prescribed medication regimen will improve Outcome: Progressing   Problem: Self-Concept: Goal: Ability to disclose and discuss suicidal ideas will improve Outcome: Progressing Goal: Will verbalize positive feelings about self Outcome: Progressing   Problem: Safety: Goal: Ability to remain free from injury will improve Outcome: Progressing

## 2018-08-28 NOTE — Progress Notes (Signed)
D- Patient alert and oriented. Patient presents in a pleasant mood on assessment stating that he slept good last night and all questions/concerns have been addressed and answered at this time. Patient denies SI, HI, AVH, and pain at this time. Patient also denies depression and anxiety to this Clinical research associate. Patient had no goals for today.  A- Scheduled medications administered to patient, per MD orders. Support and encouragement provided.  Routine safety checks conducted every 15 minutes.  Patient informed to notify staff with problems or concerns.  R- No adverse drug reactions noted. Patient contracts for safety at this time. Patient compliant with medications and treatment plan. Patient receptive, calm, and cooperative. Patient interacts well with others on the unit.  Patient remains safe at this time.

## 2018-08-28 NOTE — [facility] Suicide Risk Assessment (Signed)
[facility] INPATIENT:  Family/Significant Other Suicide Prevention Education  Suicide Prevention Education:  Patient Refusal for Family/Significant Other Suicide Prevention Education: The patient Edwin [name] has refused to provide written consent for family/significant other to be provided Family/Significant Other Suicide Prevention Education during admission and/or prior to discharge.  Physician notified.  Johny Shears 08/28/2018, 11:54 AM

## 2018-08-28 NOTE — Progress Notes (Signed)
Patient refused his Clonidine. This Clinical research associate will notify MD.

## 2018-08-28 NOTE — Progress Notes (Signed)
Recreation Therapy Notes   Date: 08/28/2018  Time: 9:30 pm   Location: Craft Room   Behavioral response: N/A   Intervention Topic: Happiness  Discussion/Intervention: Patient did not attend group.   Clinical Observations/Feedback:  Patient did not attend group.   Hayla Hinger LRT/CTRS        Nimrat Woolworth 08/28/2018 11:16 AM 

## 2018-08-28 NOTE — H&P (Signed)
Psychiatric Admission Assessment Adult  Patient Identification: Edwin [name] MRN:  [id number] Date of Evaluation:  08/28/2018 Chief Complaint:  MDD RECURRENT SEVERE Principal Diagnosis: Major depressive disorder, recurrent severe without psychotic features ([place]) Diagnosis:   Patient Active Problem List   Diagnosis Date Noted  . Major depressive disorder, recurrent severe without psychotic features ([place]) [F33.2]     Priority: High  . Cannabis abuse [F12.10]   . Major depressive disorder, recurrent episode, severe ([place]) [F33.2] 08/11/2018  . Methamphetamine abuse ([place]) [F15.10]   . Cannabis use disorder, moderate, dependence ([place]) [F12.20]   . Tobacco use disorder [F17.200] 01/28/2016  . Uncomplicated opioid dependence ([place]) [F11.20]   . Opiate or related narcotic overdose ([place]) [T40.601A] 11/28/2015  . Substance abuse ([place]) [F19.10]    History of Present Illness:   Edwin [name] is a 32 year old male with a history of mood instability and substance abuse.  Chief complaint. "I need help."  History of present illness. Information was obtained from the patient and the chart. Edwin [name] was brought to the ER after attempt to hung himself in the woods. His father found him and cut the rope. Apparently, since last discharge, the patient went to [place] trying to [place] substance abuse counselling. His girlfriend was accepted but he was directed to [facility] but never went. He continued to use substances, mostly Suboxone that he buys off the street. He uses whatever he can put his hands on. No UDS obtained at the time of admission. On the day of admission, his girlfriend who no longer lives with him, came to visit and refused to move back in with him until sober. He felt crushed and wanted to end his life. The patient never expressed any interest in treatment of depression or substance use. He now realized that depression plays a role and wants treatment. He reports poor sleep, decreased appetite,  anhedonia, feeling of guilt and hopelessness, crying spells, irritability. He denies suicidal ideation, rather, he acts on impuls. Denies psychotic symptoms. There are no anxiety symptoms.  Past psychiatric history. Two prior hospitalizations after unintentional heroin overdose. Depression started when his girlfriend and the mother of his children died of drug overdose. He has never been in residential treatment program. Edwin [name] to Suboxone clinic for a while.  Family psychiatric history. None reported.  Social history. Lives with his father and brother. Difficulties maintaining employment due to legal problems.   Total Time spent with patient: 1 hour  Is the patient at risk to self? Yes.    Has the patient been a risk to self in the past 6 months? No.  Has the patient been a risk to self within the distant past? Yes.    Is the patient a risk to others? No.  Has the patient been a risk to others in the past 6 months? No.  Has the patient been a risk to others within the distant past? No.   Prior Inpatient Therapy:   Prior Outpatient Therapy:    Alcohol Screening: 1. How often do you have a drink containing alcohol?: Monthly or less 2. How many drinks containing alcohol do you have on a typical day when you are drinking?: 1 or 2 3. How often do you have six or more drinks on one occasion?: Never AUDIT-C Score: 1 4. How often during the last year have you found that you were not able to stop drinking once you had started?: Never 5. How often during the last year have you failed to do what  was normally expected from you becasue of drinking?: Never 6. How often during the last year have you needed a first drink in the morning to get yourself going after a heavy drinking session?: Never 7. How often during the last year have you had a feeling of guilt of remorse after drinking?: Never 8. How often during the last year have you been unable to remember what happened the night before because you had  been drinking?: Never 9. Have you or someone else been injured as a result of your drinking?: No 10. Has a relative or friend or a doctor or another health worker been concerned about your drinking or suggested you cut down?: No Alcohol Use Disorder Identification Test Final Score (AUDIT): 1 Intervention/Follow-up: AUDIT Score <7 follow-up not indicated Substance Abuse History in the last 12 months:  Yes.   Consequences of Substance Abuse: Negative Previous Psychotropic Medications: Yes  Psychological Evaluations: No  Past Medical History:  Past Medical History:  Diagnosis Date  . Drug overdose   . DVT (deep venous thrombosis) ([place])   . Hepatitis C     Past Surgical History:  Procedure Laterality Date  . DENTAL SURGERY     Family History: History reviewed. No pertinent family history.  Tobacco Screening: Have you used any form of tobacco in the last 30 days? (Cigarettes, Smokeless Tobacco, Cigars, and/or Pipes): Yes Tobacco use, Select all that apply: 4 or less cigarettes per day Are you interested in Tobacco Cessation Medications?: Yes, will notify MD for an order Counseled patient on smoking cessation including recognizing danger situations, developing coping skills and basic information about quitting provided: Refused/Declined practical counseling Social History:  Social History   Substance and Sexual Activity  Alcohol Use Yes   Comment: beer and liquor, binges     Social History   Substance and Sexual Activity  Drug Use Yes  . Frequency: 1.0 times per week  . Types: Marijuana, IV, Cocaine, Methamphetamines   Comment: heroin    Additional Social History:                           Allergies:   Allergies  Allergen Reactions  . Vicodin [Hydrocodone-Acetaminophen] Other (See Comments)    Reaction:  GI upset    Lab Results:  Results for orders placed or performed during the hospital encounter of 08/26/18 (from the past 48 hour(s))  Comprehensive  metabolic panel     Status: Abnormal   Collection Time: 08/26/18  9:04 PM  Result Value Ref Range   Sodium 140 135 - 145 mmol/L   Potassium 3.7 3.5 - 5.1 mmol/L   Chloride 104 98 - 111 mmol/L   CO2 26 22 - 32 mmol/L   Glucose, Bld 130 (H) 70 - 99 mg/dL   BUN 18 6 - 20 mg/dL   Creatinine, Ser 1.06 0.61 - 1.24 mg/dL   Calcium 9.8 8.9 - 10.3 mg/dL   Total Protein 8.0 6.5 - 8.1 g/dL   Albumin 4.8 3.5 - 5.0 g/dL   AST 28 15 - 41 U/L   ALT 19 0 - 44 U/L   Alkaline Phosphatase 47 38 - 126 U/L   Total Bilirubin 1.2 0.3 - 1.2 mg/dL   GFR calc non Af Amer >60 >60 mL/min   GFR calc Af Amer >60 >60 mL/min    Comment: (NOTE) The eGFR has been calculated using the CKD EPI equation. This calculation has not been validated in all clinical  situations. eGFR's persistently <60 mL/min signify possible Chronic Kidney Disease.    Anion gap 10 5 - 15    Comment: Performed at [facility], [address] [place], [place] [address]  Ethanol     Status: None   Collection Time: 08/26/18  9:04 PM  Result Value Ref Range   Alcohol, Ethyl (B) <10 <10 mg/dL    Comment: (NOTE) Lowest detectable limit for serum alcohol is 10 mg/dL. For medical purposes only. Performed at [facility], [address] [place], [place] [address]   cbc     Status: None   Collection Time: 08/26/18  9:04 PM  Result Value Ref Range   WBC 9.6 4.0 - 10.5 K/uL   RBC 4.59 4.22 - 5.81 MIL/uL   Hemoglobin 14.6 13.0 - 17.0 g/dL   HCT 41.6 39.0 - 52.0 %   MCV 90.6 80.0 - 100.0 fL   MCH 31.8 26.0 - 34.0 pg   MCHC 35.1 30.0 - 36.0 g/dL   RDW 12.9 11.5 - 15.5 %   Platelets 236 150 - 400 K/uL   nRBC 0.0 0.0 - 0.2 %    Comment: Performed at [facility], [address], [place], [place] [address]  Acetaminophen level     Status: Abnormal   Collection Time: 08/26/18  9:04 PM  Result Value Ref Range   Acetaminophen (Tylenol), Serum <10 (L) 10 - 30 ug/mL    Comment: (NOTE) Therapeutic concentrations  vary significantly. A range of 10-30 ug/mL  may be an effective concentration for many patients. However, some  are best treated at concentrations outside of this range. Acetaminophen concentrations >150 ug/mL at 4 hours after ingestion  and >50 ug/mL at 12 hours after ingestion are often associated with  toxic reactions. Performed at [facility], [address] [place], [place] [address]   Salicylate level     Status: None   Collection Time: 08/26/18  9:04 PM  Result Value Ref Range   Salicylate Lvl <0.8 2.8 - 30.0 mg/dL    Comment: Performed at [facility], [address] [place], [place] [address]    Blood Alcohol level:  Lab Results  Component Value Date   [place] <10 08/26/2018   ETH <10 50/93/2671    Metabolic Disorder Labs:  Lab Results  Component Value Date   HGBA1C 5.8 01/21/2014   No results found for: PROLACTIN Lab Results  Component Value Date   TRIG 124 02/11/2014    Current Medications: Current Facility-Administered Medications  Medication Dose Route Frequency Provider Last Rate Last Dose  . alum & mag hydroxide-simeth (MAALOX/MYLANTA) 200-200-20 MG/5ML suspension 30 mL  30 mL Oral Q4H PRN Atanacio Melnyk B, MD      . ARIPiprazole (ABILIFY) tablet 10 mg  10 mg Oral Daily Sopheap Basic B, MD   10 mg at 08/28/18 1209  . cloNIDine (CATAPRES) tablet 0.1 mg  0.1 mg Oral BID Ruston Fedora B, MD   0.1 mg at 08/28/18 1209  . magnesium hydroxide (MILK OF MAGNESIA) suspension 30 mL  30 mL Oral Daily PRN Breckan Cafiero B, MD      . nicotine (NICODERM CQ - dosed in mg/24 hours) patch 21 mg  21 mg Transdermal Once Denali Sharma B, MD   21 mg at 08/28/18 1209  . traZODone (DESYREL) tablet 100 mg  100 mg Oral QHS PRN Carlynn Leduc B, MD      . venlafaxine XR (EFFEXOR-XR) 24 hr capsule 150 mg  150 mg Oral Q breakfast Tuff Clabo B, MD   150 mg at 08/28/18 1209   PTA Medications: Medications Prior to Admission   Medication Sig Dispense Refill Last Dose  . naloxone (NARCAN) nasal spray 4 mg/0.1 mL Use as directed 2 kit 1     Musculoskeletal: Strength & Muscle Tone: within normal limits Gait & Station: normal Patient leans: N/A  Psychiatric Specialty Exam: I reviewed physical exam performed in the ER and agree with the findings. Physical Exam  Nursing note and vitals reviewed. Psychiatric: His speech is normal. His affect is blunt. He is withdrawn. Cognition and memory are normal. He expresses impulsivity. He exhibits a depressed mood. He expresses suicidal ideation.    Review of Systems  Neurological: Negative.   Psychiatric/Behavioral: Positive for depression, substance abuse and suicidal ideas.  All other systems reviewed and are negative.   Blood pressure 102/70, pulse 80, temperature 97.8 F (36.6 C), temperature source Oral, resp. rate 16, height '5\' 8"'  (1.727 m), weight 78 kg, SpO2 99 %.Body mass index is 26.15 kg/m.  See SRA                                                  Sleep:  Number of Hours: 7.5    Treatment Plan Summary: Daily contact with patient to assess and evaluate symptoms and progress in treatment and Medication management   Mr. Ricciardelli has a history of substance abuse and mood instability admitted after suicide attempt by hanging innthe context of relationship problems.   #Suicidal ideation -patient able to contract for safety in the hospital  #Mood -agrees to start Effexor XR 150 mg daily -Abilify 10 mg daily -Trazodone 100 mg nightly  #Opioid withdrawal -he has been buying Suboxone off thje streets -Clonidine 0.1 mg BID -symptomatic treatment if necessary  #Substance abuse treatment Declines residential treatment -agrees to go to [facility] program  #Disposition -discharge to home with family -follow up with [facility]   Observation Level/Precautions:  15 minute checks  Laboratory:  CBC Chemistry Profile UDS UA   Psychotherapy:    Medications:    Consultations:    Discharge Concerns:    Estimated LOS:  Other:     Physician Treatment Plan for Primary Diagnosis: Major depressive disorder, recurrent severe without psychotic features ([place]) Long Term Goal(s): Improvement in symptoms so as ready for discharge  Short Term Goals: Ability to identify changes in lifestyle to reduce recurrence of condition will improve, Ability to verbalize feelings will improve, Ability to disclose and discuss suicidal ideas, Ability to demonstrate self-control will improve, Ability to identify and develop effective coping behaviors will improve, Ability to maintain clinical measurements within normal limits will improve, Compliance with prescribed medications will improve and Ability to identify triggers associated with substance abuse/mental health issues will improve  Physician Treatment Plan for Secondary Diagnosis: Principal Problem:   Major depressive disorder, recurrent severe without psychotic features ([place]) Active Problems:   Uncomplicated opioid dependence ([place])   Tobacco use disorder   Methamphetamine abuse ([place])   Cannabis use disorder, moderate, dependence ([place])  Long Term Goal(s): Improvement in symptoms so as ready for discharge  Short Term Goals: Ability to identify changes in lifestyle to reduce recurrence of condition will improve, Ability to demonstrate self-control will improve and Ability to identify triggers associated with substance abuse/mental health issues will improve  I certify that inpatient services furnished can reasonably be expected to improve the patient's condition.    Orson Slick, MD 10/29/201912:14 PM

## 2018-08-28 NOTE — B[facility] Counselor (Signed)
Adult Comprehensive Assessment  Patient ID: Edwin [name], male   DOB: 08-May-1986, 32 y.o.   MRN: [id number]  Information Source: Information source: Patient   Current Stressors:  Patient states their primary concerns and needs for treatment are: "My dad and girlfriend thought I was trying to hurt myself because I was depressed and high off drugs." Patient states their goals for this hospitalization and ongoing recovery are:: "To get prescribed some depression medications and go home." Educational / Learning stressors: none reported Employment / Job issues: Unemployed Family Relationships: "goodEngineer, petroleum / Lack of resources (include bankruptcy): Patient declined any substance abuse treatment and reports that he has several court dates coming up starting August 30, 2018 in [place]. Housing / Lack of housing: stable Physical health (include injuries & life threatening diseases): none reported Social relationships: "good" Substance abuse: "anything that comes in a needle." Patients UDS was positive for Amphetamines, THC, Opiates.  Bereavement / Loss: "my kids mom died 2 years ago"   Living/Environment/Situation:  Living Arrangements: Parent Who else lives in the home?: dad, grandmother How long has patient lived in current situation?: 7 years What is atmosphere in current home: Comfortable   Family History:  Marital status: In a relationship, 1 year, no issues reported. Are you sexually active?: Yes What is your sexual orientation?: Heterosexual  Has your sexual activity been affected by drugs, alcohol, medication, or emotional stress?: None reported  Does patient have children?: Yes How many children?: 2 How is patient's relationship with their children?: 2 sons, pt does not have custody of them. They live with his mother but he sees them frequently.    Childhood History:  By whom was/is the patient raised?: Both parents Additional childhood history information:  Parents divorced.  Description of patient's relationship with caregiver when they were a child: "they were for their own happiness" Patient's description of current relationship with people who raised him/her: "there aint much of one" How were you disciplined when you got in trouble as a child/adolescent?: Physical  Does patient have siblings?: Yes Number of Siblings: 2 Description of patient's current relationship with siblings: "not close to them" Did patient suffer any verbal/emotional/physical/sexual abuse as a child?: No Did patient suffer from severe childhood neglect?: No Has patient ever been sexually abused/assaulted/raped as an adolescent or adult?: No Was the patient ever a victim of a crime or a disaster?: No Witnessed domestic violence?: No Has patient been effected by domestic violence as an adult?: No   Education:  Highest grade of school patient has completed: GED Currently a Consulting civil engineer?: No Learning disability?: No   Employment/Work Situation:   Employment situation: Unemployed Patient's job has been impacted by current illness: No What is the longest time patient has a held a job?: 6 years but was fired after going to jail.  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  Did You Receive Any Psychiatric Treatment/Services While in the Military?: No Are There Guns or Other Weapons in Your Home?: No   Financial Resources:   Financial resources: Help from family members Does patient have a representative payee or guardian?: No   Alcohol/Substance Abuse:   Alcohol/Substance Abuse: "Anything I can get in a needle." Patients UDS was positive for Amphetamines, THC, Opiates.  Alcohol/Substance Abuse Treatment Hx: Past Tx, Inpatient If yes, describe treatment: IP PSYCH UNIT- [facility] Has alcohol/substance abuse ever caused legal problems?: Yes(PROBATION - paraphenalia)   Social Support System:   Patient's Community Support System: Good Describe  Community  Support System: Father, [place], Girlfriend  Type of faith/religion: none reported How does patient's faith help to cope with current illness?: no   Leisure/Recreation:   Leisure and Hobbies: "Spend time with my kids and yardwork."   Strengths/Needs:   What is the patient's perception of their strengths?: Patient unable to identify any. Patient states they can use these personal strengths during their treatment to contribute to their recovery: N/A Patient states these barriers may affect/interfere with their treatment: none reported  Patient states these barriers may affect their return to the community: none reported   Discharge Plan:   Currently receiving community mental health services: Yes, [facility] Patient states they will know when they are safe and ready for discharge when: "When I get on depression medication." Does patient have access to transportation?: Yes Does patient have financial barriers related to discharge medications?: Yes, no insurance, no income. Will patient be returning to same living situation after discharge?: Yes   Summary/Recommendations:   Summary and Recommendations (to be completed by the evaluator): Patient is a 32 year old male admitted involuntarily and diagnosed with Major depressive disorder, recurrent severe without psychotic features. The patient has a history of polysubstance to dependence who was brought to the emergency room after a suspected suicide attempt. The patient does report severe depressive symptoms and substance abuse. He reports using "anything that I can get through a needle." His UDS was positive for Patients UDS was positive for Amphetamines, THC, Opiates.  Patient declined any substance abuse treatment and reports that he has several court dates coming up starting August 30, 2018, then November 14th in [place]. Patient will benefit from crisis stabilization, medication evaluation, group therapy and  psychoeducation. In addition to case management for discharge planning. At discharge it is recommended that patient adhere to the established discharge plan and continue treatment.  Edwin [name]. 08/28/2018

## 2018-08-28 NOTE — Progress Notes (Signed)
Patient refused his morning medication stating "it makes me sleepy". This Clinical research associate will notify MD.

## 2018-08-28 NOTE — BHH Suicide Risk Assessment (Signed)
[facility] Admission Suicide Risk Assessment   Nursing information obtained from:  Patient Demographic factors:  Male, Unemployed, Caucasian Current Mental Status:  NA Loss Factors:  NA Historical Factors:  Prior suicide attempts, Impulsivity Risk Reduction Factors:  Positive social support, Positive therapeutic relationship, Living with another person, especially a relative  Total Time spent with patient: 1 hour Principal Problem: Major depressive disorder, recurrent severe without psychotic features (HCC) Diagnosis:   Patient Active Problem List   Diagnosis Date Noted  . Major depressive disorder, recurrent severe without psychotic features (HCC) [F33.2]     Priority: High  . Cannabis abuse [F12.10]   . Major depressive disorder, recurrent episode, severe (HCC) [F33.2] 08/11/2018  . 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 Cannabis use disorder, moderate, dependence (HCC) [F12.20]   . Tobacco use disorder [F17.200] 01/28/2016  . Uncomplicated opioid dependence (HCC) [F11.20]   . Opiate or related narcotic overdose (HCC) [T40.601A] 11/28/2015  . Substance abuse (HCC) [F19.10]    Subjective Data: suicide attempt by hanging  Continued Clinical Symptoms:  Alcohol Use Disorder Identification Test Final Score (AUDIT): 1 The "Alcohol Use Disorders Identification Test", Guidelines for Use in Primary Care, Second Edition.  World Science writer [facility]). Score between 0-7:  no or low risk or alcohol related problems. Score between 8-15:  moderate risk of alcohol related problems. Score between 16-19:  high risk of alcohol related problems. Score 20 or above:  warrants further diagnostic evaluation for alcohol dependence and treatment.   CLINICAL FACTORS:   Depression:   Comorbid alcohol abuse/dependence Impulsivity Alcohol/Substance Abuse/Dependencies   Musculoskeletal: Strength & Muscle Tone: within normal limits Gait & Station: normal Patient leans: N/A  Psychiatric Specialty  Exam: Physical Exam  Nursing note and vitals reviewed. Psychiatric: His speech is normal. His affect is blunt. He is withdrawn. Cognition and memory are normal. He expresses impulsivity. He exhibits a depressed mood. He expresses suicidal ideation.    Review of Systems  Neurological: Negative.   Psychiatric/Behavioral: Positive for depression, substance abuse and suicidal ideas.  All other systems reviewed and are negative.   Blood pressure 102/70, pulse 80, temperature 97.8 F (36.6 C), temperature source Oral, resp. rate 16, height 5\' 8"  (1.727 m), weight 78 kg, SpO2 99 %.Body mass index is 26.15 kg/m.  General Appearance: Casual  Eye Contact:  Good  Speech:  Clear and Coherent  Volume:  Normal  Mood:  Depressed  Affect:  Flat  Thought Process:  Goal Directed and Descriptions of Associations: Intact  Orientation:  Full (Time, Place, and Person)  Thought Content:  WDL  Suicidal Thoughts:  Yes.  with intent/plan  Homicidal Thoughts:  No  Memory:  Immediate;   Fair Recent;   Fair Remote;   Fair  Judgement:  Poor  Insight:  Lacking  Psychomotor Activity:  Psychomotor Retardation  Concentration:  Concentration: Fair and Attention Span: Fair  Recall:  [employer] of Knowledge:  Fair  Language:  Fair  Akathisia:  No  Handed:  Right  AIMS (if indicated):     Assets:  Communication Skills Desire for Improvement Housing Physical Health Resilience Social Support  ADL's:  Intact  Cognition:  WNL  Sleep:  Number of Hours: 7.5      COGNITIVE FEATURES THAT CONTRIBUTE TO RISK:  None    SUICIDE RISK:   Severe:  Frequent, intense, and enduring suicidal ideation, specific plan, no subjective intent, but some objective markers of intent (i.e., choice of lethal method), the method is accessible, some limited  preparatory behavior, evidence of impaired self-control, severe dysphoria/symptomatology, multiple risk factors present, and few if any protective factors, particularly a lack  of social support.  PLAN OF CARE: hospital admission, medication management, substance abuse counseling, discharge planning.  Mr. Aloisi has a history of substance abuse and mood instability admitted after suicide attempt by hanging innthe context of relationship problems.   #Suicidal ideation -patient able to contract for safety in the hospital  #Mood -agrees to start Effexor XR 150 mg daily -Abilify 10 mg daily -Trazodone 100 mg nightly  #Opioid withdrawal -he has been buying Suboxone off thje streets -Clonidine 0.1 mg BID -symptomatic treatment if necessary  #Substance abuse treatment Declines residential treatment -agrees to go to [facility] program  #Disposition -discharge to home with family -follow up with [facility]   I certify that inpatient services furnished can reasonably be expected to improve the patient's condition.   Kristine Linea, MD 08/28/2018, 12:14 PM

## 2018-08-28 NOTE — Progress Notes (Signed)
[facility] MD Progress Note  08/29/2018 11:40 AM Edwin [name]  MRN:  [id number]  Subjective:   Mr. Winther met with treatn=ment team. He has no complaints and denies suicidal ideation. He accepted medications last night except for Clonidine that makes him sleepy. No signs of withrawal. He met with Sherrian Divers today to discuss substance abuse treatment. He seems committed to follow up with [facility] for their SA IOP and Suboxone program.    Principal Problem: Major depressive disorder, recurrent severe without psychotic features ([place]) Diagnosis:   Patient Active Problem List   Diagnosis Date Noted  . Major depressive disorder, recurrent severe without psychotic features ([place]) [F33.2]     Priority: High  . Cannabis abuse [F12.10]   . Major depressive disorder, recurrent episode, severe ([place]) [F33.2] 08/11/2018  . 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 Cannabis use disorder, moderate, dependence ([place]) [F12.20]   . Tobacco use disorder [F17.200] 01/28/2016  . Uncomplicated opioid dependence ([place]) [F11.20]   . Opiate or related narcotic overdose ([place]) [T40.601A] 11/28/2015  . Substance abuse ([place]) [F19.10]    Total Time spent with patient: 20 minutes  Past Psychiatric History: depression, substance abuse  Past Medical History:  Past Medical History:  Diagnosis Date  . Drug overdose   . DVT (deep venous thrombosis) ([place])   . Hepatitis C     Past Surgical History:  Procedure Laterality Date  . DENTAL SURGERY     Family History: History reviewed. No pertinent family history. Family Psychiatric  History: none Social History:  Social History   Substance and Sexual Activity  Alcohol Use Yes   Comment: beer and liquor, binges     Social History   Substance and Sexual Activity  Drug Use Yes  . Frequency: 1.0 times per week  . Types: Marijuana, IV, Cocaine, Methamphetamines   Comment: heroin    Social History   Socioeconomic History  . Marital status: Single     Spouse name: Not on file  . Number of children: Not on file  . Years of education: Not on file  . Highest education level: Not on file  Occupational History  . Not on file  Social Needs  . Financial resource strain: Not on file  . Food insecurity:    Worry: Not on file    Inability: Not on file  . Transportation needs:    Medical: Not on file    Non-medical: Not on file  Tobacco Use  . Smoking status: Current Every Day Smoker    Packs/day: 1.00    Years: 20.00    Pack years: 20.00    Types: Cigarettes  . Smokeless tobacco: Never Used  Substance and Sexual Activity  . Alcohol use: Yes    Comment: beer and liquor, binges  . Drug use: Yes    Frequency: 1.0 times per week    Types: Marijuana, IV, Cocaine, Methamphetamines    Comment: heroin  . Sexual activity: Not on file  Lifestyle  . Physical activity:    Days per week: Not on file    Minutes per session: Not on file  . Stress: Not on file  Relationships  . Social connections:    Talks on phone: Not on file    Gets together: Not on file    Attends religious service: Not on file    Active member of club or organization: Not on file    Attends meetings of clubs or organizations: Not on file    Relationship status: Not  on file  Other Topics Concern  . Not on file  Social History Narrative  . Not on file   Additional Social History:                         Sleep: Fair  Appetite:  Fair  Current Medications: Current Facility-Administered Medications  Medication Dose Route Frequency Provider Last Rate Last Dose  . alum & mag hydroxide-simeth (MAALOX/MYLANTA) 200-200-20 MG/5ML suspension 30 mL  30 mL Oral Q4H PRN Masha Orbach B, MD      . ARIPiprazole (ABILIFY) tablet 10 mg  10 mg Oral Daily Hoorain Kozakiewicz B, MD   10 mg at 08/29/18 1050  . magnesium hydroxide (MILK OF MAGNESIA) suspension 30 mL  30 mL Oral Daily PRN Dashonda Bonneau B, MD      . nicotine (NICODERM CQ - dosed in mg/24 hours)  patch 21 mg  21 mg Transdermal Daily Laronica Bhagat B, MD      . traZODone (DESYREL) tablet 100 mg  100 mg Oral QHS PRN Marlee Trentman B, MD      . venlafaxine XR (EFFEXOR-XR) 24 hr capsule 150 mg  150 mg Oral Q breakfast Alezander Dimaano B, MD   150 mg at 08/29/18 1050    Lab Results:  No results found for this or any previous visit (from the past 48 hour(s)).  Blood Alcohol level:  Lab Results  Component Value Date   ETH <10 08/26/2018   ETH <10 53/61/4431    Metabolic Disorder Labs: Lab Results  Component Value Date   HGBA1C 5.8 01/21/2014   No results found for: PROLACTIN Lab Results  Component Value Date   TRIG 124 02/11/2014    Physical Findings: AIMS:  , ,  ,  ,    CIWA:  CIWA-Ar Total: 0 COWS:     Musculoskeletal: Strength & Muscle Tone: within normal limits Gait & Station: normal Patient leans: N/A  Psychiatric Specialty Exam: Physical Exam  Nursing note and vitals reviewed. Psychiatric: His speech is normal. He is withdrawn. Cognition and memory are normal. He expresses impulsivity. He exhibits a depressed mood. He expresses suicidal ideation.    Review of Systems  Neurological: Negative.   Psychiatric/Behavioral: Positive for depression and substance abuse.  All other systems reviewed and are negative.   Blood pressure 119/81, pulse 82, temperature 97.9 F (36.6 C), temperature source Oral, resp. rate 16, height _0  (1.727 m), weight 78 kg, SpO2 98 %.Body mass index is 26.15 kg/m.  General Appearance: Casual  Eye Contact:  Good  Speech:  Clear and Coherent  Volume:  Normal  Mood:  Depressed  Affect:  Blunt  Thought Process:  Goal Directed and Descriptions of Associations: Intact  Orientation:  Full (Time, Place, and Person)  Thought Content:  WDL  Suicidal Thoughts:  No  Homicidal Thoughts:  No  Memory:  Immediate;   Fair Recent;   Fair Remote;   Fair  Judgement:  Poor  Insight:  Lacking  Psychomotor Activity:  Decreased   Concentration:  Concentration: Fair and Attention Span: Fair  Recall:  [employer] of Knowledge:  Fair  Language:  Fair  Akathisia:  No  Handed:  Right  AIMS (if indicated):     Assets:  Communication Skills Desire for Improvement Housing Physical Health Resilience Social Support  ADL's:  Intact  Cognition:  WNL  Sleep:  Number of Hours: 8.25     Treatment Plan Summary: Daily contact with  patient to assess and evaluate symptoms and progress in treatment and Medication management   Mr. Detweiler has a history of substance abuse and mood instability admitted after suicide attempt by hanging in the context of relationship problems.   #Suicidal ideation, denies -patient able to contract for safety in the hospital  #Mood, accepted medications -continue Effexor XR 150 mg daily -Abilify 10 mg daily -Trazodone 100 mg nightly  #Opioid withdrawal -he has been buying Suboxone off the streets -discontinue Clonidine 0.1 mg BID -symptomatic treatment if necessary  #Substance abuse treatment -declines residential treatment -agrees to go to [facility] program  #Labs -lipid panel, TSH, A1C -EKG  #Disposition -discharge to home with family -follow up with Lorna Dibble, MD 08/29/2018, 11:40 AM

## 2018-08-29 LAB — LIPID PANEL
CHOLESTEROL: 147 mg/dL (ref 0–200)
HDL: 38 mg/dL — AB (ref >40)
LDL CALC: 92 mg/dL (ref 0–99)
TRIGLYCERIDES: 83 mg/dL (ref <150)
Total CHOL/HDL Ratio: 3.9 RATIO
VLDL: 17 mg/dL (ref 0–40)

## 2018-08-29 LAB — HEMOGLOBIN A1C
Hgb A1c MFr Bld: 5 % (ref 4.8–5.6)
Mean Plasma Glucose: 96.8 mg/dL

## 2018-08-29 LAB — TSH: TSH: 0.436 u[IU]/mL (ref 0.350–4.500)

## 2018-08-29 MED ORDER — NICOTINE 21 MG/24HR TD PT24
21.0000 mg | MEDICATED_PATCH | Freq: Every day | TRANSDERMAL | Status: DC
  Administered 2018-08-29 – 2018-08-31 (×3): 21 mg via TRANSDERMAL
  Filled 2018-08-29 (×2): qty 1

## 2018-08-29 NOTE — Progress Notes (Signed)
Recreation Therapy Notes  INPATIENT RECREATION THERAPY ASSESSMENT  Patient Details Name: Edwin [name] MRN: [id number] DOB: 1986/03/22 Today's Date: 08/29/2018       Information Obtained From: Patient  Able to Participate in Assessment/Interview: Yes  Patient Presentation: Responsive  Reason for Admission (Per Patient): Active Symptoms, Substance Abuse, Other (Comments)(Depression)  Patient Stressors:    Coping Skills:   Substance Abuse  Leisure Interests (2+):  Social - Family, Individual - TV  Frequency of Recreation/Participation: Weekly  Awareness of Community Resources:  Yes  Community Resources:  Gym  Current Use:    If no, Barriers?:    Expressed Interest in [employer] Information:    [place] of Residence:  Film/video editor  Patient Main Form of Transportation: Set designer  Patient Strengths:  put others before myself  Patient Identified Areas of Improvement:  I do not know  Patient Goal for Hospitalization:  Get medication and better perspective   Current SI (including self-harm):  No  Current HI:  No  Current AVH: No  Staff Intervention Plan: Group Attendance, Collaborate with Interdisciplinary Treatment Team  Consent to Intern Participation: N/A  Edwin [name] 08/29/2018, 4:28 PM

## 2018-08-29 NOTE — B[facility] Group Notes (Signed)
[facility] Group Notes:  (Nursing/MHT/Case Management/Adjunct)  Date:  08/29/2018  Time:  9:43 PM  Type of Therapy:  Group Therapy  Participation Level:  Did Not Attend   Mayra Neer 08/29/2018, 9:43 PM

## 2018-08-29 NOTE — Progress Notes (Addendum)
Edwin [name]  08/30/2018 1:45 PM Edwin [name]  MRN:  [id number]  Subjective:    Edwin [name] has no complaints. He accepts medications and tolerates them well. He agreed to take Abilify maintena today. He is asking about his court date this morning and appreciates that we send a letter. He still refused residential treatment but "is thinking about it". He did get frightened with his last suicide attempt and wants to get better.   Principal Problem: Major depressive disorder, recurrent severe without psychotic features (HCC) Diagnosis:   Patient Active Problem List   Diagnosis Date Noted  . Major depressive disorder, recurrent severe without psychotic features (HCC) [F33.2]     Priority: High  . Cannabis abuse [F12.10]   . Major depressive disorder, recurrent episode, severe (HCC) [F33.2] 08/11/2018  . 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 Cannabis use disorder, moderate, dependence (HCC) [F12.20]   . Tobacco use disorder [F17.200] 01/28/2016  . Uncomplicated opioid dependence (HCC) [F11.20]   . Opiate or related narcotic overdose (HCC) [T40.601A] 11/28/2015  . Substance abuse (HCC) [F19.10]    Total Time spent with patient: 20 minutes  Past Psychiatric History: depression, substance abuse  Past Medical History:  Past Medical History:  Diagnosis Date  . Drug overdose   . DVT (deep venous thrombosis) (HCC)   . Hepatitis C     Past Surgical History:  Procedure Laterality Date  . DENTAL SURGERY     Family History: History reviewed. No pertinent family history. Family Psychiatric  History: none Social History:  Social History   Substance and Sexual Activity  Alcohol Use Yes   Comment: beer and liquor, binges     Social History   Substance and Sexual Activity  Drug Use Yes  . Frequency: 1.0 times per week  . Types: Marijuana, IV, Cocaine, Methamphetamines   Comment: heroin    Social History   Socioeconomic History  . Marital status: Single     Spouse name: Not on file  . Number of children: Not on file  . Years of education: Not on file  . Highest education level: Not on file  Occupational History  . Not on file  Social Needs  . Financial resource strain: Not on file  . Food insecurity:    Worry: Not on file    Inability: Not on file  . Transportation needs:    Medical: Not on file    Non-medical: Not on file  Tobacco Use  . Smoking status: Current Every Day Smoker    Packs/day: 1.00    Years: 20.00    Pack years: 20.00    Types: Cigarettes  . Smokeless tobacco: Never Used  Substance and Sexual Activity  . Alcohol use: Yes    Comment: beer and liquor, binges  . Drug use: Yes    Frequency: 1.0 times per week    Types: Marijuana, IV, Cocaine, Methamphetamines    Comment: heroin  . Sexual activity: Not on file  Lifestyle  . Physical activity:    Days per week: Not on file    Minutes per session: Not on file  . Stress: Not on file  Relationships  . Social connections:    Talks on phone: Not on file    Gets together: Not on file    Attends religious service: Not on file    Active member of club or organization: Not on file    Attends meetings of clubs or organizations: Not on file  Relationship status: Not on file  Other Topics Concern  . Not on file  Social History Narrative  . Not on file   Additional Social History:                         Sleep: Fair  Appetite:  Fair  Current Medications: Current Facility-Administered Medications  Medication Dose Route Frequency Provider Last Rate Last Dose  . alum & mag hydroxide-simeth (MAALOX/MYLANTA) 200-200-20 MG/5ML suspension 30 mL  30 mL Oral Q4H PRN Hall Birchard B, MD      . ARIPiprazole (ABILIFY) tablet 10 mg  10 mg Oral Daily Murle Hellstrom B, MD   10 mg at 08/30/18 0759  . ARIPiprazole ER (ABILIFY MAINTENA) injection 400 mg  400 mg Intramuscular Q28 days [place] Dohmen B, MD      . magnesium hydroxide (MILK OF MAGNESIA)  suspension 30 mL  30 mL Oral Daily PRN Monetta Lick B, MD      . nicotine (NICODERM CQ - dosed in mg/24 hours) patch 21 mg  21 mg Transdermal Daily Abdirahman Chittum B, MD   21 mg at 08/30/18 0809  . traZODone (DESYREL) tablet 100 mg  100 mg Oral QHS PRN Kimble Delaurentis B, MD   100 mg at 08/29/18 2239  . venlafaxine XR (EFFEXOR-XR) 24 hr capsule 150 mg  150 mg Oral Q breakfast Yoandri Congrove B, MD   150 mg at 08/30/18 1610    Lab Results:  Results for orders placed or performed during the hospital encounter of 08/27/18 (from the past 48 hour(s))  Lipid panel     Status: Abnormal   Collection Time: 08/29/18 11:33 AM  Result Value Ref Range   Cholesterol 147 0 - 200 mg/dL   Triglycerides 83 <960 mg/dL   HDL 38 (L) >45 mg/dL   Total CHOL/HDL Ratio 3.9 RATIO   VLDL 17 0 - 40 mg/dL   LDL Cholesterol 92 0 - 99 mg/dL    Comment:        Total Cholesterol/HDL:CHD Risk Coronary Heart Disease Risk Table                     Men   Women  1/2 Average Risk   3.4   3.3  Average Risk       5.0   4.4  2 X Average Risk   9.6   7.1  3 X Average Risk  23.4   11.0        Use the calculated Patient Ratio above and the CHD Risk Table to determine the patient's CHD Risk.        ATP III CLASSIFICATION (LDL):  <100     mg/dL   Optimal  409-811  mg/dL   Near or Above                    Optimal  130-159  mg/dL   Borderline  914-782  mg/dL   High  >956     mg/dL   Very High Performed at [facility], [address] [place], [place] [address]   TSH     Status: None   Collection Time: 08/29/18 11:33 AM  Result Value Ref Range   TSH 0.436 0.350 - 4.500 uIU/mL    Comment: Performed by a 3rd Generation assay with a functional sensitivity of <=0.01 uIU/mL. Performed at [facility], [address] [place], [place] [address]   Hemoglobin A1c  Status: None   Collection Time: 08/29/18 11:33 AM  Result Value Ref Range   Hgb A1c MFr Bld 5.0 4.8 - 5.6 %    Comment:  ([name]) Pre diabetes:          5.7%-6.4% Diabetes:              >6.4% Glycemic control for   <7.0% adults with diabetes    Mean Plasma Glucose 96.8 mg/dL    Comment: Performed at [facility], [address] [place], [place] [address]    Blood Alcohol level:  Lab Results  Component Value Date   ETH <10 08/26/2018   ETH <10 08/10/2018    Metabolic Disorder Labs: Lab Results  Component Value Date   HGBA1C 5.0 08/29/2018   MPG 96.8 08/29/2018   No results found for: PROLACTIN Lab Results  Component Value Date   CHOL 147 08/29/2018   TRIG 83 08/29/2018   HDL 38 (L) 08/29/2018   CHOLHDL 3.9 08/29/2018   VLDL 17 08/29/2018   LDLCALC 92 08/29/2018    Physical Findings: AIMS:  , ,  ,  ,    CIWA:  CIWA-Ar Total: 0 COWS:     Musculoskeletal: Strength & Muscle Tone: within normal limits Gait & Station: normal Patient leans: N/A  Psychiatric Specialty Exam: Physical Exam  Nursing [name] and vitals reviewed. Psychiatric: He has a normal mood and affect. His speech is normal and behavior is normal. Thought content normal. Cognition and memory are normal. He expresses impulsivity.    Review of Systems  Neurological: Negative.   Psychiatric/Behavioral: Positive for substance abuse.  All other systems reviewed and are negative.   Blood pressure 112/90, pulse 95, temperature 97.9 F (36.6 C), temperature source Oral, resp. rate 18, height 5\' 8"  (1.727 m), weight 78 kg, SpO2 98 %.Body mass index is 26.15 kg/m.  General Appearance: Casual  Eye Contact:  Good  Speech:  Clear and Coherent  Volume:  Normal  Mood:  Euthymic  Affect:  Appropriate  Thought Process:  Goal Directed and Descriptions of Associations: Intact  Orientation:  Full (Time, Place, and Person)  Thought Content:  WDL  Suicidal Thoughts:  No  Homicidal Thoughts:  No  Memory:  Immediate;   Fair Recent;   Fair Remote;   Fair  Judgement:  Poor  Insight:  Lacking  Psychomotor Activity:  Normal   Concentration:  Concentration: Fair and Attention Span: Fair  Recall:  [employer] of Knowledge:  Fair  Language:  Fair  Akathisia:  No  Handed:  Right  AIMS (if indicated):     Assets:  Communication Skills Desire for Improvement Housing Physical Health Resilience Social Support  ADL's:  Intact  Cognition:  WNL  Sleep:  Number of Hours: 7.5     Treatment Plan Summary: Daily contact with patient to assess and evaluate symptoms and progress in treatment and Medication management   Edwin [name] has a history of substance abuse and mood instability admitted after suicide attempt by hanging in the context of relationship problems.   #Suicidal ideation, denies -patient able to contract for safety in the hospital  #Mood, accepted medications -continue Effexor XR 150 mg daily -Abilify 10 mg daily -start Abilify maintena 400 mg monthly injections on 10/31 -Trazodone 100 mg nightly  #Opioid withdrawal -he has been buying Suboxone off the streets -discontinue Clonidine 0.1 mg BID -symptomatic treatment if necessary  #Substance abuse treatment -declines residential treatment -agrees to go to [facility]  #  Labs -lipid panel, TSH, A1C are normal -EKG  #Disposition -discharge to home with family -follow up with Loletta Parish, MD 08/30/2018, 1:45 PM

## 2018-08-29 NOTE — Progress Notes (Signed)
   08/29/18 1030  Clinical Encounter Type  Visited With Patient;Health care provider  Visit Type Initial;Spiritual support;Behavioral Health  Referral From Social work  Consult/Referral To Chaplain  Spiritual Encounters  Spiritual Needs Emotional;Other (Comment)   CH attended the patient's treatment team meeting. The patient has been working with a Personnel officer and hopes to still have his job. Mr. Anacker stated that he feels tired from the medication, but hopes to get the right medication working and to have a proper perspective. The patient also states that he has court tomorrow.

## 2018-08-29 NOTE — Plan of Care (Signed)
Flat and depressed but denying thoughts of self harm.

## 2018-08-29 NOTE — Progress Notes (Signed)
Received Edwin [name] this AM in his room, he slept through breakfast. He was OOB for his treatment team meeting and afterwards received his morning medications. He was upset about his length of stay (7days) related to his suicidal attempt  by hanging.  After our talk he understood the rationale for needing more time to sought out his behavior. He denied feeling suicidal, anxious and depressed at the present time. He initially refused his blood draw; after it was explained to him, he consented to one stick. The blood draw was completed. No  Change in his status this PM.

## 2018-08-29 NOTE — Progress Notes (Signed)
Patient was mostly in bed. At the beginning of this shift: patient in bed awake. Alert and oriented. Sade and depressed but denying suicidal/homicidal thoughts. Denying hallucinations. Expressing helplessness and avoiding long conversations. Patient went to the dayroom for a little while. Had a snack and watched TV, then returned to his room. Did not request any medications. Currently sleeping. No sign of distress. Staff continue to monitor for safety.

## 2018-08-29 NOTE — Progress Notes (Signed)
Recreation Therapy Notes  Date: 08/28/2018  Time: 9:30 pm   Location: Craft Room   Behavioral response: N/A   Intervention Topic: Communication  Discussion/Intervention: Patient did not attend group.   Clinical Observations/Feedback:  Patient did not attend group.   Jarod Bozzo LRT/CTRS        Dashon Mcintire 08/29/2018 11:47 AM

## 2018-08-29 NOTE — Tx Team (Addendum)
Interdisciplinary Treatment and Diagnostic Plan Update  08/29/2018 Time of Session: 10:30am Edwin [name] MRN: [id number]  Principal Diagnosis: Major depressive disorder, recurrent severe without psychotic features (Edwin [name])  Secondary Diagnoses: Principal Problem:   Major depressive disorder, recurrent severe without psychotic features (Edwin [name]) Active Problems:   Uncomplicated opioid dependence (Edwin [name])   Tobacco use disorder   Methamphetamine abuse (Edwin [name])   Cannabis use disorder, moderate, dependence (Edwin [name])   Current Medications:  Current Facility-Administered Medications  Medication Dose Route Frequency Provider Last Rate Last Dose  . alum & mag hydroxide-simeth (MAALOX/MYLANTA) 200-200-20 MG/5ML suspension 30 mL  30 mL Oral Q4H PRN Pucilowska, Jolanta B, MD      . ARIPiprazole (ABILIFY) tablet 10 mg  10 mg Oral Daily Pucilowska, Jolanta B, MD   10 mg at 08/29/18 1050  . magnesium hydroxide (MILK OF MAGNESIA) suspension 30 mL  30 mL Oral Daily PRN Pucilowska, Jolanta B, MD      . nicotine (NICODERM CQ - dosed in mg/24 hours) patch 21 mg  21 mg Transdermal Daily Pucilowska, Jolanta B, MD      . traZODone (DESYREL) tablet 100 mg  100 mg Oral QHS PRN Pucilowska, Jolanta B, MD      . venlafaxine XR (EFFEXOR-XR) 24 hr capsule 150 mg  150 mg Oral Q breakfast Pucilowska, Jolanta B, MD   150 mg at 08/29/18 1050   PTA Medications: Medications Prior to Admission  Medication Sig Dispense Refill Last Dose  . naloxone (NARCAN) nasal spray 4 mg/0.1 mL Use as directed 2 kit 1     Patient Stressors: Medication change or noncompliance Substance abuse Other: Depression  Patient Strengths: Ability for insight Capable of independent living Communication skills Supportive family/friends  Treatment Modalities: Medication Management, Group therapy, Case management,  1 to 1 session with clinician, Psychoeducation, Recreational therapy.   Physician Treatment Plan for Primary Diagnosis: Major  depressive disorder, recurrent severe without psychotic features (Edwin [name]) Long Term Goal(s): Improvement in symptoms so as ready for discharge Improvement in symptoms so as ready for discharge   Short Term Goals: Ability to identify changes in lifestyle to reduce recurrence of condition will improve Ability to verbalize feelings will improve Ability to disclose and discuss suicidal ideas Ability to demonstrate self-control will improve Ability to identify and develop effective coping behaviors will improve Ability to maintain clinical measurements within normal limits will improve Compliance with prescribed medications will improve Ability to identify triggers associated with substance abuse/mental health issues will improve Ability to identify changes in lifestyle to reduce recurrence of condition will improve Ability to demonstrate self-control will improve Ability to identify triggers associated with substance abuse/mental health issues will improve  Medication Management: Evaluate patient's response, side effects, and tolerance of medication regimen.  Therapeutic Interventions: 1 to 1 sessions, Unit Group sessions and Medication administration.  Evaluation of Outcomes: Progressing  Physician Treatment Plan for Secondary Diagnosis: Principal Problem:   Major depressive disorder, recurrent severe without psychotic features (Edwin [name]) Active Problems:   Uncomplicated opioid dependence (Edwin [name])   Tobacco use disorder   Methamphetamine abuse (Edwin [name])   Cannabis use disorder, moderate, dependence (Edwin [name])  Long Term Goal(s): Improvement in symptoms so as ready for discharge Improvement in symptoms so as ready for discharge   Short Term Goals: Ability to identify changes in lifestyle to reduce recurrence of condition will improve Ability to verbalize feelings will improve Ability to disclose and discuss suicidal ideas Ability to demonstrate self-control will improve Ability to identify and develop  effective coping behaviors will  improve Ability to maintain clinical measurements within normal limits will improve Compliance with prescribed medications will improve Ability to identify triggers associated with substance abuse/mental health issues will improve Ability to identify changes in lifestyle to reduce recurrence of condition will improve Ability to demonstrate self-control will improve Ability to identify triggers associated with substance abuse/mental health issues will improve     Medication Management: Evaluate patient's response, side effects, and tolerance of medication regimen.  Therapeutic Interventions: 1 to 1 sessions, Unit Group sessions and Medication administration.  Evaluation of Outcomes: Progressing   RN Treatment Plan for Primary Diagnosis: Major depressive disorder, recurrent severe without psychotic features (Edwin [name]) Long Term Goal(s): Knowledge of disease and therapeutic regimen to maintain health will improve  Short Term Goals: Ability to verbalize feelings will improve, Ability to identify and develop effective coping behaviors will improve and Compliance with prescribed medications will improve  Medication Management: RN will administer medications as ordered by provider, will assess and evaluate patient's response and provide education to patient for prescribed medication. RN will report any adverse and/or side effects to prescribing provider.  Therapeutic Interventions: 1 on 1 counseling sessions, Psychoeducation, Medication administration, Evaluate responses to treatment, Monitor vital signs and CBGs as ordered, Perform/monitor CIWA, COWS, AIMS and Fall Risk screenings as ordered, Perform wound care treatments as ordered.  Evaluation of Outcomes: Progressing   LCSW Treatment Plan for Primary Diagnosis: Major depressive disorder, recurrent severe without psychotic features (Edwin [name]) Long Term Goal(s): Safe transition to appropriate next level of care at  discharge, Engage patient in therapeutic group addressing interpersonal concerns.  Short Term Goals: Engage patient in aftercare planning with referrals and resources, Increase ability to appropriately verbalize feelings, Increase emotional regulation, Facilitate acceptance of mental health diagnosis and concerns, Identify triggers associated with mental health/substance abuse issues and Increase skills for wellness and recovery  Therapeutic Interventions: Assess for all discharge needs, 1 to 1 time with Social worker, Explore available resources and support systems, Assess for adequacy in community support network, Educate family and significant other(s) on suicide prevention, Complete Psychosocial Assessment, Interpersonal group therapy.  Evaluation of Outcomes: Progressing   Progress in Treatment: Attending groups: No. Participating in groups: No. Taking medication as prescribed: Yes. Toleration medication: Yes. Family/Significant other contact made: No, will contact:  Pt refused Patient understands diagnosis: Yes. Discussing patient identified problems/goals with staff: Yes. Medical problems stabilized or resolved: Yes. Denies suicidal/homicidal ideation: Yes. Issues/concerns per patient self-inventory: No. Other: NA  New problem(s) identified: No, Describe:  "Get the medications working, have better perspective on what I need to do when I get out of here"  New Short Term/Long Term Goal(s):Get the medications working, have better perspective on what I need to do when I get out of here  Patient Goals:  Get the medications working, have better perspective on what I need to do when I get out of here  Discharge Plan or Barriers: Pt will return home and follow up with outpatient treatment  Reason for Continuation of Hospitalization: Medication stabilization  Estimated Length of Stay:3-5 days  Recreational Therapy: Patient Stressors: N/A Patient Goal: Patient will engage in groups  without prompting or encouragement from LRT x3 group sessions within 5 recreation therapy group sessions  Attendees: Patient:Edwin [name] 08/29/2018 1:54 PM  Physician: Dr. Bary Leriche 08/29/2018 1:54 PM  Nursing: Eugenio Hoes, RN 08/29/2018 1:54 PM  RN Care Manager: 08/29/2018 1:54 PM  Social Worker: Darin Engels, LCSW 08/29/2018 1:54 PM  Recreational Therapist: Isaias Sakai. Devanie Galanti CTRS, LRT 08/29/2018 1:54 PM  Other: Sanjuana Kava, LCSW 08/29/2018 1:54 PM  Other: Marney Doctor, Chaplain 08/29/2018 1:54 PM  Other:Sonya Eulas Post, LCSW 08/29/2018 1:54 PM    Scribe for Treatment Team: Yvette Rack, LCSW 08/29/2018 1:54 PM

## 2018-08-29 NOTE — [facility] Group Notes (Signed)

## 2018-08-30 MED ORDER — ARIPIPRAZOLE ER 400 MG IM SRER
400.0000 mg | INTRAMUSCULAR | Status: DC
  Administered 2018-08-31: 400 mg via INTRAMUSCULAR
  Filled 2018-08-30: qty 2

## 2018-08-30 NOTE — Plan of Care (Signed)
  Problem: Education: Goal: Utilization of techniques to improve thought processes will improve Outcome: Progressing  Patient was guarded did not talk much on depression or if he felt anxious, he denies anxiety.

## 2018-08-30 NOTE — Progress Notes (Addendum)
Recreation Therapy Notes  Date: 08/30/2018  Time: 9:30 pm   Location: Craft Room   Behavioral response: N/A   Intervention Topic: Emotions  Discussion/Intervention: Patient did not attend group.   Clinical Observations/Feedback:  Patient did not attend group.   Zylah Elsbernd LRT/CTRS        Jolanta Pucilowska 08/30/2018 1:44 PM

## 2018-08-30 NOTE — Progress Notes (Signed)
Received Edwin [name] this AM after breakfast, he was compliant with his medications. He denied feeling suicidal. He is mostly isolated in his room throughout the day except for meals.

## 2018-08-30 NOTE — [facility] Group Notes (Signed)
LCSW Group Therapy Note  08/30/2018 1:00 pm  Type of Therapy/Topic:  Group Therapy:  Balance in Life  Participation Level:  Did Not Attend  Description of Group:    This group will address the concept of balance and how it feels and looks when one is unbalanced. Patients will be encouraged to process areas in their lives that are out of balance and identify reasons for remaining unbalanced. Facilitators will guide patients in utilizing problem-solving interventions to address and correct the stressor making their life unbalanced. Understanding and applying boundaries will be explored and addressed for obtaining and maintaining a balanced life. Patients will be encouraged to explore ways to assertively make their unbalanced needs known to significant others in their lives, using other group members and facilitator for support and feedback.  Therapeutic Goals: 1. Patient will identify two or more emotions or situations they have that consume much of in their lives. 2. Patient will identify signs/triggers that life has become out of balance:  3. Patient will identify two ways to set boundaries in order to achieve balance in their lives:  4. Patient will demonstrate ability to communicate their needs through discussion and/or role plays  Summary of Patient Progress:      Therapeutic Modalities:   Cognitive Behavioral Therapy Solution-Focused Therapy Assertiveness Training  Edwin [name], [place] 08/30/2018 3:34 PM

## 2018-08-30 NOTE — Progress Notes (Signed)
Patient alert and oriented x 4, denies SI/HI/AVH, no distress noted, affect is flat and sad, forwards very little and he is guarded, he denies pain or discomfort and isolated to his room. Patient appears tense and he is not making eye contact with Clinical research associate. Patient was given emotional support and encouraged to attend evening wrap up group.  Patient did not attend evening wrap up group, 15 minutes safety checks maintained, will continue to monitor closely.

## 2018-08-31 MED ORDER — VENLAFAXINE HCL ER 150 MG PO CP24: 150.0000 mg | ORAL_CAPSULE | Freq: Every day | ORAL | 1 refills | Status: DC

## 2018-08-31 MED ORDER — NALOXONE HCL 4 MG/0.1ML NA LIQD: NASAL | 1 refills | Status: DC

## 2018-08-31 MED ORDER — TRAZODONE HCL 100 MG PO TABS: 100.0000 mg | ORAL_TABLET | Freq: Every evening | ORAL | 1 refills | Status: DC | PRN

## 2018-08-31 MED ORDER — ARIPIPRAZOLE ER 400 MG IM SRER: 400.0000 mg | INTRAMUSCULAR | 1 refills | Status: DC

## 2018-08-31 MED ORDER — ARIPIPRAZOLE 10 MG PO TABS: 10.0000 mg | ORAL_TABLET | Freq: Every day | ORAL | 0 refills | Status: DC

## 2018-08-31 NOTE — [facility] Group Notes (Signed)
Feelings Around Relapse 08/31/2018 1PM  Type of Therapy and Topic:  Group Therapy:  Feelings around Relapse and Recovery  Participation Level:  Did Not Attend   Description of Group:    Patients in this group will discuss emotions they experience before and after a relapse. They will process how experiencing these feelings, or avoidance of experiencing them, relates to having a relapse. Facilitator will guide patients to explore emotions they have related to recovery. Patients will be encouraged to process which emotions are more powerful. They will be guided to discuss the emotional reaction significant others in their lives may have to patients' relapse or recovery. Patients will be assisted in exploring ways to respond to the emotions of others without this contributing to a relapse.  Therapeutic Goals: 1. Patient will identify two or more emotions that lead to a relapse for them 2. Patient will identify two emotions that result when they relapse 3. Patient will identify two emotions related to recovery 4. Patient will demonstrate ability to communicate their needs through discussion and/or role plays   Summary of Patient Progress:     Therapeutic Modalities:   Cognitive Behavioral Therapy Solution-Focused Therapy Assertiveness Training Relapse Prevention Therapy   Suzan Slick, LCSW 08/31/2018 3:20 PM

## 2018-08-31 NOTE — Progress Notes (Signed)
Recreation Therapy Notes  INPATIENT RECREATION TR PLAN  Patient Details Name: Edwin [name] MRN: [id number] DOB: 05/29/1986 Today's Date: 08/31/2018  Rec Therapy Plan Is patient appropriate for Therapeutic Recreation?: Yes Treatment times per week: at least 3 Estimated Length of Stay: 5-7 days TR Treatment/Interventions: Group participation (Comment)  Discharge Criteria Pt will be discharged from therapy if:: Discharged Treatment plan/goals/alternatives discussed and agreed upon by:: Patient/family  Discharge Summary Short term goals set: Patient will engage in groups without prompting or encouragement from LRT x3 group sessions within 5 recreation therapy group sessions Short term goals met: Not met Reason goals not met: Patient did not attend any groups Therapeutic equipment acquired: N/A Reason patient discharged from therapy: Discharge from hospital Pt/family agrees with progress & goals achieved: Yes Date patient discharged from therapy: 08/31/18   Cope Marte 08/31/2018, 3:37 PM

## 2018-08-31 NOTE — Plan of Care (Signed)
Patient is responding appropriately to treatment regimen, alert and stable, contract for safety of self and others , encourage patient to engage in social scheduled activities with peers, appetite is good , well hydrated with fluids and juices , sleep is continuous with out any interruptions, patient denies any SI/HI/AVH, at this time , 15 minute safety rounding is in progress, no distress noted.   Problem: Education: Goal: Utilization of techniques to improve thought processes will improve Outcome: Progressing Goal: Knowledge of the prescribed therapeutic regimen will improve Outcome: Progressing   Problem: Coping: Goal: Coping ability will improve Outcome: Progressing Goal: Will verbalize feelings Outcome: Progressing   Problem: Safety: Goal: Ability to disclose and discuss suicidal ideas will improve Outcome: Progressing Goal: Ability to identify and utilize support systems that promote safety will improve Outcome: Progressing   Problem: Education: Goal: Ability to make informed decisions regarding treatment will improve Outcome: Progressing   Problem: Medication: Goal: Compliance with prescribed medication regimen will improve Outcome: Progressing   Problem: Self-Concept: Goal: Ability to disclose and discuss suicidal ideas will improve Outcome: Progressing Goal: Will verbalize positive feelings about self Outcome: Progressing   Problem: Safety: Goal: Ability to remain free from injury will improve Outcome: Progressing

## 2018-08-31 NOTE — Progress Notes (Signed)
Patient denies SI/HI, denies A/V hallucinations. Patient verbalizes understanding of discharge instructions, follow up care and prescriptions.7 days medicines given to patient. Patient given all belongings from [facility] locker. Patient escorted out by staff, transported by family. 

## 2018-08-31 NOTE — Progress Notes (Signed)
Recreation Therapy Notes          Edwin [name] 08/31/2018 11:14 AM

## 2018-08-31 NOTE — Progress Notes (Signed)
  [facility] Adult Case Management Discharge Plan :  Will you be returning to the same living situation after discharge:  Yes,  Pt will live in the home with his father and grandmother At discharge, do you have transportation home?: Yes,  Edwin [name], father Do you have the ability to pay for your medications: Yes,  insurance  Release of information consent forms completed and in the chart;  Patient's signature needed at discharge.  Patient to Follow up at: Follow-up Information    [facility], [facility]. Go on 09/03/2018.   Why:  Please follow up on Monday Novemeber 4, 2019 at 8:30 for your hospital follow up appointment. Please bring your ID, discharge paperwork and all medications that you are taking. Thank you. Contact information: [address] [place] [address] [phone]           Next level of care provider has access to [facility] Link:no  Safety Planning and Suicide Prevention discussed: Yes,  with pt  Have you used any form of tobacco in the last 30 days? (Cigarettes, Smokeless Tobacco, Cigars, and/or Pipes): Yes  Has patient been referred to the Quitline?: Patient refused referral  Patient has been referred for addiction treatment: Pt. refused referral  Suzan Slick, LCSW 08/31/2018, 2:20 PM

## 2018-08-31 NOTE — Discharge Summary (Signed)
Physician Discharge Summary Note  Patient:  Edwin [name] is an 32 y.o., male MRN:  [id number] DOB:  1986-06-18 Patient phone:  [phone] (home)  Patient address:   [place] [address],  Total Time spent with patient: 20 minutes plus 15 min on care coordination and documentation.  Date of Admission:  08/27/2018 Date of Discharge: 08/31/2018  Reason for Admission:  Suicide attempts.  History of Present Illness:   Mr. Hinz is a 32 year old male with a history of mood instability and substance abuse.  Chief complaint. "I need help."  History of present illness. Information was obtained from the patient and the chart. Mr. Santellan was brought to the ER after attempt to hung himself in the woods. His father found him and cut the rope. Apparently, since last discharge, the patient went to [facility] trying to [place] substance abuse counselling. His girlfriend was accepted but he was directed to [facility] but never went. He continued to use substances, mostly Suboxone that he buys off the street. He uses whatever he can put his hands on. No UDS obtained at the time of admission. On the day of admission, his girlfriend who no longer lives with him, came to visit and refused to move back in with him until sober. He felt crushed and wanted to end his life. The patient never expressed any interest in treatment of depression or substance use. He now realized that depression plays a role and wants treatment. He reports poor sleep, decreased appetite, anhedonia, feeling of guilt and hopelessness, crying spells, irritability. He denies suicidal ideation, rather, he acts on impuls. Denies psychotic symptoms. There are no anxiety symptoms.  Past psychiatric history. Two prior hospitalizations after unintentional heroin overdose. Depression started when his girlfriend and the mother of his children died of drug overdose. He has never been in residential treatment program. Martin Majestic to Suboxone clinic  for a while.  Family psychiatric history. None reported.  Social history. Lives with his father and brother. Difficulties maintaining employment due to legal problems.   Principal Problem: Major depressive disorder, recurrent severe without psychotic features [facility]) Discharge Diagnoses: Patient Active Problem List   Diagnosis Date Noted  . Major depressive disorder, recurrent severe without psychotic features ([place]) [F33.2]     Priority: High  . Cannabis abuse [F12.10]   . Major depressive disorder, recurrent episode, severe ([place]) [F33.2] 08/11/2018  . Methamphetamine abuse ([place]) [F15.10]   . Cannabis use disorder, moderate, dependence ([place]) [F12.20]   . Tobacco use disorder [F17.200] 01/28/2016  . Uncomplicated opioid dependence ([place]) [F11.20]   . Opiate or related narcotic overdose ([place]) [T40.601A] 11/28/2015  . Substance abuse ([place]) [F19.10]    Past Medical History:  Past Medical History:  Diagnosis Date  . Drug overdose   . DVT (deep venous thrombosis) ([place])   . Hepatitis C     Past Surgical History:  Procedure Laterality Date  . DENTAL SURGERY     Family History: History reviewed. No pertinent family history.  Social History:  Social History   Substance and Sexual Activity  Alcohol Use Yes   Comment: beer and liquor, binges     Social History   Substance and Sexual Activity  Drug Use Yes  . Frequency: 1.0 times per week  . Types: Marijuana, IV, Cocaine, Methamphetamines   Comment: heroin    Social History   Socioeconomic History  . Marital status: Single    Spouse name: Not on file  . Number of children: Not on file  .  Years of education: Not on file  . Highest education level: Not on file  Occupational History  . Not on file  Social Needs  . Financial resource strain: Not on file  . Food insecurity:    Worry: Not on file    Inability: Not on file  . Transportation needs:    Medical: Not on file    Non-medical: Not on file  Tobacco Use  .  Smoking status: Current Every Day Smoker    Packs/day: 1.00    Years: 20.00    Pack years: 20.00    Types: Cigarettes  . Smokeless tobacco: Never Used  Substance and Sexual Activity  . Alcohol use: Yes    Comment: beer and liquor, binges  . Drug use: Yes    Frequency: 1.0 times per week    Types: Marijuana, IV, Cocaine, Methamphetamines    Comment: heroin  . Sexual activity: Not on file  Lifestyle  . Physical activity:    Days per week: Not on file    Minutes per session: Not on file  . Stress: Not on file  Relationships  . Social connections:    Talks on phone: Not on file    Gets together: Not on file    Attends religious service: Not on file    Active member of club or organization: Not on file    Attends meetings of clubs or organizations: Not on file    Relationship status: Not on file  Other Topics Concern  . Not on file  Social History Narrative  . Not on file    Hospital Course:    Mr. Begue has a history of substance abuse and mood instability admitted after suicide attempt by hanging in the context of relationship problems. This time, he accepted medications and tolerated them well. He agreed to follow up with [facility]. At the time pf discharge, he is no longer suicidal or homicidal. He is able to contract for safety. He is forward thinking and optimistic about the future.   #Mood, accepted medications -continueEffexor XR 150 mg daily -continue Abilify 10 mg daily for 2 weeks -continue Abilify maintena 400 mg monthly injections, next doe on 11/28 -Trazodone 100 mg nightly  #Opioid withdrawal -symptomatic treatment was offered  #Substance abuse treatment -declines residential treatment -agrees to go to [facility] program  #Labs -lipid panel, TSH, A1Care normal -EKG  #Disposition -discharge to home with family -follow up with [facility]  Physical Findings: AIMS:  , ,  ,  ,    CIWA:  CIWA-Ar Total: 0 COWS:      Musculoskeletal: Strength & Muscle Tone: within normal limits Gait & Station: normal Patient leans: N/A  Psychiatric Specialty Exam: Physical Exam  ROS  Blood pressure 116/75, pulse 86, temperature 98.1 F (36.7 C), temperature source Oral, resp. rate 18, height '5\' 8"'  (1.727 m), weight 78 kg, SpO2 97 %.Body mass index is 26.15 kg/m.  General Appearance: Casual  Eye Contact:  Good  Speech:  Clear and Coherent  Volume:  Normal  Mood:  Euthymic  Affect:  Appropriate  Thought Process:  Goal Directed and Descriptions of Associations: Intact  Orientation:  Full (Time, Place, and Person)  Thought Content:  WDL  Suicidal Thoughts:  No  Homicidal Thoughts:  No  Memory:  Immediate;   Fair Recent;   Fair Remote;   Fair  Judgement:  Poor  Insight:  Lacking  Psychomotor Activity:  Normal  Concentration:  Concentration: Fair and Attention Span: Fair  Recall:  Smiley Houseman of Knowledge:  Fair  Language:  Fair  Akathisia:  No  Handed:  Right  AIMS (if indicated):     Assets:  Communication Skills Desire for Improvement Housing Intimacy Physical Health Resilience Social Support  ADL's:  Intact  Cognition:  WNL  Sleep:  Number of Hours: 7.5     Have you used any form of tobacco in the last 30 days? (Cigarettes, Smokeless Tobacco, Cigars, and/or Pipes): Yes  Has this patient used any form of tobacco in the last 30 days? (Cigarettes, Smokeless Tobacco, Cigars, and/or Pipes) Yes, Yes, A prescription for an FDA-approved tobacco cessation medication was offered at discharge and the patient refused  Blood Alcohol level:  Lab Results  Component Value Date   ETH <10 08/26/2018   ETH <10 34/91/7915    Metabolic Disorder Labs:  Lab Results  Component Value Date   HGBA1C 5.0 08/29/2018   MPG 96.8 08/29/2018   No results found for: PROLACTIN Lab Results  Component Value Date   CHOL 147 08/29/2018   TRIG 83 08/29/2018   HDL 38 (L) 08/29/2018   CHOLHDL 3.9 08/29/2018   VLDL  17 08/29/2018   [place] 92 08/29/2018    See Psychiatric Specialty Exam and Suicide Risk Assessment completed by Attending Physician prior to discharge.  Discharge destination:  Home  Is patient on multiple antipsychotic therapies at discharge:  No   Has Patient had three or more failed trials of antipsychotic monotherapy by history:  No  Recommended Plan for Multiple Antipsychotic Therapies: NA  Discharge Instructions    Diet - low sodium heart healthy   Complete by:  As directed    Increase activity slowly   Complete by:  As directed      Allergies as of 08/31/2018      Reactions   Vicodin [hydrocodone-acetaminophen] Other (See Comments)   Reaction:  GI upset       Medication List    TAKE these medications     Indication  ARIPiprazole 10 MG tablet Commonly known as:  ABILIFY Take 1 tablet (10 mg total) by mouth daily. Start taking on:  09/01/2018  Indication:  MIXED BIPOLAR AFFECTIVE DISORDER   ARIPiprazole ER 400 MG Srer injection Commonly known as:  ABILIFY MAINTENA Inject 2 mLs (400 mg total) into the muscle every 28 (twenty-eight) days. Start taking on:  09/27/2018  Indication:  MIXED BIPOLAR AFFECTIVE DISORDER   naloxone 4 MG/0.1ML Liqd nasal spray kit Commonly known as:  NARCAN Use as directed  Indication:  Opioid Overdose   traZODone 100 MG tablet Commonly known as:  DESYREL Take 1 tablet (100 mg total) by mouth at bedtime as needed for sleep.  Indication:  Trouble Sleeping   venlafaxine XR 150 MG 24 hr capsule Commonly known as:  EFFEXOR-XR Take 1 capsule (150 mg total) by mouth daily with breakfast. Start taking on:  09/01/2018  Indication:  Major Depressive Disorder      Follow-up Information    P[facility], [employer]. Go on 09/03/2018.   Why:  Please follow up on Monday Novemeber 4, 2019 at 8:30 for your hospital follow up appointment. Please bring your ID, discharge paperwork and all medications that you are taking. Thank  you. Contact information: [address] [place] [address] [phone]           Follow-up recommendations:  Activity:  as tolerated Diet:  regular Other:  keep follow up appointments  Comments:    Signed: Rubee Vega,  MD 08/31/2018, 12:29 PM

## 2018-08-31 NOTE — BHH Suicide Risk Assessment (Signed)
[facility] Discharge Suicide Risk Assessment   Principal Problem: Major depressive disorder, recurrent severe without psychotic features [facility]) Discharge Diagnoses:  Patient Active Problem List   Diagnosis Date Noted  . Major depressive disorder, recurrent severe without psychotic features (HCC) [F33.2]     Priority: High  . Cannabis abuse [F12.10]   . Major depressive disorder, recurrent episode, severe (HCC) [F33.2] 08/11/2018  . 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 Cannabis use disorder, moderate, dependence (HCC) [F12.20]   . Tobacco use disorder [F17.200] 01/28/2016  . Uncomplicated opioid dependence (HCC) [F11.20]   . Opiate or related narcotic overdose (HCC) [T40.601A] 11/28/2015  . Substance abuse (HCC) [F19.10]     Total Time spent with patient: 20 minutes  Musculoskeletal: Strength & Muscle Tone: within normal limits Gait & Station: normal Patient leans: N/A  Psychiatric Specialty Exam: ROS  Blood pressure 116/75, pulse 86, temperature 98.1 F (36.7 C), temperature source Oral, resp. rate 18, height 5\' 8"  (1.727 m), weight 78 kg, SpO2 97 %.Body mass index is 26.15 kg/m.  General Appearance: Casual  Eye Contact::  Good  Speech:  Clear and Coherent409  Volume:  Normal  Mood:  Euthymic  Affect:  Appropriate  Thought Process:  Goal Directed and Descriptions of Associations: Intact  Orientation:  Full (Time, Place, and Person)  Thought Content:  WDL  Suicidal Thoughts:  No  Homicidal Thoughts:  No  Memory:  Immediate;   Fair Recent;   Fair Remote;   Fair  Judgement:  Poor  Insight:  Lacking  Psychomotor Activity:  Normal  Concentration:  Fair  Recall:  [employer] of Knowledge:Fair  Language: Fair  Akathisia:  No  Handed:  Right  AIMS (if indicated):     Assets:  Communication Skills Desire for Improvement Financial Resources/Insurance Housing Intimacy Physical Health Resilience Social Support Vocational/Educational  Sleep:  Number of Hours: 7.5   Cognition: WNL  ADL's:  Intact   Mental Status Per Nursing Assessment::   On Admission:  NA  Demographic Factors:  Male and Caucasian  Loss Factors: Loss of significant relationship and Financial problems/change in socioeconomic status  Historical Factors: Prior suicide attempts and Impulsivity  Risk Reduction Factors:   Responsible for children under 45 years of age, Sense of responsibility to family, Employed, Living with another person, especially a relative and Positive social support  Continued Clinical Symptoms:  Bipolar Disorder:   Mixed State Alcohol/Substance Abuse/Dependencies  Cognitive Features That Contribute To Risk:  None    Suicide Risk:  Minimal: No identifiable suicidal ideation.  Patients presenting with no risk factors but with morbid ruminations; may be classified as minimal risk based on the severity of the depressive symptoms  Follow-up Information    P[facility], [employer]. Go on 09/03/2018.   Why:  Please follow up on Monday Novemeber 4, 2019 at 8:30 for your hospital follow up appointment. Please bring your ID, discharge paperwork and all medications that you are taking. Thank you. Contact information: [address] [place] [address] [phone]           Plan Of Care/Follow-up recommendations:  Activity:  as tolerated Diet:  regular Other:  keep follow up appointments  Kristine Linea, MD 08/31/2018, 12:20 PM

## 2018-09-10 ENCOUNTER — Emergency Department
Admission: EM | Admit: 2018-09-10 | Discharge: 2018-09-10 | Disposition: A | Discharge status: Home / Self Care | Payer: Self-pay | Attending: Emergency Medicine | Admitting: Emergency Medicine

## 2018-09-10 ENCOUNTER — Other Ambulatory Visit: Payer: Self-pay

## 2018-09-10 DIAGNOSIS — R262 Difficulty in walking, not elsewhere classified: Secondary | ICD-10-CM | POA: Insufficient documentation

## 2018-09-10 DIAGNOSIS — Z5321 Procedure and treatment not carried out due to patient leaving prior to being seen by health care provider: Secondary | ICD-10-CM | POA: Insufficient documentation

## 2018-09-10 NOTE — ED Triage Notes (Signed)
Patient reports attempted to hang himself two weeks ago.  Reports since then reports difficulty walking and difficulty having an orgasm.  Denies any type of pain.  Patient states he has stopped taking his Effexor (on Thursday).

## 2018-09-10 NOTE — ED Notes (Signed)
No answer when called several times from lobby 

## 2018-12-07 ENCOUNTER — Other Ambulatory Visit: Payer: Self-pay

## 2018-12-07 ENCOUNTER — Emergency Department
Admission: EM | Admit: 2018-12-07 | Discharge: 2018-12-07 | Disposition: A | Discharge status: Home / Self Care | Payer: Self-pay | Attending: Emergency Medicine | Admitting: Emergency Medicine

## 2018-12-07 ENCOUNTER — Emergency Department: Payer: Self-pay

## 2018-12-07 DIAGNOSIS — Z79899 Other long term (current) drug therapy: Secondary | ICD-10-CM | POA: Insufficient documentation

## 2018-12-07 DIAGNOSIS — T40601A Poisoning by unspecified narcotics, accidental (unintentional), initial encounter: Secondary | ICD-10-CM | POA: Insufficient documentation

## 2018-12-07 DIAGNOSIS — F17210 Nicotine dependence, cigarettes, uncomplicated: Secondary | ICD-10-CM | POA: Insufficient documentation

## 2018-12-07 MED ORDER — ONDANSETRON HCL 4 MG PO TABS
4.0000 mg | ORAL_TABLET | Freq: Once | ORAL | Status: AC
  Administered 2018-12-07: 4 mg via ORAL
  Filled 2018-12-07: qty 1

## 2018-12-07 NOTE — ED Triage Notes (Signed)
Pt to the er via ems for a heroin overdose. Pt girlfriend heard a crash and found her boyfriend in the floor. Original call came out as a cardiac arrest. Spoon and needle found beside body. Pt was given 2mg  of Narcan intranasal and then EMS gave another 2mg  of Narcan intranasal. Pt stated he did not want to come to the hospital to be checked. Police stated that either he came willingly or IVC papers would be taken out and pt would be forced to be checked. On arrival pt is declining any IV sticks for fluids or blood. Pt said he has had "enough sticks today".

## 2018-12-07 NOTE — ED Notes (Signed)
Pt is awake and asking to eat. Pt is given graham crackers, peanut butter, and shasta cola per request.

## 2018-12-07 NOTE — ED Notes (Signed)
Woke pt to ensure he is responsive to voice. Encouraged pt to breathe and notified him that if his respiratory rate continued to decrease we would start an IV.

## 2018-12-07 NOTE — ED Notes (Addendum)
Pt advised that if he became unconscious, we would start an IV on him to continue his medical care. Pt verbalized understanding. Pt given ice chips per request.

## 2018-12-07 NOTE — ED Notes (Signed)
Pt back from CT

## 2018-12-07 NOTE — ED Provider Notes (Signed)
[facility] Emergency Department rovider Note  ____________________________________________   First MD Initiated Contact with Patient 12/07/18 1814     (approximate)  I have reviewed the triage vital signs and the nursing notes.   HISTORY  Chief Complaint Drug Overdose   HPI Edwin [name] is a 33 y.o. male with a history of hep C as well as IV drug use he says that he was shooting heroin earlier today.  Brought in by EMS for altered mentation.  Given a total of 4 mg of intranasal Narcan and now is back to his baseline.  Patient was found bradycardia apneic as well as pale.  Admits to injecting heroin into the right AC.  Denies any other substance abuse.  Says this was recreational and not attempting suicide.  Says that he feels nauseous at this time.  Also with frontal headache.  EMS reports that bystanders heard a "bang" and that is when EMS was called.   Past Medical History:  Diagnosis Date  . Drug overdose   . DVT (deep venous thrombosis) (HCC)   . Hepatitis C     Patient Active Problem List   Diagnosis Date Noted  . Cannabis abuse   . Major depressive disorder, recurrent episode, severe (HCC) 08/11/2018  . Methamphetamine abuse (HCC)   . Cannabis use disorder, moderate, dependence (HCC)   . Tobacco use disorder 01/28/2016  . 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 Uncomplicated opioid dependence (HCC)   . Opiate or related narcotic overdose (HCC) 11/28/2015  . Substance abuse Edwin [name](HCC)     Past Surgical History:  Procedure Laterality Date  . DENTAL SURGERY      Prior to Admission medications   Medication Sig Start Date End Date Taking? Authorizing Provider  ARIPiprazole (ABILIFY) 10 MG tablet Take 1 tablet (10 mg total) by mouth daily. 09/01/18   Pucilowska, Braulio ConteJolanta B, MD  ARIPiprazole ER (ABILIFY MAINTENA) 400 MG SRER injection Inject 2 mLs (400 mg total) into the muscle every 28  (twenty-eight) days. 09/27/18   Pucilowska, Ellin GoodieJolanta B, MD  naloxone (NARCAN) nasal spray 4 mg/0.1 mL Use as directed 08/31/18   Pucilowska, Jolanta B, MD  traZODone (DESYREL) 100 MG tablet Take 1 tablet (100 mg total) by mouth at bedtime as needed for sleep. 08/31/18   Pucilowska, Braulio ConteJolanta B, MD  venlafaxine XR (EFFEXOR-XR) 150 MG 24 hr capsule Take 1 capsule (150 mg total) by mouth daily with breakfast. 09/01/18   Pucilowska, Ellin GoodieJolanta B, MD    Allergies Mucomyst [acetylcysteine] and Vicodin [hydrocodone-acetaminophen]  History reviewed. No pertinent family history.  Social History Social History   Tobacco Use  . Smoking status: Current Every Day Smoker    Packs/day: 1.00    Years: 20.00    Pack years: 20.00    Types: Cigarettes  . Smokeless tobacco: Never Used  Substance Use Topics  . Alcohol use: Yes    Comment: beer and liquor, binges  . Drug use: Yes    Frequency: 1.0 times per week    Types: Marijuana, IV, Cocaine, Methamphetamines    Comment: heroin    Review of Systems  Constitutional: No fever/chills Eyes: No visual changes. ENT: No sore throat. Cardiovascular: Denies chest pain. Respiratory: Denies shortness of breath. Gastrointestinal: No abdominal pain.  no vomiting.  No diarrhea.  No constipation. Genitourinary: Negative for dysuria. Musculoskeletal: Negative for back pain. Skin: Negative for rash. Neurological: Negative for focal weakness or numbness.   ____________________________________________  PHYSICAL EXAM:  VITAL SIGNS: ED Triage Vitals  Enc Vitals Group     BP      Pulse      Resp      Temp      Temp src      SpO2      Weight      Height      Head Circumference      Peak Flow      Pain Score      Pain Loc      Pain Edu?      Excl. in GC?     Constitutional: Alert and oriented.  Patient appears uncomfortable and vomiting into the emesis bag.  However, is awake and alert and able to give a complete history. Eyes: Conjunctivae are  normal.  Head: Atraumatic. Nose: No congestion/rhinnorhea. Mouth/Throat: Mucous membranes are moist.  Neck: No stridor.   Cardiovascular: Normal rate, regular rhythm. Grossly normal heart sounds.   Respiratory: Normal respiratory effort.  No retractions. Lungs CTAB. Gastrointestinal: Soft and nontender. No distention.  Musculoskeletal: No lower extremity tenderness nor edema.  No joint effusions.  Right AC examined and there is no abscess, fluctuance, induration. Neurologic:  Normal speech and language. No gross focal neurologic deficits are appreciated. Skin:  Skin is warm, dry and intact. No rash noted. Psychiatric: Mood and affect are normal. Speech and behavior are normal.  ____________________________________________   LABS (all labs ordered are listed, but only abnormal results are displayed)  Labs Reviewed - No data to display ____________________________________________  EKG  ED ECG REPORT I, Arelia Longestavid M Sevilla Murtagh, the attending physician, personally viewed and interpreted this ECG.   Date: 12/07/2018  EKG Time: 1820  Rate: 85  Rhythm: normal sinus rhythm  Axis: Normal  Intervals:none  ST&T Change: No ST segment elevation or depression.  No abnormal T wave inversion.  ____________________________________________  RADIOLOGY  CT head without acute abnormality. ____________________________________________   PROCEDURES  Procedure(s) performed:   Procedures  Critical Care performed:   ____________________________________________   INITIAL IMPRESSION / ASSESSMENT AND PLAN / ED COURSE  Pertinent labs & imaging results that were available during my care of the patient were reviewed by me and considered in my medical decision making (see chart for details).  DDX: Substance abuse, overdose, IV drug use, heroin abuse, opiate abuse, nausea vomiting, opiate withdrawal As part of my medical decision making, I reviewed the following data within the electronic medical  record:  Notes from prior ED visits  Patient awake and alert and not clinically intoxicated after Narcan at this time.  Refusing IV sticks.  ----------------------------------------- 10:29 PM on 12/07/2018 -----------------------------------------  Patient now sitting up in the bed, fully conscious.  Awake and alert.  Eating peanut butter crackers.  He has been observed for over 4 hours at this time and did not need any subsequent dosing of Narcan.  Did not become very dyspneic or hypoxic.  Patient stated that he used the drugs recreationally and was not attempting to kill himself.  However, says that this is at least the second time he is received Narcan.  Says that he was in the [name] in 2015 and would like follow-up with rehab.  He says that he would not like rehab placement this time because he would like to go to his son's wrestling tournament tomorrow.  He is clinically sober.  Has insight into his medical condition and capacity to make medical decisions.  He will be discharged at this time. ____________________________________________  FINAL CLINICAL IMPRESSION(S) / ED DIAGNOSES  Opiate overdose.  NEW MEDICATIONS STARTED DURING THIS VISIT:  New Prescriptions   No medications on file     Note:  This document was prepared using Dragon voice recognition software and may include unintentional dictation errors.     Myrna Blazer, MD 12/07/18 2230

## 2018-12-07 NOTE — ED Notes (Signed)
Crackers and soda given to patient. No complaints at this time.

## 2018-12-24 ENCOUNTER — Emergency Department: Payer: Self-pay

## 2018-12-24 ENCOUNTER — Other Ambulatory Visit: Payer: Self-pay

## 2018-12-24 ENCOUNTER — Encounter: Payer: Self-pay | Admitting: Emergency Medicine

## 2018-12-24 ENCOUNTER — Emergency Department
Admission: EM | Admit: 2018-12-24 | Discharge: 2018-12-24 | Disposition: A | Discharge status: Home / Self Care | Payer: Self-pay | Attending: Emergency Medicine | Admitting: Emergency Medicine

## 2018-12-24 DIAGNOSIS — Z79899 Other long term (current) drug therapy: Secondary | ICD-10-CM | POA: Insufficient documentation

## 2018-12-24 DIAGNOSIS — F17210 Nicotine dependence, cigarettes, uncomplicated: Secondary | ICD-10-CM | POA: Insufficient documentation

## 2018-12-24 DIAGNOSIS — L03115 Cellulitis of right lower limb: Secondary | ICD-10-CM | POA: Insufficient documentation

## 2018-12-24 DIAGNOSIS — L02415 Cutaneous abscess of right lower limb: Secondary | ICD-10-CM | POA: Insufficient documentation

## 2018-12-24 DIAGNOSIS — L0291 Cutaneous abscess, unspecified: Secondary | ICD-10-CM

## 2018-12-24 LAB — CBC WITH DIFFERENTIAL/PLATELET
Abs Immature Granulocytes: 0.08 10*3/uL — ABNORMAL HIGH (ref 0.00–0.07)
BASOS PCT: 1 %
Basophils Absolute: 0.1 10*3/uL (ref 0.0–0.1)
EOS ABS: 0.2 10*3/uL (ref 0.0–0.5)
EOS PCT: 1 %
HCT: 47.1 % (ref 39.0–52.0)
Hemoglobin: 16.1 g/dL (ref 13.0–17.0)
Immature Granulocytes: 1 %
Lymphocytes Relative: 19 %
Lymphs Abs: 3.4 10*3/uL (ref 0.7–4.0)
MCH: 31.6 pg (ref 26.0–34.0)
MCHC: 34.2 g/dL (ref 30.0–36.0)
MCV: 92.4 fL (ref 80.0–100.0)
MONO ABS: 1.7 10*3/uL — AB (ref 0.1–1.0)
Monocytes Relative: 10 %
Neutro Abs: 12.3 10*3/uL — ABNORMAL HIGH (ref 1.7–7.7)
Neutrophils Relative %: 68 %
PLATELETS: 306 10*3/uL (ref 150–400)
RBC: 5.1 MIL/uL (ref 4.22–5.81)
RDW: 13.2 % (ref 11.5–15.5)
WBC: 17.8 10*3/uL — AB (ref 4.0–10.5)
nRBC: 0 % (ref 0.0–0.2)

## 2018-12-24 MED ORDER — CLINDAMYCIN HCL 150 MG PO CAPS: 300.0000 mg | ORAL_CAPSULE | Freq: Three times a day (TID) | ORAL | 0 refills | Status: DC

## 2018-12-24 MED ORDER — PIPERACILLIN-TAZOBACTAM 3.375 G IVPB 30 MIN
3.3750 g | Freq: Once | INTRAVENOUS | Status: AC
  Administered 2018-12-24: 3.375 g via INTRAVENOUS
  Filled 2018-12-24: qty 50

## 2018-12-24 NOTE — Discharge Instructions (Addendum)
Follow-up with your regular doctor or return emergency department if not better in 3 to 5 days.  Use medications as prescribed.  Apply warm compress to the area.  Return if you are worsening.

## 2018-12-24 NOTE — ED Triage Notes (Signed)
Right upper leg pain,  Abscess per pt, it was a pimple pt popped it and it has increased in size and pain x2days

## 2018-12-24 NOTE — ED Provider Notes (Signed)
[facility] Emergency Department Provider Note  ____________________________________________   First MD Initiated Contact with Patient 12/24/18 1129     (approximate)  I have reviewed the triage vital signs and the nursing notes.   HISTORY  Chief Complaint Leg Pain    HPI Edwin [name] is a 33 y.o. male presents emergency department complaint of abscess to the right thigh.  He states he looked like a pimple and then he popped it and the area is very red and swollen today.  He states it is more painful when he tries to bear weight.  He denies any fever or chills.  However the patient does have a history of IV drug use.  Recent drug overdose.    Past Medical History:  Diagnosis Date  . Drug overdose   . DVT (deep venous thrombosis) (HCC)   . Hepatitis C     Patient Active Problem List   Diagnosis Date Noted  . Cannabis abuse   . Major depressive disorder, recurrent episode, severe (HCC) 08/11/2018  . Methamphetamine abuse (HCC)   . Cannabis use disorder, moderate, dependence (HCC)   . Tobacco use disorder 01/28/2016  . 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 Uncomplicated opioid dependence (HCC)   . Opiate or related narcotic overdose (HCC) 11/28/2015  . Substance abuse [facility])     Past Surgical History:  Procedure Laterality Date  . DENTAL SURGERY      Prior to Admission medications   Medication Sig Start Date End Date Taking? Authorizing Provider  ARIPiprazole (ABILIFY) 10 MG tablet Take 1 tablet (10 mg total) by mouth daily. 09/01/18   Pucilowska, Braulio Conte B, MD  ARIPiprazole ER (ABILIFY MAINTENA) 400 MG SRER injection Inject 2 mLs (400 mg total) into the muscle every 28 (twenty-eight) days. 09/27/18   Pucilowska, Braulio Conte B, MD  clindamycin (CLEOCIN) 150 MG capsule Take 2 capsules (300 mg total) by mouth 3 (three) times daily. 12/24/18   Kushi Kun, Roselyn Bering, PA-C  naloxone [facility]) nasal spray 4  mg/0.1 mL Use as directed 08/31/18   Pucilowska, Jolanta B, MD  traZODone (DESYREL) 100 MG tablet Take 1 tablet (100 mg total) by mouth at bedtime as needed for sleep. 08/31/18   Pucilowska, Braulio Conte B, MD  venlafaxine XR (EFFEXOR-XR) 150 MG 24 hr capsule Take 1 capsule (150 mg total) by mouth daily with breakfast. 09/01/18   Pucilowska, Ellin Goodie, MD    Allergies Mucomyst [acetylcysteine] and Vicodin [hydrocodone-acetaminophen]  No family history on file.  Social History Social History   Tobacco Use  . Smoking status: Current Every Day Smoker    Packs/day: 1.00    Years: 20.00    Pack years: 20.00    Types: Cigarettes  . Smokeless tobacco: Never Used  Substance Use Topics  . Alcohol use: Yes    Comment: beer and liquor, binges  . Drug use: Yes    Frequency: 1.0 times per week    Types: Marijuana, IV, Cocaine, Methamphetamines    Comment: heroin    Review of Systems  Constitutional: No fever/chills Eyes: No visual changes. ENT: No sore throat. Respiratory: Denies cough Genitourinary: Negative for dysuria. Musculoskeletal: Negative for back pain. Skin: Negative for rash.  Positive for abscess on the right thigh    ____________________________________________   PHYSICAL EXAM:  VITAL SIGNS: ED Triage Vitals  Enc Vitals Group     BP 12/24/18 1026 (!) 113/91     Pulse Rate 12/24/18 1026 83  Resp 12/24/18 1026 18     Temp 12/24/18 1026 98.4 F (36.9 C)     Temp Source 12/24/18 1026 Oral     SpO2 12/24/18 1026 99 %     Weight 12/24/18 1027 180 lb (81.6 kg)     Height 12/24/18 1027  (1.727 m)     Head Circumference --      Peak Flow --      Pain Score 12/24/18 1027 7     Pain Loc --      Pain Edu? --      Excl. in [place]? --     Constitutional: Alert and oriented. Well appearing and in no acute distress. Eyes: Conjunctivae are normal.  Head: Atraumatic. Nose: No congestion/rhinnorhea. Mouth/Throat: Mucous membranes are moist.   Neck:  supple no  lymphadenopathy noted Cardiovascular: Normal rate, regular rhythm. Heart sounds are normal Respiratory: Normal respiratory effort.  No retractions, lungs c t a  GU: deferred Musculoskeletal: FROM all extremities, warm and well perfused Neurologic:  Normal speech and language.  Skin:  Skin is warm, dry and intact.  Positive for large red area noted on the right thigh. Psychiatric: Mood and affect are normal. Speech and behavior are normal.  ____________________________________________   LABS (all labs ordered are listed, but only abnormal results are displayed)  Labs Reviewed  CBC WITH DIFFERENTIAL/PLATELET - Abnormal; Notable for the following components:      Result Value   WBC 17.8 (*)    Neutro Abs 12.3 (*)    Monocytes Absolute 1.7 (*)    Abs Immature Granulocytes 0.08 (*)    All other components within normal limits   ____________________________________________   ____________________________________________  RADIOLOGY  Ultrasound of the right thigh does not show any drainable focal lesion.  ____________________________________________   PROCEDURES  Procedure(s) performed: Saline lock, Zosyn IV   Procedures    ____________________________________________   INITIAL IMPRESSION / ASSESSMENT AND PLAN / ED COURSE  Pertinent labs & imaging results that were available during my care of the patient were reviewed by me and considered in my medical decision making (see chart for details).   Patient is 33 year old male presents emergency department complaining of right leg redness and swelling.  Physical exam shows a large circle of cellulitis noted on the dorsum of the right thigh.  Ultrasound of the right leg shows cellulitis but no focal drainable area.  Patient was given Zosyn IV.  CBC showed elevated WBC of 17.8.  He is to return in 1 to 2 days for recheck.  He was given prescription for clindamycin.  If he is worsening he is to return emergency department  immediately.  He states he understands and will comply.  Is discharged stable condition.     As part of my medical decision making, I reviewed the following data within the electronic MEDICAL RECORD NUMBER Nursing notes reviewed and incorporated, Labs reviewed CBC with elevated WBC of 17.8, Old chart reviewed, Radiograph reviewed ultrasound of the right thigh shows cellulitis, Notes from prior ED visits and [place] Controlled Substance Database  ____________________________________________   FINAL CLINICAL IMPRESSION(S) / ED DIAGNOSES  Final diagnoses:  Abscess  Cellulitis of right lower extremity      NEW MEDICATIONS STARTED DURING THIS VISIT:  Discharge Medication List as of 12/24/2018  1:56 PM    START taking these medications   Details  clindamycin (CLEOCIN) 150 MG capsule Take 2 capsules (300 mg total) by mouth 3 (three) times daily., Starting Mon 12/24/2018, Print  Note:  This document was prepared using Dragon voice recognition software and may include unintentional dictation errors.    Faythe Ghee, PA-C 12/24/18 1550    Rockne Menghini, MD 12/24/18 [phone]

## 2018-12-24 NOTE — ED Notes (Signed)
See triage note  Presents with pain to right upper leg area  Thinks he has an abscess to area   Noticed a small pimple area couple of days ago

## 2019-04-08 ENCOUNTER — Emergency Department
Admission: EM | Admit: 2019-04-08 | Discharge: 2019-04-08 | Disposition: A | Discharge status: Home / Self Care | Payer: Self-pay | Attending: Emergency Medicine | Admitting: Emergency Medicine

## 2019-04-08 ENCOUNTER — Encounter: Payer: Self-pay | Admitting: Emergency Medicine

## 2019-04-08 ENCOUNTER — Other Ambulatory Visit: Payer: Self-pay

## 2019-04-08 DIAGNOSIS — T40411A Poisoning by fentanyl or fentanyl analogs, accidental (unintentional), initial encounter: Secondary | ICD-10-CM

## 2019-04-08 DIAGNOSIS — T401X1A Poisoning by heroin, accidental (unintentional), initial encounter: Secondary | ICD-10-CM | POA: Insufficient documentation

## 2019-04-08 DIAGNOSIS — F17210 Nicotine dependence, cigarettes, uncomplicated: Secondary | ICD-10-CM | POA: Insufficient documentation

## 2019-04-08 DIAGNOSIS — Z79899 Other long term (current) drug therapy: Secondary | ICD-10-CM | POA: Insufficient documentation

## 2019-04-08 NOTE — ED Provider Notes (Addendum)
[facility] Emergency Department Provider Note  ____________________________________________  Time seen: Approximately 4:28 AM  I have reviewed the triage vital signs and the nursing notes.   HISTORY  Chief Complaint Drug Overdose   HPI Edwin [name] is a 33 y.o. male with a history of IV drug use who presents for an accidental overdose of fentanyl.  Patient reports history of heroin abuse.  Over the last 2 weeks has been using IV fentanyl.  Today he took "40 units" of IV fentanyl with his girlfriend.  EMS was called for respiratory arrest.  When they arrived patient was not breathing, patient did have a pulse.  He received 8 mg of intranasal Narcan with resolution of his respiratory arrest and return to baseline.  Patient denies any chest pain, headache, shortness of breath.  He does complain of generalized body pain.  Patient reports that the overdose was accidental and denies suicidal ideation.  Past Medical History:  Diagnosis Date  . Drug overdose   . DVT (deep venous thrombosis) (HCC)   . Hepatitis C     Patient Active Problem List   Diagnosis Date Noted  . Cannabis abuse   . Major depressive disorder, recurrent episode, severe (HCC) 08/11/2018  . Methamphetamine abuse (HCC)   . Cannabis use disorder, moderate, dependence (HCC)   . Tobacco use disorder 01/28/2016  . 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 Uncomplicated opioid dependence (HCC)   . Opiate or related narcotic overdose (HCC) 11/28/2015  . Substance abuse [facility](HCC)     Past Surgical History:  Procedure Laterality Date  . DENTAL SURGERY      Prior to Admission medications   Medication Sig Start Date End Date Taking? Authorizing Provider  ARIPiprazole (ABILIFY) 10 MG tablet Take 1 tablet (10 mg total) by mouth daily. 09/01/18   Pucilowska, Braulio ConteJolanta B, MD  ARIPiprazole ER (ABILIFY MAINTENA) 400 MG SRER injection Inject 2 mLs (400 mg  total) into the muscle every 28 (twenty-eight) days. 09/27/18   Pucilowska, Braulio ConteJolanta B, MD  clindamycin (CLEOCIN) 150 MG capsule Take 2 capsules (300 mg total) by mouth 3 (three) times daily. 12/24/18   Fisher, Roselyn BeringSusan W, PA-C  naloxone [facility](NARCAN) nasal spray 4 mg/0.1 mL Use as directed 08/31/18   Pucilowska, Jolanta B, MD  traZODone (DESYREL) 100 MG tablet Take 1 tablet (100 mg total) by mouth at bedtime as needed for sleep. 08/31/18   Pucilowska, Braulio ConteJolanta B, MD  venlafaxine XR (EFFEXOR-XR) 150 MG 24 hr capsule Take 1 capsule (150 mg total) by mouth daily with breakfast. 09/01/18   Pucilowska, Ellin GoodieJolanta B, MD    Allergies Mucomyst [acetylcysteine] and Vicodin [hydrocodone-acetaminophen]  No family history on file.  Social History Social History   Tobacco Use  . Smoking status: Current Every Day Smoker    Packs/day: 1.00    Years: 20.00    Pack years: 20.00    Types: Cigarettes  . Smokeless tobacco: Never Used  Substance Use Topics  . Alcohol use: Yes    Comment: beer and liquor, binges  . Drug use: Yes    Frequency: 1.0 times per week    Types: Marijuana, IV, Cocaine, Methamphetamines    Comment: heroin    Review of Systems  Constitutional: Negative for fever. + Generalized body pain Eyes: Negative for visual changes. ENT: Negative for sore throat. Neck: No neck pain  Cardiovascular: Negative for chest pain. Respiratory: + Respiratory arrest Gastrointestinal: Negative for abdominal pain, vomiting or diarrhea.  Genitourinary: Negative for dysuria. Musculoskeletal: Negative for back pain. Skin: Negative for rash. Neurological: Negative for headaches, weakness or numbness. Psych: No SI or HI  ____________________________________________   PHYSICAL EXAM:  VITAL SIGNS: ED Triage Vitals  Enc Vitals Group     BP 04/08/19 0428 (!) 136/93     Pulse Rate 04/08/19 0424 (!) 102     Resp 04/08/19 0424 14     Temp 04/08/19 0424 98.5 F (36.9 C)     Temp Source 04/08/19 0424 Oral      SpO2 04/08/19 0424 100 %     Weight 04/08/19 0423 170 lb (77.1 kg)     Height 04/08/19 0423 5\' 8"  (1.727 m)     Head Circumference --      Peak Flow --      Pain Score 04/08/19 0423 0     Pain Loc --      Pain Edu? --      Excl. in [place]? --     Constitutional: Alert and oriented, no distress HEENT:      Head: Normocephalic and atraumatic.         Eyes: Conjunctivae are normal. Sclera is non-icteric.       Mouth/Throat: Mucous membranes are moist.       Neck: Supple with no signs of meningismus. Cardiovascular: Tachycardic with regular rate and rhythm. No murmurs, gallops, or rubs.  Respiratory: Normal respiratory effort. Lungs are clear to auscultation bilaterally. No wheezes, crackles, or rhonchi.  Gastrointestinal: Soft, non tender, and non distended with positive bowel sounds. No rebound or guarding. Musculoskeletal: Nontender with normal range of motion in all extremities. No edema, cyanosis, or erythema of extremities. Neurologic: Normal speech and language. Face is symmetric. Moving all extremities. No gross focal neurologic deficits are appreciated. Skin: Skin is warm, dry and intact. No rash noted. Track marks on arms Psychiatric: Mood and affect are normal. Speech and behavior are normal.  ____________________________________________   LABS (all labs ordered are listed, but only abnormal results are displayed)  Labs Reviewed - No data to display ____________________________________________  EKG  ED ECG REPORT I, Rudene Re, the attending physician, personally viewed and interpreted this ECG.  Sinus tachycardia, rate of 100, normal intervals, normal axis, no ST elevations or depressions.  Normal EKG. ____________________________________________  RADIOLOGY  none  ____________________________________________   PROCEDURES  Procedure(s) performed: None Procedures Critical Care performed:  None ____________________________________________   INITIAL  IMPRESSION / ASSESSMENT AND PLAN / ED COURSE   32 y.o. male with a history of IV drug use who presents for an accidental overdose of fentanyl requiring narcan per EMS.  Patient arrives alert and oriented x3 with a GCS of 15.  Will monitor for need to re-dose Narcan.  No indication for IVC.    _________________________ 6:27 AM on 04/08/2019 -----------------------------------------  Patient monitored for more than 2 hours with no need to re-dose Narcan.  Will discharge home with a sober ride.   As part of my medical decision making, I reviewed the following data within the [place] notes reviewed and incorporated, EKG interpreted , Old chart reviewed, Notes from prior ED visits and [place] Controlled Substance Database    Pertinent labs & imaging results that were available during my care of the patient were reviewed by me and considered in my medical decision making (see chart for details).    ____________________________________________   FINAL CLINICAL IMPRESSION(S) / ED DIAGNOSES  Final diagnoses:  Accidental fentanyl overdose, initial encounter (  HCC)      NEW MEDICATIONS STARTED DURING THIS VISIT:  ED Discharge Orders    None       Note:  This document was prepared using Dragon voice recognition software and may include unintentional dictation errors.    Don PerkingVeronese, [place], MD 04/08/19 Mallie [id number]    Nita SickleVeronese, [place], MD 04/08/19 [phone]

## 2019-04-08 NOTE — ED Triage Notes (Signed)
Patient to ED via EMS due to overdose of "40 units of Fentanyl". Unable to say how many Mcg that might be. Girlfriend called EMS but she was reportedly tweeking on something as well.

## 2019-04-22 ENCOUNTER — Other Ambulatory Visit: Payer: Self-pay

## 2019-04-22 ENCOUNTER — Emergency Department
Admission: EM | Admit: 2019-04-22 | Discharge: 2019-04-22 | Disposition: A | Discharge status: Home / Self Care | Payer: Self-pay | Attending: Emergency Medicine | Admitting: Emergency Medicine

## 2019-04-22 DIAGNOSIS — F17210 Nicotine dependence, cigarettes, uncomplicated: Secondary | ICD-10-CM | POA: Insufficient documentation

## 2019-04-22 DIAGNOSIS — L0291 Cutaneous abscess, unspecified: Secondary | ICD-10-CM

## 2019-04-22 DIAGNOSIS — L02416 Cutaneous abscess of left lower limb: Secondary | ICD-10-CM | POA: Insufficient documentation

## 2019-04-22 DIAGNOSIS — F1910 Other psychoactive substance abuse, uncomplicated: Secondary | ICD-10-CM | POA: Insufficient documentation

## 2019-04-22 LAB — CBC WITH DIFFERENTIAL/PLATELET
Abs Immature Granulocytes: 0.03 10*3/uL (ref 0.00–0.07)
Basophils Absolute: 0.1 10*3/uL (ref 0.0–0.1)
Basophils Relative: 0 %
Eosinophils Absolute: 0.3 10*3/uL (ref 0.0–0.5)
Eosinophils Relative: 3 %
HCT: 43.1 % (ref 39.0–52.0)
Hemoglobin: 14.8 g/dL (ref 13.0–17.0)
Immature Granulocytes: 0 %
Lymphocytes Relative: 19 %
Lymphs Abs: 2.5 10*3/uL (ref 0.7–4.0)
MCH: 31 pg (ref 26.0–34.0)
MCHC: 34.3 g/dL (ref 30.0–36.0)
MCV: 90.2 fL (ref 80.0–100.0)
Monocytes Absolute: 1.8 10*3/uL — ABNORMAL HIGH (ref 0.1–1.0)
Monocytes Relative: 13 %
Neutro Abs: 8.7 10*3/uL — ABNORMAL HIGH (ref 1.7–7.7)
Neutrophils Relative %: 65 %
Platelets: 303 10*3/uL (ref 150–400)
RBC: 4.78 MIL/uL (ref 4.22–5.81)
RDW: 12.8 % (ref 11.5–15.5)
WBC: 13.4 10*3/uL — ABNORMAL HIGH (ref 4.0–10.5)
nRBC: 0 % (ref 0.0–0.2)

## 2019-04-22 LAB — COMPREHENSIVE METABOLIC PANEL
ALT: 13 U/L (ref 0–44)
AST: 14 U/L — ABNORMAL LOW (ref 15–41)
Albumin: 3.9 g/dL (ref 3.5–5.0)
Alkaline Phosphatase: 66 U/L (ref 38–126)
Anion gap: 9 (ref 5–15)
BUN: 17 mg/dL (ref 6–20)
CO2: 29 mmol/L (ref 22–32)
Calcium: 8.6 mg/dL — ABNORMAL LOW (ref 8.9–10.3)
Chloride: 99 mmol/L (ref 98–111)
Creatinine, Ser: 0.79 mg/dL (ref 0.61–1.24)
GFR calc Af Amer: >60 mL/min (ref >60)
GFR calc non Af Amer: >60 mL/min (ref >60)
Glucose, Bld: 57 mg/dL — ABNORMAL LOW (ref 70–99)
Potassium: 4.2 mmol/L (ref 3.5–5.1)
Sodium: 137 mmol/L (ref 135–145)
Total Bilirubin: 0.3 mg/dL (ref 0.3–1.2)
Total Protein: 7.2 g/dL (ref 6.5–8.1)

## 2019-04-22 MED ORDER — PIPERACILLIN-TAZOBACTAM 3.375 G IVPB 30 MIN
3.3750 g | Freq: Once | INTRAVENOUS | Status: DC
  Filled 2019-04-22: qty 50

## 2019-04-22 MED ORDER — SULFAMETHOXAZOLE-TRIMETHOPRIM 800-160 MG PO TABS: 1.0000 | ORAL_TABLET | Freq: Two times a day (BID) | ORAL | 0 refills | Status: DC

## 2019-04-22 MED ORDER — CEPHALEXIN 500 MG PO CAPS: 500.0000 mg | ORAL_CAPSULE | Freq: Three times a day (TID) | ORAL | 0 refills | Status: AC

## 2019-04-22 NOTE — ED Provider Notes (Signed)
[facility] Emergency Department Provider Note  ____________________________________________   First MD Initiated Contact with Patient 04/22/19 1816     (approximate)  I have reviewed the triage vital signs and the nursing notes.   HISTORY  Chief Complaint Abscess    HPI Edwin [name] is a 33 y.o. male presents emergency department with cellulitis to left upper leg.  States he noticed the area couple of days ago.  Started to drain earlier today.  He states he only shoots up in his arms not his legs.  He denies any fever or chills.  No chest pain or shortness of breath.  Patient has history of hep C and polysubstance abuse.    Past Medical History:  Diagnosis Date  . Drug overdose   . DVT (deep venous thrombosis) ([place])   . Hepatitis C     Patient Active Problem List   Diagnosis Date Noted  . Cannabis abuse   . Major depressive disorder, recurrent episode, severe ([place]) 08/11/2018  . Methamphetamine abuse ([place])   . Cannabis use disorder, moderate, dependence ([place])   . Tobacco use disorder 01/28/2016  . Major depressive disorder, recurrent severe without psychotic features ([place])   . Uncomplicated opioid dependence ([place])   . Opiate or related narcotic overdose ([place]) 11/28/2015  . Substance abuse [facility])     Past Surgical History:  Procedure Laterality Date  . DENTAL SURGERY      Prior to Admission medications   Medication Sig Start Date End Date Taking? Authorizing Provider  ARIPiprazole (ABILIFY) 10 MG tablet Take 1 tablet (10 mg total) by mouth daily. 09/01/18   Pucilowska, Herma Ard B, MD  ARIPiprazole ER (ABILIFY MAINTENA) 400 MG SRER injection Inject 2 mLs (400 mg total) into the muscle every 28 (twenty-eight) days. 09/27/18   Pucilowska, Herma Ard B, MD  cephALEXin (KEFLEX) 500 MG capsule Take 1 capsule (500 mg total) by mouth 3 (three) times daily for 10 days. 04/22/19 05/02/19  Caryn Section, Linden Dolin, PA-C  naloxone [facility]) nasal spray 4 mg/0.1  mL Use as directed 08/31/18   Pucilowska, Jolanta B, MD  sulfamethoxazole-trimethoprim (BACTRIM DS) 800-160 MG tablet Take 1 tablet by mouth 2 (two) times daily. 04/22/19   Gabrille Kilbride, Linden Dolin, PA-C  traZODone (DESYREL) 100 MG tablet Take 1 tablet (100 mg total) by mouth at bedtime as needed for sleep. 08/31/18   Pucilowska, Herma Ard B, MD  venlafaxine XR (EFFEXOR-XR) 150 MG 24 hr capsule Take 1 capsule (150 mg total) by mouth daily with breakfast. 09/01/18   Pucilowska, Wardell Honour, MD    Allergies Mucomyst [acetylcysteine] and Vicodin [hydrocodone-acetaminophen]  No family history on file.  Social History Social History   Tobacco Use  . Smoking status: Current Every Day Smoker    Packs/day: 1.00    Years: 20.00    Pack years: 20.00    Types: Cigarettes  . Smokeless tobacco: Never Used  Substance Use Topics  . Alcohol use: Yes    Comment: beer and liquor, binges  . Drug use: Yes    Frequency: 1.0 times per week    Types: Marijuana, IV, Cocaine, Methamphetamines    Comment: heroin    Review of Systems  Constitutional: No fever/chills Eyes: No visual changes. ENT: No sore throat. Respiratory: Denies cough Genitourinary: Negative for dysuria. Musculoskeletal: Negative for back pain.  Positive for large red area noted on the left thigh Skin: Negative for rash.  Questionable abscess to the left thigh    ____________________________________________   PHYSICAL EXAM:  VITAL SIGNS: ED Triage Vitals  Enc Vitals Group     BP 04/22/19 1806 136/81     Pulse Rate 04/22/19 1806 84     Resp 04/22/19 1806 17     Temp 04/22/19 1806 98.4 F (36.9 C)     Temp Source 04/22/19 1806 Oral     SpO2 04/22/19 1806 100 %     Weight 04/22/19 1807 170 lb (77.1 kg)     Height 04/22/19 1807 _0  (1.727 m)     Head Circumference --      Peak Flow --      Pain Score 04/22/19 1807 5     Pain Loc --      Pain Edu? --      Excl. in [place]? --     Constitutional: Alert and oriented. Well appearing  and in no acute distress. Eyes: Conjunctivae are normal.  Head: Atraumatic. Nose: No congestion/rhinnorhea. Mouth/Throat: Mucous membranes are moist.   Neck:  supple no lymphadenopathy noted Cardiovascular: Normal rate, regular rhythm. Heart sounds are normal Respiratory: Normal respiratory effort.  No retractions, lungs c t a  GU: deferred Musculoskeletal: FROM all extremities, warm and well perfused, left thigh has a large red area approximately 12 inches x 5 inches, area in the center is oozing and draining Neurologic:  Normal speech and language.  Skin:  Skin is warm, ,'s abscess/cellulitis noted to the left upper thigh Psychiatric: Mood and affect are normal. Speech and behavior are normal.  ____________________________________________   LABS (all labs ordered are listed, but only abnormal results are displayed)  Labs Reviewed  CBC WITH DIFFERENTIAL/PLATELET - Abnormal; Notable for the following components:      Result Value   WBC 13.4 (*)    Neutro Abs 8.7 (*)    Monocytes Absolute 1.8 (*)    All other components within normal limits  COMPREHENSIVE METABOLIC PANEL - Abnormal; Notable for the following components:   Glucose, Bld 57 (*)    Calcium 8.6 (*)    AST 14 (*)    All other components within normal limits  AEROBIC CULTURE (SUPERFICIAL SPECIMEN)   ____________________________________________   ____________________________________________  RADIOLOGY    ____________________________________________   PROCEDURES  Procedure(s) performed: Saline lock, Zosyn IV   Procedures    ____________________________________________   INITIAL IMPRESSION / ASSESSMENT AND PLAN / ED COURSE  Pertinent labs & imaging results that were available during my care of the patient were reviewed by me and considered in my medical decision making (see chart for details).   Patient is 33 year old male presents emergency department with concerns of an abscess in the left thigh.   Physical exam shows patient to appear well.  Large cellulitis noted on the left upper leg.  Has an area that is oozing and draining bloody pus.  CBC, met C, wound culture ordered Due to the IV drug use, Zosyn IV ordered    ----------------------------------------- 7:52 PM on 04/22/2019 -----------------------------------------  CBC shows elevated WBC at 13.4, comprehensive metabolic panel is basically normal.  Explained the findings to the patient.  He was given a prescription for Septra and Keflex.  He is to take medications as prescribed.  Return emergency department worsening.  Explained to him if he develops a fever, chest pain or shortness of breath he needs to return.  due to the IV drug use the area could have a deep abscess.  The patient still denies injecting into his thigh.  He was discharged in stable condition.  As  part of my medical decision making, I reviewed the following data within the [place] notes reviewed and incorporated, Labs reviewed CBC comprehensive metabolic panel reviewed, WBC elevated 13.4, Old chart reviewed, Notes from prior ED visits and [place] Controlled Substance Database  ____________________________________________   FINAL CLINICAL IMPRESSION(S) / ED DIAGNOSES  Final diagnoses:  Abscess      NEW MEDICATIONS STARTED DURING THIS VISIT:  New Prescriptions   CEPHALEXIN (KEFLEX) 500 MG CAPSULE    Take 1 capsule (500 mg total) by mouth 3 (three) times daily for 10 days.   SULFAMETHOXAZOLE-TRIMETHOPRIM (BACTRIM DS) 800-160 MG TABLET    Take 1 tablet by mouth 2 (two) times daily.     Note:  This document was prepared using Dragon voice recognition software and may include unintentional dictation errors.    Versie Starks, PA-C 04/22/19 1953    Nena Polio, MD 04/22/19 2112

## 2019-04-22 NOTE — Discharge Instructions (Addendum)
Follow-up with your regular doctor if not better in 3 days.  Return emergency department worsening.  Take medications as prescribed. 

## 2019-04-22 NOTE — ED Triage Notes (Signed)
Pt c/o abscess to the left upper leg for the past 5 days, states has had some drainage.

## 2019-04-22 NOTE — ED Notes (Signed)
See triage note  Presents with possible cellulitis to left upper leg  States he noticed area couple of days ago

## 2019-04-25 LAB — AEROBIC CULTURE W GRAM STAIN (SUPERFICIAL SPECIMEN)

## 2019-04-25 LAB — AEROBIC CULTURE? (SUPERFICIAL SPECIMEN)

## 2019-09-03 ENCOUNTER — Encounter (HOSPITAL_COMMUNITY): Payer: Self-pay | Admitting: Emergency Medicine

## 2019-09-03 ENCOUNTER — Other Ambulatory Visit: Payer: Self-pay

## 2019-09-03 ENCOUNTER — Emergency Department (HOSPITAL_COMMUNITY)
Admission: EM | Admit: 2019-09-03 | Discharge: 2019-09-03 | Disposition: A | Discharge status: Home / Self Care | Payer: Self-pay | Attending: Emergency Medicine | Admitting: Emergency Medicine

## 2019-09-03 DIAGNOSIS — L03114 Cellulitis of left upper limb: Secondary | ICD-10-CM | POA: Insufficient documentation

## 2019-09-03 DIAGNOSIS — Z79899 Other long term (current) drug therapy: Secondary | ICD-10-CM | POA: Insufficient documentation

## 2019-09-03 DIAGNOSIS — F17210 Nicotine dependence, cigarettes, uncomplicated: Secondary | ICD-10-CM | POA: Insufficient documentation

## 2019-09-03 MED ORDER — DOXYCYCLINE HYCLATE 100 MG PO CAPS: 100.0000 mg | ORAL_CAPSULE | Freq: Two times a day (BID) | ORAL | 0 refills | Status: DC

## 2019-09-03 MED ORDER — DOXYCYCLINE HYCLATE 100 MG PO TABS
100.0000 mg | ORAL_TABLET | Freq: Once | ORAL | Status: AC
  Administered 2019-09-03: 100 mg via ORAL
  Filled 2019-09-03: qty 1

## 2019-09-03 NOTE — ED Provider Notes (Signed)
[facility] Provider Note   CSN: [id number] Arrival date & time: 09/03/19  2116     History   Chief Complaint Chief Complaint  Patient presents with  . Abscess    HPI Edwin [name] is a 33 y.o. male.     HPI  This patient is a 33 year old male, has a history of drug use, states that he shoots heroin, he also has a history of methamphetamine use and recurrent subcutaneous abscesses.  He reports that approximately 4 days ago he noticed a pimple on his elbow, he tried to pop it and since that time has had progressive redness swelling warmth and tenderness to the skin.  There is a spreading redness that is expanding down his forearm.  He denies fevers or chills, states that he last used IV drugs prior to arriving today.  His symptoms are persistent, gradually worsening, associated with mild tenderness.  Past Medical History:  Diagnosis Date  . Drug overdose   . DVT (deep venous thrombosis) (HCC)   . Hepatitis C     Patient Active Problem List   Diagnosis Date Noted  . Cannabis abuse   . Major depressive disorder, recurrent episode, severe (HCC) 08/11/2018  . Methamphetamine abuse (HCC)   . Cannabis use disorder, moderate, dependence (HCC)   . Tobacco use disorder 01/28/2016  . 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 Uncomplicated opioid dependence (HCC)   . Opiate or related narcotic overdose (HCC) 11/28/2015  . Substance abuse [facility](HCC)     Past Surgical History:  Procedure Laterality Date  . DENTAL SURGERY          Home Medications    Prior to Admission medications   Medication Sig Start Date End Date Taking? Authorizing Provider  ARIPiprazole (ABILIFY) 10 MG tablet Take 1 tablet (10 mg total) by mouth daily. 09/01/18   Pucilowska, Braulio ConteJolanta B, MD  ARIPiprazole ER (ABILIFY MAINTENA) 400 MG SRER injection Inject 2 mLs (400 mg total) into the muscle every 28 (twenty-eight) days. 09/27/18   Pucilowska,  Braulio ConteJolanta B, MD  doxycycline (VIBRAMYCIN) 100 MG capsule Take 1 capsule (100 mg total) by mouth 2 (two) times daily. 09/03/19   Eber HongMiller, Lashawnna Lambrecht, MD  naloxone [facility](NARCAN) nasal spray 4 mg/0.1 mL Use as directed 08/31/18   Pucilowska, Jolanta B, MD  sulfamethoxazole-trimethoprim (BACTRIM DS) 800-160 MG tablet Take 1 tablet by mouth 2 (two) times daily. 04/22/19   Fisher, Roselyn BeringSusan W, PA-C  traZODone (DESYREL) 100 MG tablet Take 1 tablet (100 mg total) by mouth at bedtime as needed for sleep. 08/31/18   Pucilowska, Ellin GoodieJolanta B, MD  venlafaxine XR (EFFEXOR-XR) 150 MG 24 hr capsule Take 1 capsule (150 mg total) by mouth daily with breakfast. 09/01/18   Pucilowska, Ellin GoodieJolanta B, MD    Family History No family history on file.  Social History Social History   Tobacco Use  . Smoking status: Current Every Day Smoker    Packs/day: 1.00    Years: 20.00    Pack years: 20.00    Types: Cigarettes  . Smokeless tobacco: Never Used  Substance Use Topics  . Alcohol use: Yes    Comment: beer and liquor, binges  . Drug use: Yes    Frequency: 1.0 times per week    Types: Marijuana, IV, Cocaine, Methamphetamines    Comment: heroin     Allergies   Mucomyst [acetylcysteine] and Vicodin [hydrocodone-acetaminophen]   Review of Systems Review of Systems  All other systems reviewed  and are negative.    Physical Exam Updated Vital Signs BP 114/89 (BP Location: Right Arm)   Pulse 92   Temp 98.3 F (36.8 C) (Oral)   Resp 20   Ht 1.727 m (5\' 8" )   Wt 82.6 kg   SpO2 98%   BMI 27.67 kg/m   Physical Exam Vitals signs and nursing note reviewed.  Constitutional:      General: He is not in acute distress.    Appearance: He is well-developed.  HENT:     Head: Normocephalic and atraumatic.     Mouth/Throat:     Pharynx: No oropharyngeal exudate.  Eyes:     General: No scleral icterus.       Right eye: No discharge.        Left eye: No discharge.     Conjunctiva/sclera: Conjunctivae normal.     Pupils: Pupils  are equal, round, and reactive to light.  Neck:     Musculoskeletal: Normal range of motion and neck supple.     Thyroid: No thyromegaly.     Vascular: No JVD.  Cardiovascular:     Rate and Rhythm: Normal rate and regular rhythm.     Heart sounds: Normal heart sounds. No murmur. No friction rub. No gallop.   Pulmonary:     Effort: Pulmonary effort is normal. No respiratory distress.     Breath sounds: Normal breath sounds. No wheezing or rales.  Abdominal:     General: Bowel sounds are normal. There is no distension.     Palpations: Abdomen is soft. There is no mass.     Tenderness: There is no abdominal tenderness.  Musculoskeletal: Normal range of motion.        General: Swelling present. No tenderness.     Comments: The patient is able to fully range of motion his left elbow.  He can flex and extend fully without significant tenderness.  There is a fullness to the extensor surface of the forearm from the mid forearm up towards the elbow.  There is no induration, there is no fluctuance, there is a small opening where there is a tiny amount of purulence that is expressed.  There is no significant tenderness here.  He has normal pulses at the radial pulse.  Lymphadenopathy:     Cervical: No cervical adenopathy.  Skin:    General: Skin is warm and dry.     Findings: Erythema and rash present.     Comments: Erythematous rash extending on the forearm from just above the wrist to the elbow.  It does not extend above the elbow.  Neurological:     Mental Status: He is alert.     Coordination: Coordination normal.  Psychiatric:        Behavior: Behavior normal.      ED Treatments / Results  Labs (all labs ordered are listed, but only abnormal results are displayed) Labs Reviewed - No data to display  EKG None  Radiology No results found.  Procedures Procedures (including critical care time)  Medications Ordered in ED Medications  doxycycline (VIBRA-TABS) tablet 100 mg (has  no administration in time range)     Initial Impression / Assessment and Plan / ED Course  I have reviewed the triage vital signs and the nursing notes.  Pertinent labs & imaging results that were available during my care of the patient were reviewed by me and considered in my medical decision making (see chart for details).  The patient very clearly has a cellulitis of the arm, there is no tenderness, he is able to fully range his joint.  This appears to be a soft tissue infection.  He refuses IV antibiotics despite my suggestion given the size of the cellulitis.  He is amenable to taking doxycycline so this will be prescribed.  He is asking for help with substance abuse as far as methadone clinic, I will give him phone numbers and resources, he is grateful and appreciative and reports he will return should his symptoms worsen.  A black line has been drawn around the cellulitis prior to discharge.  Final Clinical Impressions(s) / ED Diagnoses   Final diagnoses:  Cellulitis of left elbow    ED Discharge Orders         Ordered    doxycycline (VIBRAMYCIN) 100 MG capsule  2 times daily     09/03/19 2219           Noemi Chapel, MD 09/03/19 2222

## 2019-09-03 NOTE — Discharge Instructions (Signed)
I have offered you IV antibiotics but you have declined.  You may return at any time should you develop worsening swelling redness fevers or increasing pain.  If you change your mind you may also come back.  We have drawn a black line around the infection, if it continues to spread more aggressively should come back immediately.  Please see the list of phone numbers below for follow-up in the community with a family doctor and the resource list for follow-up with local substance abuse treatment.  Please stop using IV drugs, take the doxycycline twice daily for 10 days, Tylenol or ibuprofen for fever or pain.  [facility] Primary Care Doctor List    Sinda Du MD. Specialty: Pulmonary Disease Contact information: [place] [address]  [phone]   Tula Nakayama, MD. Specialty: [facility] Medicine Contact information: [address] [place] [address]  [phone]   Sallee Lange, MD. Specialty: [facility] Medicine Contact information: [place]  [place] [address]  [phone]   Rosita Fire, MD Specialty: Internal Medicine Contact information: [place] [address]  [phone]   Delphina Cahill, MD. Specialty: Internal Medicine Contact information: [place] [address]  [phone]    [facility] (Dr. Maudie Mercury) Specialty: Family Medicine Contact information: [place] [address]  [phone]   Leslie Andrea, MD. Specialty: [facility] Medicine Contact information: [place] [address]  [phone]   Asencion Noble, MD. Specialty: Internal Medicine Contact information: [place] [address]  [place] [address]  [place]  [address] [place], [place] [address] [phone]  Services [facility] offers a variety of  basic health services.  Services include but are not limited to: Blood pressure checks  Heart rate checks  Blood sugar checks  Urine analysis  Rapid strep tests  Pregnancy tests.  Health education and referrals  People needing more complex services will be directed to a physician online. Using these virtual visits, doctors can evaluate and prescribe medicine and treatments. There will be no medication on-site, though [place] [facility] will help patients fill their prescriptions at little to no cost.   For More information please go to: [email or web address]   Substance Abuse Treatment Programs  Intensive Outpatient Programs [facility]     [place]. [place], [place]       [facility] [place] [address] [place], [place]  [place] [facility]     (Inpatient and outpatient)     [address]           [place] [phone] (Suboxone and Methadone)  [place], [place] [address]      [phone]       [address] [place], [place]  [facility] [place] [facility] ([place], [employer])    (insurance only) [phone]             [facility] ([place]) [place], [place]     [facility]     [address]     [place], [place]      [phone]       [facility] (for caregivers and family) [phone] [address] [place], [place] [phone]      Residential Treatment Programs [facility]      [address], [place], [place] [address]  [phone]       [facility] [address] [place], [place] [address] [phone]  [facility]        [phone]       [facility] [phone]  [facility] ([facility]             [address]                                          [place], [place]                                                [phone] or [phone]                               [facility] [place] [address] [place], [phone]  [facility]    [address]      [place], [place]     [phone]       [facility] [address] [place], [place] [phone]  [facility]   [address]     [place], [place] [address]     [phone]      Admissions: 8am-3pm M-F  [facility] Services (RTS) [address] [place], [place] [phone]  [facility] Program: Residential Program [phone](90 Days)   [place], [place]      [phone] or [phone]     ADATC: [facility] [place], [place] (Walk in Hours over the weekend or by referral)  [facility] [address] [place], [place], [place] [address] [phone]  Crisis Mobile: [facility]:  [phone] (for crisis response 24 hours a day) [facility] Hotline:      [phone] Outpatient Psychiatry and Counseling  [facility]: Mobile Crisis Management 24 hours:  [phone]  [facility] the [facility] sliding scale fee and walk in schedule: M-F 8am-12pm/1pm-3pm [address]  [place], [place] [address] [phone]  [facility] [address] [place], [place] [address] [phone]  [facility] (Formerly known as [facility])- new patient walk-in appointments available Monday - Friday 8am -3pm.          [address] [place], [place] [address] [phone] or crisis line- [phone]  [facility] Out[facility] Services/ Intensive Outpatient Therapy Program [address] [place], [place] [address] [phone]  [facility]                  Crisis Services      [phone]      [address]     [place], [place] [address]                  [place] [facility]   [facility] [phone] [address]  [place], [place] [address]   [employer] of Care          [address] Laura  [place], [place] [address]       [phone]  [facility] [address] [place], [place] [address] [phone]  [facility]    [address]    [place], [place] [address]     [phone]       Andee Poles, MD     [address]     [place] [address]     [phone]       [facility] [address] [place] [address]  [facility]     [address] [place], [place]      [phone]       [facility] Eulogio Ditch, MD [address] [place], [place] [address] [phone]  [facility]     [address]     [place], [place] [address]     [phone]       [facility] [address] [place], [place] [address] [phone] Resources for Temporary Residential Assistance/Crisis Centers  [facility] CENT[facility]) M-F 8am-3pm   [address] [place], [place] [address]   [phone] Services include: laundry, barbering, support groups, case management, phone  & computer access, showers, AA/NA mtgs, mental health/substance abuse nurse, job skills class, disability information, VA assistance, spiritual classes, etc.   HOMELESS SHELTERS  [facility]     [facility]   [address] [place]     [phone]              [employer] (women and children)       [address] [place], [place] [address] [phone] [email or web address] for application and process Application Required  [facility] [employer] [facility]   [address]    [place] [address]     [phone]                    [facility] [address] [place], [place] [address] [phone] application appt.) Application Required  [facility] (women only)    [address]     [place], [place] [address]     [phone]      Intake starts 6pm daily Need valid ID, SSC, & Police report [employer] [address] [place], [place] [phone] Application Required  [employer] (men only)     [address]      [place], [place]     [phone]       [facility] (Pregnant women only) [address] [place], [place] [phone]  [facility]      [address]      [place], [place] [address]     [phone]             [facility] [address] [place], [place] [phone] 90 day commitment/SA/Application process  [facility] [place] [facility](men only)     [address]     [place], [place]     [phone]       Check-in at [employer] of [place]  [address] [place], [place] [address] [phone] Men/Women/Women and Children must be there by 7 pm  [facility] [place], [place] [phone]

## 2019-09-03 NOTE — ED Triage Notes (Signed)
Pt C/O abscess to the left elbow that started Friday night. Denies fevers.

## 2019-09-05 ENCOUNTER — Other Ambulatory Visit: Payer: Self-pay

## 2019-09-05 ENCOUNTER — Emergency Department (HOSPITAL_COMMUNITY): Admission: EM | Admit: 2019-09-05 | Discharge: 2019-09-05 | Discharge status: Against Medical Advice | Payer: Self-pay

## 2019-09-05 NOTE — ED Notes (Signed)
No answer in waiting areas.

## 2019-09-05 NOTE — ED Notes (Signed)
Called for Pt in waiting room. No answer.

## 2020-01-10 ENCOUNTER — Other Ambulatory Visit: Payer: Self-pay

## 2020-01-10 ENCOUNTER — Emergency Department
Admission: EM | Admit: 2020-01-10 | Discharge: 2020-01-10 | Discharge status: Against Medical Advice | Payer: Self-pay | Attending: Emergency Medicine | Admitting: Emergency Medicine

## 2020-01-10 ENCOUNTER — Encounter: Payer: Self-pay | Admitting: Emergency Medicine

## 2020-01-10 DIAGNOSIS — T401X1A Poisoning by heroin, accidental (unintentional), initial encounter: Secondary | ICD-10-CM | POA: Insufficient documentation

## 2020-01-10 DIAGNOSIS — R0789 Other chest pain: Secondary | ICD-10-CM | POA: Insufficient documentation

## 2020-01-10 DIAGNOSIS — F17210 Nicotine dependence, cigarettes, uncomplicated: Secondary | ICD-10-CM | POA: Insufficient documentation

## 2020-01-10 MED ORDER — NALOXONE HCL 4 MG/0.1ML NA LIQD
1.0000 | Freq: Once | NASAL | Status: AC
  Administered 2020-01-10: 1 via NASAL
  Filled 2020-01-10: qty 4

## 2020-01-10 NOTE — Discharge Instructions (Addendum)

## 2020-01-10 NOTE — ED Triage Notes (Signed)
Pt presents to ED via [facility] from [facility]. Per EMS pt overdosed on unknown amount of heroin, pt went to needle exchange program and received narcan prior to EMS arrival, EMS reports pt had narcan in vials, unsure of dosage in vials. Per EMS pt has had multiple drug overdoses in the last few weeks.   Pt reports he has overdosed every day for the last few weeks. Pt reports bystander CPR performed prior to EMS arrival, EMS reports that patient A&O x4, pt arrives to ED A&O x4, NAD noted at this time.

## 2020-01-10 NOTE — ED Notes (Signed)
Pt refusing bloodwork at this time, states "I've shot up so many times today, meth heroin, all that, I don't need to be poked again".

## 2020-01-10 NOTE — ED Provider Notes (Signed)
[facility] Emergency Department Provider Note   ____________________________________________   First MD Initiated Contact with Patient 01/10/20 1501     (approximate)  I have reviewed the triage vital signs and the nursing notes.   HISTORY  Chief Complaint Drug Overdose    HPI Edwin [name] is a 34 y.o. male here for evaluation of a drug overdose  Patient reports that he was using heroin, also used methamphetamine a little bit earlier in the day.  Shot heroin and overdose.  Patient received  bystander CPR also received Narcan.  Patient awake now, fully alert.  Tells me that he has been using drugs daily since he left prison.  He was confined for over 100 days in prison, got out and now started using heroin once again.  Patient tells me that he knows he can die from it.  He is not trying to hurt himself.  He is using recreationally.  Denies any desire to hurt himself or anyone else  Patient reports that he is overdosed almost daily for the last couple of weeks.  He has resources and he is been in contact with the needle exchange program been providing him fresh needles and naloxone  Denies any chest pain or trouble breathing.  Reports he is just feels a little sore maybe over his chest from the CPR he got.  No trouble breathing.  Patient reports that he is about ready to go home, but is interested in at least hearing about some treatment resources  Had a very frank discussion with him, and he clearly acknowledges the risk of his heroin use and its risk of death.  But he also clearly states that he does not want to hurt himself or anyone else.  He is using recreationally.  He keeps naloxone with him, and always uses with a friend or someone who can administer it if he overdoses.  Past Medical History:  Diagnosis Date  . Drug overdose   . DVT (deep venous thrombosis) ([place])   . Hepatitis C     Patient Active Problem List   Diagnosis Date  Noted  . Cannabis abuse   . Major depressive disorder, recurrent episode, severe ([place]) 08/11/2018  . Methamphetamine abuse ([place])   . Cannabis use disorder, moderate, dependence ([place])   . Tobacco use disorder 01/28/2016  . Major depressive disorder, recurrent severe without psychotic features ([place])   . Uncomplicated opioid dependence ([place])   . Opiate or related narcotic overdose ([place]) 11/28/2015  . Substance abuse [facility])     Past Surgical History:  Procedure Laterality Date  . DENTAL SURGERY      Prior to Admission medications   Medication Sig Start Date End Date Taking? Authorizing Provider  ARIPiprazole (ABILIFY) 10 MG tablet Take 1 tablet (10 mg total) by mouth daily. 09/01/18   Pucilowska, Herma Ard B, MD  ARIPiprazole ER (ABILIFY MAINTENA) 400 MG SRER injection Inject 2 mLs (400 mg total) into the muscle every 28 (twenty-eight) days. 09/27/18   Pucilowska, Herma Ard B, MD  doxycycline (VIBRAMYCIN) 100 MG capsule Take 1 capsule (100 mg total) by mouth 2 (two) times daily. 09/03/19   Noemi Chapel, MD  naloxone [facility]) nasal spray 4 mg/0.1 mL Use as directed 08/31/18   Pucilowska, Jolanta B, MD  sulfamethoxazole-trimethoprim (BACTRIM DS) 800-160 MG tablet Take 1 tablet by mouth 2 (two) times daily. 04/22/19   Fisher, Linden Dolin, PA-C  traZODone (DESYREL) 100 MG tablet Take 1 tablet (100 mg total) by mouth at  bedtime as needed for sleep. 08/31/18   Pucilowska, Herma Ard B, MD  venlafaxine XR (EFFEXOR-XR) 150 MG 24 hr capsule Take 1 capsule (150 mg total) by mouth daily with breakfast. 09/01/18   Pucilowska, Wardell Honour, MD    Allergies Mucomyst [acetylcysteine] and Vicodin [hydrocodone-acetaminophen]  No family history on file.  Social History Social History   Tobacco Use  . Smoking status: Current Every Day Smoker    Packs/day: 1.00    Years: 20.00    Pack years: 20.00    Types: Cigarettes  . Smokeless tobacco: Never Used  Substance Use Topics  . Alcohol use: Yes    Comment: beer and  liquor, binges  . Drug use: Yes    Frequency: 1.0 times per week    Types: Marijuana, IV, Cocaine, Methamphetamines    Comment: heroin    Review of Systems Constitutional: No fever/chills or recent illness Eyes: No visual changes. ENT: No sore throat. Cardiovascular: Denies chest pain.  Reports this feels a little bit sore from the CPR Respiratory: Denies shortness of breath. Gastrointestinal: No abdominal pain.     Musculoskeletal: Negative for back pain. Skin: Negative for rash. Neurological: Negative for headaches or weakness    ____________________________________________   PHYSICAL EXAM:  VITAL SIGNS: ED Triage Vitals  Enc Vitals Group     BP 01/10/20 1447 (!) 154/79     Pulse Rate 01/10/20 1447 99     Resp 01/10/20 1447 19     Temp 01/10/20 1447 98.2 F (36.8 C)     Temp Source 01/10/20 1447 Oral     SpO2 01/10/20 1447 96 %     Weight 01/10/20 1445 180 lb (81.6 kg)     Height 01/10/20 1445 '5\' 8"'  (1.727 m)     Head Circumference --      Peak Flow --      Pain Score 01/10/20 1444 7     Pain Loc --      Pain Edu? --      Excl. in [place]? --     Constitutional: Alert and oriented. Well appearing and in no acute distress.  Ambulatory in the room without distress. Eyes: Conjunctivae are normal. Head: Atraumatic. Nose: No congestion/rhinnorhea. Mouth/Throat: Mucous membranes are moist. Neck: No stridor.  Cardiovascular: Normal rate, regular rhythm. Grossly normal heart sounds.  Good peripheral circulation. Respiratory: Normal respiratory effort.  No retractions. Lungs CTAB. Gastrointestinal: Soft and nontender. No distention. Musculoskeletal: No lower extremity tenderness nor edema. Neurologic:  Normal speech and language. No gross focal neurologic deficits are appreciated.  Skin:  Skin is warm, dry and intact. No rash noted.  Small bruise over his left mid humerus, reports is old.  Multiple tattoos. Psychiatric: Mood and affect are normal. Speech and behavior are  normal.  He has clear conversation, clear thought.  No slurring of speech.  Normal gait.  ____________________________________________   LABS (all labs ordered are listed, but only abnormal results are displayed)  Labs Reviewed - No data to display ____________________________________________  EKG  ED ECG REPORT I, Delman Kitten, the attending physician, personally viewed and interpreted this ECG.  Date: 01/10/2020 EKG Time: 1455 Rate: 105 Rhythm: Sinus tachycardia QRS Axis: normal Intervals: normal ST/T Wave abnormalities: normal Narrative Interpretation: no evidence of acute ischemia  ____________________________________________  RADIOLOGY   ____________________________________________   PROCEDURES  Procedure(s) performed: None  Procedures  Critical Care performed: No  ____________________________________________   INITIAL IMPRESSION / ASSESSMENT AND PLAN / ED COURSE  Pertinent labs & imaging results  that were available during my care of the patient were reviewed by me and considered in my medical decision making (see chart for details).   Patient refused any laboratory testing or IV starts.  He certainly has a concerning history of overdose reporting multiple overdoses, however he denies desire to hurt himself or anyone else.  Appears he is likely using this recreationally.  He is willing to receive resources and have consulted with her TTS team to discuss his treatment resource options, will also provide him naloxone kit to take home which she is familiar with has had unfortunately have administered on him before  This patient does seem to be at risk of overdose for sure, he is currently well recovered with normal mental status.  He does endorse some desires to quit and he clearly endorses that he knows the risk of death, aspiration, and other potential very negative side effects of his heroin use.   ----------------------------------------- 3:53 PM on  01/10/2020 -----------------------------------------  Patient did not wish to wait any further for observation.  Reports that he needs to leave now.  Discussed with him, reviewed that he is leaving [place] and the risk of potential death and my recommendation for ongoing observation and coordination of care for treatment resources.  However the patient refuses this.  He is however willing to accept discharge instructions and signed [place] understanding the risks of himself and the potential risk of death or severe disability.   Patient is fully awake alert ambulatory without distress.  Pleasant well oriented.   ____________________________________________   FINAL CLINICAL IMPRESSION(S) / ED DIAGNOSES  Final diagnoses:  Accidental overdose of heroin, initial encounter [facility])        Note:  This document was prepared using Dragon voice recognition software and may include unintentional dictation errors       Delman Kitten, MD 01/10/20 1554

## 2020-01-10 NOTE — ED Notes (Signed)
ED Provider at bedside. 

## 2020-01-10 NOTE — ED Notes (Signed)
Pt requesting to leave AMA, states his ride will leave him if he doesn't go now. Pt signed AMA form. Given Narcan nasal spray for home use prior to departure. Pt left ED in NAD, ambulatory with steady gait.

## 2020-01-10 NOTE — ED Triage Notes (Deleted)
Pt presents to ED via [facility] from home. Per EMS pt overdosed on unknown amount of heroin, pt went to needle exchange program and received narcan prior to EMS arrival, EMS reports pt had narcan in vials, unsure of dosage in vials. Per EMS pt has had multiple drug overdoses in the last few weeks.

## 2020-02-01 ENCOUNTER — Other Ambulatory Visit: Payer: Self-pay

## 2020-02-01 ENCOUNTER — Emergency Department
Admission: EM | Admit: 2020-02-01 | Discharge: 2020-02-01 | Disposition: A | Discharge status: Home / Self Care | Payer: Self-pay | Attending: Emergency Medicine | Admitting: Emergency Medicine

## 2020-02-01 DIAGNOSIS — F17210 Nicotine dependence, cigarettes, uncomplicated: Secondary | ICD-10-CM | POA: Insufficient documentation

## 2020-02-01 DIAGNOSIS — T401X1A Poisoning by heroin, accidental (unintentional), initial encounter: Secondary | ICD-10-CM | POA: Insufficient documentation

## 2020-02-01 DIAGNOSIS — F329 Major depressive disorder, single episode, unspecified: Secondary | ICD-10-CM | POA: Insufficient documentation

## 2020-02-01 DIAGNOSIS — Z79899 Other long term (current) drug therapy: Secondary | ICD-10-CM | POA: Insufficient documentation

## 2020-02-01 LAB — COMPREHENSIVE METABOLIC PANEL
ALT: 1634 U/L — ABNORMAL HIGH (ref 0–44)
AST: 1142 U/L — ABNORMAL HIGH (ref 15–41)
Albumin: 4.2 g/dL (ref 3.5–5.0)
Alkaline Phosphatase: 102 U/L (ref 38–126)
Anion gap: 8 (ref 5–15)
BUN: 15 mg/dL (ref 6–20)
CO2: 30 mmol/L (ref 22–32)
Calcium: 8.8 mg/dL — ABNORMAL LOW (ref 8.9–10.3)
Chloride: 100 mmol/L (ref 98–111)
Creatinine, Ser: 0.89 mg/dL (ref 0.61–1.24)
GFR calc Af Amer: >60 mL/min (ref >60)
GFR calc non Af Amer: >60 mL/min (ref >60)
Glucose, Bld: 135 mg/dL — ABNORMAL HIGH (ref 70–99)
Potassium: 4.2 mmol/L (ref 3.5–5.1)
Sodium: 138 mmol/L (ref 135–145)
Total Bilirubin: 0.8 mg/dL (ref 0.3–1.2)
Total Protein: 7.8 g/dL (ref 6.5–8.1)

## 2020-02-01 LAB — URINE DRUG SCREEN, QUALITATIVE (ARMC ONLY)
Amphetamines, Ur Screen: NOT DETECTED
Barbiturates, Ur Screen: NOT DETECTED
Benzodiazepine, Ur Scrn: NOT DETECTED
Cannabinoid 50 Ng, Ur ~~LOC~~: POSITIVE — AB
Cocaine Metabolite,Ur ~~LOC~~: POSITIVE — AB
MDMA (Ecstasy)Ur Screen: NOT DETECTED
Methadone Scn, Ur: NOT DETECTED
Opiate, Ur Screen: POSITIVE — AB
Phencyclidine (PCP) Ur S: NOT DETECTED
Tricyclic, Ur Screen: NOT DETECTED

## 2020-02-01 LAB — CBC
HCT: 44.2 % (ref 39.0–52.0)
Hemoglobin: 15.1 g/dL (ref 13.0–17.0)
MCH: 31 pg (ref 26.0–34.0)
MCHC: 34.2 g/dL (ref 30.0–36.0)
MCV: 90.8 fL (ref 80.0–100.0)
Platelets: 216 10*3/uL (ref 150–400)
RBC: 4.87 MIL/uL (ref 4.22–5.81)
RDW: 13.1 % (ref 11.5–15.5)
WBC: 8.7 10*3/uL (ref 4.0–10.5)
nRBC: 0 % (ref 0.0–0.2)

## 2020-02-01 LAB — SALICYLATE LEVEL: Salicylate Lvl: <7 mg/dL — ABNORMAL LOW (ref 7.0–30.0)

## 2020-02-01 LAB — ACETAMINOPHEN LEVEL: Acetaminophen (Tylenol), Serum: <10 ug/mL — ABNORMAL LOW (ref 10–30)

## 2020-02-01 LAB — ETHANOL: Alcohol, Ethyl (B): <10 mg/dL (ref <10)

## 2020-02-01 MED ORDER — ONDANSETRON 4 MG PO TBDP: 4.0000 mg | ORAL_TABLET | Freq: Once | ORAL | Status: DC

## 2020-02-01 MED ORDER — NALOXONE HCL 4 MG/0.1ML NA LIQD
1.0000 | Freq: Once | NASAL | Status: AC
  Administered 2020-02-01: 1 via NASAL

## 2020-02-01 MED ORDER — ONDANSETRON HCL 4 MG/2ML IJ SOLN
4.0000 mg | Freq: Once | INTRAMUSCULAR | Status: AC
  Administered 2020-02-01: 4 mg via INTRAVENOUS

## 2020-02-01 NOTE — ED Notes (Signed)
Patient provided phone per request

## 2020-02-01 NOTE — ED Notes (Signed)
Pt ready to leave- states that his ride is going to leave. Refused d/c VS. Narcan kit given to patient- d/c instructions reviewed.

## 2020-02-01 NOTE — ED Triage Notes (Signed)
Patient coming [facility] for heroin overdose. Patient given 1 intranasal narcan, and 0.5 IV narcan in route. Patient initially c/o chest pain to EMS post sternal rub, reports has now resolved.

## 2020-02-01 NOTE — ED Notes (Signed)
[employer] officer at bedside 

## 2020-02-01 NOTE — Discharge Instructions (Signed)
You have been seen in the Emergency Department (ED) today for an accidental heroin overdose.  If you continue using heroin and/or other illegal substances, you will eventually end up dead.  You are the only one who can change your lifestyle. You have been provided with some resources that may help, and if you have any outpatient physicians or therapists, they may also help provide additional resources.  You were also provided with a home Narcan kit.  Keep it with you and let your friends and family know you have one. If you overdose again, they can administer the medication in your nose before calling 911, but you still must come to the Emergency Department because the medication will wear off and you could stop breathing again.  Please take any prescriptions that have been provided as indicated on the instructions.  Please return to the ED immediately if you have ANY thoughts of hurting yourself or anyone else, so that we may help you.  Follow up with your doctor and/or therapist as soon as possible regarding today's ED visit.   Please follow up any other recommendations and clinic appointments provided by the psychiatry team that saw you in the Emergency Department.  Please contact [facility] for additional mental health/psychiatric assistance:  [facility] [address] [place],  [address] Phone:  [phone] or [phone]  Open Access:   Walk-in ASSESSMENT hours, M-W-F, 8:00am - 3:00pm Advanced Acess CRISIS:  M-F, 8:00am - 8:00pm Outpatient Services Office Hours:  M-F, 8:00am - 5:00pm  

## 2020-02-01 NOTE — ED Provider Notes (Signed)
[facility] Emergency Department Provider Note  ____________________________________________   First MD Initiated Contact with Patient 02/01/20 0425     (approximate)  I have reviewed the triage vital signs and the nursing notes.   HISTORY  Chief Complaint Drug Overdose    HPI Edwin [name] is a 34 y.o. male with a history of heroin abuse who presents by EMS after an apparent accidental overdose.  Once he was awake and alert and clinically sober, he reports that he used a little bit too much heroin but it was an accident.  He has no intention to harm himself or anyone else.  He denies chest pain shortness of breath.  He has some nausea and he said his body aches all over but he has no other complaints at this time.  He denies fever and chills.         Past Medical History:  Diagnosis Date  . Drug overdose   . DVT (deep venous thrombosis) ([place])   . Hepatitis C     Patient Active Problem List   Diagnosis Date Noted  . Cannabis abuse   . Major depressive disorder, recurrent episode, severe ([place]) 08/11/2018  . Methamphetamine abuse ([place])   . Cannabis use disorder, moderate, dependence ([place])   . Tobacco use disorder 01/28/2016  . Major depressive disorder, recurrent severe without psychotic features ([place])   . Uncomplicated opioid dependence ([place])   . Opiate or related narcotic overdose ([place]) 11/28/2015  . Substance abuse [facility])     Past Surgical History:  Procedure Laterality Date  . DENTAL SURGERY      Prior to Admission medications   Medication Sig Start Date End Date Taking? Authorizing Provider  ARIPiprazole (ABILIFY) 10 MG tablet Take 1 tablet (10 mg total) by mouth daily. 09/01/18   Pucilowska, Herma Ard B, MD  ARIPiprazole ER (ABILIFY MAINTENA) 400 MG SRER injection Inject 2 mLs (400 mg total) into the muscle every 28 (twenty-eight) days. 09/27/18   Pucilowska, Herma Ard B, MD  doxycycline (VIBRAMYCIN) 100 MG capsule Take 1 capsule  (100 mg total) by mouth 2 (two) times daily. 09/03/19   Noemi Chapel, MD  naloxone [facility]) nasal spray 4 mg/0.1 mL Use as directed 08/31/18   Pucilowska, Jolanta B, MD  sulfamethoxazole-trimethoprim (BACTRIM DS) 800-160 MG tablet Take 1 tablet by mouth 2 (two) times daily. 04/22/19   Fisher, Linden Dolin, PA-C  traZODone (DESYREL) 100 MG tablet Take 1 tablet (100 mg total) by mouth at bedtime as needed for sleep. 08/31/18   Pucilowska, Herma Ard B, MD  venlafaxine XR (EFFEXOR-XR) 150 MG 24 hr capsule Take 1 capsule (150 mg total) by mouth daily with breakfast. 09/01/18   Pucilowska, Wardell Honour, MD    Allergies Mucomyst [acetylcysteine] and Vicodin [hydrocodone-acetaminophen]  No family history on file.  Social History Social History   Tobacco Use  . Smoking status: Current Every Day Smoker    Packs/day: 1.00    Years: 20.00    Pack years: 20.00    Types: Cigarettes  . Smokeless tobacco: Never Used  Substance Use Topics  . Alcohol use: Yes    Comment: beer and liquor, binges  . Drug use: Yes    Frequency: 1.0 times per week    Types: Marijuana, IV, Cocaine, Methamphetamines    Comment: heroin    Review of Systems Constitutional: No fever/chills Eyes: No visual changes. ENT: No sore throat. Cardiovascular: Denies chest pain. Respiratory: Denies shortness of breath. Gastrointestinal: Nausea and vomiting. Genitourinary: Negative for  dysuria. Musculoskeletal: Generalized body aches.  Negative for neck pain.  Negative for back pain. Integumentary: Negative for rash. Neurological: Negative for headaches, focal weakness or numbness. Psychiatric:  Heroin abuse, denies suicidal and homicidal ideation.  ____________________________________________   PHYSICAL EXAM:  VITAL SIGNS: ED Triage Vitals  Enc Vitals Group     BP 02/01/20 0418 138/87     Pulse Rate 02/01/20 0418 (!) 103     Resp 02/01/20 0418 18     Temp 02/01/20 0424 98.4 F (36.9 C)     Temp Source 02/01/20 0424 Axillary      SpO2 02/01/20 0418 100 %     Weight 02/01/20 0421 79.4 kg (175 lb)     Height 02/01/20 0421 1.746 m (5' 8.75")     Head Circumference --      Peak Flow --      Pain Score 02/01/20 0419 0     Pain Loc --      Pain Edu? --      Excl. in [place]? --     Constitutional: Alert and oriented.  Disheveled but not in acute distress at this time. Eyes: Conjunctivae are normal.  Head: Atraumatic. Nose: No congestion/rhinnorhea. Mouth/Throat: Patient is wearing a mask. Neck: No stridor.  No meningeal signs.   Cardiovascular: Normal rate, regular rhythm. Good peripheral circulation. Grossly normal heart sounds. Respiratory: Normal respiratory effort.  No retractions. Gastrointestinal: Soft and nontender. No distention.  Musculoskeletal: No lower extremity tenderness nor edema. No gross deformities of extremities. Neurologic:  Normal speech and language. No gross focal neurologic deficits are appreciated.  Skin:  Skin is warm, dry and intact.  Extensive skin lesions on his arms consistent with heroin abuse. Psychiatric: Mood and affect are normal. Speech and behavior are normal.  Denies SI and HI.  ____________________________________________   LABS (all labs ordered are listed, but only abnormal results are displayed)  Labs Reviewed  COMPREHENSIVE METABOLIC PANEL - Abnormal; Notable for the following components:      Result Value   Glucose, Bld 135 (*)    Calcium 8.8 (*)    AST 1,142 (*)    ALT 1,634 (*)    All other components within normal limits  SALICYLATE LEVEL - Abnormal; Notable for the following components:   Salicylate Lvl <6.3 (*)    All other components within normal limits  ACETAMINOPHEN LEVEL - Abnormal; Notable for the following components:   Acetaminophen (Tylenol), Serum <10 (*)    All other components within normal limits  ETHANOL  CBC  URINE DRUG SCREEN, QUALITATIVE ([facility] ONLY)   ____________________________________________  EKG  ED ECG REPORT I, Hinda Kehr,  the attending physician, personally viewed and interpreted this ECG.  Date: 02/01/2020 EKG Time: 4:19 AM Rate: 96 Rhythm: normal sinus rhythm QRS Axis: normal Intervals: normal ST/T Wave abnormalities: normal Narrative Interpretation: no evidence of acute ischemia  ____________________________________________  RADIOLOGY I, Hinda Kehr, personally viewed and evaluated these images (plain radiographs) as part of my medical decision making, as well as reviewing the written report by the radiologist.  ED MD interpretation: No indication for emergent imaging  Official radiology report(s): No results found.  ____________________________________________   PROCEDURES   Procedure(s) performed (including Critical Care):  Procedures   ____________________________________________   INITIAL IMPRESSION / MDM / [place] / ED COURSE  As part of my medical decision making, I reviewed the following data within the [place] notes reviewed and incorporated, Labs reviewed , EKG interpreted ,  Old chart reviewed, Notes from prior ED visits and [place] Controlled Substance Database   Differential diagnosis includes, but is not limited to, accidental heroin abuse, intentional overdose, other substance abuse.  The patient was monitored in the emergency department for about an hour and a half.  His Narcan should have worn off by now and he is clinically awake and alert, sober, ambulatory without any unsteadiness of gait.  He adamantly denies suicidal ideation.  He says he has no intention to harm himself and I believe him.  He does not want help and just wants to go.  I provided him with a home use Narcan kit and outpatient resources for drug assistance.  He does not meet criteria for involuntary commitment or inpatient treatment and he has the capacity to make his own decisions.  I gave my usual and customary return precautions.        ____________________________________________  FINAL CLINICAL IMPRESSION(S) / ED DIAGNOSES  Final diagnoses:  Accidental overdose of heroin, initial encounter ([place])     MEDICATIONS GIVEN DURING THIS VISIT:  Medications  ondansetron (ZOFRAN-ODT) disintegrating tablet 4 mg (has no administration in time range)  ondansetron (ZOFRAN) injection 4 mg (4 mg Intravenous Given 02/01/20 0520)  naloxone (NARCAN) nasal spray 4 mg/0.1 mL (1 spray Nasal Provided for home use 02/01/20 0536)     ED Discharge Orders    None      *Please note:  Maxey Ransom was evaluated in Emergency Department on 02/01/2020 for the symptoms described in the history of present illness. He was evaluated in the context of the global COVID-19 pandemic, which necessitated consideration that the patient might be at risk for infection with the SARS-CoV-2 virus that causes COVID-19. Institutional protocols and algorithms that pertain to the evaluation of patients at risk for COVID-19 are in a state of rapid change based on information released by regulatory bodies including the CDC and federal and state organizations. These policies and algorithms were followed during the patient's care in the ED.  Some ED evaluations and interventions may be delayed as a result of limited staffing during the pandemic.*  Note:  This document was prepared using Dragon voice recognition software and may include unintentional dictation errors.   Hinda Kehr, MD 02/01/20 [id number]

## 2020-10-10 ENCOUNTER — Emergency Department: Payer: Self-pay

## 2020-10-10 ENCOUNTER — Emergency Department
Admission: EM | Admit: 2020-10-10 | Discharge: 2020-10-11 | Disposition: A | Discharge status: Home / Self Care | Payer: Self-pay | Attending: Emergency Medicine | Admitting: Emergency Medicine

## 2020-10-10 ENCOUNTER — Other Ambulatory Visit: Payer: Self-pay

## 2020-10-10 DIAGNOSIS — Z20822 Contact with and (suspected) exposure to covid-19: Secondary | ICD-10-CM | POA: Insufficient documentation

## 2020-10-10 DIAGNOSIS — F17210 Nicotine dependence, cigarettes, uncomplicated: Secondary | ICD-10-CM | POA: Insufficient documentation

## 2020-10-10 DIAGNOSIS — R45851 Suicidal ideations: Secondary | ICD-10-CM | POA: Insufficient documentation

## 2020-10-10 DIAGNOSIS — F1910 Other psychoactive substance abuse, uncomplicated: Secondary | ICD-10-CM | POA: Insufficient documentation

## 2020-10-10 DIAGNOSIS — R079 Chest pain, unspecified: Secondary | ICD-10-CM | POA: Insufficient documentation

## 2020-10-10 LAB — COMPREHENSIVE METABOLIC PANEL WITH GFR
ALT: 49 U/L — ABNORMAL HIGH (ref 0–44)
AST: 30 U/L (ref 15–41)
Albumin: 4.1 g/dL (ref 3.5–5.0)
Alkaline Phosphatase: 52 U/L (ref 38–126)
Anion gap: 7 (ref 5–15)
BUN: 22 mg/dL — ABNORMAL HIGH (ref 6–20)
CO2: 26 mmol/L (ref 22–32)
Calcium: 8.7 mg/dL — ABNORMAL LOW (ref 8.9–10.3)
Chloride: 107 mmol/L (ref 98–111)
Creatinine, Ser: 0.79 mg/dL (ref 0.61–1.24)
GFR, Estimated: >60 mL/min
Glucose, Bld: 107 mg/dL — ABNORMAL HIGH (ref 70–99)
Potassium: 3.9 mmol/L (ref 3.5–5.1)
Sodium: 140 mmol/L (ref 135–145)
Total Bilirubin: 0.7 mg/dL (ref 0.3–1.2)
Total Protein: 7.6 g/dL (ref 6.5–8.1)

## 2020-10-10 LAB — CBC WITH DIFFERENTIAL/PLATELET
Abs Immature Granulocytes: 0.03 10*3/uL (ref 0.00–0.07)
Basophils Absolute: 0.1 10*3/uL (ref 0.0–0.1)
Basophils Relative: 1 %
Eosinophils Absolute: 0.2 10*3/uL (ref 0.0–0.5)
Eosinophils Relative: 2 %
HCT: 39.4 % (ref 39.0–52.0)
Hemoglobin: 13.9 g/dL (ref 13.0–17.0)
Immature Granulocytes: 0 %
Lymphocytes Relative: 28 %
Lymphs Abs: 2.6 10*3/uL (ref 0.7–4.0)
MCH: 30.8 pg (ref 26.0–34.0)
MCHC: 35.3 g/dL (ref 30.0–36.0)
MCV: 87.4 fL (ref 80.0–100.0)
Monocytes Absolute: 1.1 10*3/uL — ABNORMAL HIGH (ref 0.1–1.0)
Monocytes Relative: 11 %
Neutro Abs: 5.6 10*3/uL (ref 1.7–7.7)
Neutrophils Relative %: 58 %
Platelets: 236 10*3/uL (ref 150–400)
RBC: 4.51 MIL/uL (ref 4.22–5.81)
RDW: 13.2 % (ref 11.5–15.5)
WBC: 9.5 10*3/uL (ref 4.0–10.5)
nRBC: 0 % (ref 0.0–0.2)

## 2020-10-10 LAB — RESP PANEL BY RT-PCR (FLU A&B, COVID) ARPGX2
Influenza A by PCR: NEGATIVE
Influenza B by PCR: NEGATIVE
SARS Coronavirus 2 by RT PCR: NEGATIVE

## 2020-10-10 LAB — TROPONIN I (HIGH SENSITIVITY): Troponin I (High Sensitivity): 3 ng/L

## 2020-10-10 LAB — SALICYLATE LEVEL: Salicylate Lvl: <7 mg/dL — ABNORMAL LOW (ref 7.0–30.0)

## 2020-10-10 LAB — ACETAMINOPHEN LEVEL: Acetaminophen (Tylenol), Serum: <10 ug/mL — ABNORMAL LOW (ref 10–30)

## 2020-10-10 LAB — ETHANOL: Alcohol, Ethyl (B): <10 mg/dL

## 2020-10-10 MED ORDER — LACTATED RINGERS IV BOLUS
1000.0000 mL | Freq: Once | INTRAVENOUS | Status: AC
  Administered 2020-10-10: 1000 mL via INTRAVENOUS

## 2020-10-10 NOTE — ED Provider Notes (Addendum)
[facility] Emergency Department Provider Note  ____________________________________________   Event Date/Time   First MD Initiated Contact with Patient 10/10/20 2153     (approximate)  I have reviewed the triage vital signs and the nursing notes.   HISTORY  Chief Complaint Drug Overdose   HPI Edwin [name] is a 34 y.o. male with past medical history of DVT, polysubstance abuse and hep C who presents via EMS after trip deputies called EMS after patient was found asleep on side of the road.  Patient told EMS and states on arrival that last 1224 hrs. he is taken significant amount of heroin meth and fentanyl.  He also states that over the last 4 to 5 days he has had some substernal chest pain and shortness of breath.  He denies any cough, fevers, chills, nausea, vomiting, diarrhea, dysuria, Donnell pain, back pain, rash or extremity pain.  He denies any recent falls or injuries.  He denies any HI or hallucinations but when asked if he is hurt himself he states "I would be mad if I ended up dead from the drugs I took".  He does endorse depression and states he has not been sleeping well.  Denies taking any other medications at this time.         Past Medical History:  Diagnosis Date   Drug overdose    DVT (deep venous thrombosis) (HCC)    Hepatitis C     Patient Active Problem List   Diagnosis Date Noted   Cannabis abuse    Major depressive disorder, recurrent episode, severe (HCC) 08/11/2018   Methamphetamine abuse (HCC)    Cannabis use disorder, moderate, dependence (HCC)    Tobacco use disorder 01/28/2016   Major depressive disorder, recurrent severe without psychotic features (HCC)    Uncomplicated opioid dependence (HCC)    Opiate or related narcotic overdose (HCC) 11/28/2015   Substance abuse (HCC)     Past Surgical History:  Procedure Laterality Date   DENTAL SURGERY      Prior to Admission medications   Medication  Sig Start Date End Date Taking? Authorizing Provider  ARIPiprazole (ABILIFY) 10 MG tablet Take 1 tablet (10 mg total) by mouth daily. 09/01/18   Pucilowska, Braulio Conte B, MD  ARIPiprazole ER (ABILIFY MAINTENA) 400 MG SRER injection Inject 2 mLs (400 mg total) into the muscle every 28 (twenty-eight) days. 09/27/18   Pucilowska, Braulio Conte B, MD  doxycycline (VIBRAMYCIN) 100 MG capsule Take 1 capsule (100 mg total) by mouth 2 (two) times daily. 09/03/19   Eber Hong, MD  naloxone [facility]) nasal spray 4 mg/0.1 mL Use as directed 08/31/18   Pucilowska, Jolanta B, MD  sulfamethoxazole-trimethoprim (BACTRIM DS) 800-160 MG tablet Take 1 tablet by mouth 2 (two) times daily. 04/22/19   Fisher, Roselyn Bering, PA-C  traZODone (DESYREL) 100 MG tablet Take 1 tablet (100 mg total) by mouth at bedtime as needed for sleep. 08/31/18   Pucilowska, Braulio Conte B, MD  venlafaxine XR (EFFEXOR-XR) 150 MG 24 hr capsule Take 1 capsule (150 mg total) by mouth daily with breakfast. 09/01/18   Pucilowska, Ellin Goodie, MD    Allergies Mucomyst [acetylcysteine] and Vicodin [hydrocodone-acetaminophen]  No family history on file.  Social History Social History   Tobacco Use   Smoking status: Current Every Day Smoker    Packs/day: 1.00    Years: 20.00    Pack years: 20.00    Types: Cigarettes   Smokeless tobacco: Never Used  Substance Use Topics  Alcohol use: Yes    Comment: beer and liquor, binges   Drug use: Yes    Frequency: 1.0 times per week    Types: Marijuana, IV, Cocaine, Methamphetamines    Comment: heroin    Review of Systems  Review of Systems  Constitutional: Negative for chills and fever.  HENT: Negative for sore throat.   Eyes: Negative for pain.  Respiratory: Positive for shortness of breath. Negative for cough and stridor.   Cardiovascular: Positive for chest pain.  Gastrointestinal: Negative for vomiting.  Genitourinary: Negative for dysuria.  Musculoskeletal: Negative for myalgias.  Skin: Negative for  rash.  Neurological: Negative for seizures, loss of consciousness and headaches.  Psychiatric/Behavioral: Positive for depression, substance abuse and suicidal ideas.  All other systems reviewed and are negative.     ____________________________________________   PHYSICAL EXAM:  VITAL SIGNS: ED Triage Vitals  Enc Vitals Group     BP      Pulse      Resp      Temp      Temp src      SpO2      Weight      Height      Head Circumference      Peak Flow      Pain Score      Pain Loc      Pain Edu?      Excl. in GC?    Vitals:   10/10/20 2230 10/10/20 2300  BP: (!) 129/98 108/70  Pulse: 71 64  Resp: 18 11  Temp:    SpO2: 98% 96%   Physical Exam Vitals and nursing note reviewed.  Constitutional:      Appearance: He is well-developed and well-nourished.  HENT:     Head: Normocephalic and atraumatic.     Right Ear: External ear normal.     Left Ear: External ear normal.     Nose: Nose normal.  Eyes:     Conjunctiva/sclera: Conjunctivae normal.  Cardiovascular:     Rate and Rhythm: Normal rate and regular rhythm.     Heart sounds: No murmur heard.   Pulmonary:     Effort: Pulmonary effort is normal. No respiratory distress.     Breath sounds: Normal breath sounds.  Abdominal:     Palpations: Abdomen is soft.     Tenderness: There is no abdominal tenderness.  Musculoskeletal:        General: No edema.     Cervical back: Neck supple.  Skin:    General: Skin is warm and dry.  Neurological:     Mental Status: He is alert.  Psychiatric:        Mood and Affect: Mood is depressed.        Thought Content: Thought content includes suicidal ideation. Thought content does not include homicidal ideation.      ____________________________________________   LABS (all labs ordered are listed, but only abnormal results are displayed)  Labs Reviewed  COMPREHENSIVE METABOLIC PANEL - Abnormal; Notable for the following components:      Result Value   Glucose, Bld  107 (*)    BUN 22 (*)    Calcium 8.7 (*)    ALT 49 (*)    All other components within normal limits  CBC WITH DIFFERENTIAL/PLATELET - Abnormal; Notable for the following components:   Monocytes Absolute 1.1 (*)    All other components within normal limits  ACETAMINOPHEN LEVEL - Abnormal; Notable for the following components:   Acetaminophen (  Tylenol), Serum <10 (*)    All other components within normal limits  SALICYLATE LEVEL - Abnormal; Notable for the following components:   Salicylate Lvl <7.0 (*)    All other components within normal limits  RESP PANEL BY RT-PCR (FLU A&B, COVID) ARPGX2  ETHANOL  URINE DRUG SCREEN, QUALITATIVE ([facility] ONLY)  TROPONIN I (HIGH SENSITIVITY)  TROPONIN I (HIGH SENSITIVITY)   ____________________________________________  EKG  Sinus rhythm with a ventricular of 69, normal axis, unremarkable intervals, no evidence of acute ischemia or significant underlying arrhythmia. ____________________________________________  RADIOLOGY  ED MD interpretation: No focal consolidation, large effusion, pneumothorax, overt edema, or other acute intrathoracic process.   Official radiology report(s): DG Chest 1 View  Result Date: 10/10/2020 CLINICAL DATA:  Shortness of breath.  Overdose. EXAM: CHEST  1 VIEW COMPARISON:  08/01/2018 FINDINGS: Heart size is borderline enlarged. There is no pneumothorax. No large pleural effusion. No focal infiltrate. No acute osseous abnormality. IMPRESSION: Borderline cardiomegaly. No acute cardiopulmonary process. Electronically Signed   By: Katherine Mantle M.D.   On: 10/10/2020 22:15    ____________________________________________   PROCEDURES  Procedure(s) performed (including Critical Care):  .1-3 Lead EKG Interpretation Performed by: Gilles Chiquito, MD Authorized by: Gilles Chiquito, MD     Interpretation: normal     ECG rate assessment: normal     Rhythm: sinus rhythm     Ectopy: none     Conduction: normal        ____________________________________________   INITIAL IMPRESSION / ASSESSMENT AND PLAN / ED COURSE      Patient presents with [place] to history exam via EMS with concern for polysubstance overdose given he was found asleep on the side of the road as well as making suicidal statements.  In addition on arrival he does complain of 5 days of shortness of breath and chest pain.  He is afebrile hemodynamically stable on arrival.  He is somewhat sleepy but has a nonfocal neuro exam and is not particularly hypoxic.  Do not believe he requires Narcan at this time.  Regarding patient's chest pain unclear etiology at this time.  Differential includes but is not limited to ACS, PE, pneumonia, pneumothorax, endocarditis, myocarditis, pericarditis, anemia, metabolic derangements, and chest wall pain.  Chest x-ray shows no evidence of pneumonia, pneumothorax or other acute thoracic process.  No fever or cardiac murmur, elevated white blood cell count, or other findings to suggest acute endocarditis at this time.  Troponin is not elevated and I have low suspicion for ACS or myocarditis at this time.  Symptoms described are not clearly consistent with pericarditis and there is no significant anemia or metabolic derangements on CBC or CMP.  CMP otherwise shows no significant ocular or metabolic derangements.  Covid is negative.  CBC shows no evidence of anemia or other significant derangements.  Serum acetaminophen, salicylates and ethanol undetectable.  Given stable vital signs with duration of symptoms greater than 3 hours and nonelevated troponin with otherwise reassuring exam work-up I believe patient is medically cleared at this time with regard to his chest pain shortness of breath.  However he will require period of observation to metabolize likely heroin causing his sleepiness..  TTS and psychiatry service consulted.  Given given suicidal statements IVC paperwork was filled out.  The patient has been placed  in psychiatric observation due to the need to provide a safe environment for the patient while obtaining psychiatric consultation and evaluation, as well as ongoing medical and medication management to treat the  patient's condition.  The patient has been placed under full IVC at this time.   ____________________________________________   FINAL CLINICAL IMPRESSION(S) / ED DIAGNOSES  Final diagnoses:  Chest pain, unspecified type  Suicidal thoughts  Polysubstance abuse (HCC)    Medications  lactated ringers bolus 1,000 mL (1,000 mLs Intravenous New Bag/Given 10/10/20 2202)     ED Discharge Orders    None       Note:  This document was prepared using Dragon voice recognition software and may include unintentional dictation errors.   Gilles ChiquitoSmith, Lynnmarie Lovett P, MD 10/10/20 2321    Gilles ChiquitoSmith, Sandia Pfund P, MD 10/10/20 [phone]

## 2020-10-10 NOTE — ED Triage Notes (Signed)
Pt arrives [facility] from home w cc of drug overdose. [place] highway patrol [place] in car, per EMS they will be coming for blood test. Patient reports injecting 1g cocaine, 1g combined fentanyl and heroine. Hx drug use, SI. Patient currently denies SI/HI/H/D. Patient states he hasn't slept in several days due to stress w significant other.  20G L hand

## 2020-10-11 DIAGNOSIS — R079 Chest pain, unspecified: Secondary | ICD-10-CM | POA: Insufficient documentation

## 2020-10-11 DIAGNOSIS — R45851 Suicidal ideations: Secondary | ICD-10-CM

## 2020-10-11 DIAGNOSIS — F1910 Other psychoactive substance abuse, uncomplicated: Secondary | ICD-10-CM

## 2020-10-11 NOTE — ED Provider Notes (Signed)
Emergency department handoff note  Care of this patient was signed out to me at the end of the previous provider shift.  All pertinent patient formation was conveyed and all questions were answered.  Patient was pending metabolization of his opiate overdose prior to discharge.  Patient was seen by on-call psychiatric staff who did not feel that patient requires continued IVC and is stable for outpatient work-up.  Please see psychiatry note for full details.  The patient has been reexamined and is ready to be discharged.  All diagnostic results have been reviewed and discussed with the patient/family.  Care plan has been outlined and the patient/family understands all current diagnoses, results, and treatment plans.  There are no new complaints, changes, or physical findings at this time.  All questions have been addressed and answered.  All medications, if any, that were given while in the emergency department or any that are being prescribed have been reviewed with the patient/family.  All side effects and adverse reactions have been explained.  Patient was instructed to, and agrees to follow-up with their primary care physician as well as return to the emergency department if any new or worsening symptoms develop.   Merwyn Katos, MD 10/11/20 [phone]
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
## 2020-11-17 ENCOUNTER — Other Ambulatory Visit: Payer: Self-pay

## 2020-11-17 ENCOUNTER — Emergency Department: Payer: Self-pay

## 2020-11-17 DIAGNOSIS — M25511 Pain in right shoulder: Secondary | ICD-10-CM | POA: Insufficient documentation

## 2020-11-17 DIAGNOSIS — J329 Chronic sinusitis, unspecified: Secondary | ICD-10-CM | POA: Insufficient documentation

## 2020-11-17 DIAGNOSIS — F17210 Nicotine dependence, cigarettes, uncomplicated: Secondary | ICD-10-CM | POA: Insufficient documentation

## 2020-11-17 DIAGNOSIS — Y9355 Activity, bike riding: Secondary | ICD-10-CM | POA: Insufficient documentation

## 2020-11-17 NOTE — ED Provider Notes (Signed)
[facility] Emergency Department Provider Note    ____________________________________________   I have reviewed the triage vital signs and the nursing notes.   HISTORY  Chief Complaint Fall   History limited by: Not Limited   HPI Edwin [name] is a 35 y.o. male who presents to the emergency department today with complaint of right shoulder pain. Patient states that he fell onto the shoulder. He says he has had some tingling going down his arm. The patient also has complaints of a sinus infection. He says that it has been going on for roughly 1 week. Has been having yellow phlegm and congestion. This has caused some dizziness.    Records reviewed. Per medical record review patient has a history of depression.  Past Medical History:  Diagnosis Date  . Drug overdose   . DVT (deep venous thrombosis) ([facility])   . Hepatitis C     Patient Active Problem List   Diagnosis Date Noted  . Suicidal thoughts   . Chest pain   . Cannabis abuse   . Major depressive disorder, recurrent episode, severe ([facility]) 08/11/2018  . Methamphetamine abuse ([facility])   . Cannabis use disorder, moderate, dependence ([facility])   . Tobacco use disorder 01/28/2016  . 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 Uncomplicated opioid dependence ([facility])   . Opiate or related narcotic overdose ([facility]) 11/28/2015  . Polysubstance abuse [facility])     Past Surgical History:  Procedure Laterality Date  . DENTAL SURGERY      Prior to Admission medications   Medication Sig Start Date End Date Taking? Authorizing Provider  ARIPiprazole (ABILIFY) 10 MG tablet Take 1 tablet (10 mg total) by mouth daily. 09/01/18   Pucilowska, Braulio Conte B, MD  ARIPiprazole ER (ABILIFY MAINTENA) 400 MG SRER injection Inject 2 mLs (400 mg total) into the muscle every 28 (twenty-eight) days. 09/27/18   Pucilowska, Braulio Conte B, MD  doxycycline (VIBRAMYCIN) 100 MG capsule Take 1 capsule  (100 mg total) by mouth 2 (two) times daily. 09/03/19   Eber Hong, MD  naloxone [facility]) nasal spray 4 mg/0.1 mL Use as directed 08/31/18   Pucilowska, Jolanta B, MD  sulfamethoxazole-trimethoprim (BACTRIM DS) 800-160 MG tablet Take 1 tablet by mouth 2 (two) times daily. 04/22/19   Fisher, Roselyn Bering, PA-C  traZODone (DESYREL) 100 MG tablet Take 1 tablet (100 mg total) by mouth at bedtime as needed for sleep. 08/31/18   Pucilowska, Braulio Conte B, MD  venlafaxine XR (EFFEXOR-XR) 150 MG 24 hr capsule Take 1 capsule (150 mg total) by mouth daily with breakfast. 09/01/18   Pucilowska, Ellin Goodie, MD    Allergies Mucomyst [acetylcysteine] and Vicodin [hydrocodone-acetaminophen]  No family history on file.  Social History Social History   Tobacco Use  . Smoking status: Current Every Day Smoker    Packs/day: 1.00    Years: 20.00    Pack years: 20.00    Types: Cigarettes  . Smokeless tobacco: Never Used  Substance Use Topics  . Alcohol use: Yes    Comment: beer and liquor, binges  . Drug use: Yes    Frequency: 1.0 times per week    Types: Marijuana, IV, Cocaine, Methamphetamines    Comment: heroin    Review of Systems Constitutional: No fever/chills Eyes: No visual changes. ENT: Positive for congestion. Cardiovascular: Denies chest pain. Respiratory: Denies shortness of breath. Gastrointestinal: No abdominal pain.  No nausea, no vomiting.  No diarrhea.   Genitourinary: Negative for dysuria. Musculoskeletal: Positive  for right shoulder pain.  Skin: Negative for rash. Neurological: Negative for headaches, focal weakness or numbness.  ____________________________________________   PHYSICAL EXAM:  VITAL SIGNS: ED Triage Vitals  Enc Vitals Group     BP 11/17/20 2032 114/76     Pulse Rate 11/17/20 2032 (!) 112     Resp 11/17/20 2032 20     Temp 11/17/20 2032 98.2 F (36.8 C)     Temp Source 11/17/20 2032 Oral     SpO2 11/17/20 2032 96 %     Weight 11/17/20 2033 190 lb (86.2 kg)      Height 11/17/20 2033 5\' 8"  (1.727 m)     Head Circumference --      Peak Flow --      Pain Score 11/17/20 2033 8    Constitutional: Alert and oriented.  Eyes: Conjunctivae are normal.  ENT      Head: Normocephalic and atraumatic.      Nose: No congestion/rhinnorhea.      Mouth/Throat: Mucous membranes are moist.      Neck: No stridor. Hematological/Lymphatic/Immunilogical: No cervical lymphadenopathy. Cardiovascular: Normal rate, regular rhythm.  No murmurs, rubs, or gallops.  Respiratory: Normal respiratory effort without tachypnea nor retractions. Breath sounds are clear and equal bilaterally. No wheezes/rales/rhonchi. Gastrointestinal: Soft and non tender. No rebound. No guarding.  Genitourinary: Deferred Musculoskeletal: No deformity to right shoulder. Radial and ulnar pulse 2+ distally. NV intact.  Neurologic:  Normal speech and language. No gross focal neurologic deficits are appreciated.  Skin:  Skin is warm, dry and intact. No rash noted. Psychiatric: Mood and affect are normal. Speech and behavior are normal. Patient exhibits appropriate insight and judgment.  ____________________________________________    LABS (pertinent positives/negatives)  None  ____________________________________________   EKG  None  ____________________________________________    RADIOLOGY  Right shoulder/clavicle No acute abnormality  ____________________________________________   PROCEDURES  Procedures  ____________________________________________   INITIAL IMPRESSION / ASSESSMENT AND PLAN / ED COURSE  Pertinent labs & imaging results that were available during my care of the patient were reviewed by me and considered in my medical decision making (see chart for details).   Patient presented to the emergency department tonight with primary complaint of right shoulder pain. X-rays without acute fracture or dislocation. NV intact distally. Discussed possible soft tissue  injury and will give patient orthopedic follow up information. Additionally complaining of sinusitis. Will start on antibiotics.   ____________________________________________   FINAL CLINICAL IMPRESSION(S) / ED DIAGNOSES  Final diagnoses:  Fall, initial encounter  Sinusitis, unspecified chronicity, unspecified location     Note: This dictation was prepared with Dragon dictation. Any transcriptional errors that result from this process are unintentional     11-26-1971, MD 11/18/20 0009
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
## 2020-11-17 NOTE — ED Provider Notes (Incomplete)
[facility] Emergency Department Provider Note  {** REMINDER - THIS NOTE IS NOT A FINAL MEDICAL RECORD UNTIL IT IS SIGNED.  UNTIL THEN, THE CONTENT BELOW MAY REFLECT INFORMATION FROM A DOCUMENTATION TEMPLATE, NOT THE ACTUAL PATIENT VISIT. **}  ____________________________________________   I have reviewed the triage vital signs and the nursing notes.   HISTORY  Chief Complaint Fall   History limited by: Not Limited   HPI Edwin [name] is a 35 y.o. male who presents to the emergency department today with complaint of right shoulder pain. Patient states that he fell onto the shoulder. He says he has had some tingling going down his arm. The patient    LOCATION: *** DURATION: *** TIMING: *** SEVERITY: *** QUALITY: *** CONTEXT: *** MODIFYING FACTORS: *** ASSOCIATED SYMPTOMS: ***  Records reviewed. Per medical record review patient has a history of ***  Past Medical History:  Diagnosis Date  . Drug overdose   . DVT (deep venous thrombosis) (HCC)   . Hepatitis C     Patient Active Problem List   Diagnosis Date Noted  . Suicidal thoughts   . Chest pain   . Cannabis abuse   . Major depressive disorder, recurrent episode, severe (HCC) 08/11/2018  . Methamphetamine abuse (HCC)   . Cannabis use disorder, moderate, dependence (HCC)   . Tobacco use disorder 01/28/2016  . 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 Uncomplicated opioid dependence (HCC)   . Opiate or related narcotic overdose (HCC) 11/28/2015  . Polysubstance abuse [facility])     Past Surgical History:  Procedure Laterality Date  . DENTAL SURGERY      Prior to Admission medications   Medication Sig Start Date End Date Taking? Authorizing Provider  ARIPiprazole (ABILIFY) 10 MG tablet Take 1 tablet (10 mg total) by mouth daily. 09/01/18   Pucilowska, Braulio Conte B, MD  ARIPiprazole ER (ABILIFY MAINTENA) 400 MG SRER injection Inject 2 mLs (400 mg  total) into the muscle every 28 (twenty-eight) days. 09/27/18   Pucilowska, Braulio Conte B, MD  doxycycline (VIBRAMYCIN) 100 MG capsule Take 1 capsule (100 mg total) by mouth 2 (two) times daily. 09/03/19   Eber Hong, MD  naloxone [facility]) nasal spray 4 mg/0.1 mL Use as directed 08/31/18   Pucilowska, Jolanta B, MD  sulfamethoxazole-trimethoprim (BACTRIM DS) 800-160 MG tablet Take 1 tablet by mouth 2 (two) times daily. 04/22/19   Fisher, Roselyn Bering, PA-C  traZODone (DESYREL) 100 MG tablet Take 1 tablet (100 mg total) by mouth at bedtime as needed for sleep. 08/31/18   Pucilowska, Braulio Conte B, MD  venlafaxine XR (EFFEXOR-XR) 150 MG 24 hr capsule Take 1 capsule (150 mg total) by mouth daily with breakfast. 09/01/18   Pucilowska, Ellin Goodie, MD    Allergies Mucomyst [acetylcysteine] and Vicodin [hydrocodone-acetaminophen]  No family history on file.  Social History Social History   Tobacco Use  . Smoking status: Current Every Day Smoker    Packs/day: 1.00    Years: 20.00    Pack years: 20.00    Types: Cigarettes  . Smokeless tobacco: Never Used  Substance Use Topics  . Alcohol use: Yes    Comment: beer and liquor, binges  . Drug use: Yes    Frequency: 1.0 times per week    Types: Marijuana, IV, Cocaine, Methamphetamines    Comment: heroin    Review of Systems Constitutional: No fever/chills Eyes: No visual changes. ENT: No sore throat. Cardiovascular: Denies chest pain. Respiratory: Denies shortness of  breath. Gastrointestinal: No abdominal pain.  No nausea, no vomiting.  No diarrhea.   Genitourinary: Negative for dysuria. Musculoskeletal: Negative for back pain. Skin: Negative for rash. Neurological: Negative for headaches, focal weakness or numbness.  ____________________________________________   PHYSICAL EXAM:  VITAL SIGNS: ED Triage Vitals  Enc Vitals Group     BP 11/17/20 2032 114/76     Pulse Rate 11/17/20 2032 (!) 112     Resp 11/17/20 2032 20     Temp 11/17/20 2032  98.2 F (36.8 C)     Temp Source 11/17/20 2032 Oral     SpO2 11/17/20 2032 96 %     Weight 11/17/20 2033 190 lb (86.2 kg)     Height 11/17/20 2033 5\' 8"  (1.727 m)     Head Circumference --      Peak Flow --      Pain Score 11/17/20 2033 8     Pain Loc --      Pain Edu? --      Excl. in GC? --      Constitutional: Alert and oriented.  Eyes: Conjunctivae are normal.  ENT      Head: Normocephalic and atraumatic.      Nose: No congestion/rhinnorhea.      Mouth/Throat: Mucous membranes are moist.      Neck: No stridor. Hematological/Lymphatic/Immunilogical: No cervical lymphadenopathy. Cardiovascular: Normal rate, regular rhythm.  No murmurs, rubs, or gallops. *** Respiratory: Normal respiratory effort without tachypnea nor retractions. Breath sounds are clear and equal bilaterally. No wheezes/rales/rhonchi. Gastrointestinal: Soft and non tender. No rebound. No guarding.  Genitourinary: Deferred Musculoskeletal: Normal range of motion in all extremities. No lower extremity edema. Neurologic:  Normal speech and language. No gross focal neurologic deficits are appreciated.  Skin:  Skin is warm, dry and intact. No rash noted. Psychiatric: Mood and affect are normal. Speech and behavior are normal. Patient exhibits appropriate insight and judgment.  ____________________________________________    LABS (pertinent positives/negatives)  {Name/None:19197::"None"}  ____________________________________________   EKG  {Name/None:19197::"None"}  ____________________________________________    RADIOLOGY  Right shoulder/clavicle No acute abnormality  ____________________________________________   PROCEDURES  Procedures  ____________________________________________   INITIAL IMPRESSION / ASSESSMENT AND PLAN / ED COURSE  Pertinent labs & imaging results that were available during my care of the patient were reviewed by me and considered in my medical decision making (see  chart for details).   ***  Discussed with patient/family results of testing/physical exam, differential plan and return precautions.  ____________________________________________   FINAL CLINICAL IMPRESSION(S) / ED DIAGNOSES  Final diagnoses:  None     Note: This dictation was prepared with Dragon dictation. Any transcriptional errors that result from this process are unintentional

## 2020-11-18 ENCOUNTER — Emergency Department
Admission: EM | Admit: 2020-11-18 | Discharge: 2020-11-18 | Disposition: A | Discharge status: Home / Self Care | Payer: Self-pay | Attending: Emergency Medicine | Admitting: Emergency Medicine

## 2020-11-18 DIAGNOSIS — W19XXXA Unspecified fall, initial encounter: Secondary | ICD-10-CM

## 2020-11-18 DIAGNOSIS — J329 Chronic sinusitis, unspecified: Secondary | ICD-10-CM

## 2020-11-18 MED ORDER — AMOXICILLIN-POT CLAVULANATE 875-125 MG PO TABS: 1.0000 | ORAL_TABLET | Freq: Two times a day (BID) | ORAL | 0 refills | Status: AC

## 2020-11-18 NOTE — Discharge Instructions (Addendum)
Please seek medical attention for any high fevers, chest pain, shortness of breath, change in behavior, persistent vomiting, bloody stool or any other new or concerning symptoms.  

## 2020-12-15 ENCOUNTER — Emergency Department: Admission: EM | Admit: 2020-12-15 | Discharge: 2020-12-15 | Discharge status: Against Medical Advice | Payer: Self-pay

## 2021-03-18 ENCOUNTER — Encounter: Payer: Self-pay | Admitting: Emergency Medicine

## 2021-03-18 ENCOUNTER — Other Ambulatory Visit: Payer: Self-pay

## 2021-03-18 ENCOUNTER — Emergency Department
Admission: EM | Admit: 2021-03-18 | Discharge: 2021-03-18 | Disposition: A | Discharge status: Home / Self Care | Payer: Self-pay | Attending: Emergency Medicine | Admitting: Emergency Medicine

## 2021-03-18 DIAGNOSIS — F1110 Opioid abuse, uncomplicated: Secondary | ICD-10-CM | POA: Insufficient documentation

## 2021-03-18 DIAGNOSIS — F17210 Nicotine dependence, cigarettes, uncomplicated: Secondary | ICD-10-CM | POA: Insufficient documentation

## 2021-03-18 DIAGNOSIS — F1910 Other psychoactive substance abuse, uncomplicated: Secondary | ICD-10-CM

## 2021-03-18 DIAGNOSIS — F1510 Other stimulant abuse, uncomplicated: Secondary | ICD-10-CM | POA: Insufficient documentation

## 2021-03-18 NOTE — BH Assessment (Signed)
Referral information for Detox and SA Treatment, faxed to;   . [facility] ([phone]), No beds  . [facility] [phone]) No beds  . [facility] [phone] or [phone])  . [facility] ([place]-[phone]), no beds  ER MD Dr. Derrill Kay updated and patient will be discharged.  Writer provided patient a copy of the listed of facilities and advised him to call everyday, in the morning to check on bed availability. Patient continue to denies SI/H and AV/H.   ___________________________ [facility] [address] [place] [phone]  [facility] [address] [place], [place], [place] [phone]  [facility] [address]  [place], [place] [address] [phone]   [facility] [address] [place],  [place], [place] [address] [phone]

## 2021-03-18 NOTE — ED Triage Notes (Signed)
Arrives stating he wants detox for drugs.  States uses heroin and cocaine.  Tried to get into [facility], but they do not have any beds, so patient came here.  Denies SI/ HI.  States just wants detox.  AAOx3. Skin warm and dry. NAD.  Calm and cooperative

## 2021-03-18 NOTE — ED Provider Notes (Signed)
[facility] Emergency Department Provider Note   ____________________________________________    I have reviewed the triage vital signs and the nursing notes.   HISTORY  Chief Complaint Requesting detox    HPI Edwin [name] is a 35 y.o. male who reports polysubstance abuse including methamphetamines, opioids including fentanyl and cannabis who presents requesting assistance with detox.  Patient denies SI or HI, no physical complaints at this time.  Is apparently already reached out to various detox centers but did not have availability.  Past Medical History:  Diagnosis Date  . Drug overdose   . DVT (deep venous thrombosis) (HCC)   . Hepatitis C     Patient Active Problem List   Diagnosis Date Noted  . Suicidal thoughts   . Chest pain   . Cannabis abuse   . Major depressive disorder, recurrent episode, severe (HCC) 08/11/2018  . Methamphetamine abuse (HCC)   . Cannabis use disorder, moderate, dependence (HCC)   . Tobacco use disorder 01/28/2016  . 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 Uncomplicated opioid dependence (HCC)   . Opiate or related narcotic overdose (HCC) 11/28/2015  . Polysubstance abuse [facility])     Past Surgical History:  Procedure Laterality Date  . DENTAL SURGERY      Prior to Admission medications   Medication Sig Start Date End Date Taking? Authorizing Provider  ARIPiprazole (ABILIFY) 10 MG tablet Take 1 tablet (10 mg total) by mouth daily. 09/01/18   Pucilowska, Braulio Conte B, MD  ARIPiprazole ER (ABILIFY MAINTENA) 400 MG SRER injection Inject 2 mLs (400 mg total) into the muscle every 28 (twenty-eight) days. 09/27/18   Pucilowska, Braulio Conte B, MD  doxycycline (VIBRAMYCIN) 100 MG capsule Take 1 capsule (100 mg total) by mouth 2 (two) times daily. 09/03/19   Eber Hong, MD  naloxone [facility]) nasal spray 4 mg/0.1 mL Use as directed 08/31/18   Pucilowska, Jolanta B, MD   sulfamethoxazole-trimethoprim (BACTRIM DS) 800-160 MG tablet Take 1 tablet by mouth 2 (two) times daily. 04/22/19   Fisher, Roselyn Bering, PA-C  traZODone (DESYREL) 100 MG tablet Take 1 tablet (100 mg total) by mouth at bedtime as needed for sleep. 08/31/18   Pucilowska, Braulio Conte B, MD  venlafaxine XR (EFFEXOR-XR) 150 MG 24 hr capsule Take 1 capsule (150 mg total) by mouth daily with breakfast. 09/01/18   Pucilowska, Ellin Goodie, MD     Allergies Mucomyst [acetylcysteine] and Vicodin [hydrocodone-acetaminophen]  No family history on file.  Social History Social History   Tobacco Use  . Smoking status: Current Every Day Smoker    Packs/day: 1.00    Years: 20.00    Pack years: 20.00    Types: Cigarettes  . Smokeless tobacco: Never Used  Substance Use Topics  . Alcohol use: Yes    Comment: beer and liquor, binges  . Drug use: Yes    Frequency: 1.0 times per week    Types: Marijuana, IV, Cocaine, Methamphetamines    Comment: heroin    Review of Systems  Constitutional: No fever/chills  ENT: No sore throat.   Gastrointestinal: No abdominal pain.  No nausea, no vomiting.   Genitourinary: Negative for dysuria. Musculoskeletal: Negative for back pain. Skin: Negative for rash. Neurological: Negative for headaches     ____________________________________________   PHYSICAL EXAM:  VITAL SIGNS: ED Triage Vitals  Enc Vitals Group     BP 03/18/21 1100 131/90     Pulse Rate 03/18/21 1100 72  Resp 03/18/21 1100 16     Temp 03/18/21 1100 98.1 F (36.7 C)     Temp src --      SpO2 03/18/21 1100 96 %     Weight 03/18/21 1016 86 kg (189 lb 9.5 oz)     Height 03/18/21 1016 1.727 m (5\' 8" )     Head Circumference --      Peak Flow --      Pain Score 03/18/21 1016 0     Pain Loc --      Pain Edu? --      Excl. in GC? --      Constitutional: Alert and oriented. No acute distress. Pleasant and interactive Eyes: Conjunctivae are normal.  Head: Atraumatic. Nose: No  congestion/rhinnorhea. Mouth/Throat: Mucous membranes are moist.   Cardiovascular: Normal rate, regular rhythm.  Respiratory: Normal respiratory effort.  No retractions. Genitourinary: deferred Musculoskeletal: No lower extremity tenderness nor edema.   Neurologic:  Normal speech and language. No gross focal neurologic deficits are appreciated.   Skin:  Skin is warm, dry and intact. No rash noted.   ____________________________________________   LABS (all labs ordered are listed, but only abnormal results are displayed)  Labs Reviewed - No data to display ____________________________________________  EKG   ____________________________________________  RADIOLOGY   ____________________________________________   PROCEDURES  Procedure(s) performed: No  Procedures   Critical Care performed: No ____________________________________________   INITIAL IMPRESSION / ASSESSMENT AND PLAN / ED COURSE  Pertinent labs & imaging results that were available during my care of the patient were reviewed by me and considered in my medical decision making (see chart for details).  TTS consulted to assist with detox, [facility] may be available, patient is medically cleared for detox   ____________________________________________   FINAL CLINICAL IMPRESSION(S) / ED DIAGNOSES  Final diagnoses:  Substance abuse (HCC)      NEW MEDICATIONS STARTED DURING THIS VISIT:  New Prescriptions   No medications on file     Note:  This document was prepared using Dragon voice recognition software and may include unintentional dictation errors.   03/20/21, MD 03/18/21 [phone]

## 2021-03-18 NOTE — BH Assessment (Signed)
Comprehensive Clinical Assessment (CCA) Screening, Triage and Referral Note  03/18/2021 Edwin [name] [id number]  Edwin [name] is a 35 year old male who presents to the ER seeking assistance with substance abuse detox. He reports of having a daily use of heroin and some use of cocaine and fentanyl. He states he has used daily for approximately 2 years. The longest he's been sober is 75 days and that's when he was incarcerated in 2011. In the past his symptoms of withdrawal have been, cold chills, unable to sleep and restlessness. Patient denies history of seizures, blackouts and no history of violence or aggression. He has no current involvement with the legal system. He denies SI/HI and AV/H.  Chief Complaint: No chief complaint on file.  Visit Diagnosis: Substance Abuse Disorder  Patient Reported Information How did you hear about [place]? Self   Referral name: Self   Referral phone number: No data recorded Whom do you see for routine medical problems? No data recorded  Practice/Facility Name: No data recorded  Practice/Facility Phone Number: No data recorded  Name of Contact: No data recorded  Contact Number: No data recorded  Contact Fax Number: No data recorded  Prescriber Name: No data recorded  Prescriber Address (if known): No data recorded What Is the Reason for Your Visit/Call Today? Seeking substance abuse treatment  How Long Has This Been Causing You Problems? > than 6 months  Have You Recently Been in Any Inpatient Treatment (Hospital/Detox/Crisis Center/28-Day Program)? No   Name/Location of Program/Hospital:No data recorded  How Long Were You There? No data recorded  When Were You Discharged? No data recorded Have You Ever Received Services From [facility] Before? Yes   Who Do You See at [facility]? Medical and mental health treatment  Have You Recently Had Any Thoughts About Hurting Yourself? No   Are You Planning to Commit Suicide/Harm Yourself At  This time?  No  Have you Recently Had Thoughts About Hurting Someone Karolee Ohs? No   Explanation: No data recorded Have You Used Any Alcohol or Drugs in the Past 24 Hours? Yes   How Long Ago Did You Use Drugs or Alcohol?  No data recorded  What Did You Use and How Much? Unable to quantify  What Do You Feel Would Help You the Most Today? Alcohol or Drug Use Treatment  Do You Currently Have a Therapist/Psychiatrist? No   Name of Therapist/Psychiatrist: No data recorded  Have You Been Recently Discharged From Any Office Practice or Programs? No   Explanation of Discharge From Practice/Program:  No data recorded    CCA Screening Triage Referral Assessment Type of Contact: Face-to-Face   Is this Initial or Reassessment? No data recorded  Date Telepsych consult ordered in [facility]:  No data recorded  Time Telepsych consult ordered in [facility]:  No data recorded Patient Reported Information Reviewed? Yes   Patient Left Without Being Seen? No data recorded  Reason for Not Completing Assessment: No data recorded Collateral Involvement: No data recorded Does Patient Have a Court Appointed Legal Guardian? No data recorded  Name and Contact of Legal Guardian:  No data recorded If Minor and Not Living with Parent(s), Who has Custody? No data recorded Is CPS involved or ever been involved? Never  Is APS involved or ever been involved? Never  Patient Determined To Be At Risk for Harm To Self or Others Based on Review of Patient Reported Information or Presenting Complaint? No data recorded  Method: No data recorded  Availability of Means: No  data recorded  Intent: No data recorded  Notification Required: No data recorded  Additional Information for Danger to Others Potential:  No data recorded  Additional Comments for Danger to Others Potential:  No data recorded  Are There Guns or Other Weapons in Your Home?  No data recorded   Types of Guns/Weapons: No data recorded   Are These Weapons Safely  Secured?                              No data recorded   Who Could Verify You Are Able To Have These Secured:    No data recorded Do You Have any Outstanding Charges, Pending Court Dates, Parole/Probation? No data recorded Contacted To Inform of Risk of Harm To Self or Others: No data recorded Location of Assessment: [facility] ED  Does Patient Present under Involuntary Commitment? No   IVC Papers Initial File Date: No data recorded  [place] of Residence: [place]  Patient Currently Receiving the Following Services: Not Receiving Services   Determination of Need: Emergent (2 hours)   Options For Referral: Inpatient Hospitalization; Chemical Dependency Intensive Outpatient Therapy (CDIOP)  Lilyan Gilford MS, LCAS, [facility], [facility] Therapeutic Triage Specialist 03/18/2021 3:08 PM

## 2021-03-18 NOTE — Discharge Instructions (Addendum)
Please seek medical attention for any high fevers, chest pain, shortness of breath, change in behavior, persistent vomiting, bloody stool or any other new or concerning symptoms.  

## 2021-03-18 NOTE — ED Notes (Signed)
D/C and [facility]TS discussed with pt, pt verbalized understanding. NAD noted. Pt ambulatory on D/C with steady gait.

## 2021-11-23 ENCOUNTER — Emergency Department
Admission: EM | Admit: 2021-11-23 | Discharge: 2021-11-23 | Disposition: A | Discharge status: Home / Self Care | Payer: Commercial Managed Care - HMO | Attending: Emergency Medicine | Admitting: Emergency Medicine

## 2021-11-23 ENCOUNTER — Encounter: Payer: Self-pay | Admitting: Emergency Medicine

## 2021-11-23 ENCOUNTER — Other Ambulatory Visit: Payer: Self-pay

## 2021-11-23 DIAGNOSIS — T402X1A Poisoning by other opioids, accidental (unintentional), initial encounter: Secondary | ICD-10-CM | POA: Insufficient documentation

## 2021-11-23 DIAGNOSIS — T40601A Poisoning by unspecified narcotics, accidental (unintentional), initial encounter: Secondary | ICD-10-CM

## 2021-11-23 MED ORDER — NALOXONE HCL 4 MG/0.1ML NA LIQD
1.0000 | Freq: Once | NASAL | Status: AC
  Administered 2021-11-23: 15:00:00 1 via NASAL
  Filled 2021-11-23: qty 4

## 2021-11-23 NOTE — ED Provider Notes (Signed)
[facility] Provider Note    Event Date/Time   First MD Initiated Contact with Patient 11/23/21 1314     (approximate)   History   Drug Overdose   HPI  Edwin [name] is a 36 y.o. male with a past history of polysubstance abuse who comes the ED after apparent opiate overdose.  Patient reports that he was recently released from incarceration, used fentanyl intranasally for the first time in several months, and subsequently became unconscious.  EMS report finding patient, breathing only a few times a minute and unresponsive, giving 0.5 mg of Narcan with return of alertness.  Patient denies any chest pain or shortness of breath or any other acute symptoms, states that he is in his usual state of health.  Not interested in any other current substance abuse referral.     Physical Exam   Triage Vital Signs: ED Triage Vitals  Enc Vitals Group     BP 11/23/21 1320 (!) 144/70     Pulse Rate 11/23/21 1320 99     Resp 11/23/21 1320 18     Temp 11/23/21 1320 98.8 F (37.1 C)     Temp Source 11/23/21 1320 Oral     SpO2 11/23/21 1313 92 %     Weight 11/23/21 1317 206 lb 12.7 oz (93.8 kg)     Height 11/23/21 1317 _0  (1.727 m)     Head Circumference --      Peak Flow --      Pain Score 11/23/21 1318 0     Pain Loc --      Pain Edu? --      Excl. in [place]? --     Most recent vital signs: Vitals:   11/23/21 1313 11/23/21 1320  BP:  (!) 144/70  Pulse:  99  Resp:  18  Temp:  98.8 F (37.1 C)  SpO2: 92% 95%     General: Awake, no distress.  Clear speech CV:  Good peripheral perfusion.  Regular rate and rhythm, normal pulses Resp:  Normal effort.  Unlabored, clear to auscultation bilaterally Abd:  No distention.  Soft nontender Other:  No track marks, no inflammatory skin changes.  No edema.  No wounds.   ED Results / Procedures / Treatments   Labs (all labs ordered are listed, but only abnormal results are displayed) Labs Reviewed - No  data to display   EKG  Interpreted by me Normal sinus rhythm rate of 98, normal axis and intervals.  Normal QRS ST segments and T waves.  No ischemic changes or signs of toxicity.   RADIOLOGY     PROCEDURES:  Critical Care performed: No  .1-3 Lead EKG Interpretation Performed by: Carrie Mew, MD Authorized by: Carrie Mew, MD     Interpretation: normal     ECG rate:  90   ECG rate assessment: normal     Rhythm: sinus rhythm     Ectopy: none     Conduction: normal     MEDICATIONS ORDERED IN ED: Medications  naloxone (NARCAN) nasal spray 4 mg/0.1 mL (has no administration in time range)     IMPRESSION / MDM / ASSESSMENT AND PLAN / ED COURSE  I reviewed the triage vital signs and the nursing notes.                                 The patient is on the cardiac monitor  to evaluate for evidence of arrhythmia and/or significant heart rate changes.  Patient brought to ED by EMS after apparent opioid overdose.  Back to normal mentation and spontaneous breathing on arrival to the ED.  No acute symptoms.  Exam is reassuring.  He was monitored for approximately 2 hours in the ED to ensure no relapsing symptoms after Narcan is worn off.  He remained stable and asymptomatic, tolerating p.o.  Suitable for discharge.  Ordered Narcan kit for patient to take home with him.        FINAL CLINICAL IMPRESSION(S) / ED DIAGNOSES   Final diagnoses:  Opiate overdose, accidental or unintentional, initial encounter [facility])     Rx / DC Orders   ED Discharge Orders     None        Note:  This document was prepared using Dragon voice recognition software and may include unintentional dictation errors.   Carrie Mew, MD 11/23/21 [phone]

## 2021-11-23 NOTE — ED Triage Notes (Signed)
[facility] after overdosing on fentanyl- snorting. EMS gave 0.5 mg of Narcan. VS [facility]. Patient Aox4.

## 2021-11-24 ENCOUNTER — Emergency Department
Admission: EM | Admit: 2021-11-24 | Discharge: 2021-11-24 | Disposition: A | Discharge status: Home / Self Care | Payer: Commercial Managed Care - HMO | Attending: Emergency Medicine | Admitting: Emergency Medicine

## 2021-11-24 DIAGNOSIS — T402X1A Poisoning by other opioids, accidental (unintentional), initial encounter: Secondary | ICD-10-CM | POA: Diagnosis present

## 2021-11-24 DIAGNOSIS — T40601A Poisoning by unspecified narcotics, accidental (unintentional), initial encounter: Secondary | ICD-10-CM

## 2021-11-24 MED ORDER — NALOXONE HCL 4 MG/0.1ML NA LIQD
1.0000 | Freq: Once | NASAL | Status: AC
  Administered 2021-11-24: 02:00:00 1 via NASAL
  Filled 2021-11-24: qty 4

## 2021-11-24 NOTE — ED Provider Notes (Signed)
[facility] Provider Note    Event Date/Time   First MD Initiated Contact with Patient 11/24/21 0133     (approximate)   History   Drug Overdose   HPI  Edwin [name] is a 36 y.o. male with past medical history of opiate use disorder presents after drug overdose.  Patient tells me that he was using cocaine earlier and then snorted heroin and "went crazy".  Did not previously use suspected heroin before.  Patient notes that he felt extremely anxious things went black and he felt like he was looking down on his body.  Apparently he was given Narcan by his friend, also called the ambulance because of his adverse reaction.  Patient denies any complaints at this time currently feeling back to baseline.  Denies any suicidal intent.  Was recently released from incarceration and apparently was here earlier today for an opiate overdose requiring Narcan as well.    Past Medical History:  Diagnosis Date   Drug overdose    DVT (deep venous thrombosis) (HCC)    Hepatitis C     Patient Active Problem List   Diagnosis Date Noted   Suicidal thoughts    Chest pain    Cannabis abuse    Major depressive disorder, recurrent episode, severe ([place]) 08/11/2018   Methamphetamine abuse ([place])    Cannabis use disorder, moderate, dependence ([place])    Tobacco use disorder 01/28/2016   Major depressive disorder, recurrent severe without psychotic features ([place])    Uncomplicated opioid dependence ([place])    Opiate or related narcotic overdose ([place]) 11/28/2015   Polysubstance abuse ([place])      Physical Exam  Triage Vital Signs: ED Triage Vitals  Enc Vitals Group     BP 11/24/21 0130 133/69     Pulse Rate 11/24/21 0130 (!) 104     Resp 11/24/21 0130 18     Temp 11/24/21 0130 98.2 F (36.8 C)     Temp Source 11/24/21 0130 Oral     SpO2 11/24/21 0129 95 %     Weight 11/24/21 0129 196 lb (88.9 kg)     Height 11/24/21 0129 5\' 8"  (1.727 m)     Head Circumference --       Peak Flow --      Pain Score 11/24/21 0128 5     Pain Loc --      Pain Edu? --      Excl. in [place]? --     Most recent vital signs: Vitals:   11/24/21 0129 11/24/21 0130  BP:  133/69  Pulse:  (!) 104  Resp:  18  Temp:  98.2 F (36.8 C)  SpO2: 95% 96%     General: Awake, no distress.  CV:  Good peripheral perfusion.  Resp:  Normal effort.  Abd:  No distention.  Neuro:             Awake, Alert, Oriented x 3  Other:     ED Results / Procedures / Treatments  Labs (all labs ordered are listed, but only abnormal results are displayed) Labs Reviewed - No data to display   EKG  EKG interpreted by myself, sinus tachycardia with normal axis normal intervals no acute ischemic changes   RADIOLOGY    PROCEDURES:  Critical Care performed: No  .1-3 Lead EKG Interpretation Performed by: Rada Hay, MD Authorized by: Rada Hay, MD     Interpretation: abnormal     ECG rate assessment: tachycardic  Rhythm: sinus tachycardia     Ectopy: none     Conduction: normal    The patient is on the cardiac monitor to evaluate for evidence of arrhythmia and/or significant heart rate changes.   MEDICATIONS ORDERED IN ED: Medications  naloxone (NARCAN) nasal spray 4 mg/0.1 mL (has no administration in time range)     IMPRESSION / MDM / ASSESSMENT AND PLAN / ED COURSE  I reviewed the triage vital signs and the nursing notes.                              Differential diagnosis includes, but is not limited to, opiate overdose, methamphetamine or cocaine overdose  Is a 36 year old male with a history of polysubstance use disorder who presents after presumed overdose.  He had been using cocaine and then snorted heroin seem to have an adverse reaction, became very anxious and had a feeling like he was looking down on his body and was going to die.  Had never used this batch of heroin before I question whether there was some adulterant.  Was given Narcan by his friend  who also called EMS.  By the time of my evaluation he is pretty much back to baseline he is alert and oriented denies acute complaints denies any suicidal intent.  He has sinus tachycardia on the monitor with otherwise normal vital signs.  Pupils are normal and he is appropriate.  He was provided with intranasal Narcan and hand at discharge and referred to [place].  No indication for further work-up at this time.     FINAL CLINICAL IMPRESSION(S) / ED DIAGNOSES   Final diagnoses:  Opiate overdose, accidental or unintentional, initial encounter [facility])     Rx / DC Orders   ED Discharge Orders     None        Note:  This document was prepared using Dragon voice recognition software and may include unintentional dictation errors.   Rada Hay, MD 11/24/21 [phone]

## 2021-11-24 NOTE — ED Notes (Signed)
Pt provided a phone to contact a ride for D/C.

## 2021-11-24 NOTE — ED Notes (Signed)
Pt provided a sandwich box and gingerale. °

## 2021-11-24 NOTE — ED Triage Notes (Addendum)
Pt presents via EMS following a drug overdose - Pt typically does heroin but believes he snorted something different which resulted in him having an "episode". He describes these episode as him "thrashing" around in the kitchen - He states that "everything went black and he had an out of body experience." He received Narcan from one of his friends tonight prior to EMS arrival. Pt was also seen here yesterday afternoon after a syncopal episode following the use of Fentanyl. Pt A&Ox4 - respirations equal and unlabored.

## 2021-12-01 ENCOUNTER — Inpatient Hospital Stay: Payer: Commercial Managed Care - HMO

## 2021-12-01 ENCOUNTER — Emergency Department: Payer: Commercial Managed Care - HMO

## 2021-12-01 ENCOUNTER — Other Ambulatory Visit: Payer: Self-pay

## 2021-12-01 ENCOUNTER — Inpatient Hospital Stay
Admission: EM | Admit: 2021-12-01 | Discharge: 2021-12-09 | DRG: 917 | Discharge status: Against Medical Advice | Payer: Commercial Managed Care - HMO | Attending: Internal Medicine | Admitting: Internal Medicine

## 2021-12-01 ENCOUNTER — Inpatient Hospital Stay (HOSPITAL_COMMUNITY)
Admit: 2021-12-01 | Discharge: 2021-12-01 | Disposition: A | Discharge status: Home / Self Care | Payer: Commercial Managed Care - HMO | Attending: Pulmonary Disease | Admitting: Pulmonary Disease

## 2021-12-01 DIAGNOSIS — I214 Non-ST elevation (NSTEMI) myocardial infarction: Secondary | ICD-10-CM

## 2021-12-01 DIAGNOSIS — N179 Acute kidney failure, unspecified: Secondary | ICD-10-CM

## 2021-12-01 DIAGNOSIS — G9341 Metabolic encephalopathy: Secondary | ICD-10-CM | POA: Diagnosis not present

## 2021-12-01 DIAGNOSIS — B999 Unspecified infectious disease: Secondary | ICD-10-CM

## 2021-12-01 DIAGNOSIS — F1410 Cocaine abuse, uncomplicated: Secondary | ICD-10-CM

## 2021-12-01 DIAGNOSIS — B182 Chronic viral hepatitis C: Secondary | ICD-10-CM

## 2021-12-01 DIAGNOSIS — T405X1A Poisoning by cocaine, accidental (unintentional), initial encounter: Secondary | ICD-10-CM

## 2021-12-01 DIAGNOSIS — D72829 Elevated white blood cell count, unspecified: Secondary | ICD-10-CM

## 2021-12-01 DIAGNOSIS — M25519 Pain in unspecified shoulder: Secondary | ICD-10-CM

## 2021-12-01 DIAGNOSIS — E875 Hyperkalemia: Secondary | ICD-10-CM

## 2021-12-01 DIAGNOSIS — R7989 Other specified abnormal findings of blood chemistry: Secondary | ICD-10-CM

## 2021-12-01 DIAGNOSIS — N170 Acute kidney failure with tubular necrosis: Secondary | ICD-10-CM

## 2021-12-01 DIAGNOSIS — R778 Other specified abnormalities of plasma proteins: Secondary | ICD-10-CM

## 2021-12-01 DIAGNOSIS — B179 Acute viral hepatitis, unspecified: Secondary | ICD-10-CM

## 2021-12-01 DIAGNOSIS — R609 Edema, unspecified: Secondary | ICD-10-CM

## 2021-12-01 DIAGNOSIS — M609 Myositis, unspecified: Secondary | ICD-10-CM

## 2021-12-01 DIAGNOSIS — M6282 Rhabdomyolysis: Secondary | ICD-10-CM

## 2021-12-01 DIAGNOSIS — G928 Other toxic encephalopathy: Secondary | ICD-10-CM | POA: Diagnosis present

## 2021-12-01 DIAGNOSIS — M60852 Other myositis, left thigh: Secondary | ICD-10-CM | POA: Diagnosis not present

## 2021-12-01 DIAGNOSIS — M60005 Infective myositis, unspecified leg: Secondary | ICD-10-CM | POA: Diagnosis not present

## 2021-12-01 DIAGNOSIS — M60009 Infective myositis, unspecified site: Secondary | ICD-10-CM | POA: Diagnosis not present

## 2021-12-01 DIAGNOSIS — H9193 Unspecified hearing loss, bilateral: Secondary | ICD-10-CM | POA: Diagnosis present

## 2021-12-01 DIAGNOSIS — M25512 Pain in left shoulder: Secondary | ICD-10-CM | POA: Diagnosis present

## 2021-12-01 DIAGNOSIS — Z20822 Contact with and (suspected) exposure to covid-19: Secondary | ICD-10-CM | POA: Diagnosis present

## 2021-12-01 DIAGNOSIS — E871 Hypo-osmolality and hyponatremia: Secondary | ICD-10-CM | POA: Diagnosis present

## 2021-12-01 DIAGNOSIS — M25462 Effusion, left knee: Secondary | ICD-10-CM | POA: Diagnosis present

## 2021-12-01 DIAGNOSIS — Z888 Allergy status to other drugs, medicaments and biological substances status: Secondary | ICD-10-CM | POA: Diagnosis not present

## 2021-12-01 DIAGNOSIS — Z86718 Personal history of other venous thrombosis and embolism: Secondary | ICD-10-CM | POA: Diagnosis not present

## 2021-12-01 DIAGNOSIS — Z885 Allergy status to narcotic agent status: Secondary | ICD-10-CM | POA: Diagnosis not present

## 2021-12-01 DIAGNOSIS — E872 Acidosis, unspecified: Secondary | ICD-10-CM | POA: Diagnosis present

## 2021-12-01 DIAGNOSIS — K7290 Hepatic failure, unspecified without coma: Secondary | ICD-10-CM | POA: Diagnosis present

## 2021-12-01 DIAGNOSIS — M25452 Effusion, left hip: Secondary | ICD-10-CM | POA: Diagnosis present

## 2021-12-01 LAB — BLOOD GAS, ARTERIAL
Acid-base deficit: 4.5 mmol/L — ABNORMAL HIGH (ref 0.0–2.0)
Bicarbonate: 21.7 mmol/L (ref 20.0–28.0)
FIO2: 0.21
O2 Saturation: 93.9 %
Patient temperature: 37
pCO2 arterial: 43 mmHg (ref 32.0–48.0)
pH, Arterial: 7.31 — ABNORMAL LOW (ref 7.350–7.450)
pO2, Arterial: 77 mmHg — ABNORMAL LOW (ref 83.0–108.0)

## 2021-12-01 LAB — ECHOCARDIOGRAM COMPLETE
AR max vel: 2.62 cm2
AV Area VTI: 2.77 cm2
AV Area mean vel: 2.64 cm2
AV Mean grad: 2 mmHg
AV Peak grad: 3.7 mmHg
Ao pk vel: 0.96 m/s
Area-P 1/2: 4.96 cm2
Height: 72 in
MV VTI: 2.43 cm2
S' Lateral: 2.7 cm
Weight: 2864.22 oz

## 2021-12-01 LAB — BASIC METABOLIC PANEL
Anion gap: 8 (ref 5–15)
Anion gap: 11 (ref 5–15)
BUN: 53 mg/dL — ABNORMAL HIGH (ref 6–20)
BUN: 59 mg/dL — ABNORMAL HIGH (ref 6–20)
CO2: 22 mmol/L (ref 22–32)
CO2: 19 mmol/L — ABNORMAL LOW (ref 22–32)
Calcium: 6.8 mg/dL — ABNORMAL LOW (ref 8.9–10.3)
Calcium: 6.7 mg/dL — ABNORMAL LOW (ref 8.9–10.3)
Chloride: 101 mmol/L (ref 98–111)
Chloride: 101 mmol/L (ref 98–111)
Creatinine, Ser: 3.53 mg/dL — ABNORMAL HIGH (ref 0.61–1.24)
Creatinine, Ser: 4.03 mg/dL — ABNORMAL HIGH (ref 0.61–1.24)
GFR, Estimated: 22 mL/min — ABNORMAL LOW (ref >60)
GFR, Estimated: 19 mL/min — ABNORMAL LOW (ref >60)
Glucose, Bld: 110 mg/dL — ABNORMAL HIGH (ref 70–99)
Glucose, Bld: 97 mg/dL (ref 70–99)
Potassium: 5.1 mmol/L (ref 3.5–5.1)
Potassium: 4.6 mmol/L (ref 3.5–5.1)
Sodium: 131 mmol/L — ABNORMAL LOW (ref 135–145)
Sodium: 131 mmol/L — ABNORMAL LOW (ref 135–145)

## 2021-12-01 LAB — CBC WITH DIFFERENTIAL/PLATELET
Abs Immature Granulocytes: 0.2 10*3/uL — ABNORMAL HIGH (ref 0.00–0.07)
Basophils Absolute: 0.1 10*3/uL (ref 0.0–0.1)
Basophils Relative: 0 %
Eosinophils Absolute: 0 10*3/uL (ref 0.0–0.5)
Eosinophils Relative: 0 %
HCT: 47.3 % (ref 39.0–52.0)
Hemoglobin: 15.9 g/dL (ref 13.0–17.0)
Immature Granulocytes: 1 %
Lymphocytes Relative: 5 %
Lymphs Abs: 1.1 10*3/uL (ref 0.7–4.0)
MCH: 31 pg (ref 26.0–34.0)
MCHC: 33.6 g/dL (ref 30.0–36.0)
MCV: 92.2 fL (ref 80.0–100.0)
Monocytes Absolute: 1.3 10*3/uL — ABNORMAL HIGH (ref 0.1–1.0)
Monocytes Relative: 6 %
Neutro Abs: 18.7 10*3/uL — ABNORMAL HIGH (ref 1.7–7.7)
Neutrophils Relative %: 88 %
Platelets: 329 10*3/uL (ref 150–400)
RBC: 5.13 MIL/uL (ref 4.22–5.81)
RDW: 12.4 % (ref 11.5–15.5)
WBC: 21.4 10*3/uL — ABNORMAL HIGH (ref 4.0–10.5)
nRBC: 0 % (ref 0.0–0.2)

## 2021-12-01 LAB — CK: Total CK: >50000 U/L — ABNORMAL HIGH (ref 49–397)

## 2021-12-01 LAB — ACETAMINOPHEN LEVEL
Acetaminophen (Tylenol), Serum: 19 ug/mL (ref 10–30)
Acetaminophen (Tylenol), Serum: <10 ug/mL — ABNORMAL LOW (ref 10–30)

## 2021-12-01 LAB — COMPREHENSIVE METABOLIC PANEL
ALT: 725 U/L — ABNORMAL HIGH (ref 0–44)
AST: 1510 U/L — ABNORMAL HIGH (ref 15–41)
Albumin: 3.9 g/dL (ref 3.5–5.0)
Alkaline Phosphatase: 105 U/L (ref 38–126)
Anion gap: 15 (ref 5–15)
BUN: 46 mg/dL — ABNORMAL HIGH (ref 6–20)
CO2: 22 mmol/L (ref 22–32)
Calcium: 7.3 mg/dL — ABNORMAL LOW (ref 8.9–10.3)
Chloride: 96 mmol/L — ABNORMAL LOW (ref 98–111)
Creatinine, Ser: 3.28 mg/dL — ABNORMAL HIGH (ref 0.61–1.24)
GFR, Estimated: 24 mL/min — ABNORMAL LOW (ref >60)
Glucose, Bld: 210 mg/dL — ABNORMAL HIGH (ref 70–99)
Potassium: 6.6 mmol/L (ref 3.5–5.1)
Sodium: 133 mmol/L — ABNORMAL LOW (ref 135–145)
Total Bilirubin: 0.3 mg/dL (ref 0.3–1.2)
Total Protein: 8.6 g/dL — ABNORMAL HIGH (ref 6.5–8.1)

## 2021-12-01 LAB — HEPATITIS PANEL, ACUTE
HCV Ab: REACTIVE — AB
Hep A IgM: NONREACTIVE
Hep B C IgM: NONREACTIVE
Hepatitis B Surface Ag: NONREACTIVE

## 2021-12-01 LAB — URINALYSIS, COMPLETE (UACMP) WITH MICROSCOPIC
Specific Gravity, Urine: 1.016 (ref 1.005–1.030)
Squamous Epithelial / HPF: NONE SEEN (ref 0–5)

## 2021-12-01 LAB — TROPONIN I (HIGH SENSITIVITY)
Troponin I (High Sensitivity): 1334 ng/L (ref <18)
Troponin I (High Sensitivity): 2411 ng/L (ref <18)
Troponin I (High Sensitivity): 2444 ng/L (ref <18)
Troponin I (High Sensitivity): 1794 ng/L (ref <18)

## 2021-12-01 LAB — PROCALCITONIN
Procalcitonin: 9.18 ng/mL
Procalcitonin: 12.5 ng/mL

## 2021-12-01 LAB — URINE DRUG SCREEN, QUALITATIVE (ARMC ONLY)
Amphetamines, Ur Screen: NOT DETECTED
Barbiturates, Ur Screen: NOT DETECTED
Benzodiazepine, Ur Scrn: POSITIVE — AB
Cannabinoid 50 Ng, Ur ~~LOC~~: NOT DETECTED
Cocaine Metabolite,Ur ~~LOC~~: POSITIVE — AB
MDMA (Ecstasy)Ur Screen: NOT DETECTED
Methadone Scn, Ur: NOT DETECTED
Opiate, Ur Screen: NOT DETECTED
Phencyclidine (PCP) Ur S: NOT DETECTED
Tricyclic, Ur Screen: NOT DETECTED

## 2021-12-01 LAB — GLUCOSE, CAPILLARY: Glucose-Capillary: 123 mg/dL — ABNORMAL HIGH (ref 70–99)

## 2021-12-01 LAB — LACTIC ACID, PLASMA
Lactic Acid, Venous: 3.2 mmol/L (ref 0.5–1.9)
Lactic Acid, Venous: 2.4 mmol/L (ref 0.5–1.9)
Lactic Acid, Venous: 2.4 mmol/L (ref 0.5–1.9)
Lactic Acid, Venous: 2.2 mmol/L (ref 0.5–1.9)

## 2021-12-01 LAB — MRSA NEXT GEN BY PCR, NASAL: MRSA by PCR Next Gen: NOT DETECTED

## 2021-12-01 LAB — HIV ANTIBODY (ROUTINE TESTING W REFLEX): HIV Screen 4th Generation wRfx: NONREACTIVE

## 2021-12-01 LAB — APTT: aPTT: 32 seconds (ref 24–36)

## 2021-12-01 LAB — RESP PANEL BY RT-PCR (FLU A&B, COVID) ARPGX2
Influenza A by PCR: NEGATIVE
Influenza B by PCR: NEGATIVE
SARS Coronavirus 2 by RT PCR: NEGATIVE

## 2021-12-01 LAB — MAGNESIUM: Magnesium: 2.6 mg/dL — ABNORMAL HIGH (ref 1.7–2.4)

## 2021-12-01 LAB — PROTIME-INR
INR: 1.3 — ABNORMAL HIGH (ref 0.8–1.2)
Prothrombin Time: 15.8 seconds — ABNORMAL HIGH (ref 11.4–15.2)

## 2021-12-01 LAB — SALICYLATE LEVEL: Salicylate Lvl: <7 mg/dL — ABNORMAL LOW (ref 7.0–30.0)

## 2021-12-01 LAB — OSMOLALITY: Osmolality: 307 mOsm/kg — ABNORMAL HIGH (ref 275–295)

## 2021-12-01 LAB — HEPARIN LEVEL (UNFRACTIONATED): Heparin Unfractionated: <0.1 IU/mL — ABNORMAL LOW (ref 0.30–0.70)

## 2021-12-01 LAB — AMMONIA: Ammonia: 21 umol/L (ref 9–35)

## 2021-12-01 LAB — LIPASE, BLOOD: Lipase: 41 U/L (ref 11–51)

## 2021-12-01 LAB — ETHANOL: Alcohol, Ethyl (B): <10 mg/dL (ref <10)

## 2021-12-01 MED ORDER — HEPARIN SODIUM (PORCINE) 5000 UNIT/ML IJ SOLN
5000.0000 [IU] | Freq: Three times a day (TID) | INTRAMUSCULAR | Status: DC
  Administered 2021-12-01: 5000 [IU] via SUBCUTANEOUS
  Filled 2021-12-01: qty 1

## 2021-12-01 MED ORDER — CHLORHEXIDINE GLUCONATE CLOTH 2 % EX PADS
6.0000 | MEDICATED_PAD | Freq: Every day | CUTANEOUS | Status: DC
  Administered 2021-12-01 – 2021-12-05 (×5): 6 via TOPICAL

## 2021-12-01 MED ORDER — HEPARIN BOLUS VIA INFUSION
2400.0000 [IU] | Freq: Once | INTRAVENOUS | Status: AC
  Administered 2021-12-01: 2400 [IU] via INTRAVENOUS
  Filled 2021-12-01: qty 2400

## 2021-12-01 MED ORDER — SODIUM CHLORIDE 0.9 % IV SOLN
2.0000 g | Freq: Two times a day (BID) | INTRAVENOUS | Status: DC
  Filled 2021-12-01 (×2): qty 2

## 2021-12-01 MED ORDER — VANCOMYCIN VARIABLE DOSE PER UNSTABLE RENAL FUNCTION (PHARMACIST DOSING): Status: DC

## 2021-12-01 MED ORDER — SODIUM CHLORIDE 0.9 % IV BOLUS
1000.0000 mL | Freq: Once | INTRAVENOUS | Status: AC
Start: 2021-12-01 — End: 2021-12-01
  Administered 2021-12-01: 1000 mL via INTRAVENOUS

## 2021-12-01 MED ORDER — SODIUM CHLORIDE 0.9 % IV SOLN: INTRAVENOUS | Status: DC

## 2021-12-01 MED ORDER — SODIUM CHLORIDE 0.9 % IV BOLUS
1000.0000 mL | Freq: Once | INTRAVENOUS | Status: AC
  Administered 2021-12-01: 1000 mL via INTRAVENOUS

## 2021-12-01 MED ORDER — PIPERACILLIN-TAZOBACTAM 3.375 G IVPB
3.3750 g | Freq: Three times a day (TID) | INTRAVENOUS | Status: DC
  Administered 2021-12-01 – 2021-12-03 (×5): 3.375 g via INTRAVENOUS
  Filled 2021-12-01 (×5): qty 50

## 2021-12-01 MED ORDER — HEPARIN (PORCINE) 25000 UT/250ML-% IV SOLN
2100.0000 [IU]/h | INTRAVENOUS | Status: DC
  Administered 2021-12-01: 1000 [IU]/h via INTRAVENOUS
  Administered 2021-12-02: 1250 [IU]/h via INTRAVENOUS
  Administered 2021-12-03: 1850 [IU]/h via INTRAVENOUS
  Filled 2021-12-01 (×4): qty 250

## 2021-12-01 MED ORDER — VANCOMYCIN HCL 2000 MG/400ML IV SOLN
2000.0000 mg | Freq: Once | INTRAVENOUS | Status: AC
  Administered 2021-12-01: 2000 mg via INTRAVENOUS
  Filled 2021-12-01: qty 400

## 2021-12-01 MED ORDER — CLINDAMYCIN PHOSPHATE 600 MG/50ML IV SOLN
600.0000 mg | Freq: Three times a day (TID) | INTRAVENOUS | Status: DC
  Administered 2021-12-01 – 2021-12-03 (×6): 600 mg via INTRAVENOUS
  Filled 2021-12-01 (×7): qty 50

## 2021-12-01 MED ORDER — SODIUM ZIRCONIUM CYCLOSILICATE 5 G PO PACK
10.0000 g | PACK | Freq: Three times a day (TID) | ORAL | Status: DC
  Administered 2021-12-01 – 2021-12-02 (×3): 10 g via ORAL
  Filled 2021-12-01 (×2): qty 2

## 2021-12-01 MED ORDER — FENTANYL CITRATE PF 50 MCG/ML IJ SOSY
25.0000 ug | PREFILLED_SYRINGE | INTRAMUSCULAR | Status: DC | PRN
Start: 2021-12-01 — End: 2021-12-03
  Administered 2021-12-01 – 2021-12-03 (×15): 25 ug via INTRAVENOUS
  Filled 2021-12-01 (×16): qty 1

## 2021-12-01 MED ORDER — DIAZEPAM 5 MG/ML IJ SOLN
2.5000 mg | Freq: Four times a day (QID) | INTRAMUSCULAR | Status: DC | PRN
  Administered 2021-12-01 – 2021-12-09 (×25): 2.5 mg via INTRAVENOUS
  Filled 2021-12-01 (×25): qty 2

## 2021-12-01 MED ORDER — SODIUM ZIRCONIUM CYCLOSILICATE 5 G PO PACK
10.0000 g | PACK | ORAL | Status: AC
  Filled 2021-12-01: qty 1
  Filled 2021-12-01: qty 2

## 2021-12-01 MED ORDER — ISOSORBIDE MONONITRATE ER 30 MG PO TB24
15.0000 mg | ORAL_TABLET | Freq: Every day | ORAL | Status: DC
  Administered 2021-12-01 – 2021-12-05 (×5): 15 mg via ORAL
  Filled 2021-12-01 (×5): qty 1

## 2021-12-01 MED ORDER — HALOPERIDOL LACTATE 5 MG/ML IJ SOLN
2.0000 mg | Freq: Four times a day (QID) | INTRAMUSCULAR | Status: DC | PRN
  Administered 2021-12-07: 2 mg via INTRAVENOUS
  Filled 2021-12-01: qty 1

## 2021-12-01 MED ORDER — DOCUSATE SODIUM 100 MG PO CAPS: 100.0000 mg | ORAL_CAPSULE | Freq: Two times a day (BID) | ORAL | Status: DC | PRN

## 2021-12-01 MED ORDER — POLYETHYLENE GLYCOL 3350 17 G PO PACK: 17.0000 g | PACK | Freq: Every day | ORAL | Status: DC | PRN

## 2021-12-01 MED ORDER — SODIUM BICARBONATE 8.4 % IV SOLN
50.0000 meq | Freq: Once | INTRAVENOUS | Status: AC
  Administered 2021-12-01: 50 meq via INTRAVENOUS
  Filled 2021-12-01: qty 50

## 2021-12-01 MED ORDER — ALBUTEROL SULFATE (2.5 MG/3ML) 0.083% IN NEBU
10.0000 mg | INHALATION_SOLUTION | Freq: Once | RESPIRATORY_TRACT | Status: AC
  Administered 2021-12-01: 10 mg via RESPIRATORY_TRACT
  Filled 2021-12-01: qty 12

## 2021-12-01 MED ORDER — PANTOPRAZOLE SODIUM 40 MG IV SOLR
40.0000 mg | INTRAVENOUS | Status: DC
  Administered 2021-12-01 – 2021-12-03 (×3): 40 mg via INTRAVENOUS
  Filled 2021-12-01 (×3): qty 40

## 2021-12-01 MED ORDER — CALCIUM GLUCONATE 10 % IV SOLN
1.0000 g | Freq: Once | INTRAVENOUS | Status: AC
  Administered 2021-12-01: 1 g via INTRAVENOUS
  Filled 2021-12-01: qty 10

## 2021-12-01 MED ORDER — MIDAZOLAM HCL 2 MG/2ML IJ SOLN
1.0000 mg | Freq: Once | INTRAMUSCULAR | Status: AC
Start: 2021-12-01 — End: 2021-12-01
  Administered 2021-12-01: 1 mg via INTRAVENOUS
  Filled 2021-12-01: qty 2

## 2021-12-01 NOTE — Progress Notes (Addendum)
Pt remains altered and confused, agitated at times.  Has made comments previously of "let me leave." Pt has multiple metabolic derangements (severe AKI w/ Hyperkalemia, Rhabdo, liver failure), Elevated troponin in setting of NSTEMI vs demand ischemia.  UDS is positive for cocaine.   Discussed with Dr. Belia Heman, given pt's multiple metabolic derangements, pt unlikely able to competently make medical decisions at this time in the event he were to want to leave. Will consult Psychiatry to evaluate whether pt needs IVC.  Have attempted to reach pt's only listed contact for update, his Father [name].  No answer, and voice mailbox is full.   Harlon Ditty, AGACNP-BC [facility] Prefer epic messenger for cross cover needs If after hours, please call E-link

## 2021-12-01 NOTE — H&P (Signed)
NAME:  Edwin [name], MRN:  [id number], DOB:  December 01, 1985, LOS: 0 ADMISSION DATE:  12/01/2021, CONSULTATION DATE:  12/01/21 REFERRING MD:  Dr. York CeriseForbach, CHIEF COMPLAINT:  Altered mental status   Brief Pt Description / Synopsis:  36 year old male with past medical history significant for polysubstance abuse admitted for acute metabolic encephalopathy in the setting of multiple severe metabolic derangements (AKI with Hyperkalemia, Rhabdomyolysis, liver failure) plus cocaine use.  Also found to have elevated troponin.  Nephrology and Cardiology following.  History of Present Illness:  Edwin [name] is a 36 year old male with no significant past medical history other than drug abuse, who [facility] ED on 12/01/2021 due to altered mental status.  Patient is currently altered and no family is available, therefore history is obtained from chart review.  Per notes, EMS was dispatched as the patient woke up agitated and pulling out handfuls of his hair and thrashing around.  Upon EMS arrival he was disabled, foul-smelling having recently urinated on himself, and agitated.    Upon arrival to the ED he is able to focus on people in the room and give thumbs up, but does not answer questions or follow commands.  The only thing he would say is let me leave here.  He was picking off EKG leads and attempting to remove his IV.  ED Course: Initial vital signs: Respiratory rate 12, pulse 91, blood pressure 127/110, SPO2 96% on room air Significant labs: Sodium 133, potassium 6.6, chloride 96, bicarbonate 22, BUN 46, creatinine 3.28, glucose 210, alkaline phosphatase 105, AST 1510, ALT 725, Tylenol 19, salicylates less than 7, ethyl alcohol less than 10, WBC 21.4 with neutrophilia COVID-19 PCR is pending Urinalysis pending UDS + cocaine Imaging: CT head: IMPRESSION: 1. No acute intracranial pathology. 2. Age-indeterminate fracture of the anterior left maxillary sinus wall. Chest x-ray: Cardiomediastinal  contours and hilar structures with suggestion of cardiac enlargement and with central pulmonary vascular engorgement. No lobar consolidation. No sign of pleural effusion. No visible pneumothorax. Right upper quadrant ultrasound: IMPRESSION: Normal right upper quadrant ultrasound. Medications given: 1 amp sodium bicarbonate, albuterol, calcium gluconate, 2 L normal saline, Lokelma, 1 mg Versed  ED provider Dr. York CeriseForbach spoke with Dr. Lourdes SledgeLatif of nephrology, agreed with temporizing measures as above.  PCCM is asked admit the patient to ICU for further work-up and treatment of acute metabolic encephalopathy in the setting of multiple severe metabolic derangements and suspected drug abuse.  Pertinent  Medical History  Polysubstance abuse  Micro Data:  2/1: SARS-CoV-2 & Influenza PCR>> 2/1: Blood culture x2>>  Antimicrobials:  N/A  Significant Hospital Events: Including procedures, antibiotic start and stop dates in addition to other pertinent events   2/1: PCCM asked to admit.  Nephrology & Cardiology consulted  Interim History / Subjective:  -Presented to ED altered, found to have multiple metabolic derangements -CT head is negative -Urine drug screen is + for cocaine -HS troponin approximately 1300 ~cardiology consulted ~ discussed with Cardiology, will start Heparin gtt for 48 hrs (no bolus) -Remains altered, but currently protecting airway -High risk for decompensation  Objective   Blood pressure (!) 127/110, pulse 91, resp. rate 12, weight 87.4 kg, SpO2 96 %.       No intake or output data in the 24 hours ending 12/01/21 0822 Filed Weights   12/01/21 0549  Weight: 87.4 kg    Examination: General: Acutely ill-appearing male, laying in bed, altered, but in no acute distress, on room air HENT: Atraumatic, normocephalic, neck supple, no JVD  Lungs: Clear breath sounds bilaterally, even, nonlabored, no accessory muscle use Cardiovascular: Regular rate and rhythm, S1-S2, no  murmurs, rubs, gallops Abdomen: Soft, nontender, nondistended, no guarding rebound tenderness, bowel sounds positive x4 Extremities: Normal bulk and tone, no deformities, no edema, 2+ distal pulses Neuro: Awake, intermittently agitated versus sleeping, moves all extremities, but does not follow commands, pupils PERRLA.  Is able to turn his head side to side and flex and extend his head without difficulty, exhibits no signs of meningitis GU: Deferred, nursing to place Foley for strict I&O's given AKI  Resolved Hospital Problem list     Assessment & Plan:   Acute Metabolic Encephalopathy in setting of multiple metabolic derangements & cocaine use -Provide supportive care -Treat metabolic derangements as below -CT Head negative for acute intracranial abnormality -Check ABG ~ no hypercapnia (pH 7.31/pCO2 43/pO2 77/Bicarb 21.7) -UDS + for cocaine and Benzodiazepines -Serum tylenol, salicylates, & Ethyl alcohol all negative -NPO for now with aspiration precautions until mental status improved  Acute Kidney Injury Rhabdomyolysis Severe Hyperkalemia Hyponatremia -Monitor I&O's / urinary output -Follow BMP -Ensure adequate renal perfusion -Avoid nephrotoxic agents as able -Replace electrolytes as indicated -IV fluids -Received Bicarb x1, Albuterol, Calcium gluconate, and Lokelma ~ follow up BMP @ 14:00 this afternoon -Nephrology consulted, appreciate input -Trend CK  Elevated Troponin, demand ischemia vs NSTEMI in setting of Cocaine abuse Prolonged QTc, suspect due to Hyperkalemia +/- suspected drug abuse -Continuous cardiac monitoring -Serial EKG's -Maintain MAP >65 -IV fluids -Vasopressors as needed to maintain MAP goal -Trend lactic acid until normalized -Trend HS Troponin until peaked -Echocardiogram pending -Cardiology consulted, appreciate input ~ discussed with Cardiology, recommends Heparin gtt for 48 hrs (NO bolus)  Elevated LFT's -Trend LFT's and bleeding times -Serum  tylenol level normal at 19 ~ will recheck again later this afternoon to be sure not trending up -Check acute Hepatitis panel -Check RUQ [place] ~ Normal [place]  Leukocytosis, currently unknown etiology -Monitor fever curve -Trend WBC's & Procalcitonin -Follow cultures as above -Will hold off on empiric ABX for now pending PCT, CXR, and UA -CXR without evidence of PNA -Physical Exam not consistent with Meningitis    Best Practice (right click and "Reselect all SmartList Selections" daily)   Diet/type: NPO DVT prophylaxis: Heparin gtt GI prophylaxis: PPI Lines: N/A Foley:  yes and is still needed Code Status:  full code Last date of multidisciplinary goals of care discussion [12/01/21]  Labs   CBC: Recent Labs  Lab 12/01/21 0607  WBC 21.4*  NEUTROABS 18.7*  HGB 15.9  HCT 47.3  MCV 92.2  PLT 329    Basic Metabolic Panel: Recent Labs  Lab 12/01/21 0607  NA 133*  K 6.6*  CL 96*  CO2 22  GLUCOSE 210*  BUN 46*  CREATININE 3.28*  CALCIUM 7.3*  MG 2.6*   GFR: CrCl cannot be calculated (Unknown ideal weight.). Recent Labs  Lab 12/01/21 0607  WBC 21.4*    Liver Function Tests: Recent Labs  Lab 12/01/21 0607  AST 1,510*  ALT 725*  ALKPHOS 105  BILITOT 0.3  PROT 8.6*  ALBUMIN 3.9   Recent Labs  Lab 12/01/21 0607  LIPASE 41   Recent Labs  Lab 12/01/21 0607  AMMONIA 21    ABG    Component Value Date/Time   PHART 7.31 (L) 12/01/2021 0713   PCO2ART 43 12/01/2021 0713   PO2ART 77 (L) 12/01/2021 0713   HCO3 21.7 12/01/2021 0713   ACIDBASEDEF 4.5 (H) 12/01/2021 0713   O2SAT  93.9 12/01/2021 0713     Coagulation Profile: No results for input(s): INR, PROTIME in the last 168 hours.  Cardiac Enzymes: No results for input(s): CKTOTAL, CKMB, CKMBINDEX, TROPONINI in the last 168 hours.  HbA1C: No results found for: HGBA1C  CBG: No results for input(s): GLUCAP in the last 168 hours.  Review of Systems:   Unable to assess due to AMS  Past Medical  History:  He,  has no past medical history on file.   Surgical History:     Social History:      Family History:  His family history is not on file.   Allergies No Known Allergies   Home Medications  Prior to Admission medications   Not on File     Critical care time: 60 minutes     Harlon Ditty, AGACNP-BC [facility] Prefer epic messenger for cross cover needs If after hours, please call E-link

## 2021-12-01 NOTE — Consult Note (Signed)
[facility] Consult   Reason for Consult: Consult for 36 year old man brought into the hospital because of altered mental status and confusion Referring Physician:Kasa Patient Identification: Edwin [name] MRN:  [id number] Principal Diagnosis: Acute metabolic encephalopathy Diagnosis:  Principal Problem:   Acute metabolic encephalopathy Active Problems:   Non-ST elevation (NSTEMI) myocardial infarction ([place])   Cocaine abuse ([place])   Total Time spent with patient: 45 minutes  Subjective:   Edwin [name] is a 36 y.o. male patient admitted with "I cannot hear anything".  HPI: 35 year old man with a long history of substance abuse problem was brought to the hospital after friends found the patient agitated and confused after apparently being up all night.  Was confused and disorganized when he came into the hospital but was admitted to the emergency room because of abnormal labs including elevated troponin and elevated BUN and creatinine.  On interview found the patient awake although a little bit drowsy.  Father was also present but was not able to provide any other information.  Patient's chief complaint was that he was not able to hear.  Nursing was aware of this and said patient had been complaining of it since coming to the ICU.  Patient reported that he could not hear speech at normal volumes but could hear a little bit of his own voice and could hear when I shouted at him.  He was awake and alert however and able to read and reply to written questions.  Patient reported that he had no memory of how he came to be in the hospital.  He initially claimed that he had not been using any drugs whatsoever.  When he was informed that there was cocaine in his drug screen he continued to insist having no memory of it.  Patient stated several times to me that he had not been recently using any fentanyl narcotics or any other drugs.  He did not present as confused.  He was oriented to his  situation and to his father being there.  Able to ask appropriate questions.  Basically cooperative with staff.  Also complaining of pain and swelling especially in his left leg.  Past Psychiatric History: Patient has a long history of substance abuse.  Multiple visits to the ER for opiate overdoses and abuse of other drugs.  Had a couple of psychiatric hospitalizations in 2019 also related to substance abuse and mood instability.  No history of psychosis.  No known history of violence.  Recently had not been reporting any suicidal thoughts.  No known past suicide attempts.  Risk to Self:   Risk to Others:   Prior Inpatient Therapy:   Prior Outpatient Therapy:    Past Medical History: No past medical history on file.  Family History: No family history on file. Family Psychiatric  History: None reported Social History:  Social History   Substance and Sexual Activity  Alcohol Use Not on file     Social History   Substance and Sexual Activity  Drug Use Not on file    Social History   Socioeconomic History   Marital status: Single    Spouse name: Not on file   Number of children: Not on file   Years of education: Not on file   Highest education level: Not on file  Occupational History   Not on file  Tobacco Use   Smoking status: Not on file   Smokeless tobacco: Not on file  Substance and Sexual Activity   Alcohol use: Not  on file   Drug use: Not on file   Sexual activity: Not on file  Other Topics Concern   Not on file  Social History Narrative   Not on file   Social Determinants of Health   Financial Resource Strain: Not on file  Food Insecurity: Not on file  Transportation Needs: Not on file  Physical Activity: Not on file  Stress: Not on file  Social Connections: Not on file   Additional Social History:    Allergies:  No Known Allergies  Labs:  Results for orders placed or performed during the hospital encounter of 12/01/21 (from the past 48 hour(s))   Ethanol     Status: None   Collection Time: 12/01/21  6:07 AM  Result Value Ref Range   Alcohol, Ethyl (B) <10 <10 mg/dL    Comment: (NOTE) Lowest detectable limit for serum alcohol is 10 mg/dL.  For medical purposes only. Performed at [facility], [address] [place], [place] [address]   Ammonia     Status: None   Collection Time: 12/01/21  6:07 AM  Result Value Ref Range   Ammonia 21 9 - 35 umol/L    Comment: Performed at [facility], [address] [place], [place] [address]  CBC with Differential/Platelet     Status: Abnormal   Collection Time: 12/01/21  6:07 AM  Result Value Ref Range   WBC 21.4 (H) 4.0 - 10.5 K/uL   RBC 5.13 4.22 - 5.81 MIL/uL   Hemoglobin 15.9 13.0 - 17.0 g/dL   HCT 47.3 39.0 - 52.0 %   MCV 92.2 80.0 - 100.0 fL   MCH 31.0 26.0 - 34.0 pg   MCHC 33.6 30.0 - 36.0 g/dL   RDW 12.4 11.5 - 15.5 %   Platelets 329 150 - 400 K/uL   nRBC 0.0 0.0 - 0.2 %   Neutrophils Relative % 88 %   Neutro Abs 18.7 (H) 1.7 - 7.7 K/uL   Lymphocytes Relative 5 %   Lymphs Abs 1.1 0.7 - 4.0 K/uL   Monocytes Relative 6 %   Monocytes Absolute 1.3 (H) 0.1 - 1.0 K/uL   Eosinophils Relative 0 %   Eosinophils Absolute 0.0 0.0 - 0.5 K/uL   Basophils Relative 0 %   Basophils Absolute 0.1 0.0 - 0.1 K/uL   Immature Granulocytes 1 %   Abs Immature Granulocytes 0.20 (H) 0.00 - 0.07 K/uL    Comment: Performed at [facility], [address] [place],  [address]  Comprehensive metabolic panel     Status: Abnormal   Collection Time: 12/01/21  6:07 AM  Result Value Ref Range   Sodium 133 (L) 135 - 145 mmol/L   Potassium 6.6 (HH) 3.5 - 5.1 mmol/L    Comment: CRITICAL RESULT CALLED TO, READ BACK BY AND VERIFIED WITH STEPHENY RUDD@0656  12/01/21 RH    Chloride 96 (L) 98 - 111 mmol/L   CO2 22 22 - 32 mmol/L   Glucose, Bld 210 (H) 70 - 99 mg/dL    Comment: Glucose reference range applies only to samples taken after fasting for at least 8 hours.    BUN 46 (H) 6 - 20 mg/dL   Creatinine, Ser 3.28 (H) 0.61 - 1.24 mg/dL   Calcium 7.3 (L) 8.9 - 10.3 mg/dL   Total Protein 8.6 (H) 6.5 - 8.1 g/dL   Albumin 3.9 3.5 - 5.0 g/dL   AST 1,510 (H) 15 - 41 U/L   ALT 725 (H) 0 -  44 U/L   Alkaline Phosphatase 105 38 - 126 U/L   Total Bilirubin 0.3 0.3 - 1.2 mg/dL   GFR, Estimated 24 (L) >60 mL/min    Comment: (NOTE) Calculated using the CKD-EPI Creatinine Equation (2021)    Anion gap 15 5 - 15    Comment: Performed at [facility], [address] [place], [place] [address]  Lipase, blood     Status: None   Collection Time: 12/01/21  6:07 AM  Result Value Ref Range   Lipase 41 11 - 51 U/L    Comment: Performed at [facility], [address] [place], [place] [address]  Salicylate level     Status: Abnormal   Collection Time: 12/01/21  6:07 AM  Result Value Ref Range   Salicylate Lvl <7.0 (L) 7.0 - 30.0 mg/dL    Comment: Performed at [facility], [address] [place], [place] [address]  Acetaminophen level     Status: None   Collection Time: 12/01/21  6:07 AM  Result Value Ref Range   Acetaminophen (Tylenol), Serum 19 10 - 30 ug/mL    Comment: (NOTE) Therapeutic concentrations vary significantly. A range of 10-30 ug/mL  may be an effective concentration for many patients. However, some  are best treated at concentrations outside of this range. Acetaminophen concentrations >150 ug/mL at 4 hours after ingestion  and >50 ug/mL at 12 hours after ingestion are often associated with  toxic reactions.  Performed at [facility], [address] [place], [place] [address]   Magnesium     Status: Abnormal   Collection Time: 12/01/21  6:07 AM  Result Value Ref Range   Magnesium 2.6 (H) 1.7 - 2.4 mg/dL    Comment: Performed at [facility], [address] [place], [place] [address]  Osmolality     Status: Abnormal   Collection Time: 12/01/21  6:07 AM  Result Value Ref Range   Osmolality  307 (H) 275 - 295 mOsm/kg    Comment: Performed at [facility], [address] [place], [place] [address]  CK     Status: Abnormal   Collection Time: 12/01/21  6:07 AM  Result Value Ref Range   Total CK >50,000 (H) 49 - 397 U/L    Comment: RESULT CONFIRMED BY MANUAL DILUTION/HKP Performed at [facility], [address] [place], [place] [address]   Procalcitonin - Baseline     Status: None   Collection Time: 12/01/21  6:07 AM  Result Value Ref Range   Procalcitonin 9.18 ng/mL    Comment:        Interpretation: PCT > 2 ng/mL: Systemic infection (sepsis) is likely, unless other causes are known. (NOTE)       Sepsis PCT Algorithm           Lower Respiratory Tract                                      Infection PCT Algorithm    ----------------------------     ----------------------------         PCT < 0.25 ng/mL                PCT < 0.10 ng/mL          Strongly encourage             Strongly discourage   discontinuation of antibiotics  initiation of antibiotics    ----------------------------     -----------------------------       PCT 0.25 - 0.50 ng/mL            PCT 0.10 - 0.25 ng/mL               OR       >80% decrease in PCT            Discourage initiation of                                            antibiotics      Encourage discontinuation           of antibiotics    ----------------------------     -----------------------------         PCT >= 0.50 ng/mL              PCT 0.26 - 0.50 ng/mL               AND       <80% decrease in PCT              Encourage initiation of                                             antibiotics       Encourage continuation           of antibiotics    ----------------------------     -----------------------------        PCT >= 0.50 ng/mL                  PCT > 0.50 ng/mL               AND         increase in PCT                  Strongly encourage                                      initiation of antibiotics     Strongly encourage escalation           of antibiotics                                     -----------------------------                                           PCT <= 0.25 ng/mL                                                 OR                                        >  80% decrease in PCT                                      Discontinue / Do not initiate                                             antibiotics  Performed at [facility], [address] [place], [place] [address]   Troponin I (High Sensitivity)     Status: Abnormal   Collection Time: 12/01/21  6:07 AM  Result Value Ref Range   Troponin I (High Sensitivity) 1,334 (HH) <18 ng/L    Comment: CRITICAL RESULT CALLED TO, READ BACK BY AND VERIFIED WITH BRIANNA CHAPMAN@0847  ON 12/01/21 BY HKP (NOTE) Elevated high sensitivity troponin I (hsTnI) values and significant  changes across serial measurements may suggest ACS but many other  chronic and acute conditions are known to elevate hsTnI results.  Refer to the "Links" section for chest pain algorithms and additional  guidance. Performed at [facility], [address] [place], [place] [address]   Blood gas, arterial     Status: Abnormal   Collection Time: 12/01/21  7:13 AM  Result Value Ref Range   FIO2 0.21    pH, Arterial 7.31 (L) 7.350 - 7.450   pCO2 arterial 43 32.0 - 48.0 mmHg   pO2, Arterial 77 (L) 83.0 - 108.0 mmHg   Bicarbonate 21.7 20.0 - 28.0 mmol/L   Acid-base deficit 4.5 (H) 0.0 - 2.0 mmol/L   O2 Saturation 93.9 %   Patient temperature 37.0    Collection site LEFT RADIAL    Sample type ARTERIAL DRAW    Allens test (pass/fail) PASS PASS    Comment: Performed at [facility], [address] [place], [place] [address]  Lactic acid, plasma     Status: Abnormal   Collection Time: 12/01/21  8:16 AM  Result Value Ref Range   Lactic Acid, Venous 3.2 (HH) 0.5 - 1.9 mmol/L    Comment: CRITICAL RESULT CALLED TO, READ BACK BY  AND VERIFIED WITH BRIANNA CHAPMAN@02 /01/23 BY HKP Performed at [facility], [address] [place],  [address]   Resp Panel by RT-PCR (Flu A&B, Covid) Nasopharyngeal Swab     Status: None   Collection Time: 12/01/21  8:16 AM   Specimen: Nasopharyngeal Swab; Nasopharyngeal(NP) swabs in vial transport medium  Result Value Ref Range   SARS Coronavirus 2 by RT PCR NEGATIVE NEGATIVE    Comment: (NOTE) SARS-CoV-2 target nucleic acids are NOT DETECTED.  The SARS-CoV-2 RNA is generally detectable in upper respiratory specimens during the acute phase of infection. The lowest concentration of SARS-CoV-2 viral copies this assay can detect is 138 copies/mL. A negative result does not preclude SARS-Cov-2 infection and should not be used as the sole basis for treatment or other patient management decisions. A negative result may occur with  improper specimen collection/handling, submission of specimen other than nasopharyngeal swab, presence of viral mutation(s) within the areas targeted by this assay, and inadequate number of viral copies(<138 copies/mL). A negative result must be combined with clinical observations, patient history, and epidemiological information. The expected result is Negative.  Fact Sheet for Patients:  EntrepreneurPulse.com.au  Fact Sheet for Healthcare Providers:  IncredibleEmployment.be  This test is no t yet  approved or cleared by the [place] FDA and  has been authorized for detection and/or diagnosis of SARS-CoV-2 by FDA under an Emergency Use Authorization (EUA). This EUA will remain  in effect (meaning this test can be used) for the duration of the COVID-19 declaration under Section 564(b)(1) of the Act, 21 U.S.C.section 360bbb-3(b)(1), unless the authorization is terminated  or revoked sooner.       Influenza A by PCR NEGATIVE NEGATIVE   Influenza B by PCR NEGATIVE NEGATIVE    Comment: (NOTE) The  Xpert Xpress SARS-CoV-2/FLU/RSV plus assay is intended as an aid in the diagnosis of influenza from Nasopharyngeal swab specimens and should not be used as a sole basis for treatment. Nasal washings and aspirates are unacceptable for Xpert Xpress SARS-CoV-2/FLU/RSV testing.  Fact Sheet for Patients: BloggerCourse.comhttps://www.fda.gov/media/152166/download  Fact Sheet for Healthcare Providers: SeriousBroker.ithttps://www.fda.gov/media/152162/download  This test is not yet approved or cleared by the [place] FDA and has been authorized for detection and/or diagnosis of SARS-CoV-2 by FDA under an Emergency Use Authorization (EUA). This EUA will remain in effect (meaning this test can be used) for the duration of the COVID-19 declaration under Section 564(b)(1) of the Act, 21 U.S.C. section 360bbb-3(b)(1), unless the authorization is terminated or revoked.  Performed at [facility], [address] [place], [place] [address]   Hepatitis panel, acute     Status: Abnormal   Collection Time: 12/01/21  8:16 AM  Result Value Ref Range   Hepatitis B Surface Ag NON REACTIVE NON REACTIVE   HCV Ab Reactive (A) NON REACTIVE    Comment: (NOTE) The CDC recommends that a Reactive HCV antibody result be followed up  with a HCV Nucleic Acid Amplification test.     Hep A IgM NON REACTIVE NON REACTIVE   Hep B C IgM NON REACTIVE NON REACTIVE    Comment: Performed at [facility], [address] [place], [place] [address]  HIV Antibody (routine testing w rflx)     Status: None   Collection Time: 12/01/21  8:46 AM  Result Value Ref Range   HIV Screen 4th Generation wRfx Non Reactive Non Reactive    Comment: Performed at [facility], [address] [place], [place] [address]  Protime-INR     Status: Abnormal   Collection Time: 12/01/21  8:46 AM  Result Value Ref Range   Prothrombin Time 15.8 (H) 11.4 - 15.2 seconds   INR 1.3 (H) 0.8 - 1.2    Comment: (NOTE) INR goal varies based on device and  disease states. Performed at [facility], [address] [place], [place] [address]   APTT     Status: None   Collection Time: 12/01/21  8:46 AM  Result Value Ref Range   aPTT 32 24 - 36 seconds    Comment: Performed at [facility], [address] [place], [place] [address]  Glucose, capillary     Status: Abnormal   Collection Time: 12/01/21  9:46 AM  Result Value Ref Range   Glucose-Capillary 123 (H) 70 - 99 mg/dL    Comment: Glucose reference range applies only to samples taken after fasting for at least 8 hours.  Urine Drug Screen, Qualitative ([facility] only)     Status: Abnormal   Collection Time: 12/01/21 10:00 AM  Result Value Ref Range   Tricyclic, Ur Screen NONE DETECTED NONE DETECTED   Amphetamines, Ur Screen NONE DETECTED NONE DETECTED   MDMA (Ecstasy)Ur Screen NONE DETECTED NONE DETECTED  Cocaine Metabolite,Ur [place] POSITIVE (A) NONE DETECTED   Opiate, Ur Screen NONE DETECTED NONE DETECTED   Phencyclidine (PCP) Ur S NONE DETECTED NONE DETECTED   Cannabinoid 50 Ng, Ur [place] NONE DETECTED NONE DETECTED   Barbiturates, Ur Screen NONE DETECTED NONE DETECTED   Benzodiazepine, Ur Scrn POSITIVE (A) NONE DETECTED   Methadone Scn, Ur NONE DETECTED NONE DETECTED    Comment: (NOTE) Tricyclics + metabolites, urine    Cutoff 1000 ng/mL Amphetamines + metabolites, urine  Cutoff 1000 ng/mL MDMA (Ecstasy), urine              Cutoff 500 ng/mL Cocaine Metabolite, urine          Cutoff 300 ng/mL Opiate + metabolites, urine        Cutoff 300 ng/mL Phencyclidine (PCP), urine         Cutoff 25 ng/mL Cannabinoid, urine                 Cutoff 50 ng/mL Barbiturates + metabolites, urine  Cutoff 200 ng/mL Benzodiazepine, urine              Cutoff 200 ng/mL Methadone, urine                   Cutoff 300 ng/mL  The urine drug screen provides only a preliminary, unconfirmed analytical test result and should not be used for non-medical purposes. Clinical consideration and  professional judgment should be applied to any positive drug screen result due to possible interfering substances. A more specific alternate chemical method must be used in order to obtain a confirmed analytical result. Gas chromatography / mass spectrometry (GC/MS) is the preferred confirm atory method. Performed at [facility], [address] [place], [place] [address]   MRSA Next Gen by PCR, Nasal     Status: None   Collection Time: 12/01/21 10:00 AM   Specimen: Urine, Catheterized; Nasal Swab  Result Value Ref Range   MRSA by PCR Next Gen NOT DETECTED NOT DETECTED    Comment: (NOTE) The GeneXpert MRSA Assay (FDA approved for NASAL specimens only), is one component of a comprehensive MRSA colonization surveillance program. It is not intended to diagnose MRSA infection nor to guide or monitor treatment for MRSA infections. Test performance is not FDA approved in patients less than 44 years old. Performed at [facility], [address] [place], [place] [address]   Lactic acid, plasma     Status: Abnormal   Collection Time: 12/01/21 10:14 AM  Result Value Ref Range   Lactic Acid, Venous 2.4 (HH) 0.5 - 1.9 mmol/L    Comment: CRITICAL VALUE NOTED. VALUE IS CONSISTENT WITH PREVIOUSLY REPORTED/CALLED VALUE/HKP Performed at [facility], [place], [place] [address]   Troponin I (High Sensitivity)     Status: Abnormal   Collection Time: 12/01/21 11:40 AM  Result Value Ref Range   Troponin I (High Sensitivity) 2,411 (HH) <18 ng/L    Comment: CRITICAL VALUE NOTED. VALUE IS CONSISTENT WITH PREVIOUSLY REPORTED/CALLED VALUE SCS (NOTE) Elevated high sensitivity troponin I (hsTnI) values and significant  changes across serial measurements may suggest ACS but many other  chronic and acute conditions are known to elevate hsTnI results.  Refer to the "Links" section for chest pain algorithms and additional  guidance. Performed at [facility] [address], [place], [place] [address]   Basic metabolic panel     Status: Abnormal   Collection Time: 12/01/21  2:32 PM  Result Value Ref Range   Sodium 131 (L) 135 - 145 mmol/L   Potassium 5.1 3.5 - 5.1 mmol/L   Chloride 101 98 - 111 mmol/L   CO2 22 22 - 32 mmol/L   Glucose, Bld 110 (H) 70 - 99 mg/dL    Comment: Glucose reference range applies only to samples taken after fasting for at least 8 hours.   BUN 53 (H) 6 - 20 mg/dL   Creatinine, Ser 1.02 (H) 0.61 - 1.24 mg/dL   Calcium 6.8 (L) 8.9 - 10.3 mg/dL   GFR, Estimated 22 (L) >60 mL/min    Comment: (NOTE) Calculated using the CKD-EPI Creatinine Equation (2021)    Anion gap 8 5 - 15    Comment: Performed at [facility], [address] [place], [place] [address]  Acetaminophen level (recheck)     Status: Abnormal   Collection Time: 12/01/21  2:32 PM  Result Value Ref Range   Acetaminophen (Tylenol), Serum <10 (L) 10 - 30 ug/mL    Comment: (NOTE) Therapeutic concentrations vary significantly. A range of 10-30 ug/mL  may be an effective concentration for many patients. However, some  are best treated at concentrations outside of this range. Acetaminophen concentrations >150 ug/mL at 4 hours after ingestion  and >50 ug/mL at 12 hours after ingestion are often associated with  toxic reactions.  Performed at [facility], [address] [place], [place] [address]   Troponin I (High Sensitivity)     Status: Abnormal   Collection Time: 12/01/21  2:32 PM  Result Value Ref Range   Troponin I (High Sensitivity) 2,444 (HH) <18 ng/L    Comment: CRITICAL VALUE NOTED. VALUE IS CONSISTENT WITH PREVIOUSLY REPORTED/CALLED VALUE SCS (NOTE) Elevated high sensitivity troponin I (hsTnI) values and significant  changes across serial measurements may suggest ACS but many other  chronic and acute conditions are known to elevate hsTnI results.  Refer to the "Links" section for chest pain algorithms and  additional  guidance. Performed at [facility], [address] [place], [place] [address]   Lactic acid, plasma     Status: Abnormal   Collection Time: 12/01/21  2:32 PM  Result Value Ref Range   Lactic Acid, Venous 2.4 (HH) 0.5 - 1.9 mmol/L    Comment: CRITICAL VALUE NOTED. VALUE IS CONSISTENT WITH PREVIOUSLY REPORTED/CALLED VALUE SCS Performed at [facility], [address] [place], [place] [address]     Current Facility-Administered Medications  Medication Dose Route Frequency Provider Last Rate Last Admin   0.9 %  sodium chloride infusion   Intravenous Continuous Harlon Ditty D, NP 150 mL/hr at 12/01/21 1500 Infusion Verify at 12/01/21 1500   Chlorhexidine Gluconate Cloth 2 % PADS 6 each  6 each Topical Daily Erin Fulling, MD   6 each at 12/01/21 1012   diazepam (VALIUM) injection 2.5 mg  2.5 mg Intravenous Q6H PRN Erin Fulling, MD   2.5 mg at 12/01/21 1208   docusate sodium (COLACE) capsule 100 mg  100 mg Oral BID PRN Judithe Modest, NP       fentaNYL (SUBLIMAZE) injection 25 mcg  25 mcg Intravenous Q2H PRN Harlon Ditty D, NP   25 mcg at 12/01/21 1333   heparin ADULT infusion 100 units/mL (25000 units/242mL)  1,000 Units/hr Intravenous Continuous Tressie Ellis, RPH 10 mL/hr at 12/01/21 1500 1,000 Units/hr at 12/01/21 1500   isosorbide mononitrate (IMDUR) 24 hr tablet 15 mg  15 mg Oral Daily Debbe Odea, MD  15 mg at 12/01/21 1440   pantoprazole (PROTONIX) injection 40 mg  40 mg Intravenous Q24H Darel Hong D, NP   40 mg at 12/01/21 [id number]   polyethylene glycol (MIRALAX / GLYCOLAX) packet 17 g  17 g Oral Daily PRN Bradly Bienenstock, NP       sodium zirconium cyclosilicate (LOKELMA) packet 10 g  10 g Oral TID Holley Raring, [place], MD   10 g at 12/01/21 1601    Musculoskeletal: Strength & Muscle Tone: decreased Gait & Station: unable to stand Patient leans: N/A            Psychiatric Specialty Exam:  Presentation  General  Appearance: No data recorded Eye Contact:No data recorded Speech:No data recorded Speech Volume:No data recorded Handedness:No data recorded  Mood and Affect  Mood:No data recorded Affect:No data recorded  Thought Process  Thought Processes:No data recorded Descriptions of Associations:No data recorded Orientation:No data recorded Thought Content:No data recorded History of Schizophrenia/Schizoaffective disorder:No data recorded Duration of Psychotic Symptoms:No data recorded Hallucinations:No data recorded Ideas of Reference:No data recorded Suicidal Thoughts:No data recorded Homicidal Thoughts:No data recorded  Sensorium  Memory:No data recorded Judgment:No data recorded Insight:No data recorded  Executive Functions  Concentration:No data recorded Attention Span:No data recorded Recall:No data recorded Fund of Knowledge:No data recorded Language:No data recorded  Psychomotor Activity  Psychomotor Activity:No data recorded  Assets  Assets:No data recorded  Sleep  Sleep:No data recorded  Physical Exam: Physical Exam Vitals and nursing note reviewed.  Constitutional:      Appearance: Normal appearance.  HENT:     Head: Normocephalic and atraumatic.     Mouth/Throat:     Pharynx: Oropharynx is clear.  Eyes:     Pupils: Pupils are equal, round, and reactive to light.  Cardiovascular:     Rate and Rhythm: Normal rate and regular rhythm.  Pulmonary:     Effort: Pulmonary effort is normal.     Breath sounds: Normal breath sounds.  Abdominal:     General: Abdomen is flat.     Palpations: Abdomen is soft.  Musculoskeletal:        General: Normal range of motion.  Skin:    General: Skin is warm and dry.  Neurological:     General: No focal deficit present.     Mental Status: He is alert. Mental status is at baseline.     Comments: Patient is reporting a dramatic hearing loss bilaterally.  Not completely deaf but unable to hear speech at normal volume.   Psychiatric:        Attention and Perception: Attention normal.        Mood and Affect: Mood normal.        Speech: Speech normal.        Behavior: Behavior normal.        Thought Content: Thought content normal.        Cognition and Memory: Cognition normal. He exhibits impaired remote memory.   Review of Systems  Constitutional: Negative.   HENT: Negative.    Eyes: Negative.   Respiratory: Negative.    Cardiovascular: Negative.   Gastrointestinal: Negative.   Musculoskeletal:  Positive for myalgias.  Skin: Negative.   Neurological: Negative.   Psychiatric/Behavioral:  Positive for memory loss and substance abuse. Negative for depression, hallucinations and suicidal ideas. The patient is nervous/anxious.   Blood pressure (!) 138/92, pulse 88, temperature 98.2 F (36.8 C), temperature source Oral, resp. rate 11, height 6' (1.829 m), weight 81.2 kg, SpO2 99 %.  Body mass index is 24.28 kg/m.  Treatment Plan Summary: Medication management and Plan consult was for altered mental status and encephalopathy.  Apparently he was unarousable earlier in the day.  By the time I came to see him the patient was awake as noted above and able to provide history and was not showing any signs of acute delirium.  Not psychotic.  Denying and minimizing any substance abuse.  His chief complaint is the pain and swelling in his legs and also the deafness.  Patient does not require specific psychiatric treatment at this point.  I asked him if he had been using any narcotics at all recently and explained that if he were having withdrawal I would like to give him an order for Suboxone for safety and comfort and cardiac protection.  Patient insisted repeatedly that he had not been using any narcotics at all recently.  I discussed this with the nursing staff.  Currently he is calm and cooperative.  I will provide an order for as needed medication if he should become agitated overnight.  Disposition: No evidence of  imminent risk to self or others at present.    Alethia Berthold, MD 12/01/2021 4:38 PM

## 2021-12-01 NOTE — ED Notes (Signed)
Pt in ct 

## 2021-12-01 NOTE — ED Provider Notes (Signed)
[facility] Provider Note    Event Date/Time   First MD Initiated Contact with Patient 12/01/21 [phone]     (approximate)   History   Altered Mental Status  Level 5 caveat:  history/ROS limited by acute/critical illness   HPI  Edwin [name] is a 36 y.o. male with no known history who presents by EMS for altered mental status.  No history or details were provided except that he "does lots of drugs" (information provided by his companions to EMS) and that they woke up and he was pulling out handfuls of his hair and kind of thrashing around and being agitated.  He was not responding to them but was obviously in some sort of distress.  When the patient arrives he is disheveled, foul-smelling having recently urinated on himself, and agitated.  He will focus on people in the room and give thumbs up and calm down briefly, but not speak.  After continued questioning he spoke to say "let me leave here" but he will not answer any questions or follow commands.  He is trying to pick off his leads and pull out his IV but otherwise is not responding to commands.     Physical Exam   Triage Vital Signs: ED Triage Vitals  Enc Vitals Group     BP 12/01/21 0552 (!) 127/110     Pulse Rate 12/01/21 0552 91     Resp 12/01/21 0552 12     Temp --      Temp src --      SpO2 12/01/21 0552 98 %     Weight 12/01/21 0549 87.4 kg (192 lb 10.9 oz)     Height --      Head Circumference --      Peak Flow --      Pain Score --      Pain Loc --      Pain Edu? --      Excl. in [place]? --     Most recent vital signs: Vitals:   12/01/21 0552 12/01/21 0556  BP: (!) 127/110   Pulse: 91   Resp: 12   SpO2: 98% 96%     General: Disheveled, disoriented, awake but agitated, alert but not following commands. CV:  Good peripheral perfusion.  Normal heart sounds. Resp:  Normal effort.  No accessory muscle usage.  Lungs are clear to auscultation. Abd:  No distention.  No appreciable  tenderness to palpation. Other:  Patient is agitated, occasionally will thrash around but then will calm down, focus on me, and give me a thumbs up.  However he is clearly altered and not following commands.  He is turning his head side to side and flexing extending his head without difficulty, exhibiting no signs of meningitis.  He grimaces occasionally as if in pain but he will not focus on any particular area or given indication of what is hurting.   ED Results / Procedures / Treatments   Labs (all labs ordered are listed, but only abnormal results are displayed) Labs Reviewed  CBC WITH DIFFERENTIAL/PLATELET - Abnormal; Notable for the following components:      Result Value   WBC 21.4 (*)    Neutro Abs 18.7 (*)    Monocytes Absolute 1.3 (*)    Abs Immature Granulocytes 0.20 (*)    All other components within normal limits  COMPREHENSIVE METABOLIC PANEL - Abnormal; Notable for the following components:   Sodium 133 (*)    Potassium 6.6 (*)  Chloride 96 (*)    Glucose, Bld 210 (*)    BUN 46 (*)    Creatinine, Ser 3.28 (*)    Calcium 7.3 (*)    Total Protein 8.6 (*)    AST 1,510 (*)    ALT 725 (*)    GFR, Estimated 24 (*)    All other components within normal limits  SALICYLATE LEVEL - Abnormal; Notable for the following components:   Salicylate Lvl [id number] (*)    All other components within normal limits  MAGNESIUM - Abnormal; Notable for the following components:   Magnesium 2.6 (*)    All other components within normal limits  BLOOD GAS, ARTERIAL - Abnormal; Notable for the following components:   pH, Arterial 7.31 (*)    pO2, Arterial 77 (*)    Acid-base deficit 4.5 (*)    All other components within normal limits  OSMOLALITY - Abnormal; Notable for the following components:   Osmolality 307 (*)    All other components within normal limits  RESP PANEL BY RT-PCR (FLU A&B, COVID) ARPGX2  ETHANOL  AMMONIA  LIPASE, BLOOD  ACETAMINOPHEN LEVEL  URINE DRUG SCREEN,  QUALITATIVE ([facility] ONLY)  CK  LACTIC ACID, PLASMA  LACTIC ACID, PLASMA  PROCALCITONIN  URINALYSIS, COMPLETE (UACMP) WITH MICROSCOPIC  BASIC METABOLIC PANEL  ACETAMINOPHEN LEVEL  HEPATITIS PANEL, ACUTE  HIV ANTIBODY (ROUTINE TESTING W REFLEX)  PROTIME-INR  APTT  TROPONIN I (HIGH SENSITIVITY)     EKG  ED ECG REPORT I, Hinda Kehr, the attending physician, personally viewed and interpreted this ECG.  Date: 12/01/2021 EKG Time: 5:49 AM Rate: 90 Rhythm: normal sinus rhythm QRS Axis: normal Intervals: Prolonged QTc of 517 ms ST/T Wave abnormalities: Non-specific ST segment / T-wave changes, but no clear evidence of acute ischemia. Narrative Interpretation: no definitive evidence of acute ischemia; does not meet STEMI criteria.    RADIOLOGY Official CT head result is pending at the time of admission to Dr. Mortimer Fries of the ICU, but I personally reviewed the images and I see no acute abnormality even though the ventricles seem to be a little bit smaller than usual.  However there is no intracranial bleeding.    PROCEDURES:  Critical Care performed: Yes, see critical care procedure note(s)  .1-3 Lead EKG Interpretation Performed by: Hinda Kehr, MD Authorized by: Hinda Kehr, MD     Interpretation: normal     ECG rate:  90   ECG rate assessment: normal     Rhythm: sinus rhythm     Ectopy: none     Conduction: normal   .Critical Care Performed by: Hinda Kehr, MD Authorized by: Hinda Kehr, MD   Critical care provider statement:    Critical care time (minutes):  75   Critical care time was exclusive of:  Separately billable procedures and treating other patients   Critical care was necessary to treat or prevent imminent or life-threatening deterioration of the following conditions:  CNS failure or compromise, metabolic crisis, hepatic failure, renal failure and toxidrome   Critical care was time spent personally by me on the following activities:  Development of  treatment plan with patient or surrogate, evaluation of patient's response to treatment, examination of patient, obtaining history from patient or surrogate, ordering and performing treatments and interventions, ordering and review of laboratory studies, ordering and review of radiographic studies, pulse oximetry, re-evaluation of patient's condition and review of old charts   MEDICATIONS ORDERED IN ED: Medications  sodium chloride 0.9 % bolus 1,000 mL (has  no administration in time range)  calcium gluconate inj 10% (1 g) URGENT USE ONLY! (has no administration in time range)  albuterol (PROVENTIL) (2.5 MG/3ML) 0.083% nebulizer solution 10 mg (has no administration in time range)  sodium bicarbonate injection 50 mEq (has no administration in time range)  sodium zirconium cyclosilicate (LOKELMA) packet 10 g (has no administration in time range)  sodium chloride 0.9 % bolus 1,000 mL (has no administration in time range)  midazolam (VERSED) injection 1 mg (1 mg Intravenous Given 12/01/21 [id number])     IMPRESSION / MDM / ASSESSMENT AND PLAN / ED COURSE  I reviewed the triage vital signs and the nursing notes.                              Differential diagnosis includes, but is not limited to, polysubstance abuse, metabolic encephalopathy, acute infection including meningitis, metabolic or electrolyte abnormality, renal failure, head injury including intracranial hemorrhage.  The patient is on the cardiac monitor to evaluate for evidence of arrhythmia and/or significant heart rate changes.  Patient has not been violent but is quite agitated and not following instructions.  In order to get any kind of work-up on him we will need to provide at least some as a medication.  I personally reviewed his EKG and is notable for QTc prolongation at greater than 500 ms.  I will avoid droperidol and haloperidol but we will proceed with Versed 1 mg IV which I can titrate up if necessary in order to act as a calming  agent and help keep him safe.  Labs ordered include CBC with differential, CMP, lipase, ammonia level, ethanol level, salicylate level, acetaminophen level, urine drug screen.  In spite of the patient's encephalopathy.  He is protecting his airway.  He is showing no signs of meningismus and I think that an LP would be dangerous and unhelpful at this point compared to the rest of the evaluation.  When he is calm enough to do so we will obtain a noncontrast head CT as well.  Clinical Course as of 12/01/21 0830  Wed Dec 01, 2021  0641 WBC(!): 21.4 The patient has a leukocytosis of 21.4 which is of unclear significance and a relatively young person who is agitated and has apparently been using multiple drugs. [CF]  0705 Patient's comprehensive metabolic panel is emergent.  He has a potassium of 6.6 which would explain his prolonged QTc previously mentioned on his EKG.  He has acute renal failure with creatinine of 3.28 and a BUN of 46.  Glucose is 210.  He also has evidence of liver failure with an AST of 1510 and ALT is 725 with a normal total bilirubin.  His anion gap is within normal limits.  Ethanol level is negative, ammonia level is within normal limits, lipase is within normal limits.  I went back to reassess the patient who is a little bit less agitated now after the Versed.  He will open his eyes and focus on me, occasionally speak words like "let me leave" and "fuck", but he will not answer questions or follow commands.  However he continues to turn his head side to side and flex and extend his head.  I continue to believe it is much more likely that the patient is suffering from metabolic encephalopathy in addition to polysubstance abuse/toxidrome than a meningitis.  The risk of performing an LP on this patient is greater than the benefit at  this time. [CF]  [id number] I consulted nephrology by phone and spoke with Dr. Holley Raring.  We discussed the case in detail.  He agreed with my plans to try to  aggressively (but temporarily) treat the hyperkalemia.  I ordered sodium bicarb 1 ampoule IV, 2 L normal saline IV bolus, albuterol 10 mg, sodium gluconate 1 g IV.  Holding off on insulin and glucose for now.  I sent the information about the patient to Dr. Holley Raring through secure chat text and put in a consult order. [CF]  [id number] Paging Dr. Mortimer Fries with the ICU. [CF]  0803 (delayed documentation) I consulted Dr. Mortimer Fries (ICU) by phone and we discussed the case in detail.  I expressed my concern that even though he is hemodynamically stable, he has CNS compromise and EKG changes in the setting of acute renal failure, acute liver failure, polysubstance abuse, metabolic encephalopathy, and multiple severe electrolyte abnormalities including hyperkalemia.  Dr. Tressia Miners said that the PCCM team would care for the patient.  I updated Dr. Jacqualine Code in the emergency department with the situation and plan of care, but I anticipate the ICU team will take over at this point.  Serum osmolality is very slightly elevated but based on calculations, it is just barely above the upper limit of normal and likely not suggestive of a toxic alcohol.  Patient was previously too agitated for a CT scan, but they were able to take him to the scanner around the time I spoke with Dr. Mortimer Fries.  He continues to protect his airway. [CF]    Clinical Course User Index [CF] Hinda Kehr, MD     FINAL CLINICAL IMPRESSION(S) / ED DIAGNOSES   Final diagnoses:  Acute metabolic encephalopathy  Acute renal failure, unspecified acute renal failure type ([place])  Hyperkalemia  Hypermagnesemia  Acute hepatitis     Rx / DC Orders   ED Discharge Orders     None        Note:  This document was prepared using Dragon voice recognition software and may include unintentional dictation errors.   Hinda Kehr, MD 12/01/21 [phone]

## 2021-12-01 NOTE — ED Triage Notes (Signed)
Pt was at friends house staying overnight. Pt's friends woke up, noticed pt was still awake, confused and pulling his hair out. Pt also c/o right leg pain.

## 2021-12-01 NOTE — Consult Note (Addendum)
PHARMACY CONSULT NOTE  Pharmacy Consult for Electrolyte Monitoring and Replacement   Recent Labs: Potassium (mmol/L)  Date Value  12/01/2021 6.6 (HH)   Magnesium (mg/dL)  Date Value  56/38/7564 2.6 (H)   Calcium (mg/dL)  Date Value  33/29/5188 7.3 (L)   Albumin (g/dL)  Date Value  41/66/0630 3.9   Sodium (mmol/L)  Date Value  12/01/2021 133 (L)   Assessment: 36 year old male with past medical history of polysubstance abuse who presented with acute metabolic encephalopathy possibly due to drug overdose UDS + for benzodiazepine, cocaine. Pharmacy consulted for electrolyte monitoring and replacement.   Goal of Therapy:  Electrolytes within normal limits  Plan:  --Hyperkalemia likely due to rhabdomyolysis and AKI; Lokelma PO 10 g packet TID --Mild hyponatremia, will continue to monitor, nephrology following --Calcium IV 1 g x 1 --Follow up with AM labs tomorrow  Elvia Collum 12/01/2021 1:00 PM

## 2021-12-01 NOTE — ED Notes (Signed)
Dr Belia Heman notified of lactic 3.2. orders to be placed as needed.

## 2021-12-01 NOTE — ED Notes (Signed)
Attempted to call report to ICU. Was informed to call back in a few mins.

## 2021-12-01 NOTE — Progress Notes (Signed)
*  PRELIMINARY RESULTS* Echocardiogram 2D Echocardiogram has been performed.  Cristela Blue 12/01/2021, 2:27 PM

## 2021-12-01 NOTE — Consult Note (Addendum)
ANTICOAGULATION CONSULT NOTE - Follow Up Consult  Pharmacy Consult for heparin Indication: ACS Troponin [id number], cardiology consulted, awaiting recommendations  No Known Allergies  Patient Measurements: Height: 6' (182.9 cm) Weight: 81.2 kg (179 lb 0.2 oz) IBW/kg (Calculated) : 77.6 Heparin Dosing Weight: 81.2 kg  Labs: Recent Labs    12/01/21 0607 12/01/21 0846  HGB 15.9  --   HCT 47.3  --   PLT 329  --   APTT  --  32  LABPROT  --  15.8*  INR  --  1.3*  CREATININE 3.28*  --   CKTOTAL >50,000*  --   TROPONINIHS 1,334*  --     Estimated Creatinine Clearance: 34.2 mL/min (A) (by C-G formula based on SCr of 3.28 mg/dL (H)).  Assessment: 36 year old male with past medical history of polysubstance abuse who presented with acute metabolic encephalopathy possibly due to drug overdose UDS + for benzodiazepine, cocaine. Pharmacy has been consulted for heparin monitoring and dosing.   Goal of Therapy:  Heparin level 0.3-0.7 units/ml Monitor platelets by anticoagulation protocol: Yes  Baseline Labs: INR 1.3 aPTT 32   Plan:  --Start heparin infusion @ 1000 u/hr --Check anti-Xa level in 6 hours and daily once consecutively therapeutic while on heparin --Continue to monitor H&H and platelets per protocol  Elvia Collum 12/01/2021,11:23 AM

## 2021-12-01 NOTE — Progress Notes (Signed)
Pharmacy Antibiotic Note  Edwin [name] is a 36 y.o. male admitted on 12/01/2021 with altered mental status. Found to have sepsis due to necrotizing fascitis. Pharmacy has been consulted for vancomycin and piperacillin/tazobactam dosing.  WBC 21.4 and LA elevated, VSS stable. IN AKI with rhabdomyolysis with Scr of 3.53. Unclear baseline renal function.  Plan: Vancomycin 2 grams loading dose Hold off on further vancomycin maintenance doses due to concurrent AKI. Follow up Scr in the AM to determine timing of future doses.  Piperacillin/tazobactam 3.375 grams every 8 hours (4 hour infusion)  Also on clindamycin 600 mg every 8 hours  Height: 6' (182.9 cm) Weight: 81.2 kg (179 lb 0.2 oz) IBW/kg (Calculated) : 77.6  Temp (24hrs), Avg:98.3 F (36.8 C), Min:97.9 F (36.6 C), Max:98.5 F (36.9 C)  Recent Labs  Lab 12/01/21 0607 12/01/21 0816 12/01/21 1014 12/01/21 1432  WBC 21.4*  --   --   --   CREATININE 3.28*  --   --  3.53*  LATICACIDVEN  --  3.2* 2.4* 2.4*    Estimated Creatinine Clearance: 31.8 mL/min (A) (by C-G formula based on SCr of 3.53 mg/dL (H)).    No Known Allergies  Antimicrobials this admission: 12/01/21 vancomycin >>  12/01/21 cefepime  >>  12/01/21 clindamycin >>  Dose adjustments this admission: None  Microbiology results: 2/1 BCx: in process 2/1 UCx: in process  Thank you for allowing pharmacy to be a part of this patients care.  Wynelle Cleveland, PharmD Pharmacy Resident  12/01/2021 8:24 PM

## 2021-12-01 NOTE — Consult Note (Signed)
ANTICOAGULATION CONSULT NOTE - Follow Up Consult  Pharmacy Consult for heparin Indication: ACS Troponin [id number], cardiology consulted, awaiting recommendations  No Known Allergies  Patient Measurements: Height: 6' (182.9 cm) Weight: 81.2 kg (179 lb 0.2 oz) IBW/kg (Calculated) : 77.6 Heparin Dosing Weight: 81.2 kg  Labs: Recent Labs    12/01/21 0607 12/01/21 0846 12/01/21 1140 12/01/21 1432 12/01/21 1756  HGB 15.9  --   --   --   --   HCT 47.3  --   --   --   --   PLT 329  --   --   --   --   APTT  --  32  --   --   --   LABPROT  --  15.8*  --   --   --   INR  --  1.3*  --   --   --   HEPARINUNFRC  --   --   --   --  <0.10*  CREATININE 3.28*  --   --  3.53*  --   CKTOTAL >50,000*  --   --   --   --   TROPONINIHS 1,334*  --  2,411* 2,444* 1,794*     Estimated Creatinine Clearance: 31.8 mL/min (A) (by C-G formula based on SCr of 3.53 mg/dL (H)).  Assessment: 36 year old male with past medical history of polysubstance abuse who presented with acute metabolic encephalopathy possibly due to drug overdose UDS + for benzodiazepine, cocaine. Pharmacy has been consulted for heparin monitoring and dosing.   2/1 @1756  HL= <0.10   subthera,bolus, inc drip  Goal of Therapy:  Heparin level 0.3-0.7 units/ml Monitor platelets by anticoagulation protocol: Yes  Baseline Labs: INR 1.3 aPTT 32   Plan2/1 @1756  HL= <0.10:   subtherapeutic Bolus 2400 units, increase drip rat eto 1250 units/hr -check HL in 6 hrs afte rate change --Continue to monitor H&H and platelets per protocol  Achille Xiang A 12/01/2021,8:07 PM

## 2021-12-01 NOTE — Consult Note (Addendum)
[facility] CONSULT NOTE    Date: 12/01/2021                  Patient Name:  Edwin [name]  MRN: [id number]  DOB: [birth year]  Age / Sex: 36 y.o., male         PCP: Pcp, No                 Service Requesting Consult: Hospitalist                 Reason for Consult: Acute kidney injury, Hyperkalemia            History of Present Illness: Patient is a 36 y.o. male with no known past medical history, who was admitted to [facility] on 12/01/2021 for evaluation of altered mental status.  Possible drug overdose suspected but exact agent unknown at this time.  Patient quite confused at the time of interview.  He could not provide any details regarding his present illness.  He was apparently at home and pulling out handfuls of hair and was agitated.  When asked as to whether he took any kind of toxic substance patient does not answer.  Upon presentation he was found to have multiple metabolic derangements.  His creatinine was noted to be elevated at 3.28 with a potassium of 6.6.  Serum bicarbonate was 22.  He also has liver enzyme elevations with AST of 1510 and ALT of 725.  Troponin is also elevated at 1334.  Serum osmolality also mildly elevated at 307.  Lactic acid level also elevated at 3.2.  Acetaminophen and salicylate levels were found to be normal.  Blood alcohol level was also negative.   Medications: Outpatient medications: No medications prior to admission.    Current medications: Current Facility-Administered Medications  Medication Dose Route Frequency Provider Last Rate Last Admin   0.9 %  sodium chloride infusion   Intravenous Continuous Bradly Bienenstock, NP       Chlorhexidine Gluconate Cloth 2 % PADS 6 each  6 each Topical Daily Kasa, Kurian, MD       docusate sodium (COLACE) capsule 100 mg  100 mg Oral BID PRN Darel Hong D, NP       heparin injection 5,000 Units  5,000 Units Subcutaneous Q8H Darel Hong D, NP   5,000 Units at 12/01/21 0845    pantoprazole (PROTONIX) injection 40 mg  40 mg Intravenous Q24H Darel Hong D, NP   40 mg at 12/01/21 0843   polyethylene glycol (MIRALAX / GLYCOLAX) packet 17 g  17 g Oral Daily PRN Bradly Bienenstock, NP       sodium zirconium cyclosilicate (LOKELMA) packet 10 g  10 g Oral STAT Hinda Kehr, MD          Allergies: No Known Allergies    Past Medical History: No past medical history on file.   Past Surgical History: No past surgical history known at this time   Family History: No family history on file.   Social History: Social History   Socioeconomic History   Marital status: Single    Spouse name: Not on file   Number of children: Not on file   Years of education: Not on file   Highest education level: Not on file  Occupational History   Not on file  Tobacco Use   Smoking status: Not on file   Smokeless tobacco: Not on file  Substance and Sexual Activity   Alcohol use: Not on file  Drug use: Not on file   Sexual activity: Not on file  Other Topics Concern   Not on file  Social History Narrative   Not on file   Social Determinants of Health   Financial Resource Strain: Not on file  Food Insecurity: Not on file  Transportation Needs: Not on file  Physical Activity: Not on file  Stress: Not on file  Social Connections: Not on file  Intimate Partner Violence: Not on file     Review of Systems: Patient unable to provide review of systems given altered mental status.  Vital Signs: Blood pressure 127/80, pulse 88, temperature 98.4 F (36.9 C), temperature source Oral, resp. rate 14, height 6' (1.829 m), weight 81.2 kg, SpO2 96 %.  Weight trends: Filed Weights   12/01/21 0549 12/01/21 0951  Weight: 87.4 kg 81.2 kg     Physical Exam: General: Critically ill-appearing  Head: Normocephalic, atraumatic. Moist oral mucosal membranes  Eyes: Anicteric  Neck: Supple  Lungs:  Clear to auscultation, normal effort  Heart: S1S2 no rubs  Abdomen:  Soft,  nontender, bowel sounds present  Extremities: No peripheral edema.  Neurologic: Arousable but confused.  Skin: No acute rash  Access: No hemodialysis access    Lab results: Basic Metabolic Panel: Recent Labs  Lab 12/01/21 0607  NA 133*  K 6.6*  CL 96*  CO2 22  GLUCOSE 210*  BUN 46*  CREATININE 3.28*  CALCIUM 7.3*  MG 2.6*    Liver Function Tests: Recent Labs  Lab 12/01/21 0607  AST 1,510*  ALT 725*  ALKPHOS 105  BILITOT 0.3  PROT 8.6*  ALBUMIN 3.9   Recent Labs  Lab 12/01/21 0607  LIPASE 41   Recent Labs  Lab 12/01/21 0607  AMMONIA 21    CBC: Recent Labs  Lab 12/01/21 0607  WBC 21.4*  NEUTROABS 18.7*  HGB 15.9  HCT 47.3  MCV 92.2  PLT 329    Cardiac Enzymes: No results for input(s): CKTOTAL, CKMB, CKMBINDEX, TROPONINI in the last 168 hours.  BNP: Invalid input(s): POCBNP  CBG: Recent Labs  Lab 12/01/21 0946  GLUCAP 123*    Microbiology: Results for orders placed or performed during the hospital encounter of 12/01/21  Resp Panel by RT-PCR (Flu A&B, Covid) Nasopharyngeal Swab     Status: None   Collection Time: 12/01/21  8:16 AM   Specimen: Nasopharyngeal Swab; Nasopharyngeal(NP) swabs in vial transport medium  Result Value Ref Range Status   SARS Coronavirus 2 by RT PCR NEGATIVE NEGATIVE Final    Comment: (NOTE) SARS-CoV-2 target nucleic acids are NOT DETECTED.  The SARS-CoV-2 RNA is generally detectable in upper respiratory specimens during the acute phase of infection. The lowest concentration of SARS-CoV-2 viral copies this assay can detect is 138 copies/mL. A negative result does not preclude SARS-Cov-2 infection and should not be used as the sole basis for treatment or other patient management decisions. A negative result may occur with  improper specimen collection/handling, submission of specimen other than nasopharyngeal swab, presence of viral mutation(s) within the areas targeted by this assay, and inadequate number of  viral copies(<138 copies/mL). A negative result must be combined with clinical observations, patient history, and epidemiological information. The expected result is Negative.  Fact Sheet for Patients:  EntrepreneurPulse.com.au  Fact Sheet for Healthcare Providers:  IncredibleEmployment.be  This test is no t yet approved or cleared by the [place] FDA and  has been authorized for detection and/or diagnosis of SARS-CoV-2 by FDA under an Emergency  Use Authorization (EUA). This EUA will remain  in effect (meaning this test can be used) for the duration of the COVID-19 declaration under Section 564(b)(1) of the Act, 21 U.S.C.section 360bbb-3(b)(1), unless the authorization is terminated  or revoked sooner.       Influenza A by PCR NEGATIVE NEGATIVE Final   Influenza B by PCR NEGATIVE NEGATIVE Final    Comment: (NOTE) The Xpert Xpress SARS-CoV-2/FLU/RSV plus assay is intended as an aid in the diagnosis of influenza from Nasopharyngeal swab specimens and should not be used as a sole basis for treatment. Nasal washings and aspirates are unacceptable for Xpert Xpress SARS-CoV-2/FLU/RSV testing.  Fact Sheet for Patients: EntrepreneurPulse.com.au  Fact Sheet for Healthcare Providers: IncredibleEmployment.be  This test is not yet approved or cleared by the [place] FDA and has been authorized for detection and/or diagnosis of SARS-CoV-2 by FDA under an Emergency Use Authorization (EUA). This EUA will remain in effect (meaning this test can be used) for the duration of the COVID-19 declaration under Section 564(b)(1) of the Act, 21 U.S.C. section 360bbb-3(b)(1), unless the authorization is terminated or revoked.  Performed at [facility], [address] [place], [place] [address]     Coagulation Studies: Recent Labs    12/01/21 0846  LABPROT 15.8*  INR 1.3*    Urinalysis: No  results for input(s): COLORURINE, LABSPEC, PHURINE, GLUCOSEU, HGBUR, BILIRUBINUR, KETONESUR, PROTEINUR, UROBILINOGEN, NITRITE, LEUKOCYTESUR in the last 72 hours.  Invalid input(s): APPERANCEUR    Imaging: CT HEAD WO CONTRAST (5MM)  Result Date: 12/01/2021 CLINICAL DATA:  Altered mental status EXAM: CT HEAD WITHOUT CONTRAST TECHNIQUE: Contiguous axial images were obtained from the base of the skull through the vertex without intravenous contrast. RADIATION DOSE REDUCTION: This exam was performed according to the departmental dose-optimization program which includes automated exposure control, adjustment of the mA and/or kV according to patient size and/or use of iterative reconstruction technique. COMPARISON:  CT head 12/07/2018 FINDINGS: Brain: There is no evidence of acute intracranial hemorrhage, extra-axial fluid collection, or acute infarct. Parenchymal volume is normal. The ventricles are normal in size. Gray-white differentiation is preserved. There is no mass lesion.  There is no midline shift. Vascular: No hyperdense vessel or unexpected calcification. Skull: No calvarial fracture is seen. There is an age-indeterminate fracture of the anterior left maxillary sinus wall. Sinuses/Orbits: There is moderate mucosal thickening in the bilateral maxillary sinuses. Globes and orbits are unremarkable. Other: None. IMPRESSION: 1. No acute intracranial pathology. 2. Age-indeterminate fracture of the anterior left maxillary sinus wall. Electronically Signed   By: Valetta Mole M.D.   On: 12/01/2021 08:11   DG Chest Port 1 View  Result Date: 12/01/2021 CLINICAL DATA:  A 36 year old male presents for evaluation of altered mental status. EXAM: PORTABLE CHEST 1 VIEW COMPARISON:  Comparison made with December of 2021. FINDINGS: EKG leads project over the upper chest. Trachea midline. Cardiomediastinal contours and hilar structures with suggestion of cardiac enlargement and with central pulmonary vascular  engorgement. No lobar consolidation. No sign of pleural effusion. No visible pneumothorax. On limited assessment there is no acute skeletal process. IMPRESSION: Cardiomegaly with central pulmonary vascular engorgement. No consolidation or effusion. Electronically Signed   By: Zetta Bills M.D.   On: 12/01/2021 08:39   US Abdomen Limited RUQ (LIVER/GB)  Result Date: 12/01/2021 CLINICAL DATA:  Elevated LFTs EXAM: ULTRASOUND ABDOMEN LIMITED RIGHT UPPER QUADRANT COMPARISON:  None. FINDINGS: Gallbladder: No gallstones or wall thickening visualized. No sonographic Murphy sign noted by sonographer. Common bile duct:  Diameter: 2 mm Liver: No focal lesion identified. Within normal limits in parenchymal echogenicity. Portal vein is patent on color Doppler imaging with normal direction of blood flow towards the liver. Other: None. IMPRESSION: Normal right upper quadrant ultrasound. Electronically Signed   By: Maurine Simmering M.D.   On: 12/01/2021 09:14     Assessment & Plan: Pt is a 36 y.o. male with no known past medical history, who was admitted to [facility] on 12/01/2021 for evaluation of altered mental status.  Possible drug overdose suspected but exact agent unknown at this time.    1.  Acute kidney injury, exact etiology currently unknown. 2.  Hyperkalemia serum potassium 6.6. 3.  Mild hyponatremia. 4.  Elevated liver enzymes.  Plan: Patient suspected of having had some type of drug overdose however unclear at the moment.  We are asked to see him for acute kidney injury and hyperkalemia.  We advised administration of Lokelma however earlier this a.m. patient was unable to take the medication due to altered mental status.  He does appear to be slightly more awake and alert and we will attempt to see if he can take [facility] now.  Patient has Foley catheter in place.  No immediate need for dialysis at the moment.  We plan to check CK, volatile acid screen.  He does appear to have some lactic acidosis however serum  bicarbonate currently separable at 22.  Further plan based upon patient progress.

## 2021-12-01 NOTE — ED Notes (Signed)
Dr Jacqualine Code notified trop 1334. Orders to be placed as needed.

## 2021-12-01 NOTE — ED Provider Notes (Signed)
° °  Serum osms 307, calculated 294. Within norms/expected.   ----------------------------------------- 8:09 AM on 12/01/2021 ----------------------------------------- Personally viewed the patient's CT scan of the head.  I do not see any evidence of gross hemorrhage or severe pathology.  However await radiologist complete read  The patient is currently resting calmly.  Hemodynamics reassuring  Temp 97.9   The patient is currently plan for admission to the ICU with Dr. Belia Heman   EKG is reviewed inter by me at 845 Heart rate 90 QRS 100 QTc 450 Normal sinus rhythm, mild non-specific TWA. Can not eclude ischemia at this time.  RN notified me Troponin elevated at ~ 1000.    Sharyn Creamer, MD 12/01/21 [phone]

## 2021-12-01 NOTE — Consult Note (Addendum)
Cardiology Consultation:   Patient ID: Shahin Polczynski MRN: [id number]; DOB: 29-Sep-1986  Admit date: 12/01/2021 Date of Consult: 12/01/2021  PCP:  Merryl Hacker, No   [facility] Providers Cardiologist:  None   [place] rounding     Patient Profile:   Quint Maga is a 36 y.o. male with a hx of polysubstance use who is being seen 12/01/2021 for the evaluation of elevated troponins at the request of Dr. Mortimer Fries.  History of Present Illness:   Mr. Bala is a 35 year old male with polysubstance abuse who presents due to agitation.  History taking significantly limited by condition of patient, EMR review and staff useful history.  Apparently patient came in today with his companions who were concerned as patient became very agitated.  They stated patient used several illicit drugs.  Denies any prior medical conditions and takes no prescribed medications.  Initial work-up in the ED revealed several metabolic derangements including abnormal ALT and AST, potassium elevated at 6.6, creatinine 3.12, EKG showed sinus rhythm with early repolarization abnormality, initial troponins 1334.  Patient was given normal saline, sodium gluconate, bicarbonate.  Urine drug screen returned positive for cocaine and benzodiazepines.   No past medical history on file.  The histories are not reviewed yet. Please review them in the "History" navigator section and refresh this Ashley.   Home Medications:  Prior to Admission medications   Not on File    Inpatient Medications: Scheduled Meds:  Chlorhexidine Gluconate Cloth  6 each Topical Daily   isosorbide mononitrate  15 mg Oral Daily   pantoprazole (PROTONIX) IV  40 mg Intravenous Q24H   sodium zirconium cyclosilicate  10 g Oral TID   Continuous Infusions:  sodium chloride 150 mL/hr at 12/01/21 1148   heparin 1,000 Units/hr (12/01/21 1139)   PRN Meds: diazepam, docusate sodium, fentaNYL (SUBLIMAZE) injection, polyethylene glycol  Allergies:   No  Known Allergies  Social History:   Social History   Socioeconomic History   Marital status: Single    Spouse name: Not on file   Number of children: Not on file   Years of education: Not on file   Highest education level: Not on file  Occupational History   Not on file  Tobacco Use   Smoking status: Not on file   Smokeless tobacco: Not on file  Substance and Sexual Activity   Alcohol use: Not on file   Drug use: Not on file   Sexual activity: Not on file  Other Topics Concern   Not on file  Social History Narrative   Not on file   Social Determinants of Health   Financial Resource Strain: Not on file  Food Insecurity: Not on file  Transportation Needs: Not on file  Physical Activity: Not on file  Stress: Not on file  Social Connections: Not on file  Intimate Partner Violence: Not on file    Family History:   No family history on file.   ROS:  Please see the history of present illness.   All other ROS reviewed and negative.     Physical Exam/Data:   Vitals:   12/01/21 0930 12/01/21 0951 12/01/21 1012 12/01/21 1200  BP:  127/80 (!) 146/94 (!) 141/94  Pulse: 93 88 91 86  Resp: 15 14 12 16   Temp:  98.4 F (36.9 C)    TempSrc:  Oral    SpO2: 96% 96% 96% 98%  Weight:  81.2 kg    Height:  6' (1.829 m)      Intake/Output  Summary (Last 24 hours) at 12/01/2021 1403 Last data filed at 12/01/2021 1300 Gross per 24 hour  Intake 2007.85 ml  Output 300 ml  Net 1707.85 ml   Last 3 Weights 12/01/2021 12/01/2021  Weight (lbs) 179 lb 0.2 oz 192 lb 10.9 oz  Weight (kg) 81.2 kg 87.4 kg     Body mass index is 24.28 kg/m.  General: Appears somnolent, HEENT: normal Neck: no JVD Vascular: No carotid bruits; Distal pulses 2+ bilaterally Cardiac:  normal S1, S2; RRR; no murmur  Lungs:  clear to auscultation bilaterally, no wheezing, rhonchi or rales  Abd: soft, nontender, no hepatomegaly  Ext: no edema Musculoskeletal:  No deformities, BUE and BLE strength normal and  equal Skin: warm and dry  Neuro: Somnolent Psych: Unable to assess  EKG:  The EKG was personally reviewed and demonstrates: Sinus rhythm Telemetry:  Telemetry was personally reviewed and demonstrates: Sinus rhythm  Relevant CV Studies: Echocardiogram pending  Laboratory Data:  High Sensitivity Troponin:   Recent Labs  Lab 12/01/21 0607 12/01/21 1140  TROPONINIHS 1,334* 2,411*     Chemistry Recent Labs  Lab 12/01/21 0607  NA 133*  K 6.6*  CL 96*  CO2 22  GLUCOSE 210*  BUN 46*  CREATININE 3.28*  CALCIUM 7.3*  MG 2.6*  GFRNONAA 24*  ANIONGAP 15    Recent Labs  Lab 12/01/21 0607  PROT 8.6*  ALBUMIN 3.9  AST 1,510*  ALT 725*  ALKPHOS 105  BILITOT 0.3   Lipids No results for input(s): CHOL, TRIG, HDL, LABVLDL, LDLCALC, CHOLHDL in the last 168 hours.  Hematology Recent Labs  Lab 12/01/21 0607  WBC 21.4*  RBC 5.13  HGB 15.9  HCT 47.3  MCV 92.2  MCH 31.0  MCHC 33.6  RDW 12.4  PLT 329   Thyroid No results for input(s): TSH, FREET4 in the last 168 hours.  BNPNo results for input(s): BNP, PROBNP in the last 168 hours.  DDimer No results for input(s): DDIMER in the last 168 hours.   Radiology/Studies:  CT HEAD WO CONTRAST (5MM)  Result Date: 12/01/2021 CLINICAL DATA:  Altered mental status EXAM: CT HEAD WITHOUT CONTRAST TECHNIQUE: Contiguous axial images were obtained from the base of the skull through the vertex without intravenous contrast. RADIATION DOSE REDUCTION: This exam was performed according to the departmental dose-optimization program which includes automated exposure control, adjustment of the mA and/or kV according to patient size and/or use of iterative reconstruction technique. COMPARISON:  CT head 12/07/2018 FINDINGS: Brain: There is no evidence of acute intracranial hemorrhage, extra-axial fluid collection, or acute infarct. Parenchymal volume is normal. The ventricles are normal in size. Gray-white differentiation is preserved. There is no  mass lesion.  There is no midline shift. Vascular: No hyperdense vessel or unexpected calcification. Skull: No calvarial fracture is seen. There is an age-indeterminate fracture of the anterior left maxillary sinus wall. Sinuses/Orbits: There is moderate mucosal thickening in the bilateral maxillary sinuses. Globes and orbits are unremarkable. Other: None. IMPRESSION: 1. No acute intracranial pathology. 2. Age-indeterminate fracture of the anterior left maxillary sinus wall. Electronically Signed   By: Valetta Mole M.D.   On: 12/01/2021 08:11   DG Chest Port 1 View  Result Date: 12/01/2021 CLINICAL DATA:  A 36 year old male presents for evaluation of altered mental status. EXAM: PORTABLE CHEST 1 VIEW COMPARISON:  Comparison made with December of 2021. FINDINGS: EKG leads project over the upper chest. Trachea midline. Cardiomediastinal contours and hilar structures with suggestion of cardiac enlargement and with  central pulmonary vascular engorgement. No lobar consolidation. No sign of pleural effusion. No visible pneumothorax. On limited assessment there is no acute skeletal process. IMPRESSION: Cardiomegaly with central pulmonary vascular engorgement. No consolidation or effusion. Electronically Signed   By: Zetta Bills M.D.   On: 12/01/2021 08:39   US Abdomen Limited RUQ (LIVER/GB)  Result Date: 12/01/2021 CLINICAL DATA:  Elevated LFTs EXAM: ULTRASOUND ABDOMEN LIMITED RIGHT UPPER QUADRANT COMPARISON:  None. FINDINGS: Gallbladder: No gallstones or wall thickening visualized. No sonographic Murphy sign noted by sonographer. Common bile duct: Diameter: 2 mm Liver: No focal lesion identified. Within normal limits in parenchymal echogenicity. Portal vein is patent on color Doppler imaging with normal direction of blood flow towards the liver. Other: None. IMPRESSION: Normal right upper quadrant ultrasound. Electronically Signed   By: Maurine Simmering M.D.   On: 12/01/2021 09:14     Assessment and Plan:    NSTEMI -Positive cocaine use,?  Vasospasm -Continue to trend troponins until peaked.  Initial troponin is 1334 -Start Imdur 15 mg daily to help with possible vasospasm. -Patient very somnolent, cannot ascertain presence of chest pain. -Obtain echocardiogram -Platelets normal, INR 1.3. -Heparin x48 hours -No plans for invasive work-up/left heart cath unless echo is significantly abnormal and creatinine normalizes.  2.  Abnormal LFTs, acute renal failure, elevated CK -?  Rhabdomyolysis -On IV fluids -Management as per ICU team  3.  Polysubstance abuse -UDS positive for cocaine and benzos -Cessation recommended  Total encounter time more than 110 minutes  Greater than 50% was spent in counseling and coordination of care with the patient   Signed, Kate Sable, MD  12/01/2021 2:03 PM

## 2021-12-01 NOTE — Consult Note (Signed)
°  Attempted consult on this patient, admitted to ICU after using several illicit drugs and becoming agitated.  His nurse states he has not said anything about leaving and has not attempted. He did call her "nurse" and asked for pain medications.  Patient is sleeping at this time. No indication for IVC at this time.

## 2021-12-02 ENCOUNTER — Inpatient Hospital Stay: Payer: Commercial Managed Care - HMO

## 2021-12-02 DIAGNOSIS — M609 Myositis, unspecified: Secondary | ICD-10-CM | POA: Diagnosis not present

## 2021-12-02 DIAGNOSIS — N170 Acute kidney failure with tubular necrosis: Secondary | ICD-10-CM

## 2021-12-02 DIAGNOSIS — E875 Hyperkalemia: Secondary | ICD-10-CM

## 2021-12-02 DIAGNOSIS — G9341 Metabolic encephalopathy: Secondary | ICD-10-CM

## 2021-12-02 DIAGNOSIS — F1410 Cocaine abuse, uncomplicated: Secondary | ICD-10-CM | POA: Diagnosis not present

## 2021-12-02 DIAGNOSIS — M6282 Rhabdomyolysis: Secondary | ICD-10-CM

## 2021-12-02 DIAGNOSIS — T405X1A Poisoning by cocaine, accidental (unintentional), initial encounter: Secondary | ICD-10-CM

## 2021-12-02 LAB — SYNOVIAL CELL COUNT + DIFF, W/ CRYSTALS
Crystals, Fluid: NONE SEEN
Crystals, Fluid: NONE SEEN
Eosinophils-Synovial: 0 % (ref 0–1)
Eosinophils-Synovial: 0 %
Lymphocytes-Synovial Fld: 0 % (ref 0–20)
Lymphocytes-Synovial Fld: 7 %
Monocyte-Macrophage-Synovial Fluid: 5 % — ABNORMAL LOW (ref 50–90)
Monocyte-Macrophage-Synovial Fluid: 0 %
Neutrophil, Synovial: 95 % — ABNORMAL HIGH (ref 0–25)
Neutrophil, Synovial: 93 %
WBC, Synovial: 1667 /mm3 — ABNORMAL HIGH (ref 0–200)
WBC, Synovial: 96 /mm3 (ref 0–200)

## 2021-12-02 LAB — COMPREHENSIVE METABOLIC PANEL
ALT: 634 U/L — ABNORMAL HIGH (ref 0–44)
AST: 711 U/L — ABNORMAL HIGH (ref 15–41)
Albumin: 2.5 g/dL — ABNORMAL LOW (ref 3.5–5.0)
Alkaline Phosphatase: 80 U/L (ref 38–126)
Anion gap: 12 (ref 5–15)
BUN: 65 mg/dL — ABNORMAL HIGH (ref 6–20)
CO2: 19 mmol/L — ABNORMAL LOW (ref 22–32)
Calcium: 6.9 mg/dL — ABNORMAL LOW (ref 8.9–10.3)
Chloride: 101 mmol/L (ref 98–111)
Creatinine, Ser: 4.76 mg/dL — ABNORMAL HIGH (ref 0.61–1.24)
GFR, Estimated: 15 mL/min — ABNORMAL LOW (ref >60)
Glucose, Bld: 111 mg/dL — ABNORMAL HIGH (ref 70–99)
Potassium: 4.3 mmol/L (ref 3.5–5.1)
Sodium: 132 mmol/L — ABNORMAL LOW (ref 135–145)
Total Bilirubin: 0.6 mg/dL (ref 0.3–1.2)
Total Protein: 5.7 g/dL — ABNORMAL LOW (ref 6.5–8.1)

## 2021-12-02 LAB — VOLATILES,BLD-ACETONE,ETHANOL,ISOPROP,METHANOL
Acetone, blood: <0.01 g/dL (ref 0.000–0.010)
Ethanol, blood: <0.01 g/dL (ref 0.000–0.010)
Isopropanol, blood: <0.01 g/dL (ref 0.000–0.010)
Methanol, blood: <0.01 g/dL (ref 0.000–0.010)

## 2021-12-02 LAB — CBC
HCT: 43.9 % (ref 39.0–52.0)
Hemoglobin: 15 g/dL (ref 13.0–17.0)
MCH: 30.9 pg (ref 26.0–34.0)
MCHC: 34.2 g/dL (ref 30.0–36.0)
MCV: 90.3 fL (ref 80.0–100.0)
Platelets: 237 10*3/uL (ref 150–400)
RBC: 4.86 MIL/uL (ref 4.22–5.81)
RDW: 12.6 % (ref 11.5–15.5)
WBC: 18.3 10*3/uL — ABNORMAL HIGH (ref 4.0–10.5)
nRBC: 0 % (ref 0.0–0.2)

## 2021-12-02 LAB — CK: Total CK: 19295 U/L — ABNORMAL HIGH (ref 49–397)

## 2021-12-02 LAB — HEPARIN LEVEL (UNFRACTIONATED)
Heparin Unfractionated: <0.1 IU/mL — ABNORMAL LOW (ref 0.30–0.70)
Heparin Unfractionated: 0.11 IU/mL — ABNORMAL LOW (ref 0.30–0.70)

## 2021-12-02 LAB — LACTIC ACID, PLASMA: Lactic Acid, Venous: 1.6 mmol/L (ref 0.5–1.9)

## 2021-12-02 LAB — VANCOMYCIN, RANDOM: Vancomycin Rm: 23

## 2021-12-02 LAB — PROCALCITONIN: Procalcitonin: 9.21 ng/mL

## 2021-12-02 MED ORDER — DEXMEDETOMIDINE HCL IN NACL 400 MCG/100ML IV SOLN
0.4000 ug/kg/h | INTRAVENOUS | Status: DC
  Administered 2021-12-02: 0.4 ug/kg/h via INTRAVENOUS
  Filled 2021-12-02 (×2): qty 100

## 2021-12-02 MED ORDER — GADOBUTROL 1 MMOL/ML IV SOLN
8.0000 mL | Freq: Once | INTRAVENOUS | Status: AC | PRN
  Administered 2021-12-02: 8 mL via INTRAVENOUS

## 2021-12-02 MED ORDER — LIDOCAINE-EPINEPHRINE 1 %-1:100000 IJ SOLN
10.0000 mL | Freq: Once | INTRAMUSCULAR | Status: AC
  Administered 2021-12-02: 10 mL via INTRADERMAL
  Filled 2021-12-02 (×2): qty 10

## 2021-12-02 MED ORDER — IOHEXOL 300 MG/ML  SOLN
50.0000 mL | Freq: Once | INTRAMUSCULAR | Status: AC | PRN
  Administered 2021-12-02: 3 mL via INTRA_ARTICULAR

## 2021-12-02 MED ORDER — LIDOCAINE HCL (PF) 1 % IJ SOLN
10.0000 mL | Freq: Once | INTRAMUSCULAR | Status: AC
  Administered 2021-12-02: 5 mL
  Filled 2021-12-02: qty 10

## 2021-12-02 NOTE — Consult Note (Signed)
NAME: Edwin [name]  DOB: July 15, 1986  MRN: [id number]  Date/Time: 12/02/2021 11:24 PM  REQUESTING PROVIDER: Darel Hong Subjective:  REASON FOR CONSULT: Necrotizing fasciitis versus myositis No history available from patient.  Chart reviewed. Edwin [name] is a 36 y.o. male presented via EMS with altered mental status on 12/01/2021.Marland Kitchen  As per EMS patient does a lot of drugs and when his companion woke up they found him pulling out handful of his head and was agitated and thrashing around.  He was confused and was not responding to them.  Was also complaining of severe pain.  In the ER he was confused and was not able to hold a conversation. Vitals 127/110, temperature 98.3, l heart rate 91 pulse ox 98% WBC 21.4, Hb 15.9, platelet 329 creatinine was 1.03.  CK was more than 50,000, potassium 6.6, glucose 210, calcium 7.3, CO2 22, AST 1510, ALT 725, bilirubin 0.3 CT of the head showed no acute intracranial pathology but age-indeterminate fracture of the anterior left maxillary sinus wall. CT of the left femur showed extensive myositis involving the muscles about the left hip and most severely the quadriceps muscle with accompanying fasciitis which is more prominent proximally.  This was a limited evaluation without contrast.  There was small hip joint and knee joint effusion and septic joint could not be excluded.  Blood culture sent and patient was started on vancomycin, Zosyn and clindamycin.  He was admitted to ICU. Urine drug screen was positive for benzo and cocaine. I am asked to see the patient for management of the myositis/necrotizing fasciitis. Social History   Socioeconomic History   Marital status: Single    Spouse name: Not on file   Number of children: Not on file   Years of education: Not on file   Highest education level: Not on file  Occupational History   Not on file  Tobacco Use   Smoking status: Not on file   Smokeless tobacco: Not on file  Substance and Sexual Activity    Alcohol use: Not on file   Drug use: Not on file   Sexual activity: Not on file  Other Topics Concern   Not on file  Social History Narrative   Not on file   Social Determinants of Health   Financial Resource Strain: Not on file  Food Insecurity: Not on file  Transportation Needs: Not on file  Physical Activity: Not on file  Stress: Not on file  Social Connections: Not on file  Intimate Partner Violence: Not on file    No family history on file. No Known Allergies I? Current Facility-Administered Medications  Medication Dose Route Frequency Provider Last Rate Last Admin   0.9 %  sodium chloride infusion   Intravenous Continuous Lateef, [place], MD 175 mL/hr at 12/02/21 1700 Infusion Verify at 12/02/21 1700   Chlorhexidine Gluconate Cloth 2 % PADS 6 each  6 each Topical Daily Flora Lipps, MD   6 each at 12/02/21 0823   clindamycin (CLEOCIN) IVPB 600 mg  600 mg Intravenous Q8H Lang Snow, NP 100 mL/hr at 12/02/21 2056 600 mg at 12/02/21 2056   dexmedetomidine (PRECEDEX) 400 MCG/100ML (4 mcg/mL) infusion  0.4-1.2 mcg/kg/hr Intravenous Titrated Darel Hong D, NP   Stopped at 12/02/21 1900   diazepam (VALIUM) injection 2.5 mg  2.5 mg Intravenous Q6H PRN Flora Lipps, MD   2.5 mg at 12/02/21 1845   docusate sodium (COLACE) capsule 100 mg  100 mg Oral BID PRN Bradly Bienenstock, NP  fentaNYL (SUBLIMAZE) injection 25 mcg  25 mcg Intravenous Q2H PRN Bradly Bienenstock, NP   25 mcg at 12/02/21 1748   haloperidol lactate (HALDOL) injection 2 mg  2 mg Intravenous Q6H PRN Clapacs, Madie Reno, MD       heparin ADULT infusion 100 units/mL (25000 units/290mL)  1,850 Units/hr Intravenous Continuous Dallie Piles, RPH 18.5 mL/hr at 12/02/21 2055 1,850 Units/hr at 12/02/21 2055   isosorbide mononitrate (IMDUR) 24 hr tablet 15 mg  15 mg Oral Daily Kate Sable, MD   15 mg at 12/02/21 [id number]   pantoprazole (PROTONIX) injection 40 mg  40 mg Intravenous Q24H Darel Hong D, NP    40 mg at 12/02/21 0749   piperacillin-tazobactam (ZOSYN) IVPB 3.375 g  3.375 g Intravenous Q8H Lang Snow, NP 12.5 mL/hr at 12/02/21 2147 3.375 g at 12/02/21 2147   polyethylene glycol (MIRALAX / GLYCOLAX) packet 17 g  17 g Oral Daily PRN Bradly Bienenstock, NP       vancomycin variable dose per unstable renal function (pharmacist dosing)   Does not apply See admin instructions Wynelle Cleveland, RPH         Abtx:  Anti-infectives (From admission, onward)    Start     Dose/Rate Route Frequency Ordered Stop   12/01/21 2200  clindamycin (CLEOCIN) IVPB 600 mg        600 mg 100 mL/hr over 30 Minutes Intravenous Every 8 hours 12/01/21 2010     12/01/21 2130  piperacillin-tazobactam (ZOSYN) IVPB 3.375 g        3.375 g 12.5 mL/hr over 240 Minutes Intravenous Every 8 hours 12/01/21 2041     12/01/21 2115  vancomycin (VANCOREADY) IVPB 2000 mg/400 mL        2,000 mg 200 mL/hr over 120 Minutes Intravenous  Once 12/01/21 2022 12/02/21 0059   12/01/21 2042  vancomycin variable dose per unstable renal function (pharmacist dosing)         Does not apply See admin instructions 12/01/21 2042     12/01/21 2030  ceFEPIme (MAXIPIME) 2 g in sodium chloride 0.9 % 100 mL IVPB  Status:  Discontinued        2 g 200 mL/hr over 30 Minutes Intravenous Every 12 hours 12/01/21 2022 12/01/21 2030       REVIEW OF SYSTEMS:  Not available: Objective:  VITALS:  BP 98/69    Pulse 72    Temp 97.6 F (36.4 C)    Resp 15    Ht 6' (1.829 m)    Wt 85.9 kg    SpO2 100%    BMI 25.68 kg/m  PHYSICAL EXAM:  General: Somnolent.  On persistent calling him he opens his eyes and mumbles.  No other verbal response Head: Normocephalic, without obvious abnormality, atraumatic. Eyes: Conjunctivae clear, anicteric sclerae. Pupils are equal ENT cannot be examined Neck:, symmetrical, no adenopathy, thyroid: non tender no carotid bruit and no JVD. Back: No CVA tenderness. Lungs: Bilateral air entry Heart: Regular  rate and rhythm, no murmur, rub or gallop. Abdomen: Soft, non-tender,not distended. Bowel sounds normal. No masses Extremities: Over the deltoid area there is a big tattoo.  There is a faint erythematous area Cannot left thigh swollen, indurated, erythematous area on the lateral aspect, severe tenderness, no crepitus,         skin: Some areas of excoriation. Lymph: Cervical, supraclavicular normal. Neurologic: Cannot be evaluated   pertinent Labs Lab Results CBC    Component Value Date/Time  WBC 18.3 (H) 12/02/2021 0855   RBC 4.86 12/02/2021 0855   HGB 15.0 12/02/2021 0855   HCT 43.9 12/02/2021 0855   PLT 237 12/02/2021 0855   MCV 90.3 12/02/2021 0855   MCH 30.9 12/02/2021 0855   MCHC 34.2 12/02/2021 0855   RDW 12.6 12/02/2021 0855   LYMPHSABS 1.1 12/01/2021 0607   MONOABS 1.3 (H) 12/01/2021 0607   EOSABS 0.0 12/01/2021 0607   BASOSABS 0.1 12/01/2021 0607    CMP Latest Ref Rng & Units 12/02/2021 12/01/2021 12/01/2021  Glucose 70 - 99 mg/dL 111(H) 97 110(H)  BUN 6 - 20 mg/dL 65(H) 59(H) 53(H)  Creatinine 0.61 - 1.24 mg/dL 4.76(H) 4.03(H) 3.53(H)  Sodium 135 - 145 mmol/L 132(L) 131(L) 131(L)  Potassium 3.5 - 5.1 mmol/L 4.3 4.6 5.1  Chloride 98 - 111 mmol/L 101 101 101  CO2 22 - 32 mmol/L 19(L) 19(L) 22  Calcium 8.9 - 10.3 mg/dL 6.9(L) 6.7(L) 6.8(L)  Total Protein 6.5 - 8.1 g/dL 5.7(L) - -  Total Bilirubin 0.3 - 1.2 mg/dL 0.6 - -  Alkaline Phos 38 - 126 U/L 80 - -  AST 15 - 41 U/L 711(H) - -  ALT 0 - 44 U/L 634(H) - -      Microbiology: Recent Results (from the past 240 hour(s))  Resp Panel by RT-PCR (Flu A&B, Covid) Nasopharyngeal Swab     Status: None   Collection Time: 12/01/21  8:16 AM   Specimen: Nasopharyngeal Swab; Nasopharyngeal(NP) swabs in vial transport medium  Result Value Ref Range Status   SARS Coronavirus 2 by RT PCR NEGATIVE NEGATIVE Final    Comment: (NOTE) SARS-CoV-2 target nucleic acids are NOT DETECTED.  The SARS-CoV-2 RNA is generally  detectable in upper respiratory specimens during the acute phase of infection. The lowest concentration of SARS-CoV-2 viral copies this assay can detect is 138 copies/mL. A negative result does not preclude SARS-Cov-2 infection and should not be used as the sole basis for treatment or other patient management decisions. A negative result may occur with  improper specimen collection/handling, submission of specimen other than nasopharyngeal swab, presence of viral mutation(s) within the areas targeted by this assay, and inadequate number of viral copies(<138 copies/mL). A negative result must be combined with clinical observations, patient history, and epidemiological information. The expected result is Negative.  Fact Sheet for Patients:  EntrepreneurPulse.com.au  Fact Sheet for Healthcare Providers:  IncredibleEmployment.be  This test is no t yet approved or cleared by the [place] FDA and  has been authorized for detection and/or diagnosis of SARS-CoV-2 by FDA under an Emergency Use Authorization (EUA). This EUA will remain  in effect (meaning this test can be used) for the duration of the COVID-19 declaration under Section 564(b)(1) of the Act, 21 U.S.C.section 360bbb-3(b)(1), unless the authorization is terminated  or revoked sooner.       Influenza A by PCR NEGATIVE NEGATIVE Final   Influenza B by PCR NEGATIVE NEGATIVE Final    Comment: (NOTE) The Xpert Xpress SARS-CoV-2/FLU/RSV plus assay is intended as an aid in the diagnosis of influenza from Nasopharyngeal swab specimens and should not be used as a sole basis for treatment. Nasal washings and aspirates are unacceptable for Xpert Xpress SARS-CoV-2/FLU/RSV testing.  Fact Sheet for Patients: EntrepreneurPulse.com.au  Fact Sheet for Healthcare Providers: IncredibleEmployment.be  This test is not yet approved or cleared by the [place] FDA  and has been authorized for detection and/or diagnosis of SARS-CoV-2 by FDA under an Emergency Use Authorization (EUA). This  EUA will remain in effect (meaning this test can be used) for the duration of the COVID-19 declaration under Section 564(b)(1) of the Act, 21 U.S.C. section 360bbb-3(b)(1), unless the authorization is terminated or revoked.  Performed at [facility], [address] [place], [place] [address]   MRSA Next Gen by PCR, Nasal     Status: None   Collection Time: 12/01/21 10:00 AM   Specimen: Urine, Catheterized; Nasal Swab  Result Value Ref Range Status   MRSA by PCR Next Gen NOT DETECTED NOT DETECTED Final    Comment: (NOTE) The GeneXpert MRSA Assay (FDA approved for NASAL specimens only), is one component of a comprehensive MRSA colonization surveillance program. It is not intended to diagnose MRSA infection nor to guide or monitor treatment for MRSA infections. Test performance is not FDA approved in patients less than 33 years old. Performed at [facility], [address] [place], [place] [address]     IMAGING RESULTS:  I have personally reviewed the films ?extensive myositis involving the muscles about the left hip and most severely the quadriceps muscle with accompanying fasciitis which is more prominent proximally   Impression/Recommendation ? Acute encephalopathy secondary to cocaine and benzo use with metabolic derangement including acute kidney injury  Severe rhabdomyolysis related to cocaine and benzo use .  Getting IV fluids. Need to rule out infectious myositis Currently patient is on vancomycin ,Zosyn and clindamycin because of concern for necrotizing fasciitis Seen by surgeon and the latter has been ruled out Blood cultures remain negative tomorrow we could discontinue vancomycin.  Polysubstance use  Hyperkalemia likely due to the rhabdomyolysis  AKI likely due to rhabdomyolysis  Transaminitis secondary to the  rhabdomyolysis Hepatitis C antibody reactive.  We will check viral load HIV nonreactive ___________________________________________________ Discussed with care team. Note:  This document was prepared using Dragon voice recognition software and may include unintentional dictation errors.

## 2021-12-02 NOTE — Progress Notes (Signed)
Pharmacy Antibiotic Note  Edwin [name] is a 36 y.o. male admitted on 12/01/2021 with altered mental status. Found to have sepsis due to necrotizing fascitis. Pharmacy has been consulted for vancomycin and piperacillin/tazobactam dosing.  WBC 21.4 and LA elevated, VSS stable. IN AKI with rhabdomyolysis with Scr of 3.53. Unclear baseline renal function.  Plan: Vancomycin 2 grams loading dose given 2/1 at 2259 Hold off on further vancomycin maintenance doses due to concurrent AKI. Follow up Scr in the AM to determine timing of future doses. Vancomycin random level today at 2000  Piperacillin/tazobactam 3.375 grams every 8 hours (4 hour infusion)  Also on clindamycin 600 mg every 8 hours  Height: 6' (182.9 cm) Weight: 85.9 kg (189 lb 6 oz) IBW/kg (Calculated) : 77.6  Temp (24hrs), Avg:98.1 F (36.7 C), Min:97.6 F (36.4 C), Max:98.5 F (36.9 C)  Recent Labs  Lab 12/01/21 0607 12/01/21 0816 12/01/21 1014 12/01/21 1432 12/01/21 2147 12/02/21 0006 12/02/21 0855  WBC 21.4*  --   --   --   --   --  18.3*  CREATININE 3.28*  --   --  3.53* 4.03*  --  4.76*  LATICACIDVEN  --  3.2* 2.4* 2.4* 2.2* 1.6  --      Estimated Creatinine Clearance: 23.5 mL/min (A) (by C-G formula based on SCr of 4.76 mg/dL (H)).    No Known Allergies  Antimicrobials this admission: 12/01/21 vancomycin >>  12/01/21 cefepime  >>  12/01/21 clindamycin >>  Dose adjustments this admission: None  Microbiology results: 2/1 BCx: pending 2/1 UCx: pending 2/1 MRSA: (-) 2/2 lt hip synovial Cx: pending  Thank you for allowing pharmacy to be a part of this patients care.  Elvia Collum 12/02/2021 2:13 PM

## 2021-12-02 NOTE — Consult Note (Signed)
ANTICOAGULATION CONSULT NOTE   Pharmacy Consult for heparin Indication: ACS Troponin [phone], cardiology recommended heparin x48 h, no current plans for invasive work-up  No Known Allergies  Patient Measurements: Height: 6' (182.9 cm) Weight: 85.9 kg (189 lb 6 oz) IBW/kg (Calculated) : 77.6 Heparin Dosing Weight: 81.2 kg  Labs: Recent Labs    12/01/21 0607 12/01/21 0846 12/01/21 1140 12/01/21 1432 12/01/21 1756 12/01/21 2147 12/02/21 0855  HGB 15.9  --   --   --   --   --  15.0  HCT 47.3  --   --   --   --   --  43.9  PLT 329  --   --   --   --   --  237  APTT  --  32  --   --   --   --   --   LABPROT  --  15.8*  --   --   --   --   --   INR  --  1.3*  --   --   --   --   --   HEPARINUNFRC  --   --   --   --  <0.10*  --  <0.10*  CREATININE 3.28*  --   --  3.53*  --  4.03* 4.76*  CKTOTAL >50,000*  --   --   --   --   --  19,295*  TROPONINIHS 1,334*  --  2,411* 2,444* 1,794*  --   --      Estimated Creatinine Clearance: 23.5 mL/min (A) (by C-G formula based on SCr of 4.76 mg/dL (H)).  Assessment: 36 year old male with past medical history of polysubstance abuse who presented with acute metabolic encephalopathy possibly due to drug overdose UDS + for benzodiazepine, cocaine. Pharmacy has been consulted for heparin monitoring and dosing. H&H, platelets wnl   Goal of Therapy:  Heparin level 0.3-0.7 units/ml Monitor platelets by anticoagulation protocol: Yes   Plan:  Heparin level remains subtherapeutic despite rate increase: increase heparin rate to 1850 u/hr  recheck anti-Xa level in 8 hours and daily once consecutively therapeutic while on heparin 48 h heparin stop date per cardiology Continue to monitor H&H and platelets per protocol  Lowella Bandy 12/02/2021,2:40 PM

## 2021-12-02 NOTE — Progress Notes (Signed)
Patient's left knee aspirate has a cell count of 96 which is within normal limits.  There is no evidence of a septic left knee joint.

## 2021-12-02 NOTE — Progress Notes (Signed)
Reported from night shift that pt refused labs early this AM. Re-educated pt about the importance of labs in managing his care to which pt then agreed. Lab informed of pt consent for draw this AM.

## 2021-12-02 NOTE — Progress Notes (Signed)
Precedex discontinued post MRI. PRN Valium given during MRI scan to help manage pt anxiety and restlessness during procedure. VS stable upon return to ICU, drowsy but arousable with voice.

## 2021-12-02 NOTE — Consult Note (Signed)
ORTHOPAEDIC CONSULTATION  REQUESTING PHYSICIAN: Flora Lipps, MD  Chief Complaint: Left hip and knee pain  HPI: Edwin [name] is a 36 y.o. male who was admitted with altered mental status on 12/01/2021.  Patient has a history of polysubstance abuse.  He is admitted for acute metabolic encephalopathy in the setting of multiple severe metabolic derangements including AKI with hyperkalemia, rhabdomyolysis and liver failure also talk screen positive for cocaine.  Patient has an elevated troponin.  Orthopedics is consulted for question of septic left hip and knee.  A CT scan of the left femur was performed yesterday due to thigh swelling.  The findings were suggestive of extensive myositis involving the quadriceps and hip muscles.  There is no evidence of muscular or fascial gas.  Patient has smaller focal hypodensity in the mid biceps femoris muscle which the radiologist felt may be a smaller area of edema/inflammation.  There were small hip and knee joint effusions.  The radiologist report states septic joints could not be excluded.An MRI with and without contrast has been ordered of the left thigh for further evaluation.  The patient had a left hip aspiration by interventional radiology today.  Gram stain is not available yet.  Patient had no crystals in the sample and a cell count of 1667.    Patient had a male family member at the bedside when I saw the patient this afternoon to aspirate his left knee.  He states he noticed the patient limping last Thursday.  He is unaware of any specific injury.  Patient is unable to provide an accurate history.  It is unclear if the patient has been down for prolonged period of time befor being brought to the hospital.  No past medical history on file.  Social History   Socioeconomic History   Marital status: Single    Spouse name: Not on file   Number of children: Not on file   Years of education: Not on file   Highest education level: Not on file   Occupational History   Not on file  Tobacco Use   Smoking status: Not on file   Smokeless tobacco: Not on file  Substance and Sexual Activity   Alcohol use: Not on file   Drug use: Not on file   Sexual activity: Not on file  Other Topics Concern   Not on file  Social History Narrative   Not on file   Social Determinants of Health   Financial Resource Strain: Not on file  Food Insecurity: Not on file  Transportation Needs: Not on file  Physical Activity: Not on file  Stress: Not on file  Social Connections: Not on file   No family history on file. No Known Allergies Prior to Admission medications   Not on File   CT HEAD WO CONTRAST (5MM)  Result Date: 12/01/2021 CLINICAL DATA:  Altered mental status EXAM: CT HEAD WITHOUT CONTRAST TECHNIQUE: Contiguous axial images were obtained from the base of the skull through the vertex without intravenous contrast. RADIATION DOSE REDUCTION: This exam was performed according to the departmental dose-optimization program which includes automated exposure control, adjustment of the mA and/or kV according to patient size and/or use of iterative reconstruction technique. COMPARISON:  CT head 12/07/2018 FINDINGS: Brain: There is no evidence of acute intracranial hemorrhage, extra-axial fluid collection, or acute infarct. Parenchymal volume is normal. The ventricles are normal in size. Gray-white differentiation is preserved. There is no mass lesion.  There is no midline shift. Vascular: No hyperdense vessel or  unexpected calcification. Skull: No calvarial fracture is seen. There is an age-indeterminate fracture of the anterior left maxillary sinus wall. Sinuses/Orbits: There is moderate mucosal thickening in the bilateral maxillary sinuses. Globes and orbits are unremarkable. Other: None. IMPRESSION: 1. No acute intracranial pathology. 2. Age-indeterminate fracture of the anterior left maxillary sinus wall. Electronically Signed   By: Valetta Mole M.D.    On: 12/01/2021 08:11   CT FEMUR LEFT WO CONTRAST  Addendum Date: 12/01/2021   ADDENDUM REPORT: 12/01/2021 19:43 ADDENDUM: Discussed with PA Ouma over the telephone at 7:42 p.m. on 12/01/2021 with read back. Electronically Signed   By: Ofilia Neas M.D.   On: 12/01/2021 19:43   Result Date: 12/01/2021 CLINICAL DATA:  Cellulitis, swelling EXAM: CT OF THE LOWER LEFT EXTREMITY WITHOUT CONTRAST TECHNIQUE: Multidetector CT imaging of the lower left extremity was performed according to the standard protocol. RADIATION DOSE REDUCTION: This exam was performed according to the departmental dose-optimization program which includes automated exposure control, adjustment of the mA and/or kV according to patient size and/or use of iterative reconstruction technique. COMPARISON:  None. FINDINGS: Bones/Joint/Cartilage No acute fracture identified. No suspicious bony lesions. No bone destruction or periosteal reaction. Appears to be a small knee joint effusion and hip joint effusion. Ligaments Suboptimally assessed by CT. Muscles and Tendons Diffuse marked muscular edema and swelling visualized throughout the entire quadriceps muscle more prominent laterally, with accompanying perimuscular and intramuscular septal/fascial fluid/edema which is more prominent proximally. There is a smaller approximally 3.2 x 2.4 cm focal mild hypodense area identified in the mid biceps femoris muscle which may also represent edema/inflammation. Similar muscular edema and adjacent fat stranding of the muscles surrounding the left hip. No foci of air identified within the muscle or fascial planes. Soft tissues Mild subcutaneous fat stranding edema in the lateral aspect of the thigh. IMPRESSION: 1. Findings suggestive of extensive myositis involving the muscles about the left hip and most severely the quadriceps muscles, with accompanying fasciitis which is more prominent proximally. Limited evaluation without contrast. No foci of muscular or  fascial gas identified, however necrotizing fasciitis can not be excluded as gas is not always seen. Clinical correlation recommended. 2. Smaller more focal hypodensity in the mid biceps femoris muscle which may also represent a smaller area of edema/inflammation. 3. Small hip joint and knee joint effusions. Septic joints can not be excluded. Will contact and notify the ordering provider. Electronically Signed: By: Ofilia Neas M.D. On: 12/01/2021 19:30   US Venous Img Lower Bilateral (DVT)  Result Date: 12/02/2021 CLINICAL DATA:  Bilateral lower extremity pain and edema. 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 Evaluate for acute or chronic DVT. EXAM: BILATERAL LOWER EXTREMITY VENOUS DOPPLER ULTRASOUND TECHNIQUE: Gray-scale sonography with graded compression, as well as color Doppler and duplex ultrasound were performed to evaluate the lower extremity deep venous systems from the level of the common femoral vein and including the common femoral, femoral, profunda femoral, popliteal and calf veins including the posterior tibial, peroneal and gastrocnemius veins when visible. The superficial great saphenous vein was also interrogated. Spectral Doppler was utilized to evaluate flow at rest and with distal augmentation maneuvers in the common femoral, femoral and popliteal veins. COMPARISON:  Bilateral lower extremity venous upper ultrasound 02/26/2014 (positive for DVT involving the right popliteal and tibial veins as well as the left peroneal vein) FINDINGS: RIGHT LOWER EXTREMITY Common Femoral Vein: No evidence of thrombus. Normal compressibility, respiratory phasicity and response to augmentation. Saphenofemoral Junction: No evidence of thrombus. Normal  compressibility and flow on color Doppler imaging. Profunda Femoral Vein: No evidence of thrombus. Normal compressibility and flow on color Doppler imaging. Femoral Vein: No evidence of thrombus. Normal compressibility, respiratory phasicity  and response to augmentation. Popliteal Vein: No evidence of acute or chronic thrombus. Normal compressibility, respiratory phasicity and response to augmentation. Calf Veins: No evidence of acute or chronic thrombus. Normal compressibility and flow on color Doppler imaging. Superficial Great Saphenous Vein: No evidence of thrombus. Normal compressibility. Other Findings:  None. _________________________________________________________ LEFT LOWER EXTREMITY Common Femoral Vein: No evidence of thrombus. Normal compressibility, respiratory phasicity and response to augmentation. Saphenofemoral Junction: No evidence of thrombus. Normal compressibility and flow on color Doppler imaging. Profunda Femoral Vein: No evidence of thrombus. Normal compressibility and flow on color Doppler imaging. Femoral Vein: No evidence of thrombus. Normal compressibility, respiratory phasicity and response to augmentation. Popliteal Vein: No evidence of thrombus. Normal compressibility, respiratory phasicity and response to augmentation. Calf Veins: No evidence of acute or chronic DVT with special attention paid to the left peroneal vein normal compressibility and flow on color Doppler imaging. Superficial Great Saphenous Vein: No evidence of thrombus. Normal compressibility. Other Findings:  None. IMPRESSION: No evidence of acute or chronic DVT within either lower extremity with special attention paid to the right popliteal and tibial veins as well as the left peroneal vein. Electronically Signed   By: Sandi Mariscal M.D.   On: 12/02/2021 10:46   DG Chest Port 1 View  Result Date: 12/01/2021 CLINICAL DATA:  A 36 year old male presents for evaluation of altered mental status. EXAM: PORTABLE CHEST 1 VIEW COMPARISON:  Comparison made with December of 2021. FINDINGS: EKG leads project over the upper chest. Trachea midline. Cardiomediastinal contours and hilar structures with suggestion of cardiac enlargement and with central pulmonary vascular  engorgement. No lobar consolidation. No sign of pleural effusion. No visible pneumothorax. On limited assessment there is no acute skeletal process. IMPRESSION: Cardiomegaly with central pulmonary vascular engorgement. No consolidation or effusion. Electronically Signed   By: Zetta Bills M.D.   On: 12/01/2021 08:39   DG Shoulder Left  Result Date: 12/02/2021 CLINICAL DATA:  Left shoulder pain. EXAM: LEFT SHOULDER - 2+ VIEW COMPARISON:  Left shoulder x-rays dated June 12, 2011. FINDINGS: No acute fracture or dislocation. Joint spaces are preserved. Bone mineralization is normal. Nonspecific soft tissue swelling around the left shoulder. IMPRESSION: 1. Nonspecific left shoulder soft tissue swelling. No acute osseous abnormality. Electronically Signed   By: Titus Dubin M.D.   On: 12/02/2021 09:19   DG FLUORO GUIDED NEEDLE PLC ASPIRATION/INJECTION LOC  Result Date: 12/02/2021 CLINICAL DATA:  Myositis of the left thigh. Suspicion for left hip infection. EXAM: LEFT HIP INJECTION UNDER FLUOROSCOPY COMPARISON:  None. FLUOROSCOPY: Radiation Exposure Index (as provided by the fluoroscopic device): 0.1 mGy mGy Kerma PROCEDURE: After informed consent was obtained explaining the risks, benefits, and possible complications of the procedure, the patient was placed supine on the fluoroscopy table. A formal time procedure was performed according to department of protocol. The left hip was prepped and draped in the usual sterile fashion. 1% lidocaine was used to anesthetize the skin. A 20 gauge spinal needle was advanced into the joint space under fluoroscopic guidance. 3 mL of straw-colored synovial fluid was aspirated from the left hip joint space. The fluid was sent to cytology for evaluation. Hemostasis was achieved. Patient tolerated the procedure well. There were no immediate complications. IMPRESSION: 1. Successful left hip aspiration yielding 3 mL of straw-colored synovial fluid. Technically  successful  hip  injection under fluoroscopy. Electronically Signed   By: Kathreen Devoid M.D.   On: 12/02/2021 11:13   ECHOCARDIOGRAM COMPLETE  Result Date: 12/01/2021    ECHOCARDIOGRAM REPORT   Patient Name:   ISAYAH Aoun Date of Exam: 12/01/2021 Medical Rec #:  [id number]      Height:       72.0 in Accession #:    [id number]     Weight:       179.0 lb Date of Birth:  04-13-1986     BSA:          2.033 m Patient Age:    23 years       BP:           141/94 mmHg Patient Gender: M              HR:           86 bpm. Exam Location:  [facility] Procedure: 2D Echo, Cardiac Doppler and Color Doppler Indications:     Elevated Troponin  History:         Patient has no prior history of Echocardiogram examinations. No                  past medical history on file.  Sonographer:     Sherrie Sport Referring Phys:  [id number] Bradly Bienenstock Diagnosing Phys: Kate Sable MD IMPRESSIONS  1. Left ventricular ejection fraction, by estimation, is 55 to 60%. The left ventricle has normal function. The left ventricle has no regional wall motion abnormalities. Left ventricular diastolic parameters were normal.  2. Right ventricular systolic function is low normal. The right ventricular size is normal.  3. The mitral valve is normal in structure. No evidence of mitral valve regurgitation.  4. The aortic valve is tricuspid. Aortic valve regurgitation is not visualized. FINDINGS  Left Ventricle: Left ventricular ejection fraction, by estimation, is 55 to 60%. The left ventricle has normal function. The left ventricle has no regional wall motion abnormalities. The left ventricular internal cavity size was normal in size. There is  no left ventricular hypertrophy. Left ventricular diastolic parameters were normal. Right Ventricle: The right ventricular size is normal. No increase in right ventricular wall thickness. Right ventricular systolic function is low normal. Left Atrium: Left atrial size was normal in size. Right Atrium: Right atrial size was normal in  size. Pericardium: There is no evidence of pericardial effusion. Mitral Valve: The mitral valve is normal in structure. No evidence of mitral valve regurgitation. MV peak gradient, 3.2 mmHg. The mean mitral valve gradient is 1.0 mmHg. Tricuspid Valve: The tricuspid valve is normal in structure. Tricuspid valve regurgitation is not demonstrated. Aortic Valve: The aortic valve is tricuspid. Aortic valve regurgitation is not visualized. Aortic valve mean gradient measures 2.0 mmHg. Aortic valve peak gradient measures 3.7 mmHg. Aortic valve area, by VTI measures 2.77 cm. Pulmonic Valve: The pulmonic valve was normal in structure. Pulmonic valve regurgitation is not visualized. Aorta: The aortic root is normal in size and structure. Venous: The inferior vena cava was not well visualized. IAS/Shunts: No atrial level shunt detected by color flow Doppler.  LEFT VENTRICLE PLAX 2D LVIDd:         4.50 cm   Diastology LVIDs:         2.70 cm   LV e' medial:    13.10 cm/s LV PW:         1.00 cm   LV E/e' medial:  6.2 LV  IVS:        1.10 cm   LV e' lateral:   14.10 cm/s LVOT diam:     2.00 cm   LV E/e' lateral: 5.7 LV SV:         42 LV SV Index:   21 LVOT Area:     3.14 cm  RIGHT VENTRICLE RV Basal diam:  4.10 cm RV S prime:     10.70 cm/s TAPSE (M-mode): 2.0 cm LEFT ATRIUM             Index        RIGHT ATRIUM           Index LA diam:        3.60 cm 1.77 cm/m   RA Area:     20.90 cm LA Vol (A2C):   54.9 ml 26.99 ml/m  RA Volume:   69.80 ml  34.34 ml/m LA Vol (A4C):   24.8 ml 12.20 ml/m LA Biplane Vol: 44.0 ml 21.65 ml/m  AORTIC VALVE                    PULMONIC VALVE AV Area (Vmax):    2.62 cm     PV Vmax:        0.76 m/s AV Area (Vmean):   2.64 cm     PV Vmean:       44.700 cm/s AV Area (VTI):     2.77 cm     PV VTI:         0.106 m AV Vmax:           96.15 cm/s   PV Peak grad:   2.3 mmHg AV Vmean:          69.200 cm/s  PV Mean grad:   1.0 mmHg AV VTI:            0.152 m      RVOT Peak grad: 3 mmHg AV Peak Grad:       3.7 mmHg AV Mean Grad:      2.0 mmHg LVOT Vmax:         80.30 cm/s LVOT Vmean:        58.200 cm/s LVOT VTI:          0.134 m LVOT/AV VTI ratio: 0.88  AORTA Ao Root diam: 3.45 cm MITRAL VALVE               TRICUSPID VALVE MV Area (PHT): 4.96 cm    TR Peak grad:   11.8 mmHg MV Area VTI:   2.43 cm    TR Vmax:        172.00 cm/s MV Peak grad:  3.2 mmHg MV Mean grad:  1.0 mmHg    SHUNTS MV Vmax:       0.89 m/s    Systemic VTI:  0.13 m MV Vmean:      54.8 cm/s   Systemic Diam: 2.00 cm MV Decel Time: 153 msec    Pulmonic VTI:  0.135 m MV E velocity: 81.00 cm/s MV A velocity: 51.40 cm/s MV E/A ratio:  1.58 Kate Sable MD Electronically signed by Kate Sable MD Signature Date/Time: 12/01/2021/3:14:50 PM    Final    US Abdomen Limited RUQ (LIVER/GB)  Result Date: 12/01/2021 CLINICAL DATA:  Elevated LFTs EXAM: ULTRASOUND ABDOMEN LIMITED RIGHT UPPER QUADRANT COMPARISON:  None. FINDINGS: Gallbladder: No gallstones or wall thickening visualized. No sonographic Murphy sign noted by sonographer. Common bile duct: Diameter: 2 mm Liver:  No focal lesion identified. Within normal limits in parenchymal echogenicity. Portal vein is patent on color Doppler imaging with normal direction of blood flow towards the liver. Other: None. IMPRESSION: Normal right upper quadrant ultrasound. Electronically Signed   By: Maurine Simmering M.D.   On: 12/01/2021 09:14    Positive ROS: All other systems have been reviewed and were otherwise negative with the exception of those mentioned in the HPI and as above.  Physical Exam: General: Lethargic  MUSCULOSKELETAL: Left lower extremity: Left hip: Patient has a bandage over his anterior hip aspiration site.  There is no significant erythema seen.  Patient had mild/moderate pain with passive left hip rotation and flexion.  Range of motion was limited.   Left thigh: Patient has swelling in the left thigh over the anterior lateral middle third.  Patient has compressible compartments.  There  is a small area of erythema/ecchymosis in the posterior lateral the junction of the distal and middle one third which is moderately tender to palpation.  Patient's left knee shows a small effusion without associated erythema or ecchymosis.  Distally the patient's leg compartments are soft compressible.  He has palpable pedal pulses and his foot is well-perfused.  Patient not following commands to test motor or sensory function.  Assessment: Left hip and knee pain with question of left septic joints.  Plan: The hip aspirate shows a white blood cell count of 1667.  The Gram stain cultures are pending.  At this point I would not recommend surgical intervention for the left hip.  Continue IV antibiotics.  Patient's lactic acid level has decreased from 2.4-1.6.  Patient's white count is 18.3 down from 21.4.  Dr. Christian Mate from general surgery has been consulted on the patient's left thigh myositis with question of necrotizing fasciitis.  The critical care nurse practitioner note this morning states that Dr. Christian Mate evaluated the patient at the bedside and did not feel the findings were consistent with necrotizing fasciitis.  Patient's total CK level is 19,295 down from greater than 50,000 upon arrival.  His troponin was 2400 and his come down to 1794.  An MRI with and without contrast is pending for further evaluation of the left hip and knee joints as well as the left thigh.  I aspirated the patient's left knee at the bedside.  A few cc of blood-tinged synovial fluid was collected and brought to the laboratory for cell count, crystals, Gram stain and culture analysis.  Procedure note: Patient's left knee was prepped sterilely.  10 cc of 1% lidocaine plain was injected into the subcutaneous tissue using a 22-gauge needle.  An 18-gauge spinal needle on a 20 cc syringe was then used to aspirate the left knee through a superior lateral approach with the knee in extension.  Few cc of blood-tinged synovial  fluid was collected and brought to the [facility] laboratory for analysis as noted above.  I will follow-up on the Gram stain and cultures from the left hip and knee when available.  I will review the MRI of the left femur when available.   Thornton Park, MD    12/02/2021 5:02 PM

## 2021-12-02 NOTE — Consult Note (Signed)
[facility] Consult   Reason for Consult: Follow-up consult 36 year old man with polysubstance abuse who was admitted with delirium. Referring Physician:  K[place] Patient Identification: Edwin [name] MRN:  [id number] Principal Diagnosis: Acute metabolic encephalopathy Diagnosis:  Principal Problem:   Acute metabolic encephalopathy Active Problems:   Non-ST elevation (NSTEMI) myocardial infarction ([place])   Cocaine abuse ([place])   Cocaine poisoning ([place])   Rhabdomyolysis   ATN (acute tubular necrosis) (HCC)   Hyperkalemia   Myositis   Total Time spent with patient: 30 minutes  Subjective:   Edwin [name] is a 36 y.o. male patient admitted with patient indicates that his hearing is improved but his leg pain is worse.  HPI: Patient seen and chart reviewed.  Patient was very sleepy today.  He was able to wake up briefly when his name was spoken loudly and to answer 1 or 2 questions but then went right back to sleep.  Some relatives were present who stated that he is complaining more about his leg pain today but his hearing seems better.  To my examination the patient was able to hear normal spoken speech much better than he was yesterday.  Did not require with everything to be written down or to be shouted.  No aggression or agitation.  No complaints of opiate withdrawal.  Has so far been at least basically cooperative with treatment although it sounds like he is required some encouragement from family  Past Psychiatric History: Past history of chronic severe substance abuse multiple types as well as depression  Risk to Self:   Risk to Others:   Prior Inpatient Therapy:   Prior Outpatient Therapy:    Past Medical History: No past medical history on file.  Family History: No family history on file. Family Psychiatric  History: See previous Social History:  Social History   Substance and Sexual Activity  Alcohol Use Not on file     Social History   Substance and  Sexual Activity  Drug Use Not on file    Social History   Socioeconomic History   Marital status: Single    Spouse name: Not on file   Number of children: Not on file   Years of education: Not on file   Highest education level: Not on file  Occupational History   Not on file  Tobacco Use   Smoking status: Not on file   Smokeless tobacco: Not on file  Substance and Sexual Activity   Alcohol use: Not on file   Drug use: Not on file   Sexual activity: Not on file  Other Topics Concern   Not on file  Social History Narrative   Not on file   Social Determinants of Health   Financial Resource Strain: Not on file  Food Insecurity: Not on file  Transportation Needs: Not on file  Physical Activity: Not on file  Stress: Not on file  Social Connections: Not on file   Additional Social History:    Allergies:  No Known Allergies  Labs:  Results for orders placed or performed during the hospital encounter of 12/01/21 (from the past 48 hour(s))  Ethanol     Status: None   Collection Time: 12/01/21  6:07 AM  Result Value Ref Range   Alcohol, Ethyl (B) <10 <10 mg/dL    Comment: (NOTE) Lowest detectable limit for serum alcohol is 10 mg/dL.  For medical purposes only. Performed at [facility], [address] [place], [place] [address]   Ammonia  Status: None   Collection Time: 12/01/21  6:07 AM  Result Value Ref Range   Ammonia 21 9 - 35 umol/L    Comment: Performed at  [facility], [address] [place], [place] [address]  CBC with Differential/Platelet     Status: Abnormal   Collection Time: 12/01/21  6:07 AM  Result Value Ref Range   WBC 21.4 (H) 4.0 - 10.5 K/uL   RBC 5.13 4.22 - 5.81 MIL/uL   Hemoglobin 15.9 13.0 - 17.0 g/dL   HCT 47.3 39.0 - 52.0 %   MCV 92.2 80.0 - 100.0 fL   MCH 31.0 26.0 - 34.0 pg   MCHC 33.6 30.0 - 36.0 g/dL   RDW 12.4 11.5 - 15.5 %   Platelets 329 150 - 400 K/uL   nRBC 0.0 0.0 - 0.2 %   Neutrophils Relative % 88  %   Neutro Abs 18.7 (H) 1.7 - 7.7 K/uL   Lymphocytes Relative 5 %   Lymphs Abs 1.1 0.7 - 4.0 K/uL   Monocytes Relative 6 %   Monocytes Absolute 1.3 (H) 0.1 - 1.0 K/uL   Eosinophils Relative 0 %   Eosinophils Absolute 0.0 0.0 - 0.5 K/uL   Basophils Relative 0 %   Basophils Absolute 0.1 0.0 - 0.1 K/uL   Immature Granulocytes 1 %   Abs Immature Granulocytes 0.20 (H) 0.00 - 0.07 K/uL    Comment: Performed at [facility], [address] [place], [place] [address]  Comprehensive metabolic panel     Status: Abnormal   Collection Time: 12/01/21  6:07 AM  Result Value Ref Range   Sodium 133 (L) 135 - 145 mmol/L   Potassium 6.6 (HH) 3.5 - 5.1 mmol/L    Comment: CRITICAL RESULT CALLED TO, READ BACK BY AND VERIFIED WITH STEPHENY RUDD@[id number]  12/01/21 RH    Chloride 96 (L) 98 - 111 mmol/L   CO2 22 22 - 32 mmol/L   Glucose, Bld 210 (H) 70 - 99 mg/dL    Comment: Glucose reference range applies only to samples taken after fasting for at least 8 hours.   BUN 46 (H) 6 - 20 mg/dL   Creatinine, Ser 3.28 (H) 0.61 - 1.24 mg/dL   Calcium 7.3 (L) 8.9 - 10.3 mg/dL   Total Protein 8.6 (H) 6.5 - 8.1 g/dL   Albumin 3.9 3.5 - 5.0 g/dL   AST 1,510 (H) 15 - 41 U/L   ALT 725 (H) 0 - 44 U/L   Alkaline Phosphatase 105 38 - 126 U/L   Total Bilirubin 0.3 0.3 - 1.2 mg/dL   GFR, Estimated 24 (L) >60 mL/min    Comment: (NOTE) Calculated using the CKD-EPI Creatinine Equation (2021)    Anion gap 15 5 - 15    Comment: Performed at [facility], [address] [place], [place] [address]  Lipase, blood     Status: None   Collection Time: 12/01/21  6:07 AM  Result Value Ref Range   Lipase 41 11 - 51 U/L    Comment: Performed at [facility], [address] [place], [place] [id number]  Salicylate level     Status: Abnormal   Collection Time: 12/01/21  6:07 AM  Result Value Ref Range   Salicylate Lvl [id number] (L) 7.0 - 30.0 mg/dL    Comment: Performed at [facility], [address], [place], [place] [address]  Acetaminophen level     Status: None   Collection Time: 12/01/21  6:07 AM  Result Value Ref Range   Acetaminophen (Tylenol), Serum 19 10 - 30 ug/mL    Comment: (NOTE) Therapeutic concentrations vary significantly. A range of 10-30 ug/mL  may be an effective concentration for many patients. However, some  are best treated at concentrations outside of this range. Acetaminophen concentrations >150 ug/mL at 4 hours after ingestion  and >50 ug/mL at 12 hours after ingestion are often associated with  toxic reactions.  Performed at [facility], [address] [place], [place] [address]   Magnesium     Status: Abnormal   Collection Time: 12/01/21  6:07 AM  Result Value Ref Range   Magnesium 2.6 (H) 1.7 - 2.4 mg/dL    Comment: Performed at [facility], [address] [place], [place] [address]  Osmolality     Status: Abnormal   Collection Time: 12/01/21  6:07 AM  Result Value Ref Range   Osmolality 307 (H) 275 - 295 mOsm/kg    Comment: Performed at [facility], [address] [place], [place] [address]  CK     Status: Abnormal   Collection Time: 12/01/21  6:07 AM  Result Value Ref Range   Total CK >50,000 (H) 49 - 397 U/L    Comment: RESULT CONFIRMED BY MANUAL DILUTION/HKP Performed at [facility], [address] [place], [place] [address]   Procalcitonin - Baseline     Status: None   Collection Time: 12/01/21  6:07 AM  Result Value Ref Range   Procalcitonin 9.18 ng/mL    Comment:        Interpretation: PCT > 2 ng/mL: Systemic infection (sepsis) is likely, unless other causes are known. (NOTE)       Sepsis PCT Algorithm           Lower Respiratory Tract                                      Infection PCT Algorithm    ----------------------------     ----------------------------         PCT < 0.25 ng/mL                PCT < 0.10 ng/mL          Strongly encourage             Strongly discourage    discontinuation of antibiotics    initiation of antibiotics    ----------------------------     -----------------------------       PCT 0.25 - 0.50 ng/mL            PCT 0.10 - 0.25 ng/mL               OR       >80% decrease in PCT            Discourage initiation of                                            antibiotics      Encourage discontinuation           of antibiotics    ----------------------------     -----------------------------         PCT >= 0.50 ng/mL              PCT  0.26 - 0.50 ng/mL               AND       <80% decrease in PCT              Encourage initiation of                                             antibiotics       Encourage continuation           of antibiotics    ----------------------------     -----------------------------        PCT >= 0.50 ng/mL                  PCT > 0.50 ng/mL               AND         increase in PCT                  Strongly encourage                                      initiation of antibiotics    Strongly encourage escalation           of antibiotics                                     -----------------------------                                           PCT <= 0.25 ng/mL                                                 OR                                        > 80% decrease in PCT                                      Discontinue / Do not initiate                                             antibiotics  Performed at [facility], [address] [place],  [address]   Troponin I (High Sensitivity)     Status: Abnormal   Collection Time: 12/01/21  6:07 AM  Result Value Ref Range   Troponin I (High Sensitivity) 1,334 (HH) <18 ng/L    Comment: CRITICAL RESULT CALLED TO, READ BACK BY AND VERIFIED WITH BRIANNA CHAPMAN@0847  ON 12/01/21 BY HKP (NOTE) Elevated high sensitivity troponin I (hsTnI) values and significant  changes across serial measurements  may suggest ACS but many other  chronic and acute  conditions are known to elevate hsTnI results.  Refer to the "Links" section for chest pain algorithms and additional  guidance. Performed at [facility], [address] [place], [place] [address]   Blood gas, arterial     Status: Abnormal   Collection Time: 12/01/21  7:13 AM  Result Value Ref Range   FIO2 0.21    pH, Arterial 7.31 (L) 7.350 - 7.450   pCO2 arterial 43 32.0 - 48.0 mmHg   pO2, Arterial 77 (L) 83.0 - 108.0 mmHg   Bicarbonate 21.7 20.0 - 28.0 mmol/L   Acid-base deficit 4.5 (H) 0.0 - 2.0 mmol/L   O2 Saturation 93.9 %   Patient temperature 37.0    Collection site LEFT RADIAL    Sample type ARTERIAL DRAW    Allens test (pass/fail) PASS PASS    Comment: Performed at [facility], [address] [place], [place] [address]  Lactic acid, plasma     Status: Abnormal   Collection Time: 12/01/21  8:16 AM  Result Value Ref Range   Lactic Acid, Venous 3.2 (HH) 0.5 - 1.9 mmol/L    Comment: CRITICAL RESULT CALLED TO, READ BACK BY AND VERIFIED WITH BRIANNA CHAPMAN@02 /01/23 BY HKP Performed at [facility], [address], [place],  [address]   Resp Panel by RT-PCR (Flu A&B, Covid) Nasopharyngeal Swab     Status: None   Collection Time: 12/01/21  8:16 AM   Specimen: Nasopharyngeal Swab; Nasopharyngeal(NP) swabs in vial transport medium  Result Value Ref Range   SARS Coronavirus 2 by RT PCR NEGATIVE NEGATIVE    Comment: (NOTE) SARS-CoV-2 target nucleic acids are NOT DETECTED.  The SARS-CoV-2 RNA is generally detectable in upper respiratory specimens during the acute phase of infection. The lowest concentration of SARS-CoV-2 viral copies this assay can detect is 138 copies/mL. A negative result does not preclude SARS-Cov-2 infection and should not be used as the sole basis for treatment or other patient management decisions. A negative result may occur with  improper specimen collection/handling, submission of specimen other than  nasopharyngeal swab, presence of viral mutation(s) within the areas targeted by this assay, and inadequate number of viral copies(<138 copies/mL). A negative result must be combined with clinical observations, patient history, and epidemiological information. The expected result is Negative.  Fact Sheet for Patients:  EntrepreneurPulse.com.au  Fact Sheet for Healthcare Providers:  IncredibleEmployment.be  This test is no t yet approved or cleared by the [place] FDA and  has been authorized for detection and/or diagnosis of SARS-CoV-2 by FDA under an Emergency Use Authorization (EUA). This EUA will remain  in effect (meaning this test can be used) for the duration of the COVID-19 declaration under Section 564(b)(1) of the Act, 21 U.S.C.section 360bbb-3(b)(1), unless the authorization is terminated  or revoked sooner.       Influenza A by PCR NEGATIVE NEGATIVE   Influenza B by PCR NEGATIVE NEGATIVE    Comment: (NOTE) The Xpert Xpress SARS-CoV-2/FLU/RSV plus assay is intended as an aid in the diagnosis of influenza from Nasopharyngeal swab specimens and should not be used as a sole basis for treatment. Nasal washings and aspirates are unacceptable for Xpert Xpress SARS-CoV-2/FLU/RSV testing.  Fact Sheet for Patients: EntrepreneurPulse.com.au  Fact Sheet for Healthcare Providers: IncredibleEmployment.be  This test is not yet approved or cleared by the [place] FDA and has been authorized for detection and/or diagnosis of SARS-CoV-2 by FDA under an  Emergency Use Authorization (EUA). This EUA will remain in effect (meaning this test can be used) for the duration of the COVID-19 declaration under Section 564(b)(1) of the Act, 21 U.S.C. section 360bbb-3(b)(1), unless the authorization is terminated or revoked.  Performed at [facility], [address] [place], [place] [address]    Hepatitis panel, acute     Status: Abnormal   Collection Time: 12/01/21  8:16 AM  Result Value Ref Range   Hepatitis B Surface Ag NON REACTIVE NON REACTIVE   HCV Ab Reactive (A) NON REACTIVE    Comment: (NOTE) The CDC recommends that a Reactive HCV antibody result be followed up  with a HCV Nucleic Acid Amplification test.     Hep A IgM NON REACTIVE NON REACTIVE   Hep B C IgM NON REACTIVE NON REACTIVE    Comment: Performed at [facility] [address], [place], [place] [address]  HIV Antibody (routine testing w rflx)     Status: None   Collection Time: 12/01/21  8:46 AM  Result Value Ref Range   HIV Screen 4th Generation wRfx Non Reactive Non Reactive    Comment: Performed at [facility], [place] [address], [place], [place] [address]  Protime-INR     Status: Abnormal   Collection Time: 12/01/21  8:46 AM  Result Value Ref Range   Prothrombin Time 15.8 (H) 11.4 - 15.2 seconds   INR 1.3 (H) 0.8 - 1.2    Comment: (NOTE) INR goal varies based on device and disease states. Performed at [facility], [address] [place], [place] [address]   APTT     Status: None   Collection Time: 12/01/21  8:46 AM  Result Value Ref Range   aPTT 32 24 - 36 seconds    Comment: Performed at [facility], [address] [place], [place] [address]  Glucose, capillary     Status: Abnormal   Collection Time: 12/01/21  9:46 AM  Result Value Ref Range   Glucose-Capillary 123 (H) 70 - 99 mg/dL    Comment: Glucose reference range applies only to samples taken after fasting for at least 8 hours.  Urine Drug Screen, Qualitative ([facility] only)     Status: Abnormal   Collection Time: 12/01/21 10:00 AM  Result Value Ref Range   Tricyclic, Ur Screen NONE DETECTED NONE DETECTED   Amphetamines, Ur Screen NONE DETECTED NONE DETECTED   MDMA (Ecstasy)Ur Screen NONE DETECTED NONE DETECTED   Cocaine Metabolite,Ur [place] POSITIVE (A) NONE DETECTED   Opiate, Ur Screen NONE DETECTED NONE  DETECTED   Phencyclidine (PCP) Ur S NONE DETECTED NONE DETECTED   Cannabinoid 50 Ng, Ur [place] NONE DETECTED NONE DETECTED   Barbiturates, Ur Screen NONE DETECTED NONE DETECTED   Benzodiazepine, Ur Scrn POSITIVE (A) NONE DETECTED   Methadone Scn, Ur NONE DETECTED NONE DETECTED    Comment: (NOTE) Tricyclics + metabolites, urine    Cutoff 1000 ng/mL Amphetamines + metabolites, urine  Cutoff 1000 ng/mL MDMA (Ecstasy), urine              Cutoff 500 ng/mL Cocaine Metabolite, urine          Cutoff 300 ng/mL Opiate + metabolites, urine        Cutoff 300 ng/mL Phencyclidine (PCP), urine         Cutoff 25 ng/mL Cannabinoid, urine                 Cutoff 50 ng/mL Barbiturates +  metabolites, urine  Cutoff 200 ng/mL Benzodiazepine, urine              Cutoff 200 ng/mL Methadone, urine                   Cutoff 300 ng/mL  The urine drug screen provides only a preliminary, unconfirmed analytical test result and should not be used for non-medical purposes. Clinical consideration and professional judgment should be applied to any positive drug screen result due to possible interfering substances. A more specific alternate chemical method must be used in order to obtain a confirmed analytical result. Gas chromatography / mass spectrometry (GC/MS) is the preferred confirm atory method. Performed at [facility], [address] [place], [place] [address]   Urinalysis, Complete w Microscopic Urine, Catheterized     Status: Abnormal   Collection Time: 12/01/21 10:00 AM  Result Value Ref Range   Color, Urine BROWN (A) YELLOW   APPearance CLEAR (A) CLEAR   Specific Gravity, Urine 1.016 1.005 - 1.030   pH  5.0 - 8.0    TEST NOT REPORTED DUE TO COLOR INTERFERENCE OF URINE PIGMENT   Glucose, UA (A) NEGATIVE mg/dL    TEST NOT REPORTED DUE TO COLOR INTERFERENCE OF URINE PIGMENT   Hgb urine dipstick (A) NEGATIVE    TEST NOT REPORTED DUE TO COLOR INTERFERENCE OF URINE PIGMENT   Bilirubin Urine (A)  NEGATIVE    TEST NOT REPORTED DUE TO COLOR INTERFERENCE OF URINE PIGMENT   Ketones, ur (A) NEGATIVE mg/dL    TEST NOT REPORTED DUE TO COLOR INTERFERENCE OF URINE PIGMENT   Protein, ur (A) NEGATIVE mg/dL    TEST NOT REPORTED DUE TO COLOR INTERFERENCE OF URINE PIGMENT   Nitrite (A) NEGATIVE    TEST NOT REPORTED DUE TO COLOR INTERFERENCE OF URINE PIGMENT   Leukocytes,Ua (A) NEGATIVE    TEST NOT REPORTED DUE TO COLOR INTERFERENCE OF URINE PIGMENT   RBC / HPF 0-5 0 - 5 RBC/hpf   WBC, UA 6-10 0 - 5 WBC/hpf   Bacteria, UA RARE (A) NONE SEEN   Squamous Epithelial / LPF NONE SEEN 0 - 5   Mucus PRESENT     Comment: Performed at [facility], [address] [place], [place] [address]  MRSA Next Gen by PCR, Nasal     Status: None   Collection Time: 12/01/21 10:00 AM   Specimen: Urine, Catheterized; Nasal Swab  Result Value Ref Range   MRSA by PCR Next Gen NOT DETECTED NOT DETECTED    Comment: (NOTE) The GeneXpert MRSA Assay (FDA approved for NASAL specimens only), is one component of a comprehensive MRSA colonization surveillance program. It is not intended to diagnose MRSA infection nor to guide or monitor treatment for MRSA infections. Test performance is not FDA approved in patients less than 11 years old. Performed at [facility], [address] [place], [place] [address]   Lactic acid, plasma     Status: Abnormal   Collection Time: 12/01/21 10:14 AM  Result Value Ref Range   Lactic Acid, Venous 2.4 (HH) 0.5 - 1.9 mmol/L    Comment: CRITICAL VALUE NOTED. VALUE IS CONSISTENT WITH PREVIOUSLY REPORTED/CALLED VALUE/HKP Performed at [facility], [place], [place] [address]   Troponin I (High Sensitivity)     Status: Abnormal   Collection Time: 12/01/21 11:40 AM  Result Value Ref Range   Troponin I (High Sensitivity) 2,411 (HH) <18 ng/L    Comment: CRITICAL  VALUE NOTED. VALUE IS CONSISTENT WITH PREVIOUSLY REPORTED/CALLED VALUE  SCS (NOTE) Elevated high sensitivity troponin I (hsTnI) values and significant  changes across serial measurements may suggest ACS but many other  chronic and acute conditions are known to elevate hsTnI results.  Refer to the "Links" section for chest pain algorithms and additional  guidance. Performed at [facility], [address] [place], [place] [id number]   Basic metabolic panel     Status: Abnormal   Collection Time: 12/01/21  2:32 PM  Result Value Ref Range   Sodium 131 (L) 135 - 145 mmol/L   Potassium 5.1 3.5 - 5.1 mmol/L   Chloride 101 98 - 111 mmol/L   CO2 22 22 - 32 mmol/L   Glucose, Bld 110 (H) 70 - 99 mg/dL    Comment: Glucose reference range applies only to samples taken after fasting for at least 8 hours.   BUN 53 (H) 6 - 20 mg/dL   Creatinine, Ser 3.53 (H) 0.61 - 1.24 mg/dL   Calcium 6.8 (L) 8.9 - 10.3 mg/dL   GFR, Estimated 22 (L) >60 mL/min    Comment: (NOTE) Calculated using the CKD-EPI Creatinine Equation (2021)    Anion gap 8 5 - 15    Comment: Performed at [facility], [address] [place], [place] [address]  Acetaminophen level (recheck)     Status: Abnormal   Collection Time: 12/01/21  2:32 PM  Result Value Ref Range   Acetaminophen (Tylenol), Serum <10 (L) 10 - 30 ug/mL    Comment: (NOTE) Therapeutic concentrations vary significantly. A range of 10-30 ug/mL  may be an effective concentration for many patients. However, some  are best treated at concentrations outside of this range. Acetaminophen concentrations >150 ug/mL at 4 hours after ingestion  and >50 ug/mL at 12 hours after ingestion are often associated with  toxic reactions.  Performed at [facility], [address] [place], [place] [address]   Troponin I (High Sensitivity)     Status: Abnormal   Collection Time: 12/01/21  2:32 PM  Result Value Ref Range   Troponin I (High Sensitivity) 2,444 (HH) <18 ng/L    Comment: CRITICAL VALUE NOTED. VALUE IS  CONSISTENT WITH PREVIOUSLY REPORTED/CALLED VALUE SCS (NOTE) Elevated high sensitivity troponin I (hsTnI) values and significant  changes across serial measurements may suggest ACS but many other  chronic and acute conditions are known to elevate hsTnI results.  Refer to the "Links" section for chest pain algorithms and additional  guidance. Performed at [facility], [address] [place], [place] [address]   Lactic acid, plasma     Status: Abnormal   Collection Time: 12/01/21  2:32 PM  Result Value Ref Range   Lactic Acid, Venous 2.4 (HH) 0.5 - 1.9 mmol/L    Comment: CRITICAL VALUE NOTED. VALUE IS CONSISTENT WITH PREVIOUSLY REPORTED/CALLED VALUE SCS Performed at [facility], [place], [place] [address]   Heparin level (unfractionated)     Status: Abnormal   Collection Time: 12/01/21  5:56 PM  Result Value Ref Range   Heparin Unfractionated <0.10 (L) 0.30 - 0.70 IU/mL    Comment: REPEATED TO VERIFY (NOTE) The clinical reportable range upper limit is being lowered to >1.10 to align with the FDA approved guidance for the current laboratory assay.  If heparin results are below expected values, and patient dosage has  been confirmed, suggest follow up testing of antithrombin III levels. Performed at [facility], [phone]  [place], [place] [address]   Troponin I (High Sensitivity)     Status: Abnormal   Collection Time: 12/01/21  5:56 PM  Result Value Ref Range   Troponin I (High Sensitivity) 1,794 (HH) <18 ng/L    Comment: CRITICAL VALUE NOTED. VALUE IS CONSISTENT WITH PREVIOUSLY REPORTED/CALLED VALUE SKL (NOTE) Elevated high sensitivity troponin I (hsTnI) values and significant  changes across serial measurements may suggest ACS but many other  chronic and acute conditions are known to elevate hsTnI results.  Refer to the "Links" section for chest pain algorithms and additional  guidance. Performed at [facility], [address] [place], [place] [id number]   Basic metabolic panel     Status: Abnormal   Collection Time: 12/01/21  9:47 PM  Result Value Ref Range   Sodium 131 (L) 135 - 145 mmol/L   Potassium 4.6 3.5 - 5.1 mmol/L   Chloride 101 98 - 111 mmol/L   CO2 19 (L) 22 - 32 mmol/L   Glucose, Bld 97 70 - 99 mg/dL    Comment: Glucose reference range applies only to samples taken after fasting for at least 8 hours.   BUN 59 (H) 6 - 20 mg/dL   Creatinine, Ser 4.03 (H) 0.61 - 1.24 mg/dL   Calcium 6.7 (L) 8.9 - 10.3 mg/dL   GFR, Estimated 19 (L) >60 mL/min    Comment: (NOTE) Calculated using the CKD-EPI Creatinine Equation (2021)    Anion gap 11 5 - 15    Comment: Performed at [facility], [address] [place], [place] [address]  Lactic acid, plasma     Status: Abnormal   Collection Time: 12/01/21  9:47 PM  Result Value Ref Range   Lactic Acid, Venous 2.2 (HH) 0.5 - 1.9 mmol/L    Comment: CRITICAL VALUE NOTED. VALUE IS CONSISTENT WITH PREVIOUSLY REPORTED/CALLED VALUE SKL Performed at [facility], [address] [place], [place] [address]   Procalcitonin - Baseline     Status: None   Collection Time: 12/01/21  9:47 PM  Result Value Ref Range   Procalcitonin 12.50 ng/mL    Comment:        Interpretation: PCT >= 10 ng/mL: Important systemic inflammatory response, almost exclusively due to severe bacterial sepsis or septic shock. (NOTE)       Sepsis PCT Algorithm           Lower Respiratory Tract                                      Infection PCT Algorithm    ----------------------------     ----------------------------         PCT < 0.25 ng/mL                PCT < 0.10 ng/mL          Strongly encourage             Strongly discourage   discontinuation of antibiotics    initiation of antibiotics    ----------------------------     -----------------------------       PCT 0.25 - 0.50 ng/mL            PCT 0.10 - 0.25 ng/mL               OR       >80%  decrease in PCT  Discourage initiation of °                                           antibiotics °     Encourage discontinuation °          of antibiotics °   ----------------------------     ----------------------------- °        PCT >= 0.50 ng/mL              PCT 0.26 - 0.50 ng/mL °               AND °      <80% decrease in PCT             Encourage initiation of °                                            antibiotics °      Encourage continuation °          of antibiotics °   ----------------------------     ----------------------------- °       PCT >= 0.50 ng/mL                  PCT > 0.50 ng/mL °              AND °        increase in PCT                  Strongly encourage °                                     initiation of antibiotics °   Strongly encourage escalation °          of antibiotics °                                    ----------------------------- °                                          PCT <= 0.25 ng/mL °                                                OR °                                       > 80% decrease in PCT ° °                                    Discontinue / Do not initiate °                                              antibiotics  Performed at [facility], [address] [place], [place] [address]   Lactic acid, plasma     Status: None   Collection Time: 12/02/21 12:06 AM  Result Value Ref Range   Lactic Acid, Venous 1.6 0.5 - 1.9 mmol/L    Comment: Performed at [facility], [address] [place], [place] [address]  Comprehensive metabolic panel     Status: Abnormal   Collection Time: 12/02/21  8:55 AM  Result Value Ref Range   Sodium 132 (L) 135 - 145 mmol/L   Potassium 4.3 3.5 - 5.1 mmol/L   Chloride 101 98 - 111 mmol/L   CO2 19 (L) 22 - 32 mmol/L   Glucose, Bld 111 (H) 70 - 99 mg/dL    Comment: Glucose reference range applies only to samples taken after fasting for at least 8 hours.   BUN 65 (H) 6 - 20 mg/dL   Creatinine, Ser  4.76 (H) 0.61 - 1.24 mg/dL   Calcium 6.9 (L) 8.9 - 10.3 mg/dL   Total Protein 5.7 (L) 6.5 - 8.1 g/dL   Albumin 2.5 (L) 3.5 - 5.0 g/dL   AST 711 (H) 15 - 41 U/L   ALT 634 (H) 0 - 44 U/L   Alkaline Phosphatase 80 38 - 126 U/L   Total Bilirubin 0.6 0.3 - 1.2 mg/dL   GFR, Estimated 15 (L) >60 mL/min    Comment: (NOTE) Calculated using the CKD-EPI Creatinine Equation (2021)    Anion gap 12 5 - 15    Comment: Performed at [facility], [address] [place], [place] [address]  CK     Status: Abnormal   Collection Time: 12/02/21  8:55 AM  Result Value Ref Range   Total CK 19,295 (H) 49 - 397 U/L    Comment: RESULT CONFIRMED BY MANUAL DILUTION/HKP Performed at [facility], [address] [place], [place] [address]   CBC     Status: Abnormal   Collection Time: 12/02/21  8:55 AM  Result Value Ref Range   WBC 18.3 (H) 4.0 - 10.5 K/uL   RBC 4.86 4.22 - 5.81 MIL/uL   Hemoglobin 15.0 13.0 - 17.0 g/dL   HCT 43.9 39.0 - 52.0 %   MCV 90.3 80.0 - 100.0 fL   MCH 30.9 26.0 - 34.0 pg   MCHC 34.2 30.0 - 36.0 g/dL   RDW 12.6 11.5 - 15.5 %   Platelets 237 150 - 400 K/uL   nRBC 0.0 0.0 - 0.2 %    Comment: Performed at [facility], [place], [place] [address]  Heparin level (unfractionated)     Status: Abnormal   Collection Time: 12/02/21  8:55 AM  Result Value Ref Range   Heparin Unfractionated <0.10 (L) 0.30 - 0.70 IU/mL    Comment: (NOTE) The clinical reportable range upper limit is being lowered to >1.10 to align with the FDA approved guidance for the current laboratory assay.  If heparin results are below expected values, and patient dosage has  been confirmed, suggest follow up testing of antithrombin III levels. Performed at [facility], [address] [place],  [address]   Procalcitonin     Status: None   Collection Time: 12/02/21  8:55 AM  Result Value Ref Range   Procalcitonin 9.21 ng/mL    Comment:         Interpretation: PCT > 2 ng/mL: Systemic infection (sepsis) is likely, unless other causes are known. (NOTE)  Sepsis PCT Algorithm           Lower Respiratory Tract                                      Infection PCT Algorithm    ----------------------------     ----------------------------         PCT < 0.25 ng/mL                PCT < 0.10 ng/mL          Strongly encourage             Strongly discourage   discontinuation of antibiotics    initiation of antibiotics    ----------------------------     -----------------------------       PCT 0.25 - 0.50 ng/mL            PCT 0.10 - 0.25 ng/mL               OR       >80% decrease in PCT            Discourage initiation of                                            antibiotics      Encourage discontinuation           of antibiotics    ----------------------------     -----------------------------         PCT >= 0.50 ng/mL              PCT 0.26 - 0.50 ng/mL               AND       <80% decrease in PCT              Encourage initiation of                                             antibiotics       Encourage continuation           of antibiotics    ----------------------------     -----------------------------        PCT >= 0.50 ng/mL                  PCT > 0.50 ng/mL               AND         increase in PCT                  Strongly encourage                                      initiation of antibiotics    Strongly encourage escalation           of antibiotics                                     -----------------------------  PCT <= 0.25 ng/mL                                                 OR                                        > 80% decrease in PCT                                      Discontinue / Do not initiate                                             antibiotics  Performed at [facility], [address] [place], [place] [address]   Synovial cell count +  diff, w/ crystals     Status: Abnormal   Collection Time: 12/02/21 11:25 AM  Result Value Ref Range   Color, Synovial ORANGE (A) YELLOW   Appearance-Synovial CLOUDY (A) CLEAR   Crystals, Fluid NO CRYSTALS SEEN    WBC, Synovial 1,667 (H) 0 - 200 /cu mm   Neutrophil, Synovial 95 (H) 0 - 25 %   Lymphocytes-Synovial Fld 0 0 - 20 %   Monocyte-Macrophage-Synovial Fluid 5 (L) 50 - 90 %   Eosinophils-Synovial 0 0 - 1 %    Comment: Performed at [facility], [address], [place], [place] [address]    Current Facility-Administered Medications  Medication Dose Route Frequency Provider Last Rate Last Admin   0.9 %  sodium chloride infusion   Intravenous Continuous Lateef, Munsoor, MD 175 mL/hr at 12/02/21 1600 Infusion Verify at 12/02/21 1600   Chlorhexidine Gluconate Cloth 2 % PADS 6 each  6 each Topical Daily Flora Lipps, MD   6 each at 12/02/21 0823   clindamycin (CLEOCIN) IVPB 600 mg  600 mg Intravenous Q8H Lang Snow, NP 100 mL/hr at 12/02/21 1257 600 mg at 12/02/21 1257   dexmedetomidine (PRECEDEX) 400 MCG/100ML (4 mcg/mL) infusion  0.4-1.2 mcg/kg/hr Intravenous Titrated Darel Hong D, NP 12.89 mL/hr at 12/02/21 1600 0.6 mcg/kg/hr at 12/02/21 1600   diazepam (VALIUM) injection 2.5 mg  2.5 mg Intravenous Q6H PRN Flora Lipps, MD   2.5 mg at 12/01/21 1208   docusate sodium (COLACE) capsule 100 mg  100 mg Oral BID PRN Bradly Bienenstock, NP       fentaNYL (SUBLIMAZE) injection 25 mcg  25 mcg Intravenous Q2H PRN Darel Hong D, NP   25 mcg at 12/02/21 1154   haloperidol lactate (HALDOL) injection 2 mg  2 mg Intravenous Q6H PRN Egon Dittus T, MD       heparin ADULT infusion 100 units/mL (25000 units/231mL)  1,550 Units/hr Intravenous Continuous Flora Lipps, MD 15.5 mL/hr at 12/02/21 1600 1,550 Units/hr at 12/02/21 1600   isosorbide mononitrate (IMDUR) 24 hr tablet 15 mg  15 mg Oral Daily Kate Sable, MD   15 mg at 12/02/21 0832   pantoprazole (PROTONIX)  injection 40 mg  40 mg Intravenous Q24H Darel Hong D, NP   40 mg at 12/02/21 0749   piperacillin-tazobactam (  ZOSYN) IVPB 3.375 g  3.375 g Intravenous Q8H Jimmye Norman, NP 12.5 mL/hr at 12/02/21 1600 Infusion Verify at 12/02/21 1600   polyethylene glycol (MIRALAX / GLYCOLAX) packet 17 g  17 g Oral Daily PRN Harlon Ditty D, NP       vancomycin variable dose per unstable renal function (pharmacist dosing)   Does not apply See admin instructions Jaynie Bream, [facility]        Musculoskeletal: Strength & Muscle Tone: decreased Gait & Station: unable to stand Patient leans: N/A            Psychiatric Specialty Exam:  Presentation  General Appearance: No data recorded Eye Contact:No data recorded Speech:No data recorded Speech Volume:No data recorded Handedness:No data recorded  Mood and Affect  Mood:No data recorded Affect:No data recorded  Thought Process  Thought Processes:No data recorded Descriptions of Associations:No data recorded Orientation:No data recorded Thought Content:No data recorded History of Schizophrenia/Schizoaffective disorder:No data recorded Duration of Psychotic Symptoms:No data recorded Hallucinations:No data recorded Ideas of Reference:No data recorded Suicidal Thoughts:No data recorded Homicidal Thoughts:No data recorded  Sensorium  Memory:No data recorded Judgment:No data recorded Insight:No data recorded  Executive Functions  Concentration:No data recorded Attention Span:No data recorded Recall:No data recorded Fund of Knowledge:No data recorded Language:No data recorded  Psychomotor Activity  Psychomotor Activity:No data recorded  Assets  Assets:No data recorded  Sleep  Sleep:No data recorded  Physical Exam: Physical Exam Vitals reviewed.  Constitutional:      Appearance: He is ill-appearing.  HENT:     Head: Normocephalic and atraumatic.     Mouth/Throat:     Pharynx: Oropharynx is clear.  Eyes:      Pupils: Pupils are equal, round, and reactive to light.  Cardiovascular:     Rate and Rhythm: Normal rate and regular rhythm.  Pulmonary:     Effort: Pulmonary effort is normal.     Breath sounds: Normal breath sounds.  Abdominal:     General: Abdomen is flat.     Palpations: Abdomen is soft.  Musculoskeletal:        General: Normal range of motion.  Skin:    General: Skin is warm and dry.  Neurological:     General: No focal deficit present.     Mental Status: He is alert. Mental status is at baseline.  Psychiatric:        Attention and Perception: He is inattentive.        Mood and Affect: Affect is blunt.        Speech: He is noncommunicative.   Review of Systems  Musculoskeletal:  Positive for myalgias.  Blood pressure (!) 116/92, pulse 79, temperature 98.5 F (36.9 C), temperature source Oral, resp. rate 14, height 6' (1.829 m), weight 85.9 kg, SpO2 97 %. Body mass index is 25.68 kg/m.  Treatment Plan Summary: Plan patient is very sleepy today but did not seem to be exactly delirious.  Right now no agitation that would require any specific medicine.  Continue with the as needed orders in place if he becomes agitated at times or tries to leave or get out of bed.  Tried to provide some psychoeducation for the patient which I do not think he was listening to but the family appreciated.  We will continue to follow as needed.  No orders placed for anything for opiate withdrawal again.  Disposition: No evidence of imminent risk to self or others at present.    Mordecai Rasmussen, MD 12/02/2021 5:01 PM

## 2021-12-02 NOTE — Progress Notes (Signed)
[facility]  ROUNDING NOTE   Subjective:  Patient is a bit more arousable today. Still confused. CK down to be quite high as 50,000. Urine output 850 cc over the preceding 24 hours. Creatinine up to 4.03. Urine toxicology positive for cocaine. 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  Objective:  Vital signs in last 24 hours:  Temp:  [97.6 F (36.4 C)-98.5 F (36.9 C)] 97.6 F (36.4 C) (02/02 0400) Pulse Rate:  [81-94] 87 (02/02 0600) Resp:  [11-19] 13 (02/02 0600) BP: (127-157)/(80-110) 145/98 (02/02 0600) SpO2:  [96 %-100 %] 99 % (02/02 0600) Weight:  [81.2 kg-85.9 kg] 85.9 kg (02/02 0500)  Weight change: -6.2 kg Filed Weights   12/01/21 0549 12/01/21 0951 12/02/21 0500  Weight: 87.4 kg 81.2 kg 85.9 kg    Intake/Output: I/O last 3 completed shifts: In: 2841.1 [P.O.:840; I.V.:1001.1; IV Piggyback:1000] Out: 850 [Urine:850]   Intake/Output this shift:  Total I/O In: 2213.9 [I.V.:2038.9; IV Piggyback:175] Out: -   Physical Exam: General: No acute distress  Head: Normocephalic, atraumatic. Moist oral mucosal membranes  Eyes: Anicteric  Neck: Supple  Lungs:  Clear to auscultation, normal effort  Heart: S1S2 no rubs  Abdomen:  Soft, nontender, bowel sounds present  Extremities: Trace peripheral edema.  Neurologic: Arousable but confused at times.  Skin: Several lacerations noted on both lower extremities  Access: No hemodialysis access    Basic Metabolic Panel: Recent Labs  Lab 12/01/21 0607 12/01/21 1432 12/01/21 2147  NA 133* 131* 131*  K 6.6* 5.1 4.6  CL 96* 101 101  CO2 22 22 19*  GLUCOSE 210* 110* 97  BUN 46* 53* 59*  CREATININE 3.28* 3.53* 4.03*  CALCIUM 7.3* 6.8* 6.7*  MG 2.6*  --   --     Liver Function Tests: Recent Labs  Lab 12/01/21 0607  AST 1,510*  ALT 725*  ALKPHOS 105  BILITOT 0.3  PROT 8.6*  ALBUMIN 3.9   Recent Labs  Lab 12/01/21 0607  LIPASE 41   Recent Labs  Lab 12/01/21 0607  AMMONIA 21     CBC: Recent Labs  Lab 12/01/21 0607  WBC 21.4*  NEUTROABS 18.7*  HGB 15.9  HCT 47.3  MCV 92.2  PLT 329    Cardiac Enzymes: Recent Labs  Lab 12/01/21 0607  CKTOTAL >50,000*    BNP: Invalid input(s): POCBNP  CBG: Recent Labs  Lab 12/01/21 0946  GLUCAP 123*    Microbiology: Results for orders placed or performed during the hospital encounter of 12/01/21  Resp Panel by RT-PCR (Flu A&B, Covid) Nasopharyngeal Swab     Status: None   Collection Time: 12/01/21  8:16 AM   Specimen: Nasopharyngeal Swab; Nasopharyngeal(NP) swabs in vial transport medium  Result Value Ref Range Status   SARS Coronavirus 2 by RT PCR NEGATIVE NEGATIVE Final    Comment: (NOTE) SARS-CoV-2 target nucleic acids are NOT DETECTED.  The SARS-CoV-2 RNA is generally detectable in upper respiratory specimens during the acute phase of infection. The lowest concentration of SARS-CoV-2 viral copies this assay can detect is 138 copies/mL. A negative result does not preclude SARS-Cov-2 infection and should not be used as the sole basis for treatment or other patient management decisions. A negative result may occur with  improper specimen collection/handling, submission of specimen other than nasopharyngeal swab, presence of viral mutation(s) within the areas targeted by this assay, and inadequate number of viral copies(<138 copies/mL). A negative result must be combined with clinical observations, patient history, and epidemiological information.  The expected result is Negative.  Fact Sheet for Patients:  EntrepreneurPulse.com.au  Fact Sheet for Healthcare Providers:  IncredibleEmployment.be  This test is no t yet approved or cleared by the [place] FDA and  has been authorized for detection and/or diagnosis of SARS-CoV-2 by FDA under an Emergency Use Authorization (EUA). This EUA will remain  in effect (meaning this test can be used) for the duration of  the COVID-19 declaration under Section 564(b)(1) of the Act, 21 U.S.C.section 360bbb-3(b)(1), unless the authorization is terminated  or revoked sooner.       Influenza A by PCR NEGATIVE NEGATIVE Final   Influenza B by PCR NEGATIVE NEGATIVE Final    Comment: (NOTE) The Xpert Xpress SARS-CoV-2/FLU/RSV plus assay is intended as an aid in the diagnosis of influenza from Nasopharyngeal swab specimens and should not be used as a sole basis for treatment. Nasal washings and aspirates are unacceptable for Xpert Xpress SARS-CoV-2/FLU/RSV testing.  Fact Sheet for Patients: EntrepreneurPulse.com.au  Fact Sheet for Healthcare Providers: IncredibleEmployment.be  This test is not yet approved or cleared by the [place] FDA and has been authorized for detection and/or diagnosis of SARS-CoV-2 by FDA under an Emergency Use Authorization (EUA). This EUA will remain in effect (meaning this test can be used) for the duration of the COVID-19 declaration under Section 564(b)(1) of the Act, 21 U.S.C. section 360bbb-3(b)(1), unless the authorization is terminated or revoked.  Performed at [facility], [address] [place], [place] [address]   MRSA Next Gen by PCR, Nasal     Status: None   Collection Time: 12/01/21 10:00 AM   Specimen: Urine, Catheterized; Nasal Swab  Result Value Ref Range Status   MRSA by PCR Next Gen NOT DETECTED NOT DETECTED Final    Comment: (NOTE) The GeneXpert MRSA Assay (FDA approved for NASAL specimens only), is one component of a comprehensive MRSA colonization surveillance program. It is not intended to diagnose MRSA infection nor to guide or monitor treatment for MRSA infections. Test performance is not FDA approved in patients less than 74 years old. Performed at [facility], [address] [place], [place] [address]     Coagulation Studies: Recent Labs    12/01/21 0846  LABPROT 15.8*  INR  1.3*    Urinalysis: Recent Labs    12/01/21 1000  COLORURINE BROWN*  LABSPEC 1.016  PHURINE TEST NOT REPORTED DUE TO COLOR INTERFERENCE OF URINE PIGMENT  GLUCOSEU TEST NOT REPORTED DUE TO COLOR INTERFERENCE OF URINE PIGMENT*  HGBUR TEST NOT REPORTED DUE TO COLOR INTERFERENCE OF URINE PIGMENT*  BILIRUBINUR TEST NOT REPORTED DUE TO COLOR INTERFERENCE OF URINE PIGMENT*  KETONESUR TEST NOT REPORTED DUE TO COLOR INTERFERENCE OF URINE PIGMENT*  PROTEINUR TEST NOT REPORTED DUE TO COLOR INTERFERENCE OF URINE PIGMENT*  NITRITE TEST NOT REPORTED DUE TO COLOR INTERFERENCE OF URINE PIGMENT*  LEUKOCYTESUR TEST NOT REPORTED DUE TO COLOR INTERFERENCE OF URINE PIGMENT*      Imaging: CT HEAD WO CONTRAST (5MM)  Result Date: 12/01/2021 CLINICAL DATA:  Altered mental status EXAM: CT HEAD WITHOUT CONTRAST TECHNIQUE: Contiguous axial images were obtained from the base of the skull through the vertex without intravenous contrast. RADIATION DOSE REDUCTION: This exam was performed according to the departmental dose-optimization program which includes automated exposure control, adjustment of the mA and/or kV according to patient size and/or use of iterative reconstruction technique. COMPARISON:  CT head 12/07/2018 FINDINGS: Brain: There is no evidence of acute intracranial hemorrhage, extra-axial fluid collection, or acute  infarct. Parenchymal volume is normal. The ventricles are normal in size. Gray-white differentiation is preserved. There is no mass lesion.  There is no midline shift. Vascular: No hyperdense vessel or unexpected calcification. Skull: No calvarial fracture is seen. There is an age-indeterminate fracture of the anterior left maxillary sinus wall. Sinuses/Orbits: There is moderate mucosal thickening in the bilateral maxillary sinuses. Globes and orbits are unremarkable. Other: None. IMPRESSION: 1. No acute intracranial pathology. 2. Age-indeterminate fracture of the anterior left maxillary sinus wall.  Electronically Signed   By: Valetta Mole M.D.   On: 12/01/2021 08:11   CT FEMUR LEFT WO CONTRAST  Addendum Date: 12/01/2021   ADDENDUM REPORT: 12/01/2021 19:43 ADDENDUM: Discussed with PA Ouma over the telephone at 7:42 p.m. on 12/01/2021 with read back. Electronically Signed   By: Ofilia Neas M.D.   On: 12/01/2021 19:43   Result Date: 12/01/2021 CLINICAL DATA:  Cellulitis, swelling EXAM: CT OF THE LOWER LEFT EXTREMITY WITHOUT CONTRAST TECHNIQUE: Multidetector CT imaging of the lower left extremity was performed according to the standard protocol. RADIATION DOSE REDUCTION: This exam was performed according to the departmental dose-optimization program which includes automated exposure control, adjustment of the mA and/or kV according to patient size and/or use of iterative reconstruction technique. COMPARISON:  None. FINDINGS: Bones/Joint/Cartilage No acute fracture identified. No suspicious bony lesions. No bone destruction or periosteal reaction. Appears to be a small knee joint effusion and hip joint effusion. Ligaments Suboptimally assessed by CT. Muscles and Tendons Diffuse marked muscular edema and swelling visualized throughout the entire quadriceps muscle more prominent laterally, with accompanying perimuscular and intramuscular septal/fascial fluid/edema which is more prominent proximally. There is a smaller approximally 3.2 x 2.4 cm focal mild hypodense area identified in the mid biceps femoris muscle which may also represent edema/inflammation. Similar muscular edema and adjacent fat stranding of the muscles surrounding the left hip. No foci of air identified within the muscle or fascial planes. Soft tissues Mild subcutaneous fat stranding edema in the lateral aspect of the thigh. IMPRESSION: 1. Findings suggestive of extensive myositis involving the muscles about the left hip and most severely the quadriceps muscles, with accompanying fasciitis which is more prominent proximally. Limited  evaluation without contrast. No foci of muscular or fascial gas identified, however necrotizing fasciitis can not be excluded as gas is not always seen. Clinical correlation recommended. 2. Smaller more focal hypodensity in the mid biceps femoris muscle which may also represent a smaller area of edema/inflammation. 3. Small hip joint and knee joint effusions. Septic joints can not be excluded. Will contact and notify the ordering provider. Electronically Signed: By: Ofilia Neas M.D. On: 12/01/2021 19:30   DG Chest Port 1 View  Result Date: 12/01/2021 CLINICAL DATA:  A 36 year old male presents for evaluation of altered mental status. EXAM: PORTABLE CHEST 1 VIEW COMPARISON:  Comparison made with December of 2021. FINDINGS: EKG leads project over the upper chest. Trachea midline. Cardiomediastinal contours and hilar structures with suggestion of cardiac enlargement and with central pulmonary vascular engorgement. No lobar consolidation. No sign of pleural effusion. No visible pneumothorax. On limited assessment there is no acute skeletal process. IMPRESSION: Cardiomegaly with central pulmonary vascular engorgement. No consolidation or effusion. Electronically Signed   By: Zetta Bills M.D.   On: 12/01/2021 08:39   ECHOCARDIOGRAM COMPLETE  Result Date: 12/01/2021    ECHOCARDIOGRAM REPORT   Patient Name:   Edwin [name] Date of Exam: 12/01/2021 Medical Rec #:  [id number]      Height:  72.0 in Accession #:    [id number]     Weight:       179.0 lb Date of Birth:  1986-09-03     BSA:          2.033 m Patient Age:    19 years       BP:           141/94 mmHg Patient Gender: M              HR:           86 bpm. Exam Location:  [facility] Procedure: 2D Echo, Cardiac Doppler and Color Doppler Indications:     Elevated Troponin  History:         Patient has no prior history of Echocardiogram examinations. No                  past medical history on file.  Sonographer:     Sherrie Sport Referring Phys:  [id number] Bradly Bienenstock Diagnosing Phys: Kate Sable MD IMPRESSIONS  1. Left ventricular ejection fraction, by estimation, is 55 to 60%. The left ventricle has normal function. The left ventricle has no regional wall motion abnormalities. Left ventricular diastolic parameters were normal.  2. Right ventricular systolic function is low normal. The right ventricular size is normal.  3. The mitral valve is normal in structure. No evidence of mitral valve regurgitation.  4. The aortic valve is tricuspid. Aortic valve regurgitation is not visualized. FINDINGS  Left Ventricle: Left ventricular ejection fraction, by estimation, is 55 to 60%. The left ventricle has normal function. The left ventricle has no regional wall motion abnormalities. The left ventricular internal cavity size was normal in size. There is  no left ventricular hypertrophy. Left ventricular diastolic parameters were normal. Right Ventricle: The right ventricular size is normal. No increase in right ventricular wall thickness. Right ventricular systolic function is low normal. Left Atrium: Left atrial size was normal in size. Right Atrium: Right atrial size was normal in size. Pericardium: There is no evidence of pericardial effusion. Mitral Valve: The mitral valve is normal in structure. No evidence of mitral valve regurgitation. MV peak gradient, 3.2 mmHg. The mean mitral valve gradient is 1.0 mmHg. Tricuspid Valve: The tricuspid valve is normal in structure. Tricuspid valve regurgitation is not demonstrated. Aortic Valve: The aortic valve is tricuspid. Aortic valve regurgitation is not visualized. Aortic valve mean gradient measures 2.0 mmHg. Aortic valve peak gradient measures 3.7 mmHg. Aortic valve area, by VTI measures 2.77 cm. Pulmonic Valve: The pulmonic valve was normal in structure. Pulmonic valve regurgitation is not visualized. Aorta: The aortic root is normal in size and structure. Venous: The inferior vena cava was not well visualized. IAS/Shunts:  No atrial level shunt detected by color flow Doppler.  LEFT VENTRICLE PLAX 2D LVIDd:         4.50 cm   Diastology LVIDs:         2.70 cm   LV e' medial:    13.10 cm/s LV PW:         1.00 cm   LV E/e' medial:  6.2 LV IVS:        1.10 cm   LV e' lateral:   14.10 cm/s LVOT diam:     2.00 cm   LV E/e' lateral: 5.7 LV SV:         42 LV SV Index:   21 LVOT Area:     3.14 cm  RIGHT VENTRICLE RV  Basal diam:  4.10 cm RV S prime:     10.70 cm/s TAPSE (M-mode): 2.0 cm LEFT ATRIUM             Index        RIGHT ATRIUM           Index LA diam:        3.60 cm 1.77 cm/m   RA Area:     20.90 cm LA Vol (A2C):   54.9 ml 26.99 ml/m  RA Volume:   69.80 ml  34.34 ml/m LA Vol (A4C):   24.8 ml 12.20 ml/m LA Biplane Vol: 44.0 ml 21.65 ml/m  AORTIC VALVE                    PULMONIC VALVE AV Area (Vmax):    2.62 cm     PV Vmax:        0.76 m/s AV Area (Vmean):   2.64 cm     PV Vmean:       44.700 cm/s AV Area (VTI):     2.77 cm     PV VTI:         0.106 m AV Vmax:           96.15 cm/s   PV Peak grad:   2.3 mmHg AV Vmean:          69.200 cm/s  PV Mean grad:   1.0 mmHg AV VTI:            0.152 m      RVOT Peak grad: 3 mmHg AV Peak Grad:      3.7 mmHg AV Mean Grad:      2.0 mmHg LVOT Vmax:         80.30 cm/s LVOT Vmean:        58.200 cm/s LVOT VTI:          0.134 m LVOT/AV VTI ratio: 0.88  AORTA Ao Root diam: 3.45 cm MITRAL VALVE               TRICUSPID VALVE MV Area (PHT): 4.96 cm    TR Peak grad:   11.8 mmHg MV Area VTI:   2.43 cm    TR Vmax:        172.00 cm/s MV Peak grad:  3.2 mmHg MV Mean grad:  1.0 mmHg    SHUNTS MV Vmax:       0.89 m/s    Systemic VTI:  0.13 m MV Vmean:      54.8 cm/s   Systemic Diam: 2.00 cm MV Decel Time: 153 msec    Pulmonic VTI:  0.135 m MV E velocity: 81.00 cm/s MV A velocity: 51.40 cm/s MV E/A ratio:  1.58 Kate Sable MD Electronically signed by Kate Sable MD Signature Date/Time: 12/01/2021/3:14:50 PM    Final    US Abdomen Limited RUQ (LIVER/GB)  Result Date: 12/01/2021 CLINICAL DATA:   Elevated LFTs EXAM: ULTRASOUND ABDOMEN LIMITED RIGHT UPPER QUADRANT COMPARISON:  None. FINDINGS: Gallbladder: No gallstones or wall thickening visualized. No sonographic Murphy sign noted by sonographer. Common bile duct: Diameter: 2 mm Liver: No focal lesion identified. Within normal limits in parenchymal echogenicity. Portal vein is patent on color Doppler imaging with normal direction of blood flow towards the liver. Other: None. IMPRESSION: Normal right upper quadrant ultrasound. Electronically Signed   By: Maurine Simmering M.D.   On: 12/01/2021 09:14     Medications:    sodium chloride 150 mL/hr at  12/02/21 0733   clindamycin (CLEOCIN) IV Stopped (12/02/21 0601)   heparin 1,250 Units/hr (12/02/21 0733)   piperacillin-tazobactam (ZOSYN)  IV 12.5 mL/hr at 12/02/21 [id number]    Chlorhexidine Gluconate Cloth  6 each Topical Daily   isosorbide mononitrate  15 mg Oral Daily   pantoprazole (PROTONIX) IV  40 mg Intravenous Q24H   sodium zirconium cyclosilicate  10 g Oral TID   vancomycin variable dose per unstable renal function (pharmacist dosing)   Does not apply See admin instructions   diazepam, docusate sodium, fentaNYL (SUBLIMAZE) injection, haloperidol lactate, polyethylene glycol  Assessment/ Plan:  36 y.o. male  with no known past medical history, who was admitted to [facility] on 12/01/2021 for evaluation of altered mental status.  Urine drug screen positive for cocaine.   1.  Acute kidney injury, likely secondary to rhabdomyolysis. 2.  Hyperkalemia serum potassium 6.6 upon admission. 3.  Mild hyponatremia. 4.  Rhabdomyolysis.  Plan:  Patient does have quite significant acute kidney injury in the setting of rhabdomyolysis.  Maintain the patient on 0.9 normal saline but increase the rate to 175 cc/h for now.  No immediate need for dialysis as the patient is producing some urine.  Discontinue sodium zirconium now given improvement of hyperkalemia.  We were considering starting bicarbonate but will hold  off given relatively low serum calcium.  Further plan as patient progresses.   LOS: 1 Edwin [name] 2/2/20237:46 AM

## 2021-12-02 NOTE — Consult Note (Signed)
[facility] for Electrolyte Monitoring and Replacement   Recent Labs: Potassium (mmol/L)  Date Value  12/02/2021 4.3   Magnesium (mg/dL)  Date Value  12/01/2021 2.6 (H)   Calcium (mg/dL)  Date Value  12/02/2021 6.9 (L)   Albumin (g/dL)  Date Value  12/02/2021 2.5 (L)   Sodium (mmol/L)  Date Value  12/02/2021 132 (L)   Assessment: 36 year old male with past medical history of polysubstance abuse who presented with acute metabolic encephalopathy possibly due to drug overdose UDS + for benzodiazepine, cocaine. Pharmacy consulted for electrolyte monitoring and replacement.   Goal of Therapy:  Electrolytes within normal limits  Plan:  --Hyperkalemia resolved, lokelma discontinued --Mild hyponatremia likely due to AKI and rhabdomyolysis, will continue to monitor, nephrology following --Hypocalcemia likely due to rhabdomyolysis --Follow up with AM labs tomorrow  Edwin [name] 12/02/2021 1:58 PM

## 2021-12-02 NOTE — Consult Note (Signed)
ANTICOAGULATION CONSULT NOTE - Follow Up Consult  Pharmacy Consult for heparin Indication: ACS Troponin [phone], cardiology recommended heparin x48 h, no current plans for invasive work-up  No Known Allergies  Patient Measurements: Height: 6' (182.9 cm) Weight: 81.2 kg (179 lb 0.2 oz) IBW/kg (Calculated) : 77.6 Heparin Dosing Weight: 81.2 kg  Labs: Recent Labs    12/01/21 0607 12/01/21 0846 12/01/21 1140 12/01/21 1432 12/01/21 1756 12/01/21 2147  HGB 15.9  --   --   --   --   --   HCT 47.3  --   --   --   --   --   PLT 329  --   --   --   --   --   APTT  --  32  --   --   --   --   LABPROT  --  15.8*  --   --   --   --   INR  --  1.3*  --   --   --   --   HEPARINUNFRC  --   --   --   --  <0.10*  --   CREATININE 3.28*  --   --  3.53*  --  4.03*  CKTOTAL >50,000*  --   --   --   --   --   TROPONINIHS 1,334*  --  2,411* 2,444* 1,794*  --      Estimated Creatinine Clearance: 27.8 mL/min (A) (by C-G formula based on SCr of 4.03 mg/dL (H)).  Assessment: 36 year old male with past medical history of polysubstance abuse who presented with acute metabolic encephalopathy possibly due to drug overdose UDS + for benzodiazepine, cocaine. Pharmacy has been consulted for heparin monitoring and dosing.   Date    Time     aPTT/HL      Rate/Comment 0202    0855     <0.1             1250 u/hr; subthera  Goal of Therapy:  Heparin level 0.3-0.7 units/ml Monitor platelets by anticoagulation protocol: Yes  Baseline Labs: INR 1.3 aPTT 32   Plan:  --Increase heparin rate to 1550 u/hr  --Check anti-Xa level in 8 hours and daily once consecutively therapeutic while on heparin --48 h heparin stop date per cardiology --Continue to monitor H&H and platelets per protocol  [facility] 12/02/2021,7:09 AM

## 2021-12-02 NOTE — Progress Notes (Signed)
NAME:  Edwin [name], MRN:  [id number], DOB:  April 08, 1986, LOS: 1 ADMISSION DATE:  12/01/2021, CONSULTATION DATE:  12/01/21 REFERRING MD:  Dr. Karma Greaser, CHIEF COMPLAINT:  Altered mental status   Brief Pt Description / Synopsis:  36 year old male with past medical history significant for polysubstance abuse admitted for acute metabolic encephalopathy in the setting of multiple severe metabolic derangements (AKI with Hyperkalemia, Rhabdomyolysis, liver failure) plus cocaine use.  Also found to have elevated troponin, Myositis of the left hip and left quadriceps muscle,  and concern for septic left hip and knee joints. Nephrology, Cardiology, ID, and Orthopedics following.  History of Present Illness:  Edwin [name] is a 36 year old male with no significant past medical history other than drug abuse, who presented to [facility] ED on 12/01/2021 due to altered mental status.  Patient is currently altered and no family is available, therefore history is obtained from chart review.  Per notes, EMS was dispatched as the patient woke up agitated and pulling out handfuls of his hair and thrashing around.  Upon EMS arrival he was disabled, foul-smelling having recently urinated on himself, and agitated.    Upon arrival to the ED he is able to focus on people in the room and give thumbs up, but does not answer questions or follow commands.  The only thing he would say is let me leave here.  He was picking off EKG leads and attempting to remove his IV.  ED Course: Initial vital signs: Respiratory rate 12, pulse 91, blood pressure 127/110, SPO2 96% on room air Significant labs: Sodium 133, potassium 6.6, chloride 96, bicarbonate 22, BUN 46, creatinine 3.28, glucose 210, alkaline phosphatase 105, AST 1510, ALT [id number], Tylenol 19, salicylates less than 7, ethyl alcohol less than 10, WBC 21.4 with neutrophilia COVID-19 PCR is pending Urinalysis pending UDS + cocaine Imaging: CT head: IMPRESSION: 1. No acute intracranial  pathology. 2. Age-indeterminate fracture of the anterior left maxillary sinus wall. Chest x-ray: Cardiomediastinal contours and hilar structures with suggestion of cardiac enlargement and with central pulmonary vascular engorgement. No lobar consolidation. No sign of pleural effusion. No visible pneumothorax. Right upper quadrant ultrasound: IMPRESSION: Normal right upper quadrant ultrasound. Medications given: 1 amp sodium bicarbonate, albuterol, calcium gluconate, 2 L normal saline, Lokelma, 1 mg Versed  ED provider Dr. Karma Greaser spoke with Dr. Zollie Scale of nephrology, agreed with temporizing measures as above.  PCCM is asked admit the patient to ICU for further work-up and treatment of acute metabolic encephalopathy in the setting of multiple severe metabolic derangements and suspected drug abuse.  Pertinent  Medical History  Polysubstance abuse  Micro Data:  2/1: SARS-CoV-2 & Influenza PCR>>negative 2/1: Blood culture x2>> 2/1: Urine>> 2/2 Left hip synovial Cx >>  Antimicrobials:  Clindamycin 2/1>> Vancomycin 2/1>> Zosyn 2/1>>  Significant Hospital Events: Including procedures, antibiotic start and stop dates in addition to other pertinent events   2/1: PCCM asked to admit.  Nephrology & Cardiology consulted 2/1: Pt became more awake later in shift, complaining of Left leg and hip pain.  CT femur concerning for extensive myositis, along with small left hip joint & left knee effusions.  General Surgery evaluated, findings not suggestive of necrotizing fascitis. Broad spectrum ABX initiated after discussion with ID (Clindamycin, Vanc, Zosyn) 2/2:  Ortho consulted. Plan for MRI of left leg and IR guided aspiration of left hip. Pt now complaining of right hip pain, will MRI right hip as well.  Worsening renal function  Interim History / Subjective:  Last night -Yesterday afternoon/evening,  pt became more alert and complained of left hip/leg pain ~ found to have progressive erythema  over left quadriceps -CT Femur was concerning for extensive myositis and small left hip joint and left knee effusions (unable to exclude septic joints) -General Surgery evaluated pt excluded necrotizing fasciitis ~ no surgical needed -ID consulted ~ started on Clindamycin, Vancomycin, Zosyn This morning -Ortho consulted this morning, requests IR perform aspiration of left hip -MRI left leg ordered ~ pt also complaining of right leg pain and limited mobility, will obtain MRI right leg as well while in MRI -Complaints of left shoulder pain ~ Xray obtained>> nonspecific left shoulder soft tissue swelling, NO acute osseous abnormality -Pt is awake and alert, oriented to self and place (has no recollection of events leading to hospitalization, just states "I made some bad choices") -Afebrile, hemodynamically stable, NO vasopressors, on room air -Does complain of pain to bilateral legs and left shoulder -Worsening Creatinine up to 4.03, UOP 850 cc past 24 hours  Objective   Blood pressure (!) 145/98, pulse 87, temperature 97.6 F (36.4 C), temperature source Oral, resp. rate 13, height 6' (1.829 m), weight 85.9 kg, SpO2 99 %.        Intake/Output Summary (Last 24 hours) at 12/02/2021 0740 Last data filed at 12/02/2021 [id number] Gross per 24 hour  Intake 5054.94 ml  Output 850 ml  Net 4204.94 ml   Filed Weights   12/01/21 0549 12/01/21 0951 12/02/21 0500  Weight: 87.4 kg 81.2 kg 85.9 kg    Examination: General: Acutely ill-appearing male, laying in bed, more alert today, but in no acute distress, on room air HENT: Atraumatic, normocephalic, neck supple, no JVD Lungs: Clear breath sounds bilaterally, even, nonlabored, no accessory muscle use Cardiovascular: Regular rate and rhythm, S1-S2, no murmurs, rubs, gallops Abdomen: Soft, nontender, nondistended, no guarding rebound tenderness, bowel sounds positive x4 Extremities: Normal bulk and tone, no deformities, no edema, 2+ distal pulses Neuro:  Sleeping, arouses easily to voice, oriented to person and place, follows commands, no focal deficits, pupils PERRL GU: Foley catheter in place Skin:  Erythema to left thigh and left shoulder.  See images below.       Resolved Hospital Problem list     Assessment & Plan:   Acute Metabolic Encephalopathy in setting of multiple metabolic derangements & cocaine use ~ IMPROVING -Provide supportive care -Treat metabolic derangements as below -CT Head negative for acute intracranial abnormality -UDS + for cocaine and Benzodiazepines -Serum tylenol, salicylates, & Ethyl alcohol all negative  Acute Kidney Injury Rhabdomyolysis Severe Hyperkalemia ~ RESOLVED Hyponatremia -Monitor I&O's / urinary output -Follow BMP -Ensure adequate renal perfusion -Avoid nephrotoxic agents as able -Replace electrolytes as indicated -IV fluids (NS increased to 175 cc/hr on 2/2) -Nephrology following, appreciate input -Trend CK  Elevated Troponin, demand ischemia vs NSTEMI in setting of Cocaine abuse Prolonged QTc, suspect due to Hyperkalemia +/- suspected drug abuse -Continuous cardiac monitoring -Serial EKG's -Maintain MAP >65 -IV fluids -Vasopressors as needed to maintain MAP goal -Lactic acid has normalized -HS Troponin peaked at 2444 -Echocardiogram normal: LVEF 55-60% -Cardiology following, appreciate input ~ recommends Heparin gtt for 48 hrs (NO bolus)  Elevated LFT's HCV Ab is Reactive -Trend LFT's and bleeding times -Acute Hepatitis panel ~ HCV Ab is reactive, viral load pending -RUQ [place] normal -ID consulted  Leukocytosis in setting of Severe Left thigh myositis with accompanying proximal fasciitis  & Left hip and Knee Joint Effusions concerning for Septic joints -Monitor fever curve -Trend WBC's &  Procalcitonin -Follow cultures as above -ID is consulted, appreciate input -ABX as per ID ~ currently on Clindamycin, Vancomycin, & Zosyn -Orthopedics consulted, appreciate  input -Plan for MRI bilateral legs and IR guided aspiration of left hip  Sudden onset Bilateral hearing loss following drug use ~ IMPROVING UDS+Cocaine -Likely drug induced (?speed-balling) in a patient with significant history of IV drug use. -ENT consult if appropriate ~ Discussed with Dr. Mortimer Fries 2/2, will hold off on ENT consult as hearing loss seems to be slowly improving    Best Practice (right click and "Reselect all SmartList Selections" daily)   Diet/type: regular DVT prophylaxis: Heparin gtt GI prophylaxis: PPI Lines: N/A Foley:  yes and is still needed Code Status:  full code Last date of multidisciplinary goals of care discussion [12/02/21]  Labs   CBC: Recent Labs  Lab 12/01/21 0607  WBC 21.4*  NEUTROABS 18.7*  HGB 15.9  HCT 47.3  MCV 92.2  PLT 329     Basic Metabolic Panel: Recent Labs  Lab 12/01/21 0607 12/01/21 1432 12/01/21 2147  NA 133* 131* 131*  K 6.6* 5.1 4.6  CL 96* 101 101  CO2 22 22 19*  GLUCOSE 210* 110* 97  BUN 46* 53* 59*  CREATININE 3.28* 3.53* 4.03*  CALCIUM 7.3* 6.8* 6.7*  MG 2.6*  --   --     GFR: Estimated Creatinine Clearance: 27.8 mL/min (A) (by C-G formula based on SCr of 4.03 mg/dL (H)). Recent Labs  Lab 12/01/21 0607 12/01/21 0816 12/01/21 1014 12/01/21 1432 12/01/21 2147 12/02/21 0006  PROCALCITON 9.18  --   --   --  12.50  --   WBC 21.4*  --   --   --   --   --   LATICACIDVEN  --    < > 2.4* 2.4* 2.2* 1.6   < > = values in this interval not displayed.     Liver Function Tests: Recent Labs  Lab 12/01/21 0607  AST 1,510*  ALT 725*  ALKPHOS 105  BILITOT 0.3  PROT 8.6*  ALBUMIN 3.9    Recent Labs  Lab 12/01/21 0607  LIPASE 41    Recent Labs  Lab 12/01/21 0607  AMMONIA 21     ABG    Component Value Date/Time   PHART 7.31 (L) 12/01/2021 0713   PCO2ART 43 12/01/2021 0713   PO2ART 77 (L) 12/01/2021 0713   HCO3 21.7 12/01/2021 0713   ACIDBASEDEF 4.5 (H) 12/01/2021 0713   O2SAT 93.9 12/01/2021  0713      Coagulation Profile: Recent Labs  Lab 12/01/21 0846  INR 1.3*    Cardiac Enzymes: Recent Labs  Lab 12/01/21 0607  CKTOTAL >50,000*    HbA1C: No results found for: HGBA1C  CBG: Recent Labs  Lab 12/01/21 0946  GLUCAP 123*    Review of Systems:   Positives in BOLD: Gen: Denies fever, chills, weight change, fatigue, night sweats HEENT: Denies blurred vision, double vision, hearing loss, tinnitus, sinus congestion, rhinorrhea, sore throat, neck stiffness, dysphagia PULM: Denies shortness of breath, cough, sputum production, hemoptysis, wheezing CV: Denies chest pain, edema, orthopnea, paroxysmal nocturnal dyspnea, palpitations GI: Denies abdominal pain, nausea, vomiting, diarrhea, hematochezia, melena, constipation, change in bowel habits GU: Denies dysuria, hematuria, polyuria, oliguria, urethral discharge Endocrine: Denies hot or cold intolerance, polyuria, polyphagia or appetite change Derm: Denies rash, dry skin, scaling or peeling skin change Heme: Denies easy bruising, bleeding, bleeding gums Neuro: Denies headache, numbness, weakness, slurred speech, loss of memory or consciousness Musculoskeletal:  Pain to bilateral legs and left shoulder   Past Medical History:  He,  has no past medical history on file.   Surgical History:     Social History:      Family History:  His family history is not on file.   Allergies No Known Allergies   Home Medications  Prior to Admission medications   Not on File     Critical care time: 40 minutes     Darel Hong, AGACNP-BC [facility] epic messenger for cross cover needs If after hours, please call E-link

## 2021-12-02 NOTE — Progress Notes (Signed)
Pharmacy Antibiotic Note  Edwin [name] is a 36 y.o. male admitted on 12/01/2021 with altered mental status. Found to have sepsis due to necrotizing fascitis. Pharmacy has been consulted for vancomycin and piperacillin/tazobactam dosing.  WBC elevated but trending down, VSS stable. IN AKI with rhabdomyolysis with Scr of 4.76 mg/dL (trending up) Unclear baseline renal function.  She was administered a 2000 mg dose of IV vancomycin on 12/01/21 at 2259. A random level was drawn showing the following:   vancomycin level 12/02/21 1951: 23 mcg/mL  Plan:   1) vancomycin level is above therapeutic range of 10 - 15 mcg/mL Hold off on further vancomycin maintenance doses due to concurrent AKI.  Follow up Scr in the AM to determine timing of future doses  2) continue Piperacillin/tazobactam 3.375 grams every 8 hours (4 hour infusion)  Also on clindamycin 600 mg every 8 hours  Height: 6' (182.9 cm) Weight: 85.9 kg (189 lb 6 oz) IBW/kg (Calculated) : 77.6  Temp (24hrs), Avg:98 F (36.7 C), Min:97.6 F (36.4 C), Max:98.5 F (36.9 C)  Recent Labs  Lab 12/01/21 0607 12/01/21 0816 12/01/21 1014 12/01/21 1432 12/01/21 2147 12/02/21 0006 12/02/21 0855 12/02/21 1951  WBC 21.4*  --   --   --   --   --  18.3*  --   CREATININE 3.28*  --   --  3.53* 4.03*  --  4.76*  --   LATICACIDVEN  --  3.2* 2.4* 2.4* 2.2* 1.6  --   --   VANCORANDOM  --   --   --   --   --   --   --  23     Estimated Creatinine Clearance: 23.5 mL/min (A) (by C-G formula based on SCr of 4.76 mg/dL (H)).    No Known Allergies  Antimicrobials this admission: 12/01/21 vancomycin >>  12/01/21 cefepime  >>  12/01/21 clindamycin >>  Microbiology results: 2/1 BCx: in process 2/1 UCx: in process 2/2 synovial fluid Cx: pending  Thank you for allowing pharmacy to be a part of this patients care.  Dallie Piles, PharmD 12/02/2021 8:52 PM

## 2021-12-02 NOTE — Progress Notes (Addendum)
Brief Pt Description:  36 year old male with past medical history significant for polysubstance abuse admitted for acute metabolic encephalopathy in the setting of multiple severe metabolic derangements (AKI with Hyperkalemia, Rhabdomyolysis, liver failure) plus cocaine use.  Also found to have elevated troponin.  Nephrology and Cardiology following.  Subjective: Notified by radiology of abnormal Left femur CT findings as below. Patient also with sudden bilateral hearing loss on admission. Per ED notes , patient's friends reported that  he "does lots of drugs".  Objective: On bedside assessment, he was afebrile with blood pressure 138/87 mm Hg and pulse rate 88 beats/min, sats 100% on RA. There were no focal neurological deficits; he was alert but with significant hearing and c/o left thigh pain.  Diagnostics: 2/1: CT Left Femur>1. Findings suggestive of extensive myositis involving the muscles about the left hip and most severely the quadriceps muscles, with accompanying fasciitis which is more prominent proximally. Limited evaluation without contrast. No foci of muscular or fascial gas identified, however necrotizing fasciitis can not be excluded as gas is not always seen. Clinical correlation recommended. 2. Smaller more focal hypodensity in the mid biceps femoris muscle which may also represent a smaller area of edema/inflammation. 3. Small hip joint and knee joint effusions. Septic joints can not be excluded.  Plan Severe Left thigh myositis with accompanying proximal fasciitis  Left hip and Knee Joint Effusions concerning for Septic joints -Discussed above findings with on-call general surgery Dr. Christian Mate who evaluated patient at the bedside.  Per Dr. Milas Gain findings not suggestive of necrotizing fasciitis most likely myositis but will continue to monitor.  If any changes or worsening clinical symptoms then surgery will need to be involved again. -Discussed with ID as well due to  concerns for possible necrotizing fasciitis and septic joints.  Per ID would recommend broadly covering with IV vancomycin, Zosyn and clindamycin pending cultures. -General surgery and ID input appreciated.  Sudden onset Bilateral hearing loss following drug use UDS+Cocaine -Likely drug induced (?speed-balling) in a patient with significant history of IV drug use. -ENT consult if appropriate      Rufina Falco, DNP, CCRN, FNP-C, AGACNP-BC Acute Care Nurse Practitioner  [facility] Pager: [phone] [place] [facility]

## 2021-12-03 DIAGNOSIS — B179 Acute viral hepatitis, unspecified: Secondary | ICD-10-CM

## 2021-12-03 DIAGNOSIS — M6282 Rhabdomyolysis: Secondary | ICD-10-CM | POA: Diagnosis not present

## 2021-12-03 DIAGNOSIS — G9341 Metabolic encephalopathy: Secondary | ICD-10-CM | POA: Diagnosis not present

## 2021-12-03 DIAGNOSIS — R7989 Other specified abnormal findings of blood chemistry: Secondary | ICD-10-CM

## 2021-12-03 DIAGNOSIS — B999 Unspecified infectious disease: Secondary | ICD-10-CM

## 2021-12-03 DIAGNOSIS — F1410 Cocaine abuse, uncomplicated: Secondary | ICD-10-CM | POA: Diagnosis not present

## 2021-12-03 DIAGNOSIS — N179 Acute kidney failure, unspecified: Secondary | ICD-10-CM

## 2021-12-03 DIAGNOSIS — M60005 Infective myositis, unspecified leg: Secondary | ICD-10-CM

## 2021-12-03 LAB — CK: Total CK: 11555 U/L — ABNORMAL HIGH (ref 49–397)

## 2021-12-03 LAB — URINE CULTURE: Culture: NO GROWTH

## 2021-12-03 LAB — COMPREHENSIVE METABOLIC PANEL
ALT: 648 U/L — ABNORMAL HIGH (ref 0–44)
AST: 456 U/L — ABNORMAL HIGH (ref 15–41)
Albumin: 2.2 g/dL — ABNORMAL LOW (ref 3.5–5.0)
Alkaline Phosphatase: 63 U/L (ref 38–126)
Anion gap: 10 (ref 5–15)
BUN: 76 mg/dL — ABNORMAL HIGH (ref 6–20)
CO2: 17 mmol/L — ABNORMAL LOW (ref 22–32)
Calcium: 6.9 mg/dL — ABNORMAL LOW (ref 8.9–10.3)
Chloride: 105 mmol/L (ref 98–111)
Creatinine, Ser: 6.1 mg/dL — ABNORMAL HIGH (ref 0.61–1.24)
GFR, Estimated: 11 mL/min — ABNORMAL LOW (ref >60)
Glucose, Bld: 110 mg/dL — ABNORMAL HIGH (ref 70–99)
Potassium: 4.5 mmol/L (ref 3.5–5.1)
Sodium: 132 mmol/L — ABNORMAL LOW (ref 135–145)
Total Bilirubin: 0.5 mg/dL (ref 0.3–1.2)
Total Protein: 5.3 g/dL — ABNORMAL LOW (ref 6.5–8.1)

## 2021-12-03 LAB — CBC
HCT: 34.8 % — ABNORMAL LOW (ref 39.0–52.0)
Hemoglobin: 12.4 g/dL — ABNORMAL LOW (ref 13.0–17.0)
MCH: 31.9 pg (ref 26.0–34.0)
MCHC: 35.6 g/dL (ref 30.0–36.0)
MCV: 89.5 fL (ref 80.0–100.0)
Platelets: 205 10*3/uL (ref 150–400)
RBC: 3.89 MIL/uL — ABNORMAL LOW (ref 4.22–5.81)
RDW: 12.4 % (ref 11.5–15.5)
WBC: 13.1 10*3/uL — ABNORMAL HIGH (ref 4.0–10.5)
nRBC: 0 % (ref 0.0–0.2)

## 2021-12-03 LAB — PHOSPHORUS: Phosphorus: 6.3 mg/dL — ABNORMAL HIGH (ref 2.5–4.6)

## 2021-12-03 LAB — HEPARIN LEVEL (UNFRACTIONATED): Heparin Unfractionated: 0.19 IU/mL — ABNORMAL LOW (ref 0.30–0.70)

## 2021-12-03 LAB — PROCALCITONIN: Procalcitonin: 6.85 ng/mL

## 2021-12-03 LAB — MAGNESIUM: Magnesium: 2 mg/dL (ref 1.7–2.4)

## 2021-12-03 MED ORDER — HEPARIN SODIUM (PORCINE) 5000 UNIT/ML IJ SOLN
5000.0000 [IU] | Freq: Three times a day (TID) | INTRAMUSCULAR | Status: DC
  Administered 2021-12-03 – 2021-12-09 (×18): 5000 [IU] via SUBCUTANEOUS
  Filled 2021-12-03 (×17): qty 1

## 2021-12-03 MED ORDER — PANTOPRAZOLE SODIUM 40 MG PO TBEC
40.0000 mg | DELAYED_RELEASE_TABLET | Freq: Every day | ORAL | Status: DC
  Administered 2021-12-04 – 2021-12-09 (×6): 40 mg via ORAL
  Filled 2021-12-03 (×6): qty 1

## 2021-12-03 MED ORDER — HYDROMORPHONE HCL 2 MG PO TABS
1.0000 mg | ORAL_TABLET | ORAL | Status: DC | PRN
  Administered 2021-12-03 – 2021-12-04 (×6): 1 mg via ORAL
  Filled 2021-12-03 (×6): qty 1

## 2021-12-03 MED ORDER — PIPERACILLIN-TAZOBACTAM 3.375 G IVPB
3.3750 g | Freq: Two times a day (BID) | INTRAVENOUS | Status: DC
  Administered 2021-12-03: 23:00:00 3.375 g via INTRAVENOUS
  Filled 2021-12-03: qty 50

## 2021-12-03 MED ORDER — ONDANSETRON HCL 4 MG/2ML IJ SOLN
4.0000 mg | Freq: Four times a day (QID) | INTRAMUSCULAR | Status: AC | PRN
  Administered 2021-12-03 – 2021-12-06 (×2): 4 mg via INTRAVENOUS
  Filled 2021-12-03 (×2): qty 2

## 2021-12-03 MED ORDER — LACTATED RINGERS IV SOLN: INTRAVENOUS | Status: DC

## 2021-12-03 NOTE — Consult Note (Signed)
PHARMACY CONSULT NOTE  Pharmacy Consult for Electrolyte Monitoring and Replacement   Recent Labs: Potassium (mmol/L)  Date Value  12/03/2021 4.5   Magnesium (mg/dL)  Date Value  94/50/3888 2.0   Calcium (mg/dL)  Date Value  28/00/3491 6.9 (L)   Albumin (g/dL)  Date Value  79/15/0569 2.2 (L)   Phosphorus (mg/dL)  Date Value  79/48/0165 6.3 (H)   Sodium (mmol/L)  Date Value  12/03/2021 132 (L)   Assessment: 36 year old male with past medical history of polysubstance abuse who presented with acute metabolic encephalopathy possibly due to drug overdose UDS + for benzodiazepine, cocaine. Pharmacy consulted for electrolyte monitoring and replacement.   MIVF:  NS 175 mL/hr  Goal of Therapy:  Electrolytes within normal limits  Plan:  --Mild hyponatremia monitor for fluid overload (+7.5 L)  --Hypocalcemia likely due to rhabdomyolysis --Follow up with AM labs tomorrow  Elvia Collum 12/03/2021 7:17 AM

## 2021-12-03 NOTE — Progress Notes (Signed)
PROGRESS NOTE  Edwin [name]    DOB: Mar 08, 1986, 36 y.o.  [id number]  PCP: Pcp, No   Code Status: Full Code   DOA: 12/01/2021   LOS: 2  Brief Narrative of Current Hospitalization  Edwin [name] is a 36 y.o. male with a PMH significant for polysubstance abuse. They presented from home to the ED on 12/01/2021 with AMS.  In the ED, it was found that they had acute metabolic encephalopathy in the setting of multiple severe metabolic derangements (AKI with Hyperkalemia, Rhabdomyolysis, liver failure) plus cocaine use.  Also found to have elevated troponin. Originally admitted to [facility]. Nephrology, ortho surgery, general surgery and Cardiology following.  12/03/21 -[facility] assumed care  Assessment & Plan  Principal Problem:   Acute metabolic encephalopathy Active Problems:   Non-ST elevation (NSTEMI) myocardial infarction (HCC)   Cocaine abuse (HCC)   Cocaine poisoning (HCC)   Rhabdomyolysis   ATN (acute tubular necrosis) (HCC)   Hyperkalemia   Myositis  Severe Left thigh myositis with accompanying proximal fasciitis  Left hip and Knee Joint Effusions concerning for Septic joints - ID following - discontinued vancomycin - continue zosyn - follow cultures -ortho surgery following  -  s/p joint aspiration not consistent with septic joint - analgesia PRN  Sudden onset Bilateral hearing loss following drug use UDS+Cocaine -Likely drug induced (?speed-balling) in a patient with significant history of IV drug use. -ENT consult if appropriate  Acute Metabolic Encephalopathy in setting of multiple metabolic derangements & cocaine use ~ IMPROVING -Provide supportive care -Treat metabolic derangements as below -CT Head negative for acute intracranial abnormality -UDS + for cocaine and Benzodiazepines -Serum tylenol, salicylates, & Ethyl alcohol all negative - psychiatry consulted   Acute Kidney Injury- Cr 3.28>>4.76>6.10 today. Potentially due to antibiotic coverage.  Rhabdomyolysis-  CK from >50,00 to 11,555. Will continue to follow as kidney function has continued to worsen Severe Hyperkalemia ~ RESOLVED Hyponatremia- stable at 132 -Monitor I&O's / urinary output -Follow BMP -Ensure adequate renal perfusion -Avoid nephrotoxic agents as able -Replace electrolytes as indicated -IV fluids (NS increased to 175 cc/hr on 2/2) -Nephrology following, appreciate input -Trend CK   Elevated Troponin, demand ischemia vs NSTEMI in setting of Cocaine abuse Prolonged QTc, suspect due to Hyperkalemia +/- drug abuse -cardiology last saw 2/1 - imdur 15mg  daily - completed heparin x48 hours  - Continuous cardiac monitoring -Maintain MAP >65 -IV fluids -Vasopressors as needed to maintain MAP goal   Elevated LFT's HCV Ab is Reactive -Trend LFT's and bleeding times - f/u hep C quant study, pending  DVT prophylaxis: sq heparin   Diet:  Diet Orders (From admission, onward)     Start     Ordered   12/01/21 1040  Diet regular Room service appropriate? Yes; Fluid consistency: Thin  Diet effective now       Question Answer Comment  Room service appropriate? Yes   Fluid consistency: Thin      12/01/21 1039            Subjective 12/03/21    Pt reports no complaints today. Endorses understanding of plan. States he is overall improved since admission.   Disposition Plan & Communication  Patient status: Inpatient  Admitted From: Home Disposition: Home Anticipated discharge date: TBD, based on kidney function recovery  Family Communication: none  Consults, Procedures, Significant Events  Consultants:  ID Ortho [facility] Nephrology Cardiology   Procedures/significant events:  Joint aspiration  Antimicrobials:  Anti-infectives (From admission, onward)    Start  Dose/Rate Route Frequency Ordered Stop   12/01/21 2200  clindamycin (CLEOCIN) IVPB 600 mg        600 mg 100 mL/hr over 30 Minutes Intravenous Every 8 hours 12/01/21 2010     12/01/21 2130   piperacillin-tazobactam (ZOSYN) IVPB 3.375 g        3.375 g 12.5 mL/hr over 240 Minutes Intravenous Every 8 hours 12/01/21 2041     12/01/21 2115  vancomycin (VANCOREADY) IVPB 2000 mg/400 mL        2,000 mg 200 mL/hr over 120 Minutes Intravenous  Once 12/01/21 2022 12/02/21 0059   12/01/21 2042  vancomycin variable dose per unstable renal function (pharmacist dosing)  Status:  Discontinued         Does not apply See admin instructions 12/01/21 2042 12/02/21 2341   12/01/21 2030  ceFEPIme (MAXIPIME) 2 g in sodium chloride 0.9 % 100 mL IVPB  Status:  Discontinued        2 g 200 mL/hr over 30 Minutes Intravenous Every 12 hours 12/01/21 2022 12/01/21 2030       Objective   Vitals:   12/03/21 0200 12/03/21 0300 12/03/21 0400 12/03/21 0506  BP: 109/76 118/78 126/85 126/70  Pulse: 72 77 78 79  Resp: 16 19 17 15   Temp:      TempSrc:      SpO2: 100% 100% 100% 100%  Weight:      Height:        Intake/Output Summary (Last 24 hours) at 12/03/2021 0816 Last data filed at 12/03/2021 0507 Gross per 24 hour  Intake 4121.34 ml  Output 830 ml  Net 3291.34 ml   Filed Weights   12/01/21 0951 12/02/21 0500 12/03/21 0138  Weight: 81.2 kg 85.9 kg 90 kg    Patient BMI: Body mass index is 26.91 kg/m.   Physical Exam:  General: awake, alert, NAD HEENT: atraumatic, clear conjunctiva, anicteric sclera, MMM, hearing grossly normal Respiratory: normal respiratory effort. Cardiovascular: quick capillary refill  Nervous: A&O x3. Extremities: moves all equally, no edema, normal tone Skin: dry, intact, normal temperature, normal color. No rashes, lesions or ulcers on exposed skin Psychiatry: flat mood, congruent affect  Labs   I have personally reviewed following labs and imaging studies CBC    Component Value Date/Time   WBC 13.1 (H) 12/03/2021 0455   RBC 3.89 (L) 12/03/2021 0455   HGB 12.4 (L) 12/03/2021 0455   HCT 34.8 (L) 12/03/2021 0455   PLT 205 12/03/2021 0455   MCV 89.5 12/03/2021 0455    MCH 31.9 12/03/2021 0455   MCHC 35.6 12/03/2021 0455   RDW 12.4 12/03/2021 0455   LYMPHSABS 1.1 12/01/2021 0607   MONOABS 1.3 (H) 12/01/2021 0607   EOSABS 0.0 12/01/2021 0607   BASOSABS 0.1 12/01/2021 0607   BMP Latest Ref Rng & Units 12/03/2021 12/02/2021 12/01/2021  Glucose 70 - 99 mg/dL 110(H) 111(H) 97  BUN 6 - 20 mg/dL 76(H) 65(H) 59(H)  Creatinine 0.61 - 1.24 mg/dL 6.10(H) 4.76(H) 4.03(H)  Sodium 135 - 145 mmol/L 132(L) 132(L) 131(L)  Potassium 3.5 - 5.1 mmol/L 4.5 4.3 4.6  Chloride 98 - 111 mmol/L 105 101 101  CO2 22 - 32 mmol/L 17(L) 19(L) 19(L)  Calcium 8.9 - 10.3 mg/dL 6.9(L) 6.9(L) 6.7(L)   Imaging Studies  CT FEMUR LEFT WO CONTRAST  Addendum Date: 12/01/2021   ADDENDUM REPORT: 12/01/2021 19:43 ADDENDUM: Discussed with PA Ouma over the telephone at 7:42 p.m. on 12/01/2021 with read back. Electronically Signed   By:  Ofilia Neas M.D.   On: 12/01/2021 19:43   Result Date: 12/01/2021 CLINICAL DATA:  Cellulitis, swelling EXAM: CT OF THE LOWER LEFT EXTREMITY WITHOUT CONTRAST TECHNIQUE: Multidetector CT imaging of the lower left extremity was performed according to the standard protocol. RADIATION DOSE REDUCTION: This exam was performed according to the departmental dose-optimization program which includes automated exposure control, adjustment of the mA and/or kV according to patient size and/or use of iterative reconstruction technique. COMPARISON:  None. FINDINGS: Bones/Joint/Cartilage No acute fracture identified. No suspicious bony lesions. No bone destruction or periosteal reaction. Appears to be a small knee joint effusion and hip joint effusion. Ligaments Suboptimally assessed by CT. Muscles and Tendons Diffuse marked muscular edema and swelling visualized throughout the entire quadriceps muscle more prominent laterally, with accompanying perimuscular and intramuscular septal/fascial fluid/edema which is more prominent proximally. There is a smaller approximally 3.2 x 2.4 cm focal  mild hypodense area identified in the mid biceps femoris muscle which may also represent edema/inflammation. Similar muscular edema and adjacent fat stranding of the muscles surrounding the left hip. No foci of air identified within the muscle or fascial planes. Soft tissues Mild subcutaneous fat stranding edema in the lateral aspect of the thigh. IMPRESSION: 1. Findings suggestive of extensive myositis involving the muscles about the left hip and most severely the quadriceps muscles, with accompanying fasciitis which is more prominent proximally. Limited evaluation without contrast. No foci of muscular or fascial gas identified, however necrotizing fasciitis can not be excluded as gas is not always seen. Clinical correlation recommended. 2. Smaller more focal hypodensity in the mid biceps femoris muscle which may also represent a smaller area of edema/inflammation. 3. Small hip joint and knee joint effusions. Septic joints can not be excluded. Will contact and notify the ordering provider. Electronically Signed: By: Ofilia Neas M.D. On: 12/01/2021 19:30   MR FEMUR LEFT W WO CONTRAST  Result Date: 12/02/2021 CLINICAL DATA:  Soft tissue infection suspected. History of polysubstance abuse. Acute metabolic encephalopathy. EXAM: MR OF THE LEFT LOWER EXTREMITY WITHOUT AND WITH CONTRAST TECHNIQUE: Multiplanar, multisequence MR imaging of the left thigh was performed both before and after administration of intravenous contrast. Exam was somewhat limited due to patient's ability to stay still due to encephalopathy. CONTRAST:  56mL GADAVIST GADOBUTROL 1 MMOL/ML IV SOLN COMPARISON:  None. FINDINGS: Bones/Joint/Cartilage No evidence of acute fracture or dislocation. Evaluation is however somewhat limited due to significant motion and T2 weighted sequences. Ligaments No significant abnormality Muscles and Tendons There is muscle edema of the lateral thigh muscles involving the rectus lateralis. On post contrast sequence  there is decreased perfusion with peripheral enhancement (series 25, image 57-62). There is a small area of decreased perfusion with peripheral enhancement in the biceps femoris muscle on axial post contrast sequence (series 25 images 56-64) concerning for developing abscess. There is also similar focus of decreased perfusion with peripheral enhancement in the lateral aspect of the gluteus maximus (images 14-18) Soft tissues Marked subcutaneous soft tissue edema about lateral aspect of the thigh without evidence of drainable fluid collection or abscess. IMPRESSION: 1. Well-defined area of decreased perfusion with peripheral enhancement in the gluteus maximus, rectus lateralis as well as in the biceps femoris muscle concerning for myositis/developing abscess. Clinical correlation and short-term follow-up with CT with contrast or sonogram would be helpful. 2. Marked subcutaneous soft tissue edema about the lateral aspect of the thigh. Electronically Signed   By: Keane Police D.O.   On: 12/02/2021 19:38   US Venous  Img Lower Bilateral (DVT)  Result Date: 12/02/2021 CLINICAL DATA:  Bilateral lower extremity pain and edema. 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 Evaluate for acute or chronic DVT. EXAM: BILATERAL LOWER EXTREMITY VENOUS DOPPLER ULTRASOUND TECHNIQUE: Gray-scale sonography with graded compression, as well as color Doppler and duplex ultrasound were performed to evaluate the lower extremity deep venous systems from the level of the common femoral vein and including the common femoral, femoral, profunda femoral, popliteal and calf veins including the posterior tibial, peroneal and gastrocnemius veins when visible. The superficial great saphenous vein was also interrogated. Spectral Doppler was utilized to evaluate flow at rest and with distal augmentation maneuvers in the common femoral, femoral and popliteal veins. COMPARISON:  Bilateral lower extremity venous upper ultrasound 02/26/2014  (positive for DVT involving the right popliteal and tibial veins as well as the left peroneal vein) FINDINGS: RIGHT LOWER EXTREMITY Common Femoral Vein: No evidence of thrombus. Normal compressibility, respiratory phasicity and response to augmentation. Saphenofemoral Junction: No evidence of thrombus. Normal compressibility and flow on color Doppler imaging. Profunda Femoral Vein: No evidence of thrombus. Normal compressibility and flow on color Doppler imaging. Femoral Vein: No evidence of thrombus. Normal compressibility, respiratory phasicity and response to augmentation. Popliteal Vein: No evidence of acute or chronic thrombus. Normal compressibility, respiratory phasicity and response to augmentation. Calf Veins: No evidence of acute or chronic thrombus. Normal compressibility and flow on color Doppler imaging. Superficial Great Saphenous Vein: No evidence of thrombus. Normal compressibility. Other Findings:  None. _________________________________________________________ LEFT LOWER EXTREMITY Common Femoral Vein: No evidence of thrombus. Normal compressibility, respiratory phasicity and response to augmentation. Saphenofemoral Junction: No evidence of thrombus. Normal compressibility and flow on color Doppler imaging. Profunda Femoral Vein: No evidence of thrombus. Normal compressibility and flow on color Doppler imaging. Femoral Vein: No evidence of thrombus. Normal compressibility, respiratory phasicity and response to augmentation. Popliteal Vein: No evidence of thrombus. Normal compressibility, respiratory phasicity and response to augmentation. Calf Veins: No evidence of acute or chronic DVT with special attention paid to the left peroneal vein normal compressibility and flow on color Doppler imaging. Superficial Great Saphenous Vein: No evidence of thrombus. Normal compressibility. Other Findings:  None. IMPRESSION: No evidence of acute or chronic DVT within either lower extremity with special attention  paid to the right popliteal and tibial veins as well as the left peroneal vein. Electronically Signed   By: Sandi Mariscal M.D.   On: 12/02/2021 10:46   DG Chest Port 1 View  Result Date: 12/01/2021 CLINICAL DATA:  A 36 year old male presents for evaluation of altered mental status. EXAM: PORTABLE CHEST 1 VIEW COMPARISON:  Comparison made with December of 2021. FINDINGS: EKG leads project over the upper chest. Trachea midline. Cardiomediastinal contours and hilar structures with suggestion of cardiac enlargement and with central pulmonary vascular engorgement. No lobar consolidation. No sign of pleural effusion. No visible pneumothorax. On limited assessment there is no acute skeletal process. IMPRESSION: Cardiomegaly with central pulmonary vascular engorgement. No consolidation or effusion. Electronically Signed   By: Zetta Bills M.D.   On: 12/01/2021 08:39   DG Shoulder Left  Result Date: 12/02/2021 CLINICAL DATA:  Left shoulder pain. EXAM: LEFT SHOULDER - 2+ VIEW COMPARISON:  Left shoulder x-rays dated June 12, 2011. FINDINGS: No acute fracture or dislocation. Joint spaces are preserved. Bone mineralization is normal. Nonspecific soft tissue swelling around the left shoulder. IMPRESSION: 1. Nonspecific left shoulder soft tissue swelling. No acute osseous abnormality. Electronically Signed   By: Orville Govern.D.  On: 12/02/2021 09:19   DG FLUORO GUIDED NEEDLE PLC ASPIRATION/INJECTION LOC  Result Date: 12/02/2021 CLINICAL DATA:  Myositis of the left thigh. Suspicion for left hip infection. EXAM: LEFT HIP INJECTION UNDER FLUOROSCOPY COMPARISON:  None. FLUOROSCOPY: Radiation Exposure Index (as provided by the fluoroscopic device): 0.1 mGy mGy Kerma PROCEDURE: After informed consent was obtained explaining the risks, benefits, and possible complications of the procedure, the patient was placed supine on the fluoroscopy table. A formal time procedure was performed according to department of protocol.  The left hip was prepped and draped in the usual sterile fashion. 1% lidocaine was used to anesthetize the skin. A 20 gauge spinal needle was advanced into the joint space under fluoroscopic guidance. 3 mL of straw-colored synovial fluid was aspirated from the left hip joint space. The fluid was sent to cytology for evaluation. Hemostasis was achieved. Patient tolerated the procedure well. There were no immediate complications. IMPRESSION: 1. Successful left hip aspiration yielding 3 mL of straw-colored synovial fluid. Technically successful  hip injection under fluoroscopy. Electronically Signed   By: Kathreen Devoid M.D.   On: 12/02/2021 11:13   ECHOCARDIOGRAM COMPLETE  Result Date: 12/01/2021    ECHOCARDIOGRAM REPORT   Patient Name:   JABREEL Staup Date of Exam: 12/01/2021 Medical Rec #:  [id number]      Height:       72.0 in Accession #:    [id number]     Weight:       179.0 lb Date of Birth:  03-02-1986     BSA:          2.033 m Patient Age:    25 years       BP:           141/94 mmHg Patient Gender: M              HR:           86 bpm. Exam Location:  [facility] Procedure: 2D Echo, Cardiac Doppler and Color Doppler Indications:     Elevated Troponin  History:         Patient has no prior history of Echocardiogram examinations. No                  past medical history on file.  Sonographer:     Sherrie Sport Referring Phys:  [id number] Bradly Bienenstock Diagnosing Phys: Kate Sable MD IMPRESSIONS  1. Left ventricular ejection fraction, by estimation, is 55 to 60%. The left ventricle has normal function. The left ventricle has no regional wall motion abnormalities. Left ventricular diastolic parameters were normal.  2. Right ventricular systolic function is low normal. The right ventricular size is normal.  3. The mitral valve is normal in structure. No evidence of mitral valve regurgitation.  4. The aortic valve is tricuspid. Aortic valve regurgitation is not visualized. FINDINGS  Left Ventricle: Left ventricular  ejection fraction, by estimation, is 55 to 60%. The left ventricle has normal function. The left ventricle has no regional wall motion abnormalities. The left ventricular internal cavity size was normal in size. There is  no left ventricular hypertrophy. Left ventricular diastolic parameters were normal. Right Ventricle: The right ventricular size is normal. No increase in right ventricular wall thickness. Right ventricular systolic function is low normal. Left Atrium: Left atrial size was normal in size. Right Atrium: Right atrial size was normal in size. Pericardium: There is no evidence of pericardial effusion. Mitral Valve: The mitral valve is normal in structure. No evidence  of mitral valve regurgitation. MV peak gradient, 3.2 mmHg. The mean mitral valve gradient is 1.0 mmHg. Tricuspid Valve: The tricuspid valve is normal in structure. Tricuspid valve regurgitation is not demonstrated. Aortic Valve: The aortic valve is tricuspid. Aortic valve regurgitation is not visualized. Aortic valve mean gradient measures 2.0 mmHg. Aortic valve peak gradient measures 3.7 mmHg. Aortic valve area, by VTI measures 2.77 cm. Pulmonic Valve: The pulmonic valve was normal in structure. Pulmonic valve regurgitation is not visualized. Aorta: The aortic root is normal in size and structure. Venous: The inferior vena cava was not well visualized. IAS/Shunts: No atrial level shunt detected by color flow Doppler.  LEFT VENTRICLE PLAX 2D LVIDd:         4.50 cm   Diastology LVIDs:         2.70 cm   LV e' medial:    13.10 cm/s LV PW:         1.00 cm   LV E/e' medial:  6.2 LV IVS:        1.10 cm   LV e' lateral:   14.10 cm/s LVOT diam:     2.00 cm   LV E/e' lateral: 5.7 LV SV:         42 LV SV Index:   21 LVOT Area:     3.14 cm  RIGHT VENTRICLE RV Basal diam:  4.10 cm RV S prime:     10.70 cm/s TAPSE (M-mode): 2.0 cm LEFT ATRIUM             Index        RIGHT ATRIUM           Index LA diam:        3.60 cm 1.77 cm/m   RA Area:     20.90  cm LA Vol (A2C):   54.9 ml 26.99 ml/m  RA Volume:   69.80 ml  34.34 ml/m LA Vol (A4C):   24.8 ml 12.20 ml/m LA Biplane Vol: 44.0 ml 21.65 ml/m  AORTIC VALVE                    PULMONIC VALVE AV Area (Vmax):    2.62 cm     PV Vmax:        0.76 m/s AV Area (Vmean):   2.64 cm     PV Vmean:       44.700 cm/s AV Area (VTI):     2.77 cm     PV VTI:         0.106 m AV Vmax:           96.15 cm/s   PV Peak grad:   2.3 mmHg AV Vmean:          69.200 cm/s  PV Mean grad:   1.0 mmHg AV VTI:            0.152 m      RVOT Peak grad: 3 mmHg AV Peak Grad:      3.7 mmHg AV Mean Grad:      2.0 mmHg LVOT Vmax:         80.30 cm/s LVOT Vmean:        58.200 cm/s LVOT VTI:          0.134 m LVOT/AV VTI ratio: 0.88  AORTA Ao Root diam: 3.45 cm MITRAL VALVE               TRICUSPID VALVE MV Area (PHT): 4.96 cm  TR Peak grad:   11.8 mmHg MV Area VTI:   2.43 cm    TR Vmax:        172.00 cm/s MV Peak grad:  3.2 mmHg MV Mean grad:  1.0 mmHg    SHUNTS MV Vmax:       0.89 m/s    Systemic VTI:  0.13 m MV Vmean:      54.8 cm/s   Systemic Diam: 2.00 cm MV Decel Time: 153 msec    Pulmonic VTI:  0.135 m MV E velocity: 81.00 cm/s MV A velocity: 51.40 cm/s MV E/A ratio:  1.58 Kate Sable MD Electronically signed by Kate Sable MD Signature Date/Time: 12/01/2021/3:14:50 PM    Final    US Abdomen Limited RUQ (LIVER/GB)  Result Date: 12/01/2021 CLINICAL DATA:  Elevated LFTs EXAM: ULTRASOUND ABDOMEN LIMITED RIGHT UPPER QUADRANT COMPARISON:  None. FINDINGS: Gallbladder: No gallstones or wall thickening visualized. No sonographic Murphy sign noted by sonographer. Common bile duct: Diameter: 2 mm Liver: No focal lesion identified. Within normal limits in parenchymal echogenicity. Portal vein is patent on color Doppler imaging with normal direction of blood flow towards the liver. Other: None. IMPRESSION: Normal right upper quadrant ultrasound. Electronically Signed   By: Maurine Simmering M.D.   On: 12/01/2021 09:14    Medications   Scheduled  Meds:  Chlorhexidine Gluconate Cloth  6 each Topical Daily   isosorbide mononitrate  15 mg Oral Daily   pantoprazole (PROTONIX) IV  40 mg Intravenous Q24H   No recently discontinued medications to reconcile  LOS: 2 days   Richarda Osmond, DO [facility] 12/03/2021, 8:16 AM   Available by Epic secure chat 7AM-7PM. If 7PM-7AM, please contact night-coverage Refer to [email or web address] to contact the [facility] Attending or Consulting provider for this pt

## 2021-12-03 NOTE — Progress Notes (Signed)
Date of Admission:  12/01/2021    ID: Edwin [name] is a 36 y.o. male Principal Problem:   Acute metabolic encephalopathy Active Problems:   Non-ST elevation (NSTEMI) myocardial infarction ([place])   Cocaine abuse ([place])   Cocaine poisoning ([place])   Rhabdomyolysis   ATN (acute tubular necrosis) ([facility])   Hyperkalemia   Myositis    Subjective: Patient is more alert today.  Does not remember what happened.  The last he knew was he was in his friend's place.  And was doing drugs. Pain left thigh and left deltoid area Not sure whether he fell but he thinks he could have  Medications:   Chlorhexidine Gluconate Cloth  6 each Topical Daily   heparin injection (subcutaneous)  5,000 Units Subcutaneous Q8H   isosorbide mononitrate  15 mg Oral Daily   [START ON 12/04/2021] pantoprazole  40 mg Oral Daily    Objective: Vital signs in last 24 hours: Patient Vitals for the past 24 hrs:  BP Temp Temp src Pulse Resp SpO2 Weight  12/03/21 1300 128/80 97.6 F (36.4 C) Oral 72 15 98 % --  12/03/21 1000 132/90 -- -- 73 16 100 % --  12/03/21 0928 133/86 -- -- 73 10 100 % --  12/03/21 0800 -- 97.8 F (36.6 C) Oral 85 20 100 % --  12/03/21 0700 (!) 135/91 -- -- 78 14 99 % --  12/03/21 0506 126/70 -- -- 79 15 100 % --  12/03/21 0400 126/85 -- -- 78 17 100 % --  12/03/21 0300 118/78 -- -- 77 19 100 % --  12/03/21 0200 109/76 -- -- 72 16 100 % --  12/03/21 0138 -- -- -- -- -- -- 90 kg  12/03/21 0100 121/87 -- -- 78 20 100 % --  12/03/21 0000 115/81 -- -- 74 17 100 % --  12/02/21 2300 98/69 -- -- 72 15 100 % --  12/02/21 2200 106/71 -- -- 71 18 100 % --  12/02/21 2100 99/72 -- -- 70 16 99 % --  12/02/21 2000 97/70 -- -- 73 (!) 21 97 % --  12/02/21 1941 -- 97.6 F (36.4 C) -- -- -- -- --  12/02/21 1919 (!) 108/56 -- -- 74 15 95 % --  12/02/21 1700 102/70 -- -- 74 14 98 % --  12/02/21 1600 (!) 116/92 98.4 F (36.9 C) Oral 79 14 97 % --  12/02/21 1539 (!) 129/91 -- -- 81 18 100 % --    PHYSICAL  EXAM:  General: More alert, no distress, Head: Normocephalic, without obvious abnormality, atraumatic. Eyes: Conjunctivae clear, anicteric sclerae. Pupils are equal ENT Nares normal. No drainage or sinus tenderness. Lips, mucosa, and tongue normal. No Thrush Neck: Supple, symmetrical, no adenopathy, thyroid: non tender no carotid bruit and no JVD. Back: No CVA tenderness. Lungs: Clear to auscultation bilaterally. No Wheezing or Rhonchi. No rales. Heart: Regular rate and rhythm, no murmur, rub or gallop. Abdomen: Soft, non-tender,not distended. Bowel sounds normal. No masses Extremities: Left arm deltoid area has a circular erythema with pustular pinpoint lesions.  Also on his chest there is acneiform eruption  Left thigh swollen and tender.  Left knee area swollen as well. Lymph: Cervical, supraclavicular normal. Neurologic: Grossly non-focal  Lab Results CBC Latest Ref Rng & Units 12/03/2021 12/02/2021 12/01/2021  WBC 4.0 - 10.5 K/uL 13.1(H) 18.3(H) 21.4(H)  Hemoglobin 13.0 - 17.0 g/dL 12.4(L) 15.0 15.9  Hematocrit 39.0 - 52.0 % 34.8(L) 43.9 47.3  Platelets 150 - 400  K/uL 205 237 329    CMP Latest Ref Rng & Units 12/03/2021 12/02/2021 12/01/2021  Glucose 70 - 99 mg/dL 110(H) 111(H) 97  BUN 6 - 20 mg/dL 76(H) 65(H) 59(H)  Creatinine 0.61 - 1.24 mg/dL 6.10(H) 4.76(H) 4.03(H)  Sodium 135 - 145 mmol/L 132(L) 132(L) 131(L)  Potassium 3.5 - 5.1 mmol/L 4.5 4.3 4.6  Chloride 98 - 111 mmol/L 105 101 101  CO2 22 - 32 mmol/L 17(L) 19(L) 19(L)  Calcium 8.9 - 10.3 mg/dL 6.9(L) 6.9(L) 6.7(L)  Total Protein 6.5 - 8.1 g/dL 5.3(L) 5.7(L) -  Total Bilirubin 0.3 - 1.2 mg/dL 0.5 0.6 -  Alkaline Phos 38 - 126 U/L 63 80 -  AST 15 - 41 U/L 456(H) 711(H) -  ALT 0 - 44 U/L 648(H) 634(H) -     Sedimentation Rate No results for input(s): ESRSEDRATE in the last 72 hours. C-Reactive Protein No results for input(s): CRP in the last 72 hours.  Microbiology:  Studies/Results: CT FEMUR LEFT WO  CONTRAST  Addendum Date: 12/01/2021   ADDENDUM REPORT: 12/01/2021 19:43 ADDENDUM: Discussed with PA Ouma over the telephone at 7:42 p.m. on 12/01/2021 with read back. Electronically Signed   By: Ofilia Neas M.D.   On: 12/01/2021 19:43   Result Date: 12/01/2021 CLINICAL DATA:  Cellulitis, swelling EXAM: CT OF THE LOWER LEFT EXTREMITY WITHOUT CONTRAST TECHNIQUE: Multidetector CT imaging of the lower left extremity was performed according to the standard protocol. RADIATION DOSE REDUCTION: This exam was performed according to the departmental dose-optimization program which includes automated exposure control, adjustment of the mA and/or kV according to patient size and/or use of iterative reconstruction technique. COMPARISON:  None. FINDINGS: Bones/Joint/Cartilage No acute fracture identified. No suspicious bony lesions. No bone destruction or periosteal reaction. Appears to be a small knee joint effusion and hip joint effusion. Ligaments Suboptimally assessed by CT. Muscles and Tendons Diffuse marked muscular edema and swelling visualized throughout the entire quadriceps muscle more prominent laterally, with accompanying perimuscular and intramuscular septal/fascial fluid/edema which is more prominent proximally. There is a smaller approximally 3.2 x 2.4 cm focal mild hypodense area identified in the mid biceps femoris muscle which may also represent edema/inflammation. Similar muscular edema and adjacent fat stranding of the muscles surrounding the left hip. No foci of air identified within the muscle or fascial planes. Soft tissues Mild subcutaneous fat stranding edema in the lateral aspect of the thigh. IMPRESSION: 1. Findings suggestive of extensive myositis involving the muscles about the left hip and most severely the quadriceps muscles, with accompanying fasciitis which is more prominent proximally. Limited evaluation without contrast. No foci of muscular or fascial gas identified, however necrotizing  fasciitis can not be excluded as gas is not always seen. Clinical correlation recommended. 2. Smaller more focal hypodensity in the mid biceps femoris muscle which may also represent a smaller area of edema/inflammation. 3. Small hip joint and knee joint effusions. Septic joints can not be excluded. Will contact and notify the ordering provider. Electronically Signed: By: Ofilia Neas M.D. On: 12/01/2021 19:30   MR FEMUR LEFT W WO CONTRAST  Result Date: 12/02/2021 CLINICAL DATA:  Soft tissue infection suspected. History of polysubstance abuse. Acute metabolic encephalopathy. EXAM: MR OF THE LEFT LOWER EXTREMITY WITHOUT AND WITH CONTRAST TECHNIQUE: Multiplanar, multisequence MR imaging of the left thigh was performed both before and after administration of intravenous contrast. Exam was somewhat limited due to patient's ability to stay still due to encephalopathy. CONTRAST:  17mL GADAVIST GADOBUTROL 1 MMOL/ML IV SOLN  COMPARISON:  None. FINDINGS: Bones/Joint/Cartilage No evidence of acute fracture or dislocation. Evaluation is however somewhat limited due to significant motion and T2 weighted sequences. Ligaments No significant abnormality Muscles and Tendons There is muscle edema of the lateral thigh muscles involving the rectus lateralis. On post contrast sequence there is decreased perfusion with peripheral enhancement (series 25, image 57-62). There is a small area of decreased perfusion with peripheral enhancement in the biceps femoris muscle on axial post contrast sequence (series 25 images 56-64) concerning for developing abscess. There is also similar focus of decreased perfusion with peripheral enhancement in the lateral aspect of the gluteus maximus (images 14-18) Soft tissues Marked subcutaneous soft tissue edema about lateral aspect of the thigh without evidence of drainable fluid collection or abscess. IMPRESSION: 1. Well-defined area of decreased perfusion with peripheral enhancement in the  gluteus maximus, rectus lateralis as well as in the biceps femoris muscle concerning for myositis/developing abscess. Clinical correlation and short-term follow-up with CT with contrast or sonogram would be helpful. 2. Marked subcutaneous soft tissue edema about the lateral aspect of the thigh. Electronically Signed   By: Keane Police D.O.   On: 12/02/2021 19:38   US Venous Img Lower Bilateral (DVT)  Result Date: 12/02/2021 CLINICAL DATA:  Bilateral lower extremity pain and edema. 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 Evaluate for acute or chronic DVT. EXAM: BILATERAL LOWER EXTREMITY VENOUS DOPPLER ULTRASOUND TECHNIQUE: Gray-scale sonography with graded compression, as well as color Doppler and duplex ultrasound were performed to evaluate the lower extremity deep venous systems from the level of the common femoral vein and including the common femoral, femoral, profunda femoral, popliteal and calf veins including the posterior tibial, peroneal and gastrocnemius veins when visible. The superficial great saphenous vein was also interrogated. Spectral Doppler was utilized to evaluate flow at rest and with distal augmentation maneuvers in the common femoral, femoral and popliteal veins. COMPARISON:  Bilateral lower extremity venous upper ultrasound 02/26/2014 (positive for DVT involving the right popliteal and tibial veins as well as the left peroneal vein) FINDINGS: RIGHT LOWER EXTREMITY Common Femoral Vein: No evidence of thrombus. Normal compressibility, respiratory phasicity and response to augmentation. Saphenofemoral Junction: No evidence of thrombus. Normal compressibility and flow on color Doppler imaging. Profunda Femoral Vein: No evidence of thrombus. Normal compressibility and flow on color Doppler imaging. Femoral Vein: No evidence of thrombus. Normal compressibility, respiratory phasicity and response to augmentation. Popliteal Vein: No evidence of acute or chronic thrombus. Normal  compressibility, respiratory phasicity and response to augmentation. Calf Veins: No evidence of acute or chronic thrombus. Normal compressibility and flow on color Doppler imaging. Superficial Great Saphenous Vein: No evidence of thrombus. Normal compressibility. Other Findings:  None. _________________________________________________________ LEFT LOWER EXTREMITY Common Femoral Vein: No evidence of thrombus. Normal compressibility, respiratory phasicity and response to augmentation. Saphenofemoral Junction: No evidence of thrombus. Normal compressibility and flow on color Doppler imaging. Profunda Femoral Vein: No evidence of thrombus. Normal compressibility and flow on color Doppler imaging. Femoral Vein: No evidence of thrombus. Normal compressibility, respiratory phasicity and response to augmentation. Popliteal Vein: No evidence of thrombus. Normal compressibility, respiratory phasicity and response to augmentation. Calf Veins: No evidence of acute or chronic DVT with special attention paid to the left peroneal vein normal compressibility and flow on color Doppler imaging. Superficial Great Saphenous Vein: No evidence of thrombus. Normal compressibility. Other Findings:  None. IMPRESSION: No evidence of acute or chronic DVT within either lower extremity with special attention paid to the right popliteal and  tibial veins as well as the left peroneal vein. Electronically Signed   By: Sandi Mariscal M.D.   On: 12/02/2021 10:46   DG Shoulder Left  Result Date: 12/02/2021 CLINICAL DATA:  Left shoulder pain. EXAM: LEFT SHOULDER - 2+ VIEW COMPARISON:  Left shoulder x-rays dated June 12, 2011. FINDINGS: No acute fracture or dislocation. Joint spaces are preserved. Bone mineralization is normal. Nonspecific soft tissue swelling around the left shoulder. IMPRESSION: 1. Nonspecific left shoulder soft tissue swelling. No acute osseous abnormality. Electronically Signed   By: Titus Dubin M.D.   On: 12/02/2021 09:19    DG FLUORO GUIDED NEEDLE PLC ASPIRATION/INJECTION LOC  Result Date: 12/02/2021 CLINICAL DATA:  Myositis of the left thigh. Suspicion for left hip infection. EXAM: LEFT HIP INJECTION UNDER FLUOROSCOPY COMPARISON:  None. FLUOROSCOPY: Radiation Exposure Index (as provided by the fluoroscopic device): 0.1 mGy mGy Kerma PROCEDURE: After informed consent was obtained explaining the risks, benefits, and possible complications of the procedure, the patient was placed supine on the fluoroscopy table. A formal time procedure was performed according to department of protocol. The left hip was prepped and draped in the usual sterile fashion. 1% lidocaine was used to anesthetize the skin. A 20 gauge spinal needle was advanced into the joint space under fluoroscopic guidance. 3 mL of straw-colored synovial fluid was aspirated from the left hip joint space. The fluid was sent to cytology for evaluation. Hemostasis was achieved. Patient tolerated the procedure well. There were no immediate complications. IMPRESSION: 1. Successful left hip aspiration yielding 3 mL of straw-colored synovial fluid. Technically successful  hip injection under fluoroscopy. Electronically Signed   By: Kathreen Devoid M.D.   On: 12/02/2021 11:13   ECHOCARDIOGRAM COMPLETE  Result Date: 12/01/2021    ECHOCARDIOGRAM REPORT   Patient Name:   Edwin [name] Date of Exam: 12/01/2021 Medical Rec #:  [id number]      Height:       72.0 in Accession #:    [id number]     Weight:       179.0 lb Date of Birth:  08/18/1986     BSA:          2.033 m Patient Age:    30 years       BP:           141/94 mmHg Patient Gender: M              HR:           86 bpm. Exam Location:  [facility] Procedure: 2D Echo, Cardiac Doppler and Color Doppler Indications:     Elevated Troponin  History:         Patient has no prior history of Echocardiogram examinations. No                  past medical history on file.  Sonographer:     Sherrie Sport Referring Phys:  [id number] Bradly Bienenstock  Diagnosing Phys: Kate Sable MD IMPRESSIONS  1. Left ventricular ejection fraction, by estimation, is 55 to 60%. The left ventricle has normal function. The left ventricle has no regional wall motion abnormalities. Left ventricular diastolic parameters were normal.  2. Right ventricular systolic function is low normal. The right ventricular size is normal.  3. The mitral valve is normal in structure. No evidence of mitral valve regurgitation.  4. The aortic valve is tricuspid. Aortic valve regurgitation is not visualized. FINDINGS  Left Ventricle: Left ventricular ejection fraction, by estimation, is 55 to  60%. The left ventricle has normal function. The left ventricle has no regional wall motion abnormalities. The left ventricular internal cavity size was normal in size. There is  no left ventricular hypertrophy. Left ventricular diastolic parameters were normal. Right Ventricle: The right ventricular size is normal. No increase in right ventricular wall thickness. Right ventricular systolic function is low normal. Left Atrium: Left atrial size was normal in size. Right Atrium: Right atrial size was normal in size. Pericardium: There is no evidence of pericardial effusion. Mitral Valve: The mitral valve is normal in structure. No evidence of mitral valve regurgitation. MV peak gradient, 3.2 mmHg. The mean mitral valve gradient is 1.0 mmHg. Tricuspid Valve: The tricuspid valve is normal in structure. Tricuspid valve regurgitation is not demonstrated. Aortic Valve: The aortic valve is tricuspid. Aortic valve regurgitation is not visualized. Aortic valve mean gradient measures 2.0 mmHg. Aortic valve peak gradient measures 3.7 mmHg. Aortic valve area, by VTI measures 2.77 cm. Pulmonic Valve: The pulmonic valve was normal in structure. Pulmonic valve regurgitation is not visualized. Aorta: The aortic root is normal in size and structure. Venous: The inferior vena cava was not well visualized. IAS/Shunts: No  atrial level shunt detected by color flow Doppler.  LEFT VENTRICLE PLAX 2D LVIDd:         4.50 cm   Diastology LVIDs:         2.70 cm   LV e' medial:    13.10 cm/s LV PW:         1.00 cm   LV E/e' medial:  6.2 LV IVS:        1.10 cm   LV e' lateral:   14.10 cm/s LVOT diam:     2.00 cm   LV E/e' lateral: 5.7 LV SV:         42 LV SV Index:   21 LVOT Area:     3.14 cm  RIGHT VENTRICLE RV Basal diam:  4.10 cm RV S prime:     10.70 cm/s TAPSE (M-mode): 2.0 cm LEFT ATRIUM             Index        RIGHT ATRIUM           Index LA diam:        3.60 cm 1.77 cm/m   RA Area:     20.90 cm LA Vol (A2C):   54.9 ml 26.99 ml/m  RA Volume:   69.80 ml  34.34 ml/m LA Vol (A4C):   24.8 ml 12.20 ml/m LA Biplane Vol: 44.0 ml 21.65 ml/m  AORTIC VALVE                    PULMONIC VALVE AV Area (Vmax):    2.62 cm     PV Vmax:        0.76 m/s AV Area (Vmean):   2.64 cm     PV Vmean:       44.700 cm/s AV Area (VTI):     2.77 cm     PV VTI:         0.106 m AV Vmax:           96.15 cm/s   PV Peak grad:   2.3 mmHg AV Vmean:          69.200 cm/s  PV Mean grad:   1.0 mmHg AV VTI:            0.152 m  RVOT Peak grad: 3 mmHg AV Peak Grad:      3.7 mmHg AV Mean Grad:      2.0 mmHg LVOT Vmax:         80.30 cm/s LVOT Vmean:        58.200 cm/s LVOT VTI:          0.134 m LVOT/AV VTI ratio: 0.88  AORTA Ao Root diam: 3.45 cm MITRAL VALVE               TRICUSPID VALVE MV Area (PHT): 4.96 cm    TR Peak grad:   11.8 mmHg MV Area VTI:   2.43 cm    TR Vmax:        172.00 cm/s MV Peak grad:  3.2 mmHg MV Mean grad:  1.0 mmHg    SHUNTS MV Vmax:       0.89 m/s    Systemic VTI:  0.13 m MV Vmean:      54.8 cm/s   Systemic Diam: 2.00 cm MV Decel Time: 153 msec    Pulmonic VTI:  0.135 m MV E velocity: 81.00 cm/s MV A velocity: 51.40 cm/s MV E/A ratio:  1.58 Kate Sable MD Electronically signed by Kate Sable MD Signature Date/Time: 12/01/2021/3:14:50 PM    Final      Assessment/Plan:  Acute  encephalopathy secondary to cocaine and benzo use  with metabolic derangement including acute kidney injury.  Much better  Severe rhabdomyolysis related to cocaine and benzo use On IV fluids No evidence of necrotizing fasciitis of the thigh Infectious pyomyositis is a possibility Currently on Zosyn and clindamycin.  Because of absent gas and no evidence of necrotizing fasciitis we will discontinue clindamycin.   Zosyn dose will be adjusted for creatinine clearance.  Polysubstance use  Aspiration of the left knee joint and the hip joint does not favor septic arthritis.  AKI: Worsening creatinine but patient's output is increased and his CK is much better Followed by renal  Hyperkalemia due to rhabdo has resolved  Transaminitis secondary to rhabdomyolysis much improved  Hepatitis C antibody reactive.  Hep C RNA sent.  ID will follow him peripherally this weekend call if needed.

## 2021-12-03 NOTE — Progress Notes (Signed)
[facility]  ROUNDING NOTE   Subjective:  The patient's renal function appears to be worse at the moment with a creatinine up to 6.1.  He has slightly worsening metabolic acidosis with serum bicarbonate of 17. We have been avoiding sodium bicarbonate however given the hypocalcemia.  Objective:  Vital signs in last 24 hours:  Temp:  [97.6 F (36.4 C)-98.5 F (36.9 C)] 97.6 F (36.4 C) (02/02 1941) Pulse Rate:  [70-85] 85 (02/03 0800) Resp:  [13-21] 20 (02/03 0800) BP: (97-174)/(56-96) 135/91 (02/03 0700) SpO2:  [95 %-100 %] 100 % (02/03 0800) Weight:  [90 kg] 90 kg (02/03 0138)  Weight change: 8.8 kg Filed Weights   12/01/21 0951 12/02/21 0500 12/03/21 0138  Weight: 81.2 kg 85.9 kg 90 kg    Intake/Output: I/O last 3 completed shifts: In: 6402.4 [P.O.:840; I.V.:5232.4; IV Piggyback:330] Out: 1290 [Urine:1290]   Intake/Output this shift:  No intake/output data recorded.  Physical Exam: General: No acute distress  Head: Normocephalic, atraumatic. Moist oral mucosal membranes  Eyes: Anicteric  Neck: Supple  Lungs:  Clear to auscultation, normal effort  Heart: S1S2 no rubs  Abdomen:  Soft, nontender, bowel sounds present  Extremities: Trace peripheral edema.  Neurologic: Arousable but confused at times.  Skin: Several lacerations noted on both lower extremities  Access: No hemodialysis access    Basic Metabolic Panel: Recent Labs  Lab 12/01/21 0607 12/01/21 1432 12/01/21 2147 12/02/21 0855 12/03/21 0455  NA 133* 131* 131* 132* 132*  K 6.6* 5.1 4.6 4.3 4.5  CL 96* 101 101 101 105  CO2 22 22 19* 19* 17*  GLUCOSE 210* 110* 97 111* 110*  BUN 46* 53* 59* 65* 76*  CREATININE 3.28* 3.53* 4.03* 4.76* 6.10*  CALCIUM 7.3* 6.8* 6.7* 6.9* 6.9*  MG 2.6*  --   --   --  2.0  PHOS  --   --   --   --  6.3*     Liver Function Tests: Recent Labs  Lab 12/01/21 0607 12/02/21 0855 12/03/21 0455  AST 1,510* 711* 456*  ALT 725* 634* 648*  ALKPHOS 105 80 63   BILITOT 0.3 0.6 0.5  PROT 8.6* 5.7* 5.3*  ALBUMIN 3.9 2.5* 2.2*    Recent Labs  Lab 12/01/21 0607  LIPASE 41    Recent Labs  Lab 12/01/21 0607  AMMONIA 21     CBC: Recent Labs  Lab 12/01/21 0607 12/02/21 0855 12/03/21 0455  WBC 21.4* 18.3* 13.1*  NEUTROABS 18.7*  --   --   HGB 15.9 15.0 12.4*  HCT 47.3 43.9 34.8*  MCV 92.2 90.3 89.5  PLT 329 237 205     Cardiac Enzymes: Recent Labs  Lab 12/01/21 0607 12/02/21 0855 12/03/21 0455  CKTOTAL >50,000* 19,295* 11,555*     BNP: Invalid input(s): POCBNP  CBG: Recent Labs  Lab 12/01/21 0946  GLUCAP 123*     Microbiology: Results for orders placed or performed during the hospital encounter of 12/01/21  Resp Panel by RT-PCR (Flu A&B, Covid) Nasopharyngeal Swab     Status: None   Collection Time: 12/01/21  8:16 AM   Specimen: Nasopharyngeal Swab; Nasopharyngeal(NP) swabs in vial transport medium  Result Value Ref Range Status   SARS Coronavirus 2 by RT PCR NEGATIVE NEGATIVE Final    Comment: (NOTE) SARS-CoV-2 target nucleic acids are NOT DETECTED.  The SARS-CoV-2 RNA is generally detectable in upper respiratory specimens during the acute phase of infection. The lowest concentration of SARS-CoV-2 viral copies this assay  can detect is 138 copies/mL. A negative result does not preclude SARS-Cov-2 infection and should not be used as the sole basis for treatment or other patient management decisions. A negative result may occur with  improper specimen collection/handling, submission of specimen other than nasopharyngeal swab, presence of viral mutation(s) within the areas targeted by this assay, and inadequate number of viral copies(<138 copies/mL). A negative result must be combined with clinical observations, patient history, and epidemiological information. The expected result is Negative.  Fact Sheet for Patients:  EntrepreneurPulse.com.au  Fact Sheet for Healthcare Providers:   IncredibleEmployment.be  This test is no t yet approved or cleared by the [place] FDA and  has been authorized for detection and/or diagnosis of SARS-CoV-2 by FDA under an Emergency Use Authorization (EUA). This EUA will remain  in effect (meaning this test can be used) for the duration of the COVID-19 declaration under Section 564(b)(1) of the Act, 21 U.S.C.section 360bbb-3(b)(1), unless the authorization is terminated  or revoked sooner.       Influenza A by PCR NEGATIVE NEGATIVE Final   Influenza B by PCR NEGATIVE NEGATIVE Final    Comment: (NOTE) The Xpert Xpress SARS-CoV-2/FLU/RSV plus assay is intended as an aid in the diagnosis of influenza from Nasopharyngeal swab specimens and should not be used as a sole basis for treatment. Nasal washings and aspirates are unacceptable for Xpert Xpress SARS-CoV-2/FLU/RSV testing.  Fact Sheet for Patients: EntrepreneurPulse.com.au  Fact Sheet for Healthcare Providers: IncredibleEmployment.be  This test is not yet approved or cleared by the [place] FDA and has been authorized for detection and/or diagnosis of SARS-CoV-2 by FDA under an Emergency Use Authorization (EUA). This EUA will remain in effect (meaning this test can be used) for the duration of the COVID-19 declaration under Section 564(b)(1) of the Act, 21 U.S.C. section 360bbb-3(b)(1), unless the authorization is terminated or revoked.  Performed at [facility], [address] [place], [place] [address]   MRSA Next Gen by PCR, Nasal     Status: None   Collection Time: 12/01/21 10:00 AM   Specimen: Urine, Catheterized; Nasal Swab  Result Value Ref Range Status   MRSA by PCR Next Gen NOT DETECTED NOT DETECTED Final    Comment: (NOTE) The GeneXpert MRSA Assay (FDA approved for NASAL specimens only), is one component of a comprehensive MRSA colonization surveillance program. It is not intended to  diagnose MRSA infection nor to guide or monitor treatment for MRSA infections. Test performance is not FDA approved in patients less than 36 years old. Performed at [facility], [address], [place], [place] [address]   Urine Culture     Status: None   Collection Time: 12/01/21 10:10 AM   Specimen: Urine, Random  Result Value Ref Range Status   Specimen Description   Final    URINE, RANDOM Performed at [facility], [address], [place], [place] [address]    Special Requests   Final    NONE Performed at [facility], [address] [place], [place] [address]    Culture   Final    NO GROWTH Performed at [facility], [place] [address], [place],  [address]    Report Status 12/03/2021 FINAL  Final  CULTURE, BLOOD (ROUTINE X 2) w Reflex to ID Panel     Status: None (Preliminary result)   Collection Time: 12/01/21 10:13 AM   Specimen: BLOOD RIGHT HAND  Result Value Ref Range Status   Specimen Description  BLOOD RIGHT HAND  Final   Special Requests   Final    BOTTLES DRAWN AEROBIC AND ANAEROBIC Blood Culture adequate volume   Culture   Final    NO GROWTH 2 DAYS Performed at [facility], [address] [place], [place] [address]    Report Status PENDING  Incomplete  CULTURE, BLOOD (ROUTINE X 2) w Reflex to ID Panel     Status: None (Preliminary result)   Collection Time: 12/01/21  2:32 PM   Specimen: BLOOD  Result Value Ref Range Status   Specimen Description BLOOD [facility]  Final   Special Requests BOTTLES DRAWN AEROBIC AND ANAEROBIC BCAV  Final   Culture   Final    NO GROWTH 2 DAYS Performed at [facility], [address] [place], [place] [address]    Report Status PENDING  Incomplete  Body fluid culture w Gram Stain     Status: None (Preliminary result)   Collection Time: 12/02/21 11:25 AM   Specimen: PATH Cytology Misc. fluid; Synovial Fluid  Result Value Ref Range Status   Specimen Description   Final     HIP LEFT JOINT Performed at [facility], [address], [place], [place] [address]    Special Requests   Final    NONE Performed at [facility], [place], [place] [address]    Gram Stain   Final    FEW WBC PRESENT, PREDOMINANTLY MONONUCLEAR NO ORGANISMS SEEN    Culture   Final    NO GROWTH < 12 HOURS Performed at [facility] [address] [place], [place] [address]    Report Status PENDING  Incomplete  Body fluid culture w Gram Stain     Status: None (Preliminary result)   Collection Time: 12/02/21  4:25 PM   Specimen: Synovium; Body Fluid  Result Value Ref Range Status   Specimen Description   Final    SYNOVIAL Performed at [facility], [address] [place], [place] [address]    Special Requests   Final    KNEE Performed at [facility], [address] [place], [place] [address]    Gram Stain   Final    RARE WBC PRESENT, PREDOMINANTLY MONONUCLEAR NO ORGANISMS SEEN    Culture   Final    NO GROWTH < 12 HOURS Performed at [facility] [address], [place], [place] [address]    Report Status PENDING  Incomplete    Coagulation Studies: Recent Labs    12/01/21 0846  LABPROT 15.8*  INR 1.3*     Urinalysis: Recent Labs    12/01/21 1000  COLORURINE BROWN*  LABSPEC 1.016  PHURINE TEST NOT REPORTED DUE TO COLOR INTERFERENCE OF URINE PIGMENT  GLUCOSEU TEST NOT REPORTED DUE TO COLOR INTERFERENCE OF URINE PIGMENT*  HGBUR TEST NOT REPORTED DUE TO COLOR INTERFERENCE OF URINE PIGMENT*  BILIRUBINUR TEST NOT REPORTED DUE TO COLOR INTERFERENCE OF URINE PIGMENT*  KETONESUR TEST NOT REPORTED DUE TO COLOR INTERFERENCE OF URINE PIGMENT*  PROTEINUR TEST NOT REPORTED DUE TO COLOR INTERFERENCE OF URINE PIGMENT*  NITRITE TEST NOT REPORTED DUE TO COLOR INTERFERENCE OF URINE PIGMENT*  LEUKOCYTESUR TEST NOT REPORTED DUE TO COLOR INTERFERENCE OF URINE PIGMENT*       Imaging: CT FEMUR LEFT WO  CONTRAST  Addendum Date: 12/01/2021   ADDENDUM REPORT: 12/01/2021 19:43 ADDENDUM: Discussed with PA Ouma over the telephone at 7:42 p.m. on 12/01/2021 with read back. Electronically Signed  By: Ofilia Neas M.D.   On: 12/01/2021 19:43   Result Date: 12/01/2021 CLINICAL DATA:  Cellulitis, swelling EXAM: CT OF THE LOWER LEFT EXTREMITY WITHOUT CONTRAST TECHNIQUE: Multidetector CT imaging of the lower left extremity was performed according to the standard protocol. RADIATION DOSE REDUCTION: This exam was performed according to the departmental dose-optimization program which includes automated exposure control, adjustment of the mA and/or kV according to patient size and/or use of iterative reconstruction technique. COMPARISON:  None. FINDINGS: Bones/Joint/Cartilage No acute fracture identified. No suspicious bony lesions. No bone destruction or periosteal reaction. Appears to be a small knee joint effusion and hip joint effusion. Ligaments Suboptimally assessed by CT. Muscles and Tendons Diffuse marked muscular edema and swelling visualized throughout the entire quadriceps muscle more prominent laterally, with accompanying perimuscular and intramuscular septal/fascial fluid/edema which is more prominent proximally. There is a smaller approximally 3.2 x 2.4 cm focal mild hypodense area identified in the mid biceps femoris muscle which may also represent edema/inflammation. Similar muscular edema and adjacent fat stranding of the muscles surrounding the left hip. No foci of air identified within the muscle or fascial planes. Soft tissues Mild subcutaneous fat stranding edema in the lateral aspect of the thigh. IMPRESSION: 1. Findings suggestive of extensive myositis involving the muscles about the left hip and most severely the quadriceps muscles, with accompanying fasciitis which is more prominent proximally. Limited evaluation without contrast. No foci of muscular or fascial gas identified, however necrotizing  fasciitis can not be excluded as gas is not always seen. Clinical correlation recommended. 2. Smaller more focal hypodensity in the mid biceps femoris muscle which may also represent a smaller area of edema/inflammation. 3. Small hip joint and knee joint effusions. Septic joints can not be excluded. Will contact and notify the ordering provider. Electronically Signed: By: Ofilia Neas M.D. On: 12/01/2021 19:30   MR FEMUR LEFT W WO CONTRAST  Result Date: 12/02/2021 CLINICAL DATA:  Soft tissue infection suspected. History of polysubstance abuse. Acute metabolic encephalopathy. EXAM: MR OF THE LEFT LOWER EXTREMITY WITHOUT AND WITH CONTRAST TECHNIQUE: Multiplanar, multisequence MR imaging of the left thigh was performed both before and after administration of intravenous contrast. Exam was somewhat limited due to patient's ability to stay still due to encephalopathy. CONTRAST:  35mL GADAVIST GADOBUTROL 1 MMOL/ML IV SOLN COMPARISON:  None. FINDINGS: Bones/Joint/Cartilage No evidence of acute fracture or dislocation. Evaluation is however somewhat limited due to significant motion and T2 weighted sequences. Ligaments No significant abnormality Muscles and Tendons There is muscle edema of the lateral thigh muscles involving the rectus lateralis. On post contrast sequence there is decreased perfusion with peripheral enhancement (series 25, image 57-62). There is a small area of decreased perfusion with peripheral enhancement in the biceps femoris muscle on axial post contrast sequence (series 25 images 56-64) concerning for developing abscess. There is also similar focus of decreased perfusion with peripheral enhancement in the lateral aspect of the gluteus maximus (images 14-18) Soft tissues Marked subcutaneous soft tissue edema about lateral aspect of the thigh without evidence of drainable fluid collection or abscess. IMPRESSION: 1. Well-defined area of decreased perfusion with peripheral enhancement in the  gluteus maximus, rectus lateralis as well as in the biceps femoris muscle concerning for myositis/developing abscess. Clinical correlation and short-term follow-up with CT with contrast or sonogram would be helpful. 2. Marked subcutaneous soft tissue edema about the lateral aspect of the thigh. Electronically Signed   By: Keane Police D.O.   On: 12/02/2021 19:38   US  Venous Img Lower Bilateral (DVT)  Result Date: 12/02/2021 CLINICAL DATA:  Bilateral lower extremity pain and edema. 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 Evaluate for acute or chronic DVT. EXAM: BILATERAL LOWER EXTREMITY VENOUS DOPPLER ULTRASOUND TECHNIQUE: Gray-scale sonography with graded compression, as well as color Doppler and duplex ultrasound were performed to evaluate the lower extremity deep venous systems from the level of the common femoral vein and including the common femoral, femoral, profunda femoral, popliteal and calf veins including the posterior tibial, peroneal and gastrocnemius veins when visible. The superficial great saphenous vein was also interrogated. Spectral Doppler was utilized to evaluate flow at rest and with distal augmentation maneuvers in the common femoral, femoral and popliteal veins. COMPARISON:  Bilateral lower extremity venous upper ultrasound 02/26/2014 (positive for DVT involving the right popliteal and tibial veins as well as the left peroneal vein) FINDINGS: RIGHT LOWER EXTREMITY Common Femoral Vein: No evidence of thrombus. Normal compressibility, respiratory phasicity and response to augmentation. Saphenofemoral Junction: No evidence of thrombus. Normal compressibility and flow on color Doppler imaging. Profunda Femoral Vein: No evidence of thrombus. Normal compressibility and flow on color Doppler imaging. Femoral Vein: No evidence of thrombus. Normal compressibility, respiratory phasicity and response to augmentation. Popliteal Vein: No evidence of acute or chronic thrombus. Normal  compressibility, respiratory phasicity and response to augmentation. Calf Veins: No evidence of acute or chronic thrombus. Normal compressibility and flow on color Doppler imaging. Superficial Great Saphenous Vein: No evidence of thrombus. Normal compressibility. Other Findings:  None. _________________________________________________________ LEFT LOWER EXTREMITY Common Femoral Vein: No evidence of thrombus. Normal compressibility, respiratory phasicity and response to augmentation. Saphenofemoral Junction: No evidence of thrombus. Normal compressibility and flow on color Doppler imaging. Profunda Femoral Vein: No evidence of thrombus. Normal compressibility and flow on color Doppler imaging. Femoral Vein: No evidence of thrombus. Normal compressibility, respiratory phasicity and response to augmentation. Popliteal Vein: No evidence of thrombus. Normal compressibility, respiratory phasicity and response to augmentation. Calf Veins: No evidence of acute or chronic DVT with special attention paid to the left peroneal vein normal compressibility and flow on color Doppler imaging. Superficial Great Saphenous Vein: No evidence of thrombus. Normal compressibility. Other Findings:  None. IMPRESSION: No evidence of acute or chronic DVT within either lower extremity with special attention paid to the right popliteal and tibial veins as well as the left peroneal vein. Electronically Signed   By: Sandi Mariscal M.D.   On: 12/02/2021 10:46   DG Shoulder Left  Result Date: 12/02/2021 CLINICAL DATA:  Left shoulder pain. EXAM: LEFT SHOULDER - 2+ VIEW COMPARISON:  Left shoulder x-rays dated June 12, 2011. FINDINGS: No acute fracture or dislocation. Joint spaces are preserved. Bone mineralization is normal. Nonspecific soft tissue swelling around the left shoulder. IMPRESSION: 1. Nonspecific left shoulder soft tissue swelling. No acute osseous abnormality. Electronically Signed   By: Titus Dubin M.D.   On: 12/02/2021 09:19    DG FLUORO GUIDED NEEDLE PLC ASPIRATION/INJECTION LOC  Result Date: 12/02/2021 CLINICAL DATA:  Myositis of the left thigh. Suspicion for left hip infection. EXAM: LEFT HIP INJECTION UNDER FLUOROSCOPY COMPARISON:  None. FLUOROSCOPY: Radiation Exposure Index (as provided by the fluoroscopic device): 0.1 mGy mGy Kerma PROCEDURE: After informed consent was obtained explaining the risks, benefits, and possible complications of the procedure, the patient was placed supine on the fluoroscopy table. A formal time procedure was performed according to department of protocol. The left hip was prepped and draped in the usual sterile fashion. 1% lidocaine was used to  anesthetize the skin. A 20 gauge spinal needle was advanced into the joint space under fluoroscopic guidance. 3 mL of straw-colored synovial fluid was aspirated from the left hip joint space. The fluid was sent to cytology for evaluation. Hemostasis was achieved. Patient tolerated the procedure well. There were no immediate complications. IMPRESSION: 1. Successful left hip aspiration yielding 3 mL of straw-colored synovial fluid. Technically successful  hip injection under fluoroscopy. Electronically Signed   By: Kathreen Devoid M.D.   On: 12/02/2021 11:13   ECHOCARDIOGRAM COMPLETE  Result Date: 12/01/2021    ECHOCARDIOGRAM REPORT   Patient Name:   Edwin [name] Date of Exam: 12/01/2021 Medical Rec #:  [id number]      Height:       72.0 in Accession #:    [id number]     Weight:       179.0 lb Date of Birth:  09-30-1986     BSA:          2.033 m Patient Age:    56 years       BP:           141/94 mmHg Patient Gender: M              HR:           86 bpm. Exam Location:  [facility] Procedure: 2D Echo, Cardiac Doppler and Color Doppler Indications:     Elevated Troponin  History:         Patient has no prior history of Echocardiogram examinations. No                  past medical history on file.  Sonographer:     Sherrie Sport Referring Phys:  [id number] Bradly Bienenstock  Diagnosing Phys: Kate Sable MD IMPRESSIONS  1. Left ventricular ejection fraction, by estimation, is 55 to 60%. The left ventricle has normal function. The left ventricle has no regional wall motion abnormalities. Left ventricular diastolic parameters were normal.  2. Right ventricular systolic function is low normal. The right ventricular size is normal.  3. The mitral valve is normal in structure. No evidence of mitral valve regurgitation.  4. The aortic valve is tricuspid. Aortic valve regurgitation is not visualized. FINDINGS  Left Ventricle: Left ventricular ejection fraction, by estimation, is 55 to 60%. The left ventricle has normal function. The left ventricle has no regional wall motion abnormalities. The left ventricular internal cavity size was normal in size. There is  no left ventricular hypertrophy. Left ventricular diastolic parameters were normal. Right Ventricle: The right ventricular size is normal. No increase in right ventricular wall thickness. Right ventricular systolic function is low normal. Left Atrium: Left atrial size was normal in size. Right Atrium: Right atrial size was normal in size. Pericardium: There is no evidence of pericardial effusion. Mitral Valve: The mitral valve is normal in structure. No evidence of mitral valve regurgitation. MV peak gradient, 3.2 mmHg. The mean mitral valve gradient is 1.0 mmHg. Tricuspid Valve: The tricuspid valve is normal in structure. Tricuspid valve regurgitation is not demonstrated. Aortic Valve: The aortic valve is tricuspid. Aortic valve regurgitation is not visualized. Aortic valve mean gradient measures 2.0 mmHg. Aortic valve peak gradient measures 3.7 mmHg. Aortic valve area, by VTI measures 2.77 cm. Pulmonic Valve: The pulmonic valve was normal in structure. Pulmonic valve regurgitation is not visualized. Aorta: The aortic root is normal in size and structure. Venous: The inferior vena cava was not well visualized. IAS/Shunts: No  atrial level  shunt detected by color flow Doppler.  LEFT VENTRICLE PLAX 2D LVIDd:         4.50 cm   Diastology LVIDs:         2.70 cm   LV e' medial:    13.10 cm/s LV PW:         1.00 cm   LV E/e' medial:  6.2 LV IVS:        1.10 cm   LV e' lateral:   14.10 cm/s LVOT diam:     2.00 cm   LV E/e' lateral: 5.7 LV SV:         42 LV SV Index:   21 LVOT Area:     3.14 cm  RIGHT VENTRICLE RV Basal diam:  4.10 cm RV S prime:     10.70 cm/s TAPSE (M-mode): 2.0 cm LEFT ATRIUM             Index        RIGHT ATRIUM           Index LA diam:        3.60 cm 1.77 cm/m   RA Area:     20.90 cm LA Vol (A2C):   54.9 ml 26.99 ml/m  RA Volume:   69.80 ml  34.34 ml/m LA Vol (A4C):   24.8 ml 12.20 ml/m LA Biplane Vol: 44.0 ml 21.65 ml/m  AORTIC VALVE                    PULMONIC VALVE AV Area (Vmax):    2.62 cm     PV Vmax:        0.76 m/s AV Area (Vmean):   2.64 cm     PV Vmean:       44.700 cm/s AV Area (VTI):     2.77 cm     PV VTI:         0.106 m AV Vmax:           96.15 cm/s   PV Peak grad:   2.3 mmHg AV Vmean:          69.200 cm/s  PV Mean grad:   1.0 mmHg AV VTI:            0.152 m      RVOT Peak grad: 3 mmHg AV Peak Grad:      3.7 mmHg AV Mean Grad:      2.0 mmHg LVOT Vmax:         80.30 cm/s LVOT Vmean:        58.200 cm/s LVOT VTI:          0.134 m LVOT/AV VTI ratio: 0.88  AORTA Ao Root diam: 3.45 cm MITRAL VALVE               TRICUSPID VALVE MV Area (PHT): 4.96 cm    TR Peak grad:   11.8 mmHg MV Area VTI:   2.43 cm    TR Vmax:        172.00 cm/s MV Peak grad:  3.2 mmHg MV Mean grad:  1.0 mmHg    SHUNTS MV Vmax:       0.89 m/s    Systemic VTI:  0.13 m MV Vmean:      54.8 cm/s   Systemic Diam: 2.00 cm MV Decel Time: 153 msec    Pulmonic VTI:  0.135 m MV E velocity: 81.00 cm/s MV A velocity: 51.40 cm/s MV E/A ratio:  1.58 Edwin Edelman  Agbor-Etang MD Electronically signed by Kate Sable MD Signature Date/Time: 12/01/2021/3:14:50 PM    Final      Medications:    sodium chloride 175 mL/hr at 12/03/21 0507   clindamycin  (CLEOCIN) IV 600 mg (12/03/21 0506)   heparin 2,100 Units/hr (12/03/21 0906)   piperacillin-tazobactam (ZOSYN)  IV 3.375 g (12/03/21 [id number])    Chlorhexidine Gluconate Cloth  6 each Topical Daily   isosorbide mononitrate  15 mg Oral Daily   pantoprazole (PROTONIX) IV  40 mg Intravenous Q24H   diazepam, fentaNYL (SUBLIMAZE) injection, haloperidol lactate, ondansetron (ZOFRAN) IV, polyethylene glycol  Assessment/ Plan:  36 y.o. male  with no known past medical history, who was admitted to [facility] on 12/01/2021 for evaluation of altered mental status.  Urine drug screen positive for cocaine.   1.  Acute kidney injury, likely secondary to rhabdomyolysis. 2.  Hyperkalemia serum potassium 6.6 upon admission. 3.  Mild hyponatremia. 4.  Rhabdomyolysis.  Plan:  The patient's renal function actually appears to be worse by the numbers as creatinine was up to 6.1.  However urine output was acceptable at 890 cc over the preceding 24 hours.  CK also down to 11,555.  Ideally we would have like to switch the patient to sodium bicarb and however we will try to avoid this as this may precipitate worsening hypocalcemia.  Instead we will switch the patient to LR at 100 cc/h.  No immediate need for dialysis at the moment however this remains a possibility if renal function continues to deteriorate.  Further plan as patient progresses.   LOS: 2 Jamesyn Lindell 2/3/20239:26 AM

## 2021-12-03 NOTE — Plan of Care (Signed)
°  Problem: Health Behavior/Discharge Planning: Goal: Ability to manage health-related needs will improve Outcome: Progressing   Problem: Clinical Measurements: Goal: Will remain free from infection Outcome: Progressing Goal: Respiratory complications will improve Outcome: Progressing   Problem: Education: Goal: Knowledge of General Education information will improve Description: Including pain rating scale, medication(s)/side effects and non-pharmacologic comfort measures Outcome: Not Progressing   Problem: Clinical Measurements: Goal: Ability to maintain clinical measurements within normal limits will improve Outcome: Not Progressing

## 2021-12-03 NOTE — Progress Notes (Signed)
Chaplain Maggie introduced pastoral care. Pt asked for help letting his nurse know he was in need of pain meds because his leg hurts. When chaplain asked pt if he wanted some company he said, "No."

## 2021-12-03 NOTE — Progress Notes (Signed)
PHARMACIST - PHYSICIAN COMMUNICATION  CONCERNING: IV to Oral Route Change Policy  RECOMMENDATION: This patient is receiving pantoprazole by the intravenous route.  Based on criteria approved by the Pharmacy and Therapeutics Committee, the intravenous medication(s) is/are being converted to the equivalent oral dose form(s).   DESCRIPTION: These criteria include: The patient is eating (either orally or via tube) and/or has been taking other orally administered medications for a least 24 hours The patient has no evidence of active gastrointestinal bleeding or impaired GI absorption (gastrectomy, short bowel, patient on TNA or NPO).  If you have questions about this conversion, please contact the Pharmacy Department   Tressie Ellis, [facility] 12/03/2021 10:54 AM

## 2021-12-03 NOTE — Progress Notes (Signed)
Pharmacy Antibiotic Note  Edwin [name] is a 36 y.o. male admitted on 12/01/2021 with altered mental status. Found to have sepsis due to necrotizing fascitis. Pharmacy has been consulted for vancomycin and piperacillin/tazobactam dosing.  WBC elevated but trending down, VSS stable. IN AKI with rhabdomyolysis with Scr of 6.10 mg/dL (trending up) Unclear baseline renal function.  Plan:   1) Vancomycin discontinued per ID  2) Continue Piperacillin/tazobactam 3.375 grams every 8 hours (4 hour infusion), will monitor renal function tomorrow AM to assess dose adjustments  Also on clindamycin 600 mg every 8 hours  Height: 6' (182.9 cm) Weight: 90 kg (198 lb 6.6 oz) IBW/kg (Calculated) : 77.6  Temp (24hrs), Avg:98.2 F (36.8 C), Min:97.6 F (36.4 C), Max:98.5 F (36.9 C)  Recent Labs  Lab 12/01/21 0607 12/01/21 0816 12/01/21 1014 12/01/21 1432 12/01/21 2147 12/02/21 0006 12/02/21 0855 12/02/21 1951 12/03/21 0455  WBC 21.4*  --   --   --   --   --  18.3*  --  13.1*  CREATININE 3.28*  --   --  3.53* 4.03*  --  4.76*  --  6.10*  LATICACIDVEN  --  3.2* 2.4* 2.4* 2.2* 1.6  --   --   --   VANCORANDOM  --   --   --   --   --   --   --  23  --      Estimated Creatinine Clearance: 18.4 mL/min (A) (by C-G formula based on SCr of 6.1 mg/dL (H)).    No Known Allergies  Antimicrobials this admission: 12/01/21 vancomycin >> 2/1 12/01/21 cefepime  >>  12/01/21 clindamycin >>  Microbiology results: 2/1 BCx: in process 2/1 UCx: in process 2/1 MRSA: (-) 2/2 synovial fluid Cx: pending   Thank you for allowing pharmacy to be a part of this patients care.  Elvia Collum 12/03/2021 8:18 AM

## 2021-12-03 NOTE — Consult Note (Addendum)
ANTICOAGULATION CONSULT NOTE   Pharmacy Consult for heparin Indication: ACS Troponin [phone], cardiology recommended heparin x48 h, no current plans for invasive work-up  No Known Allergies  Patient Measurements: Height: 6' (182.9 cm) Weight: 90 kg (198 lb 6.6 oz) IBW/kg (Calculated) : 77.6 Heparin Dosing Weight: 81.2 kg  Labs: Recent Labs    12/01/21 0607 12/01/21 0846 12/01/21 1140 12/01/21 1432 12/01/21 1432 12/01/21 1756 12/01/21 2147 12/02/21 0855 12/02/21 1951 12/03/21 0455  HGB 15.9  --   --   --   --   --   --  15.0  --  12.4*  HCT 47.3  --   --   --   --   --   --  43.9  --  34.8*  PLT 329  --   --   --   --   --   --  237  --  205  APTT  --  32  --   --   --   --   --   --   --   --   LABPROT  --  15.8*  --   --   --   --   --   --   --   --   INR  --  1.3*  --   --   --   --   --   --   --   --   HEPARINUNFRC  --   --   --   --    < > <0.10*  --  <0.10* 0.11* 0.19*  CREATININE 3.28*  --   --  3.53*  --   --  4.03* 4.76*  --  6.10*  CKTOTAL >50,000*  --   --   --   --   --   --  19,295*  --  11,555*  TROPONINIHS 1,334*  --  2,411* 2,444*  --  1,794*  --   --   --   --    < > = values in this interval not displayed.     Estimated Creatinine Clearance: 18.4 mL/min (A) (by C-G formula based on SCr of 6.1 mg/dL (H)).  Assessment: 36 year old male with past medical history of polysubstance abuse who presented with acute metabolic encephalopathy possibly due to drug overdose UDS + for benzodiazepine, cocaine. Pharmacy has been consulted for heparin monitoring and dosing. H&H, platelets wnl   Goal of Therapy:  Heparin level 0.3-0.7 units/ml Monitor platelets by anticoagulation protocol: Yes   Plan:  Heparin level remains subtherapeutic despite rate increase: increase heparin rate to 2100 u/hr  48 h heparin stop date per cardiology, heparin drip will stop today at 1200 Continue to monitor H&H and platelets per protocol  [facility] 12/03/2021,7:15  AM

## 2021-12-03 NOTE — Progress Notes (Signed)
Progress Note  Patient Name: Edwin [name] Date of Encounter: 12/03/2021  [facility] Cardiologist: [facility]  Subjective   Reports feels weak, not much appetite Discussed echocardiogram findings with him, normal ejection fraction  Inpatient Medications    Scheduled Meds:  Chlorhexidine Gluconate Cloth  6 each Topical Daily   heparin injection (subcutaneous)  5,000 Units Subcutaneous Q8H   isosorbide mononitrate  15 mg Oral Daily   [START ON 12/04/2021] pantoprazole  40 mg Oral Daily   Continuous Infusions:  lactated ringers 100 mL/hr at 12/03/21 1306   piperacillin-tazobactam (ZOSYN)  IV     PRN Meds: diazepam, haloperidol lactate, HYDROmorphone, ondansetron (ZOFRAN) IV, polyethylene glycol   Vital Signs    Vitals:   12/03/21 0800 12/03/21 0928 12/03/21 1000 12/03/21 1300  BP:  133/86 132/90 128/80  Pulse: 85 73 73 72  Resp: 20 10 16 15   Temp: 97.8 F (36.6 C)   97.6 F (36.4 C)  TempSrc: Oral   Oral  SpO2: 100% 100% 100% 98%  Weight:      Height:        Intake/Output Summary (Last 24 hours) at 12/03/2021 1454 Last data filed at 12/03/2021 1405 Gross per 24 hour  Intake 4718.94 ml  Output 950 ml  Net 3768.94 ml   Last 3 Weights 12/03/2021 12/02/2021 12/01/2021  Weight (lbs) 198 lb 6.6 oz 189 lb 6 oz 179 lb 0.2 oz  Weight (kg) 90 kg 85.9 kg 81.2 kg      Telemetry    Normal sinus rhythm- Personally Reviewed  ECG     - Personally Reviewed  Physical Exam   GEN: No acute distress.   Neck: No JVD Cardiac: RRR, no murmurs, rubs, or gallops.  Respiratory: Clear to auscultation bilaterally. GI: Soft, nontender, non-distended  MS: No edema; No deformity. Neuro:  Nonfocal  Psych: Normal affect   Labs    High Sensitivity Troponin:   Recent Labs  Lab 12/01/21 0607 12/01/21 1140 12/01/21 1432 12/01/21 1756  TROPONINIHS 1,334* 2,411* 2,444* 1,794*     Chemistry Recent Labs  Lab 12/01/21 [id number] 12/01/21 1432 12/01/21 2147 12/02/21 0855  12/03/21 0455  NA 133*   < > 131* 132* 132*  K 6.6*   < > 4.6 4.3 4.5  CL 96*   < > 101 101 105  CO2 22   < > 19* 19* 17*  GLUCOSE 210*   < > 97 111* 110*  BUN 46*   < > 59* 65* 76*  CREATININE 3.28*   < > 4.03* 4.76* 6.10*  CALCIUM 7.3*   < > 6.7* 6.9* 6.9*  MG 2.6*  --   --   --  2.0  PROT 8.6*  --   --  5.7* 5.3*  ALBUMIN 3.9  --   --  2.5* 2.2*  AST 1,510*  --   --  711* 456*  ALT 725*  --   --  634* 648*  ALKPHOS 105  --   --  80 63  BILITOT 0.3  --   --  0.6 0.5  GFRNONAA 24*   < > 19* 15* 11*  ANIONGAP 15   < > 11 12 10    < > = values in this interval not displayed.    Lipids No results for input(s): CHOL, TRIG, HDL, LABVLDL, LDLCALC, CHOLHDL in the last 168 hours.  Hematology Recent Labs  Lab 12/01/21 0607 12/02/21 0855 12/03/21 0455  WBC 21.4* 18.3* 13.1*  RBC 5.13 4.86 3.89*  HGB 15.9 15.0 12.4*  HCT 47.3 43.9 34.8*  MCV 92.2 90.3 89.5  MCH 31.0 30.9 31.9  MCHC 33.6 34.2 35.6  RDW 12.4 12.6 12.4  PLT 329 237 205   Thyroid No results for input(s): TSH, FREET4 in the last 168 hours.  BNPNo results for input(s): BNP, PROBNP in the last 168 hours.  DDimer No results for input(s): DDIMER in the last 168 hours.   Radiology    CT FEMUR LEFT WO CONTRAST  Addendum Date: 12/01/2021   ADDENDUM REPORT: 12/01/2021 19:43 ADDENDUM: Discussed with PA Ouma over the telephone at 7:42 p.m. on 12/01/2021 with read back. Electronically Signed   By: Ofilia Neas M.D.   On: 12/01/2021 19:43   Result Date: 12/01/2021 CLINICAL DATA:  Cellulitis, swelling EXAM: CT OF THE LOWER LEFT EXTREMITY WITHOUT CONTRAST TECHNIQUE: Multidetector CT imaging of the lower left extremity was performed according to the standard protocol. RADIATION DOSE REDUCTION: This exam was performed according to the departmental dose-optimization program which includes automated exposure control, adjustment of the mA and/or kV according to patient size and/or use of iterative reconstruction technique.  COMPARISON:  None. FINDINGS: Bones/Joint/Cartilage No acute fracture identified. No suspicious bony lesions. No bone destruction or periosteal reaction. Appears to be a small knee joint effusion and hip joint effusion. Ligaments Suboptimally assessed by CT. Muscles and Tendons Diffuse marked muscular edema and swelling visualized throughout the entire quadriceps muscle more prominent laterally, with accompanying perimuscular and intramuscular septal/fascial fluid/edema which is more prominent proximally. There is a smaller approximally 3.2 x 2.4 cm focal mild hypodense area identified in the mid biceps femoris muscle which may also represent edema/inflammation. Similar muscular edema and adjacent fat stranding of the muscles surrounding the left hip. No foci of air identified within the muscle or fascial planes. Soft tissues Mild subcutaneous fat stranding edema in the lateral aspect of the thigh. IMPRESSION: 1. Findings suggestive of extensive myositis involving the muscles about the left hip and most severely the quadriceps muscles, with accompanying fasciitis which is more prominent proximally. Limited evaluation without contrast. No foci of muscular or fascial gas identified, however necrotizing fasciitis can not be excluded as gas is not always seen. Clinical correlation recommended. 2. Smaller more focal hypodensity in the mid biceps femoris muscle which may also represent a smaller area of edema/inflammation. 3. Small hip joint and knee joint effusions. Septic joints can not be excluded. Will contact and notify the ordering provider. Electronically Signed: By: Ofilia Neas M.D. On: 12/01/2021 19:30   MR FEMUR LEFT W WO CONTRAST  Result Date: 12/02/2021 CLINICAL DATA:  Soft tissue infection suspected. History of polysubstance abuse. Acute metabolic encephalopathy. EXAM: MR OF THE LEFT LOWER EXTREMITY WITHOUT AND WITH CONTRAST TECHNIQUE: Multiplanar, multisequence MR imaging of the left thigh was  performed both before and after administration of intravenous contrast. Exam was somewhat limited due to patient's ability to stay still due to encephalopathy. CONTRAST:  24mL GADAVIST GADOBUTROL 1 MMOL/ML IV SOLN COMPARISON:  None. FINDINGS: Bones/Joint/Cartilage No evidence of acute fracture or dislocation. Evaluation is however somewhat limited due to significant motion and T2 weighted sequences. Ligaments No significant abnormality Muscles and Tendons There is muscle edema of the lateral thigh muscles involving the rectus lateralis. On post contrast sequence there is decreased perfusion with peripheral enhancement (series 25, image 57-62). There is a small area of decreased perfusion with peripheral enhancement in the biceps femoris muscle on axial post contrast sequence (series 25 images 56-64) concerning for developing abscess. There  is also similar focus of decreased perfusion with peripheral enhancement in the lateral aspect of the gluteus maximus (images 14-18) Soft tissues Marked subcutaneous soft tissue edema about lateral aspect of the thigh without evidence of drainable fluid collection or abscess. IMPRESSION: 1. Well-defined area of decreased perfusion with peripheral enhancement in the gluteus maximus, rectus lateralis as well as in the biceps femoris muscle concerning for myositis/developing abscess. Clinical correlation and short-term follow-up with CT with contrast or sonogram would be helpful. 2. Marked subcutaneous soft tissue edema about the lateral aspect of the thigh. Electronically Signed   By: Keane Police D.O.   On: 12/02/2021 19:38   US Venous Img Lower Bilateral (DVT)  Result Date: 12/02/2021 CLINICAL DATA:  Bilateral lower extremity pain and edema. 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 Evaluate for acute or chronic DVT. EXAM: BILATERAL LOWER EXTREMITY VENOUS DOPPLER ULTRASOUND TECHNIQUE: Gray-scale sonography with graded compression, as well as color Doppler and duplex  ultrasound were performed to evaluate the lower extremity deep venous systems from the level of the common femoral vein and including the common femoral, femoral, profunda femoral, popliteal and calf veins including the posterior tibial, peroneal and gastrocnemius veins when visible. The superficial great saphenous vein was also interrogated. Spectral Doppler was utilized to evaluate flow at rest and with distal augmentation maneuvers in the common femoral, femoral and popliteal veins. COMPARISON:  Bilateral lower extremity venous upper ultrasound 02/26/2014 (positive for DVT involving the right popliteal and tibial veins as well as the left peroneal vein) FINDINGS: RIGHT LOWER EXTREMITY Common Femoral Vein: No evidence of thrombus. Normal compressibility, respiratory phasicity and response to augmentation. Saphenofemoral Junction: No evidence of thrombus. Normal compressibility and flow on color Doppler imaging. Profunda Femoral Vein: No evidence of thrombus. Normal compressibility and flow on color Doppler imaging. Femoral Vein: No evidence of thrombus. Normal compressibility, respiratory phasicity and response to augmentation. Popliteal Vein: No evidence of acute or chronic thrombus. Normal compressibility, respiratory phasicity and response to augmentation. Calf Veins: No evidence of acute or chronic thrombus. Normal compressibility and flow on color Doppler imaging. Superficial Great Saphenous Vein: No evidence of thrombus. Normal compressibility. Other Findings:  None. _________________________________________________________ LEFT LOWER EXTREMITY Common Femoral Vein: No evidence of thrombus. Normal compressibility, respiratory phasicity and response to augmentation. Saphenofemoral Junction: No evidence of thrombus. Normal compressibility and flow on color Doppler imaging. Profunda Femoral Vein: No evidence of thrombus. Normal compressibility and flow on color Doppler imaging. Femoral Vein: No evidence of  thrombus. Normal compressibility, respiratory phasicity and response to augmentation. Popliteal Vein: No evidence of thrombus. Normal compressibility, respiratory phasicity and response to augmentation. Calf Veins: No evidence of acute or chronic DVT with special attention paid to the left peroneal vein normal compressibility and flow on color Doppler imaging. Superficial Great Saphenous Vein: No evidence of thrombus. Normal compressibility. Other Findings:  None. IMPRESSION: No evidence of acute or chronic DVT within either lower extremity with special attention paid to the right popliteal and tibial veins as well as the left peroneal vein. Electronically Signed   By: Sandi Mariscal M.D.   On: 12/02/2021 10:46   DG Shoulder Left  Result Date: 12/02/2021 CLINICAL DATA:  Left shoulder pain. EXAM: LEFT SHOULDER - 2+ VIEW COMPARISON:  Left shoulder x-rays dated June 12, 2011. FINDINGS: No acute fracture or dislocation. Joint spaces are preserved. Bone mineralization is normal. Nonspecific soft tissue swelling around the left shoulder. IMPRESSION: 1. Nonspecific left shoulder soft tissue swelling. No acute osseous abnormality. Electronically Signed  By: Titus Dubin M.D.   On: 12/02/2021 09:19   DG FLUORO GUIDED NEEDLE PLC ASPIRATION/INJECTION LOC  Result Date: 12/02/2021 CLINICAL DATA:  Myositis of the left thigh. Suspicion for left hip infection. EXAM: LEFT HIP INJECTION UNDER FLUOROSCOPY COMPARISON:  None. FLUOROSCOPY: Radiation Exposure Index (as provided by the fluoroscopic device): 0.1 mGy mGy Kerma PROCEDURE: After informed consent was obtained explaining the risks, benefits, and possible complications of the procedure, the patient was placed supine on the fluoroscopy table. A formal time procedure was performed according to department of protocol. The left hip was prepped and draped in the usual sterile fashion. 1% lidocaine was used to anesthetize the skin. A 20 gauge spinal needle was advanced into  the joint space under fluoroscopic guidance. 3 mL of straw-colored synovial fluid was aspirated from the left hip joint space. The fluid was sent to cytology for evaluation. Hemostasis was achieved. Patient tolerated the procedure well. There were no immediate complications. IMPRESSION: 1. Successful left hip aspiration yielding 3 mL of straw-colored synovial fluid. Technically successful  hip injection under fluoroscopy. Electronically Signed   By: Kathreen Devoid M.D.   On: 12/02/2021 11:13    Cardiac Studies   Echo  1. Left ventricular ejection fraction, by estimation, is 55 to 60%. The  left ventricle has normal function. The left ventricle has no regional  wall motion abnormalities. Left ventricular diastolic parameters were  normal.   2. Right ventricular systolic function is low normal. The right  ventricular size is normal.   3. The mitral valve is normal in structure. No evidence of mitral valve  regurgitation.   4. The aortic valve is tricuspid. Aortic valve regurgitation is not  visualized.   Patient Profile     36 y.o. male With history of polysubstance abuse presenting with altered mental status, agitation, CT scan left femur concerning for extensive myositis involving muscle left hip, quadriceps muscle with accompanying fasciitis on broad-spectrum antibiotics, elevated troponin consistent with demand ischemia   Assessment & Plan    Elevated troponin, demand ischemia Does not appear to be a primary ischemic event In the setting of cocaine, marked electrolyte abnormality, acute renal failure Presented agitated, thrashing, pulling his hair out Normal ejection fraction Heparin not needed for cardiac etiology at this point No further cardiac work-up needed  Polysubstance abuse Positive cocaine, benzos Cessation recommended  Rhabdo, severe Elevated CK, renal failure, LFTs elevated On IV fluids, broad-spectrum antibiotics out of concern for necrotizing fasciitis Blood  cultures pending  Acute renal failure  Echocardiogram results discussed with him,  Total encounter time more than 50 minutes  Greater than 50% was spent in counseling and coordination of care with the patient   [facility] will sign off.   Medication Recommendations: No medication changes needed Other recommendations (labs, testing, etc): No further testing Follow up as an outpatient: Primary care  For questions or updates, please contact [place] Please consult [email or web address] for contact info under        Signed, Ida Rogue, MD  12/03/2021, 2:54 PM

## 2021-12-03 NOTE — Progress Notes (Signed)
Patient's MRI of the left femur shows no focal abscess.  There is no culture growth on hip or knee aspiration.  Blood cultures are negative to date.  Patient is afebrile.  White count is improving, down to 13.1 today..  No definitive evidence for septic left hip or knee joint at this time.  No plan for surgical intervention at this time.  Total CK is now 11,000 down from 19,000 yesterday.  Objective:   VITALS:   Vitals:   12/03/21 0800 12/03/21 0928 12/03/21 1000 12/03/21 1300  BP:  133/86 132/90 128/80  Pulse: 85 73 73 72  Resp: 20 10 16 15   Temp: 97.8 F (36.6 C)   97.6 F (36.4 C)  TempSrc: Oral   Oral  SpO2: 100% 100% 100% 98%  Weight:      Height:        LABS  Results for orders placed or performed during the hospital encounter of 12/01/21 (from the past 24 hour(s))  Synovial cell count + diff, w/ crystals     Status: Abnormal   Collection Time: 12/02/21  4:25 PM  Result Value Ref Range   Color, Synovial PINK (A) YELLOW   Appearance-Synovial CLOUDY (A) CLEAR   Crystals, Fluid NO CRYSTALS SEEN    WBC, Synovial 96 0 - 200 /cu mm   Neutrophil, Synovial 93 %   Lymphocytes-Synovial Fld 7 %   Monocyte-Macrophage-Synovial Fluid 0 %   Eosinophils-Synovial 0 %  Body fluid culture w Gram Stain     Status: None (Preliminary result)   Collection Time: 12/02/21  4:25 PM   Specimen: Synovium; Body Fluid  Result Value Ref Range   Specimen Description      SYNOVIAL Performed at [facility], [address] [place], [place] [address]    Special Requests      KNEE Performed at [facility], [place], [place] [address]    Gram Stain      RARE WBC PRESENT, PREDOMINANTLY MONONUCLEAR NO ORGANISMS SEEN    Culture      NO GROWTH < 12 HOURS Performed at [facility] [address], [place], [place] [address]    Report Status PENDING   Heparin level (unfractionated)     Status: Abnormal   Collection Time: 12/02/21  7:51 PM  Result Value  Ref Range   Heparin Unfractionated 0.11 (L) 0.30 - 0.70 IU/mL  Vancomycin, random     Status: None   Collection Time: 12/02/21  7:51 PM  Result Value Ref Range   Vancomycin Rm 23   Comprehensive metabolic panel     Status: Abnormal   Collection Time: 12/03/21  4:55 AM  Result Value Ref Range   Sodium 132 (L) 135 - 145 mmol/L   Potassium 4.5 3.5 - 5.1 mmol/L   Chloride 105 98 - 111 mmol/L   CO2 17 (L) 22 - 32 mmol/L   Glucose, Bld 110 (H) 70 - 99 mg/dL   BUN 76 (H) 6 - 20 mg/dL   Creatinine, Ser 6.10 (H) 0.61 - 1.24 mg/dL   Calcium 6.9 (L) 8.9 - 10.3 mg/dL   Total Protein 5.3 (L) 6.5 - 8.1 g/dL   Albumin 2.2 (L) 3.5 - 5.0 g/dL   AST 456 (H) 15 - 41 U/L   ALT 648 (H) 0 - 44 U/L   Alkaline Phosphatase 63 38 - 126 U/L   Total Bilirubin 0.5 0.3 - 1.2 mg/dL   GFR, Estimated 11 (L) >60 mL/min  Anion gap 10 5 - 15  CK     Status: Abnormal   Collection Time: 12/03/21  4:55 AM  Result Value Ref Range   Total CK 11,555 (H) 49 - 397 U/L  Procalcitonin     Status: None   Collection Time: 12/03/21  4:55 AM  Result Value Ref Range   Procalcitonin 6.85 ng/mL  CBC     Status: Abnormal   Collection Time: 12/03/21  4:55 AM  Result Value Ref Range   WBC 13.1 (H) 4.0 - 10.5 K/uL   RBC 3.89 (L) 4.22 - 5.81 MIL/uL   Hemoglobin 12.4 (L) 13.0 - 17.0 g/dL   HCT 34.8 (L) 39.0 - 52.0 %   MCV 89.5 80.0 - 100.0 fL   MCH 31.9 26.0 - 34.0 pg   MCHC 35.6 30.0 - 36.0 g/dL   RDW 12.4 11.5 - 15.5 %   Platelets 205 150 - 400 K/uL   nRBC 0.0 0.0 - 0.2 %  Phosphorus     Status: Abnormal   Collection Time: 12/03/21  4:55 AM  Result Value Ref Range   Phosphorus 6.3 (H) 2.5 - 4.6 mg/dL  Magnesium     Status: None   Collection Time: 12/03/21  4:55 AM  Result Value Ref Range   Magnesium 2.0 1.7 - 2.4 mg/dL  Heparin level (unfractionated)     Status: Abnormal   Collection Time: 12/03/21  4:55 AM  Result Value Ref Range   Heparin Unfractionated 0.19 (L) 0.30 - 0.70 IU/mL    CT FEMUR LEFT WO  CONTRAST  Addendum Date: 12/01/2021   ADDENDUM REPORT: 12/01/2021 19:43 ADDENDUM: Discussed with PA Ouma over the telephone at 7:42 p.m. on 12/01/2021 with read back. Electronically Signed   By: Ofilia Neas M.D.   On: 12/01/2021 19:43   Result Date: 12/01/2021 CLINICAL DATA:  Cellulitis, swelling EXAM: CT OF THE LOWER LEFT EXTREMITY WITHOUT CONTRAST TECHNIQUE: Multidetector CT imaging of the lower left extremity was performed according to the standard protocol. RADIATION DOSE REDUCTION: This exam was performed according to the departmental dose-optimization program which includes automated exposure control, adjustment of the mA and/or kV according to patient size and/or use of iterative reconstruction technique. COMPARISON:  None. FINDINGS: Bones/Joint/Cartilage No acute fracture identified. No suspicious bony lesions. No bone destruction or periosteal reaction. Appears to be a small knee joint effusion and hip joint effusion. Ligaments Suboptimally assessed by CT. Muscles and Tendons Diffuse marked muscular edema and swelling visualized throughout the entire quadriceps muscle more prominent laterally, with accompanying perimuscular and intramuscular septal/fascial fluid/edema which is more prominent proximally. There is a smaller approximally 3.2 x 2.4 cm focal mild hypodense area identified in the mid biceps femoris muscle which may also represent edema/inflammation. Similar muscular edema and adjacent fat stranding of the muscles surrounding the left hip. No foci of air identified within the muscle or fascial planes. Soft tissues Mild subcutaneous fat stranding edema in the lateral aspect of the thigh. IMPRESSION: 1. Findings suggestive of extensive myositis involving the muscles about the left hip and most severely the quadriceps muscles, with accompanying fasciitis which is more prominent proximally. Limited evaluation without contrast. No foci of muscular or fascial gas identified, however necrotizing  fasciitis can not be excluded as gas is not always seen. Clinical correlation recommended. 2. Smaller more focal hypodensity in the mid biceps femoris muscle which may also represent a smaller area of edema/inflammation. 3. Small hip joint and knee joint effusions. Septic joints can not be excluded. Will  contact and notify the ordering provider. Electronically Signed: By: Ofilia Neas M.D. On: 12/01/2021 19:30   MR FEMUR LEFT W WO CONTRAST  Result Date: 12/02/2021 CLINICAL DATA:  Soft tissue infection suspected. History of polysubstance abuse. Acute metabolic encephalopathy. EXAM: MR OF THE LEFT LOWER EXTREMITY WITHOUT AND WITH CONTRAST TECHNIQUE: Multiplanar, multisequence MR imaging of the left thigh was performed both before and after administration of intravenous contrast. Exam was somewhat limited due to patient's ability to stay still due to encephalopathy. CONTRAST:  35mL GADAVIST GADOBUTROL 1 MMOL/ML IV SOLN COMPARISON:  None. FINDINGS: Bones/Joint/Cartilage No evidence of acute fracture or dislocation. Evaluation is however somewhat limited due to significant motion and T2 weighted sequences. Ligaments No significant abnormality Muscles and Tendons There is muscle edema of the lateral thigh muscles involving the rectus lateralis. On post contrast sequence there is decreased perfusion with peripheral enhancement (series 25, image [id number]). There is a small area of decreased perfusion with peripheral enhancement in the biceps femoris muscle on axial post contrast sequence (series 25 images 56-64) concerning for developing abscess. There is also similar focus of decreased perfusion with peripheral enhancement in the lateral aspect of the gluteus maximus (images 14-18) Soft tissues Marked subcutaneous soft tissue edema about lateral aspect of the thigh without evidence of drainable fluid collection or abscess. IMPRESSION: 1. Well-defined area of decreased perfusion with peripheral enhancement in the  gluteus maximus, rectus lateralis as well as in the biceps femoris muscle concerning for myositis/developing abscess. Clinical correlation and short-term follow-up with CT with contrast or sonogram would be helpful. 2. Marked subcutaneous soft tissue edema about the lateral aspect of the thigh. Electronically Signed   By: Keane Police D.O.   On: 12/02/2021 19:38   US Venous Img Lower Bilateral (DVT)  Result Date: 12/02/2021 CLINICAL DATA:  Bilateral lower extremity pain and edema. 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 Evaluate for acute or chronic DVT. EXAM: BILATERAL LOWER EXTREMITY VENOUS DOPPLER ULTRASOUND TECHNIQUE: Gray-scale sonography with graded compression, as well as color Doppler and duplex ultrasound were performed to evaluate the lower extremity deep venous systems from the level of the common femoral vein and including the common femoral, femoral, profunda femoral, popliteal and calf veins including the posterior tibial, peroneal and gastrocnemius veins when visible. The superficial great saphenous vein was also interrogated. Spectral Doppler was utilized to evaluate flow at rest and with distal augmentation maneuvers in the common femoral, femoral and popliteal veins. COMPARISON:  Bilateral lower extremity venous upper ultrasound 02/26/2014 (positive for DVT involving the right popliteal and tibial veins as well as the left peroneal vein) FINDINGS: RIGHT LOWER EXTREMITY Common Femoral Vein: No evidence of thrombus. Normal compressibility, respiratory phasicity and response to augmentation. Saphenofemoral Junction: No evidence of thrombus. Normal compressibility and flow on color Doppler imaging. Profunda Femoral Vein: No evidence of thrombus. Normal compressibility and flow on color Doppler imaging. Femoral Vein: No evidence of thrombus. Normal compressibility, respiratory phasicity and response to augmentation. Popliteal Vein: No evidence of acute or chronic thrombus. Normal  compressibility, respiratory phasicity and response to augmentation. Calf Veins: No evidence of acute or chronic thrombus. Normal compressibility and flow on color Doppler imaging. Superficial Great Saphenous Vein: No evidence of thrombus. Normal compressibility. Other Findings:  None. _________________________________________________________ LEFT LOWER EXTREMITY Common Femoral Vein: No evidence of thrombus. Normal compressibility, respiratory phasicity and response to augmentation. Saphenofemoral Junction: No evidence of thrombus. Normal compressibility and flow on color Doppler imaging. Profunda Femoral Vein: No evidence of thrombus. Normal compressibility  and flow on color Doppler imaging. Femoral Vein: No evidence of thrombus. Normal compressibility, respiratory phasicity and response to augmentation. Popliteal Vein: No evidence of thrombus. Normal compressibility, respiratory phasicity and response to augmentation. Calf Veins: No evidence of acute or chronic DVT with special attention paid to the left peroneal vein normal compressibility and flow on color Doppler imaging. Superficial Great Saphenous Vein: No evidence of thrombus. Normal compressibility. Other Findings:  None. IMPRESSION: No evidence of acute or chronic DVT within either lower extremity with special attention paid to the right popliteal and tibial veins as well as the left peroneal vein. Electronically Signed   By: Sandi Mariscal M.D.   On: 12/02/2021 10:46   DG Shoulder Left  Result Date: 12/02/2021 CLINICAL DATA:  Left shoulder pain. EXAM: LEFT SHOULDER - 2+ VIEW COMPARISON:  Left shoulder x-rays dated June 12, 2011. FINDINGS: No acute fracture or dislocation. Joint spaces are preserved. Bone mineralization is normal. Nonspecific soft tissue swelling around the left shoulder. IMPRESSION: 1. Nonspecific left shoulder soft tissue swelling. No acute osseous abnormality. Electronically Signed   By: Titus Dubin M.D.   On: 12/02/2021 09:19    DG FLUORO GUIDED NEEDLE PLC ASPIRATION/INJECTION LOC  Result Date: 12/02/2021 CLINICAL DATA:  Myositis of the left thigh. Suspicion for left hip infection. EXAM: LEFT HIP INJECTION UNDER FLUOROSCOPY COMPARISON:  None. FLUOROSCOPY: Radiation Exposure Index (as provided by the fluoroscopic device): 0.1 mGy mGy Kerma PROCEDURE: After informed consent was obtained explaining the risks, benefits, and possible complications of the procedure, the patient was placed supine on the fluoroscopy table. A formal time procedure was performed according to department of protocol. The left hip was prepped and draped in the usual sterile fashion. 1% lidocaine was used to anesthetize the skin. A 20 gauge spinal needle was advanced into the joint space under fluoroscopic guidance. 3 mL of straw-colored synovial fluid was aspirated from the left hip joint space. The fluid was sent to cytology for evaluation. Hemostasis was achieved. Patient tolerated the procedure well. There were no immediate complications. IMPRESSION: 1. Successful left hip aspiration yielding 3 mL of straw-colored synovial fluid. Technically successful  hip injection under fluoroscopy. Electronically Signed   By: Kathreen Devoid M.D.   On: 12/02/2021 11:13    Assessment/Plan:     Principal Problem:   Acute metabolic encephalopathy Active Problems:   Non-ST elevation (NSTEMI) myocardial infarction (HCC)   Cocaine abuse (HCC)   Cocaine poisoning ([place])   Rhabdomyolysis   ATN (acute tubular necrosis) (HCC)   Hyperkalemia   Myositis   Acute hepatitis   Acute renal failure (HCC)   Elevated LFTs   Hypermagnesemia   Infection  Continue IV antibiotics per infectious disease.  No orthopedic surgical intervention for septic left hip or knee joint planned as there is no definitive evidence for septic joints at this time.    Thornton Park , MD 12/03/2021, 2:55 PM

## 2021-12-03 NOTE — Progress Notes (Signed)
Subjective:  Patient seen in his ICU room.  Family members at the bedside.  Patient still complains of left lower extremity pain.  Objective:   VITALS:   Vitals:   12/03/21 1400 12/03/21 1600 12/03/21 1630 12/03/21 1700  BP: 134/81 134/87    Pulse: 74 72 81 73  Resp: 10 15 16 15   Temp:    98.3 F (36.8 C)  TempSrc:    Oral  SpO2: 99% 100% 100% 100%  Weight:      Height:        PHYSICAL EXAM: Left lower extremity: Left hip: No erythema, ecchymosis or swelling.  Thigh compartment is soft and compressible.  Focal swelling in the mid third of the left thigh is still moderately tender.  Left knee: Patient has slight ecchymosis around the aspiration site.  He has a small effusion.  There is no erythema.  Bandage over the aspiration site remains in place.  Distally he is neurovascular intact with intact motor function.   LABS  Results for orders placed or performed during the hospital encounter of 12/01/21 (from the past 24 hour(s))  Heparin level (unfractionated)     Status: Abnormal   Collection Time: 12/02/21  7:51 PM  Result Value Ref Range   Heparin Unfractionated 0.11 (L) 0.30 - 0.70 IU/mL  Vancomycin, random     Status: None   Collection Time: 12/02/21  7:51 PM  Result Value Ref Range   Vancomycin Rm 23   Comprehensive metabolic panel     Status: Abnormal   Collection Time: 12/03/21  4:55 AM  Result Value Ref Range   Sodium 132 (L) 135 - 145 mmol/L   Potassium 4.5 3.5 - 5.1 mmol/L   Chloride 105 98 - 111 mmol/L   CO2 17 (L) 22 - 32 mmol/L   Glucose, Bld 110 (H) 70 - 99 mg/dL   BUN 76 (H) 6 - 20 mg/dL   Creatinine, Ser 6.10 (H) 0.61 - 1.24 mg/dL   Calcium 6.9 (L) 8.9 - 10.3 mg/dL   Total Protein 5.3 (L) 6.5 - 8.1 g/dL   Albumin 2.2 (L) 3.5 - 5.0 g/dL   AST 456 (H) 15 - 41 U/L   ALT 648 (H) 0 - 44 U/L   Alkaline Phosphatase 63 38 - 126 U/L   Total Bilirubin 0.5 0.3 - 1.2 mg/dL   GFR, Estimated 11 (L) >60 mL/min   Anion gap 10 5 - 15  CK     Status: Abnormal    Collection Time: 12/03/21  4:55 AM  Result Value Ref Range   Total CK 11,555 (H) 49 - 397 U/L  Procalcitonin     Status: None   Collection Time: 12/03/21  4:55 AM  Result Value Ref Range   Procalcitonin 6.85 ng/mL  CBC     Status: Abnormal   Collection Time: 12/03/21  4:55 AM  Result Value Ref Range   WBC 13.1 (H) 4.0 - 10.5 K/uL   RBC 3.89 (L) 4.22 - 5.81 MIL/uL   Hemoglobin 12.4 (L) 13.0 - 17.0 g/dL   HCT 34.8 (L) 39.0 - 52.0 %   MCV 89.5 80.0 - 100.0 fL   MCH 31.9 26.0 - 34.0 pg   MCHC 35.6 30.0 - 36.0 g/dL   RDW 12.4 11.5 - 15.5 %   Platelets 205 150 - 400 K/uL   nRBC 0.0 0.0 - 0.2 %  Phosphorus     Status: Abnormal   Collection Time: 12/03/21  4:55 AM  Result Value  Ref Range   Phosphorus 6.3 (H) 2.5 - 4.6 mg/dL  Magnesium     Status: None   Collection Time: 12/03/21  4:55 AM  Result Value Ref Range   Magnesium 2.0 1.7 - 2.4 mg/dL  Heparin level (unfractionated)     Status: Abnormal   Collection Time: 12/03/21  4:55 AM  Result Value Ref Range   Heparin Unfractionated 0.19 (L) 0.30 - 0.70 IU/mL    CT FEMUR LEFT WO CONTRAST  Addendum Date: 12/01/2021   ADDENDUM REPORT: 12/01/2021 19:43 ADDENDUM: Discussed with PA Ouma over the telephone at 7:42 p.m. on 12/01/2021 with read back. Electronically Signed   By: Ofilia Neas M.D.   On: 12/01/2021 19:43   Result Date: 12/01/2021 CLINICAL DATA:  Cellulitis, swelling EXAM: CT OF THE LOWER LEFT EXTREMITY WITHOUT CONTRAST TECHNIQUE: Multidetector CT imaging of the lower left extremity was performed according to the standard protocol. RADIATION DOSE REDUCTION: This exam was performed according to the departmental dose-optimization program which includes automated exposure control, adjustment of the mA and/or kV according to patient size and/or use of iterative reconstruction technique. COMPARISON:  None. FINDINGS: Bones/Joint/Cartilage No acute fracture identified. No suspicious bony lesions. No bone destruction or periosteal reaction.  Appears to be a small knee joint effusion and hip joint effusion. Ligaments Suboptimally assessed by CT. Muscles and Tendons Diffuse marked muscular edema and swelling visualized throughout the entire quadriceps muscle more prominent laterally, with accompanying perimuscular and intramuscular septal/fascial fluid/edema which is more prominent proximally. There is a smaller approximally 3.2 x 2.4 cm focal mild hypodense area identified in the mid biceps femoris muscle which may also represent edema/inflammation. Similar muscular edema and adjacent fat stranding of the muscles surrounding the left hip. No foci of air identified within the muscle or fascial planes. Soft tissues Mild subcutaneous fat stranding edema in the lateral aspect of the thigh. IMPRESSION: 1. Findings suggestive of extensive myositis involving the muscles about the left hip and most severely the quadriceps muscles, with accompanying fasciitis which is more prominent proximally. Limited evaluation without contrast. No foci of muscular or fascial gas identified, however necrotizing fasciitis can not be excluded as gas is not always seen. Clinical correlation recommended. 2. Smaller more focal hypodensity in the mid biceps femoris muscle which may also represent a smaller area of edema/inflammation. 3. Small hip joint and knee joint effusions. Septic joints can not be excluded. Will contact and notify the ordering provider. Electronically Signed: By: Ofilia Neas M.D. On: 12/01/2021 19:30   MR FEMUR LEFT W WO CONTRAST  Result Date: 12/02/2021 CLINICAL DATA:  Soft tissue infection suspected. History of polysubstance abuse. Acute metabolic encephalopathy. EXAM: MR OF THE LEFT LOWER EXTREMITY WITHOUT AND WITH CONTRAST TECHNIQUE: Multiplanar, multisequence MR imaging of the left thigh was performed both before and after administration of intravenous contrast. Exam was somewhat limited due to patient's ability to stay still due to encephalopathy.  CONTRAST:  38mL GADAVIST GADOBUTROL 1 MMOL/ML IV SOLN COMPARISON:  None. FINDINGS: Bones/Joint/Cartilage No evidence of acute fracture or dislocation. Evaluation is however somewhat limited due to significant motion and T2 weighted sequences. Ligaments No significant abnormality Muscles and Tendons There is muscle edema of the lateral thigh muscles involving the rectus lateralis. On post contrast sequence there is decreased perfusion with peripheral enhancement (series 25, image [id number]). There is a small area of decreased perfusion with peripheral enhancement in the biceps femoris muscle on axial post contrast sequence (series 25 images 56-64) concerning for developing abscess. There is also  similar focus of decreased perfusion with peripheral enhancement in the lateral aspect of the gluteus maximus (images 14-18) Soft tissues Marked subcutaneous soft tissue edema about lateral aspect of the thigh without evidence of drainable fluid collection or abscess. IMPRESSION: 1. Well-defined area of decreased perfusion with peripheral enhancement in the gluteus maximus, rectus lateralis as well as in the biceps femoris muscle concerning for myositis/developing abscess. Clinical correlation and short-term follow-up with CT with contrast or sonogram would be helpful. 2. Marked subcutaneous soft tissue edema about the lateral aspect of the thigh. Electronically Signed   By: Larose Hires D.O.   On: 12/02/2021 19:38   US Venous Img Lower Bilateral (DVT)  Result Date: 12/02/2021 CLINICAL DATA:  Bilateral lower extremity pain and edema. 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 Evaluate for acute or chronic DVT. EXAM: BILATERAL LOWER EXTREMITY VENOUS DOPPLER ULTRASOUND TECHNIQUE: Gray-scale sonography with graded compression, as well as color Doppler and duplex ultrasound were performed to evaluate the lower extremity deep venous systems from the level of the common femoral vein and including the common femoral,  femoral, profunda femoral, popliteal and calf veins including the posterior tibial, peroneal and gastrocnemius veins when visible. The superficial great saphenous vein was also interrogated. Spectral Doppler was utilized to evaluate flow at rest and with distal augmentation maneuvers in the common femoral, femoral and popliteal veins. COMPARISON:  Bilateral lower extremity venous upper ultrasound 02/26/2014 (positive for DVT involving the right popliteal and tibial veins as well as the left peroneal vein) FINDINGS: RIGHT LOWER EXTREMITY Common Femoral Vein: No evidence of thrombus. Normal compressibility, respiratory phasicity and response to augmentation. Saphenofemoral Junction: No evidence of thrombus. Normal compressibility and flow on color Doppler imaging. Profunda Femoral Vein: No evidence of thrombus. Normal compressibility and flow on color Doppler imaging. Femoral Vein: No evidence of thrombus. Normal compressibility, respiratory phasicity and response to augmentation. Popliteal Vein: No evidence of acute or chronic thrombus. Normal compressibility, respiratory phasicity and response to augmentation. Calf Veins: No evidence of acute or chronic thrombus. Normal compressibility and flow on color Doppler imaging. Superficial Great Saphenous Vein: No evidence of thrombus. Normal compressibility. Other Findings:  None. _________________________________________________________ LEFT LOWER EXTREMITY Common Femoral Vein: No evidence of thrombus. Normal compressibility, respiratory phasicity and response to augmentation. Saphenofemoral Junction: No evidence of thrombus. Normal compressibility and flow on color Doppler imaging. Profunda Femoral Vein: No evidence of thrombus. Normal compressibility and flow on color Doppler imaging. Femoral Vein: No evidence of thrombus. Normal compressibility, respiratory phasicity and response to augmentation. Popliteal Vein: No evidence of thrombus. Normal compressibility,  respiratory phasicity and response to augmentation. Calf Veins: No evidence of acute or chronic DVT with special attention paid to the left peroneal vein normal compressibility and flow on color Doppler imaging. Superficial Great Saphenous Vein: No evidence of thrombus. Normal compressibility. Other Findings:  None. IMPRESSION: No evidence of acute or chronic DVT within either lower extremity with special attention paid to the right popliteal and tibial veins as well as the left peroneal vein. Electronically Signed   By: Simonne Come M.D.   On: 12/02/2021 10:46   DG Shoulder Left  Result Date: 12/02/2021 CLINICAL DATA:  Left shoulder pain. EXAM: LEFT SHOULDER - 2+ VIEW COMPARISON:  Left shoulder x-rays dated June 12, 2011. FINDINGS: No acute fracture or dislocation. Joint spaces are preserved. Bone mineralization is normal. Nonspecific soft tissue swelling around the left shoulder. IMPRESSION: 1. Nonspecific left shoulder soft tissue swelling. No acute osseous abnormality. Electronically Signed   By:  Titus Dubin M.D.   On: 12/02/2021 09:19   DG FLUORO GUIDED NEEDLE PLC ASPIRATION/INJECTION LOC  Result Date: 12/02/2021 CLINICAL DATA:  Myositis of the left thigh. Suspicion for left hip infection. EXAM: LEFT HIP INJECTION UNDER FLUOROSCOPY COMPARISON:  None. FLUOROSCOPY: Radiation Exposure Index (as provided by the fluoroscopic device): 0.1 mGy mGy Kerma PROCEDURE: After informed consent was obtained explaining the risks, benefits, and possible complications of the procedure, the patient was placed supine on the fluoroscopy table. A formal time procedure was performed according to department of protocol. The left hip was prepped and draped in the usual sterile fashion. 1% lidocaine was used to anesthetize the skin. A 20 gauge spinal needle was advanced into the joint space under fluoroscopic guidance. 3 mL of straw-colored synovial fluid was aspirated from the left hip joint space. The fluid was sent to  cytology for evaluation. Hemostasis was achieved. Patient tolerated the procedure well. There were no immediate complications. IMPRESSION: 1. Successful left hip aspiration yielding 3 mL of straw-colored synovial fluid. Technically successful  hip injection under fluoroscopy. Electronically Signed   By: Kathreen Devoid M.D.   On: 12/02/2021 11:13    Assessment/Plan:     Principal Problem:   Acute metabolic encephalopathy Active Problems:   Non-ST elevation (NSTEMI) myocardial infarction (HCC)   Cocaine abuse (HCC)   Cocaine poisoning ([place])   Rhabdomyolysis   ATN (acute tubular necrosis) (HCC)   Hyperkalemia   Myositis   Acute hepatitis   Acute renal failure (HCC)   Elevated LFTs   Hypermagnesemia   Infection  Patient's MRI of the left femur shows no focal abscess.  There is no culture growth on hip or knee aspiration.  Blood cultures are negative to date.  Patient is afebrile.  White count is improving, down to 13.1 today.  No definitive evidence for septic left hip or knee joint at this time.  No plan for surgical intervention at this time.  Total CK is now 11,000 down from 19,000 yesterday.  Continue IV antibiotics per infectious disease.  No orthopedic surgical intervention for septic left hip or knee joint planned as there is no evidence for septic joints at this time.    Thornton Park , MD 12/03/2021, 6:24 PM

## 2021-12-04 DIAGNOSIS — M60852 Other myositis, left thigh: Secondary | ICD-10-CM | POA: Diagnosis not present

## 2021-12-04 DIAGNOSIS — G9341 Metabolic encephalopathy: Secondary | ICD-10-CM | POA: Diagnosis not present

## 2021-12-04 DIAGNOSIS — M6282 Rhabdomyolysis: Secondary | ICD-10-CM | POA: Diagnosis not present

## 2021-12-04 DIAGNOSIS — B182 Chronic viral hepatitis C: Secondary | ICD-10-CM

## 2021-12-04 DIAGNOSIS — M60009 Infective myositis, unspecified site: Secondary | ICD-10-CM

## 2021-12-04 LAB — COMPREHENSIVE METABOLIC PANEL
ALT: 553 U/L — ABNORMAL HIGH (ref 0–44)
AST: 292 U/L — ABNORMAL HIGH (ref 15–41)
Albumin: 2.2 g/dL — ABNORMAL LOW (ref 3.5–5.0)
Alkaline Phosphatase: 61 U/L (ref 38–126)
Anion gap: 9 (ref 5–15)
BUN: 77 mg/dL — ABNORMAL HIGH (ref 6–20)
CO2: 19 mmol/L — ABNORMAL LOW (ref 22–32)
Calcium: 7.5 mg/dL — ABNORMAL LOW (ref 8.9–10.3)
Chloride: 105 mmol/L (ref 98–111)
Creatinine, Ser: 7.07 mg/dL — ABNORMAL HIGH (ref 0.61–1.24)
GFR, Estimated: 10 mL/min — ABNORMAL LOW (ref >60)
Glucose, Bld: 101 mg/dL — ABNORMAL HIGH (ref 70–99)
Potassium: 4.4 mmol/L (ref 3.5–5.1)
Sodium: 133 mmol/L — ABNORMAL LOW (ref 135–145)
Total Bilirubin: 0.7 mg/dL (ref 0.3–1.2)
Total Protein: 5.6 g/dL — ABNORMAL LOW (ref 6.5–8.1)

## 2021-12-04 LAB — HCV RNA QUANT
HCV Quantitative Log: 2.954 log10 IU/mL (ref >1.70)
HCV Quantitative: 900 IU/mL (ref >50)

## 2021-12-04 LAB — CK: Total CK: 5948 U/L — ABNORMAL HIGH (ref 49–397)

## 2021-12-04 MED ORDER — HYDROMORPHONE HCL 2 MG PO TABS
1.0000 mg | ORAL_TABLET | ORAL | Status: DC | PRN
Start: 2021-12-04 — End: 2021-12-09
  Administered 2021-12-04 – 2021-12-09 (×35): 1 mg via ORAL
  Filled 2021-12-04 (×35): qty 1

## 2021-12-04 MED ORDER — SODIUM CHLORIDE 0.9 % IV SOLN
2.0000 g | INTRAVENOUS | Status: DC
  Administered 2021-12-04 – 2021-12-06 (×3): 2 g via INTRAVENOUS
  Filled 2021-12-04 (×3): qty 2
  Filled 2021-12-04: qty 20

## 2021-12-04 MED ORDER — SODIUM CHLORIDE 0.9 % IV SOLN: INTRAVENOUS | Status: DC | PRN

## 2021-12-04 MED ORDER — METRONIDAZOLE 500 MG/100ML IV SOLN
500.0000 mg | Freq: Two times a day (BID) | INTRAVENOUS | Status: AC
  Administered 2021-12-04 – 2021-12-08 (×9): 500 mg via INTRAVENOUS
  Filled 2021-12-04 (×10): qty 100

## 2021-12-04 NOTE — Progress Notes (Signed)
Date of Admission:  12/01/2021    ID: Edwin [name] is a 36 y.o. male Principal Problem:   Acute metabolic encephalopathy Active Problems:   Non-ST elevation (NSTEMI) myocardial infarction ([place])   Cocaine abuse (HCC)   Cocaine poisoning ([place])   Rhabdomyolysis   ATN (acute tubular necrosis) (HCC)   Hyperkalemia   Myositis   Acute hepatitis   Acute renal failure (HCC)   Elevated LFTs   Hypermagnesemia   Infection   Chronic hepatitis C without hepatic coma (HCC)    Subjective: Out of ICU Alert Some pain left thigh Overall feeling a little better Hearing better Able to move his left leg a little  Medications:   Chlorhexidine Gluconate Cloth  6 each Topical Daily   heparin injection (subcutaneous)  5,000 Units Subcutaneous Q8H   isosorbide mononitrate  15 mg Oral Daily   pantoprazole  40 mg Oral Daily    Objective: Vital signs in last 24 hours: Patient Vitals for the past 24 hrs:  BP Temp Temp src Pulse Resp SpO2 Height Weight  12/04/21 1212 (!) 146/96 (!) 97.5 F (36.4 C) Oral 73 19 99 % -- --  12/04/21 0732 (!) 141/88 97.6 F (36.4 C) Oral 71 16 100 % -- --  12/04/21 0518 140/83 97.9 F (36.6 C) Oral 74 18 100 % -- --  12/03/21 2052 (!) 150/95 (!) 97.5 F (36.4 C) Oral 79 20 100 % 5\' 8"  (1.727 m) 97.8 kg  12/03/21 1953 (!) 145/87 98.3 F (36.8 C) Oral 81 14 100 % -- --    PHYSICAL EXAM:  General:  alert, no distress, Lungs: Clear to auscultation bilaterally. No Wheezing or Rhonchi. No rales. Heart: Regular rate and rhythm, no murmur, rub or gallop. Abdomen: Soft, non-tender,not distended. Bowel sounds normal. No masses Extremities: Left arm deltoid area has a circular erythema with pustular pinpoint lesions.  Also on his chest there is acneiform eruption  Left thigh swollen and less  tender.  Left knee area less swollen as well. Lymph: Cervical, supraclavicular normal. Neurologic: Grossly non-focal  Lab Results CBC Latest Ref Rng & Units 12/03/2021 12/02/2021  12/01/2021  WBC 4.0 - 10.5 K/uL 13.1(H) 18.3(H) 21.4(H)  Hemoglobin 13.0 - 17.0 g/dL 12.4(L) 15.0 15.9  Hematocrit 39.0 - 52.0 % 34.8(L) 43.9 47.3  Platelets 150 - 400 K/uL 205 237 329    CMP Latest Ref Rng & Units 12/04/2021 12/03/2021 12/02/2021  Glucose 70 - 99 mg/dL 101(H) 110(H) 111(H)  BUN 6 - 20 mg/dL 77(H) 76(H) 65(H)  Creatinine 0.61 - 1.24 mg/dL 7.07(H) 6.10(H) 4.76(H)  Sodium 135 - 145 mmol/L 133(L) 132(L) 132(L)  Potassium 3.5 - 5.1 mmol/L 4.4 4.5 4.3  Chloride 98 - 111 mmol/L 105 105 101  CO2 22 - 32 mmol/L 19(L) 17(L) 19(L)  Calcium 8.9 - 10.3 mg/dL 7.5(L) 6.9(L) 6.9(L)  Total Protein 6.5 - 8.1 g/dL 5.6(L) 5.3(L) 5.7(L)  Total Bilirubin 0.3 - 1.2 mg/dL 0.7 0.5 0.6  Alkaline Phos 38 - 126 U/L 61 63 80  AST 15 - 41 U/L 292(H) 456(H) 711(H)  ALT 0 - 44 U/L 553(H) 648(H) 634(H)     Sedimentation Rate No results for input(s): ESRSEDRATE in the last 72 hours. C-Reactive Protein No results for input(s): CRP in the last 72 hours.  Microbiology:  Studies/Results: MR FEMUR LEFT W WO CONTRAST  Result Date: 12/02/2021 CLINICAL DATA:  Soft tissue infection suspected. History of polysubstance abuse. Acute metabolic encephalopathy. EXAM: MR OF THE LEFT LOWER EXTREMITY WITHOUT AND WITH  CONTRAST TECHNIQUE: Multiplanar, multisequence MR imaging of the left thigh was performed both before and after administration of intravenous contrast. Exam was somewhat limited due to patient's ability to stay still due to encephalopathy. CONTRAST:  52mL GADAVIST GADOBUTROL 1 MMOL/ML IV SOLN COMPARISON:  None. FINDINGS: Bones/Joint/Cartilage No evidence of acute fracture or dislocation. Evaluation is however somewhat limited due to significant motion and T2 weighted sequences. Ligaments No significant abnormality Muscles and Tendons There is muscle edema of the lateral thigh muscles involving the rectus lateralis. On post contrast sequence there is decreased perfusion with peripheral enhancement (series 25, image  [id number]). There is a small area of decreased perfusion with peripheral enhancement in the biceps femoris muscle on axial post contrast sequence (series 25 images 56-64) concerning for developing abscess. There is also similar focus of decreased perfusion with peripheral enhancement in the lateral aspect of the gluteus maximus (images 14-18) Soft tissues Marked subcutaneous soft tissue edema about lateral aspect of the thigh without evidence of drainable fluid collection or abscess. IMPRESSION: 1. Well-defined area of decreased perfusion with peripheral enhancement in the gluteus maximus, rectus lateralis as well as in the biceps femoris muscle concerning for myositis/developing abscess. Clinical correlation and short-term follow-up with CT with contrast or sonogram would be helpful. 2. Marked subcutaneous soft tissue edema about the lateral aspect of the thigh. Electronically Signed   By: Keane Police D.O.   On: 12/02/2021 19:38     Assessment/Plan:  Acute  encephalopathy secondary to cocaine and benzo use with metabolic derangement including acute kidney injury.  Much better  Severe rhabdomyolysis related to cocaine and benzo use On IV fluids No evidence of necrotizing fasciitis of the thigh, no evidence of necrosis of muscles- no crepitus- no reason to suspect gas gangrene Zosyn changed to ceftriaxone /flagyl today Polysubstance use  Aspiration of the left knee joint and the hip joint does not favor septic arthritis.  AKI: Worsening creatinine but patient's output is increased and his CK is much better Followed by renal, no need for dialysis yet  Hyperkalemia due to rhabdo has resolved  Transaminitis secondary to rhabdomyolysis improving  Hepatitis C antibody reactive.  Hep C RNA low level  Discussed the management with the patientt

## 2021-12-04 NOTE — Progress Notes (Signed)
PROGRESS NOTE  Edwin [name]    DOB: [birth year], 36 y.o.  BQ:[id number]  PCP: Pcp, No   Code Status: Full Code   DOA: 12/01/2021   LOS: 3  Brief Narrative of Current Hospitalization  Edwin [name] is a 36 y.o. male with a PMH significant for polysubstance abuse. They presented from home to the ED on 12/01/2021 with AMS.  In the ED, it was found that they had acute metabolic encephalopathy in the setting of multiple severe metabolic derangements (AKI with Hyperkalemia, Rhabdomyolysis, liver failure) plus cocaine use.  Also found to have elevated troponin. Originally admitted to [facility]. Nephrology, ortho surgery, general surgery and Cardiology following.  12/04/21 -stable  Assessment & Plan  Principal Problem:   Acute metabolic encephalopathy Active Problems:   Non-ST elevation (NSTEMI) myocardial infarction (HCC)   Cocaine abuse (HCC)   Cocaine poisoning ([place])   Rhabdomyolysis   ATN (acute tubular necrosis) (HCC)   Hyperkalemia   Myositis   Acute hepatitis   Acute renal failure (HCC)   Elevated LFTs   Hypermagnesemia   Infection  Severe Left thigh myositis with accompanying proximal fasciitis  Left hip and Knee Joint Effusions concerning for Septic joints - ID following - discontinued vancomycin - continue zosyn - follow cultures -ortho surgery following  -  s/p joint aspiration not consistent with septic joint - analgesia PRN  Sudden onset Bilateral hearing loss- improving -Likely drug induced (?speed-balling) in a patient with significant history of IV drug use. -ENT f/u OP  Acute Metabolic Encephalopathy in setting of multiple metabolic derangements & cocaine use ~ IMPROVING. CT head negative -Provide supportive care -Treat metabolic derangements as below -UDS + for cocaine and Benzodiazepines -Serum tylenol, salicylates, & Ethyl alcohol all negative - psychiatry evaluated   Acute Kidney Injury- Cr 3.28>>4.76>6.10>7.07 today. Potentially due to antibiotic coverage.   Rhabdomyolysis- CK from >50,00 to 11,555> 5,948. Will continue to follow as kidney function has continued to worsen Severe Hyperkalemia ~ RESOLVED Hyponatremia- stable at 133 -Monitor I&O's / urinary output -Follow BMP -Ensure adequate renal perfusion -Avoid nephrotoxic agents as able -Replace electrolytes as indicated -IV fluids (NS increased to 175 cc/hr on 2/2) -Nephrology following, appreciate input -Trend CK   Elevated Troponin, demand ischemia vs NSTEMI in setting of Cocaine abuse Prolonged QTc, suspect due to Hyperkalemia +/- drug abuse -cardiology signed off 2/3 - imdur 15mg  daily - completed heparin x48 hours  - Continuous cardiac monitoring -Maintain MAP >65 -IV fluids -Vasopressors as needed to maintain MAP goal   Hepatitis C- likely chronic. Ab positive, viral quant is 900 - f/u OP ID for treatment  DVT prophylaxis: sq heparin   Diet:  Diet Orders (From admission, onward)     Start     Ordered   12/01/21 1040  Diet regular Room service appropriate? Yes; Fluid consistency: Thin  Diet effective now       Question Answer Comment  Room service appropriate? Yes   Fluid consistency: Thin      12/01/21 1039            Subjective 12/04/21    Pt reports overall pain that is well controlled with medication but breakthrough between doses. Otherwise doing well.   Disposition Plan & Communication  Patient status: Inpatient  Admitted From: Home Disposition: Home Anticipated discharge date: TBD, based on kidney function recovery  Family Communication: none  Consults, Procedures, Significant Events  Consultants:  ID Ortho [facility]CM Nephrology Cardiology   Procedures/significant events:  Joint aspiration  Antimicrobials:  Anti-infectives (  From admission, onward)    Start     Dose/Rate Route Frequency Ordered Stop   12/03/21 2200  piperacillin-tazobactam (ZOSYN) IVPB 3.375 g        3.375 g 12.5 mL/hr over 240 Minutes Intravenous Every 12 hours 12/03/21  1324     12/01/21 2200  clindamycin (CLEOCIN) IVPB 600 mg  Status:  Discontinued        600 mg 100 mL/hr over 30 Minutes Intravenous Every 8 hours 12/01/21 2010 12/03/21 1320   12/01/21 2130  piperacillin-tazobactam (ZOSYN) IVPB 3.375 g  Status:  Discontinued        3.375 g 12.5 mL/hr over 240 Minutes Intravenous Every 8 hours 12/01/21 2041 12/03/21 1324   12/01/21 2115  vancomycin (VANCOREADY) IVPB 2000 mg/400 mL        2,000 mg 200 mL/hr over 120 Minutes Intravenous  Once 12/01/21 2022 12/02/21 0059   12/01/21 2042  vancomycin variable dose per unstable renal function (pharmacist dosing)  Status:  Discontinued         Does not apply See admin instructions 12/01/21 2042 12/02/21 2341   12/01/21 2030  ceFEPIme (MAXIPIME) 2 g in sodium chloride 0.9 % 100 mL IVPB  Status:  Discontinued        2 g 200 mL/hr over 30 Minutes Intravenous Every 12 hours 12/01/21 2022 12/01/21 2030       Objective   Vitals:   12/03/21 1700 12/03/21 1953 12/03/21 2052 12/04/21 0518  BP:  (!) 145/87 (!) 150/95 140/83  Pulse: 73 81 79 74  Resp: 15 14 20 18   Temp: 98.3 F (36.8 C) 98.3 F (36.8 C) (!) 97.5 F (36.4 C) 97.9 F (36.6 C)  TempSrc: Oral Oral Oral Oral  SpO2: 100% 100% 100% 100%  Weight:   97.8 kg   Height:   5\' 8"  (1.727 m)     Intake/Output Summary (Last 24 hours) at 12/04/2021 0718 Last data filed at 12/04/2021 0421 Gross per 24 hour  Intake 2912.25 ml  Output 1625 ml  Net 1287.25 ml    Filed Weights   12/02/21 0500 12/03/21 0138 12/03/21 2052  Weight: 85.9 kg 90 kg 97.8 kg    Patient BMI: Body mass index is 32.78 kg/m.   Physical Exam:  General: awake, alert, NAD HEENT: atraumatic, clear conjunctiva, anicteric sclera, MMM, hearing grossly normal Respiratory: normal respiratory effort. Cardiovascular: quick capillary refill  Nervous: A&O x3. Extremities: moves all equally, no edema, normal tone Skin: dry, intact, normal temperature, normal color. No rashes, lesions or ulcers on  exposed skin Psychiatry: flat mood, congruent affect  Labs   I have personally reviewed following labs and imaging studies CBC    Component Value Date/Time   WBC 13.1 (H) 12/03/2021 0455   RBC 3.89 (L) 12/03/2021 0455   HGB 12.4 (L) 12/03/2021 0455   HCT 34.8 (L) 12/03/2021 0455   PLT 205 12/03/2021 0455   MCV 89.5 12/03/2021 0455   MCH 31.9 12/03/2021 0455   MCHC 35.6 12/03/2021 0455   RDW 12.4 12/03/2021 0455   LYMPHSABS 1.1 12/01/2021 0607   MONOABS 1.3 (H) 12/01/2021 0607   EOSABS 0.0 12/01/2021 0607   BASOSABS 0.1 12/01/2021 0607   BMP Latest Ref Rng & Units 12/03/2021 12/02/2021 12/01/2021  Glucose 70 - 99 mg/dL 110(H) 111(H) 97  BUN 6 - 20 mg/dL 76(H) 65(H) 59(H)  Creatinine 0.61 - 1.24 mg/dL 6.10(H) 4.76(H) 4.03(H)  Sodium 135 - 145 mmol/L 132(L) 132(L) 131(L)  Potassium 3.5 - 5.1 mmol/L  4.5 4.3 4.6  Chloride 98 - 111 mmol/L 105 101 101  CO2 22 - 32 mmol/L 17(L) 19(L) 19(L)  Calcium 8.9 - 10.3 mg/dL 6.9(L) 6.9(L) 6.7(L)   Imaging Studies  MR FEMUR LEFT W WO CONTRAST  Result Date: 12/02/2021 CLINICAL DATA:  Soft tissue infection suspected. History of polysubstance abuse. Acute metabolic encephalopathy. EXAM: MR OF THE LEFT LOWER EXTREMITY WITHOUT AND WITH CONTRAST TECHNIQUE: Multiplanar, multisequence MR imaging of the left thigh was performed both before and after administration of intravenous contrast. Exam was somewhat limited due to patient's ability to stay still due to encephalopathy. CONTRAST:  33mL GADAVIST GADOBUTROL 1 MMOL/ML IV SOLN COMPARISON:  None. FINDINGS: Bones/Joint/Cartilage No evidence of acute fracture or dislocation. Evaluation is however somewhat limited due to significant motion and T2 weighted sequences. Ligaments No significant abnormality Muscles and Tendons There is muscle edema of the lateral thigh muscles involving the rectus lateralis. On post contrast sequence there is decreased perfusion with peripheral enhancement (series 25, image 57-62). There is  a small area of decreased perfusion with peripheral enhancement in the biceps femoris muscle on axial post contrast sequence (series 25 images 56-64) concerning for developing abscess. There is also similar focus of decreased perfusion with peripheral enhancement in the lateral aspect of the gluteus maximus (images 14-18) Soft tissues Marked subcutaneous soft tissue edema about lateral aspect of the thigh without evidence of drainable fluid collection or abscess. IMPRESSION: 1. Well-defined area of decreased perfusion with peripheral enhancement in the gluteus maximus, rectus lateralis as well as in the biceps femoris muscle concerning for myositis/developing abscess. Clinical correlation and short-term follow-up with CT with contrast or sonogram would be helpful. 2. Marked subcutaneous soft tissue edema about the lateral aspect of the thigh. Electronically Signed   By: Keane Police D.O.   On: 12/02/2021 19:38   US Venous Img Lower Bilateral (DVT)  Result Date: 12/02/2021 CLINICAL DATA:  Bilateral lower extremity pain and edema. 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 Evaluate for acute or chronic DVT. EXAM: BILATERAL LOWER EXTREMITY VENOUS DOPPLER ULTRASOUND TECHNIQUE: Gray-scale sonography with graded compression, as well as color Doppler and duplex ultrasound were performed to evaluate the lower extremity deep venous systems from the level of the common femoral vein and including the common femoral, femoral, profunda femoral, popliteal and calf veins including the posterior tibial, peroneal and gastrocnemius veins when visible. The superficial great saphenous vein was also interrogated. Spectral Doppler was utilized to evaluate flow at rest and with distal augmentation maneuvers in the common femoral, femoral and popliteal veins. COMPARISON:  Bilateral lower extremity venous upper ultrasound 02/26/2014 (positive for DVT involving the right popliteal and tibial veins as well as the left peroneal  vein) FINDINGS: RIGHT LOWER EXTREMITY Common Femoral Vein: No evidence of thrombus. Normal compressibility, respiratory phasicity and response to augmentation. Saphenofemoral Junction: No evidence of thrombus. Normal compressibility and flow on color Doppler imaging. Profunda Femoral Vein: No evidence of thrombus. Normal compressibility and flow on color Doppler imaging. Femoral Vein: No evidence of thrombus. Normal compressibility, respiratory phasicity and response to augmentation. Popliteal Vein: No evidence of acute or chronic thrombus. Normal compressibility, respiratory phasicity and response to augmentation. Calf Veins: No evidence of acute or chronic thrombus. Normal compressibility and flow on color Doppler imaging. Superficial Great Saphenous Vein: No evidence of thrombus. Normal compressibility. Other Findings:  None. _________________________________________________________ LEFT LOWER EXTREMITY Common Femoral Vein: No evidence of thrombus. Normal compressibility, respiratory phasicity and response to augmentation. Saphenofemoral Junction: No evidence of thrombus.  Normal compressibility and flow on color Doppler imaging. Profunda Femoral Vein: No evidence of thrombus. Normal compressibility and flow on color Doppler imaging. Femoral Vein: No evidence of thrombus. Normal compressibility, respiratory phasicity and response to augmentation. Popliteal Vein: No evidence of thrombus. Normal compressibility, respiratory phasicity and response to augmentation. Calf Veins: No evidence of acute or chronic DVT with special attention paid to the left peroneal vein normal compressibility and flow on color Doppler imaging. Superficial Great Saphenous Vein: No evidence of thrombus. Normal compressibility. Other Findings:  None. IMPRESSION: No evidence of acute or chronic DVT within either lower extremity with special attention paid to the right popliteal and tibial veins as well as the left peroneal vein. Electronically  Signed   By: Sandi Mariscal M.D.   On: 12/02/2021 10:46   DG Shoulder Left  Result Date: 12/02/2021 CLINICAL DATA:  Left shoulder pain. EXAM: LEFT SHOULDER - 2+ VIEW COMPARISON:  Left shoulder x-rays dated June 12, 2011. FINDINGS: No acute fracture or dislocation. Joint spaces are preserved. Bone mineralization is normal. Nonspecific soft tissue swelling around the left shoulder. IMPRESSION: 1. Nonspecific left shoulder soft tissue swelling. No acute osseous abnormality. Electronically Signed   By: Titus Dubin M.D.   On: 12/02/2021 09:19   DG FLUORO GUIDED NEEDLE PLC ASPIRATION/INJECTION LOC  Result Date: 12/02/2021 CLINICAL DATA:  Myositis of the left thigh. Suspicion for left hip infection. EXAM: LEFT HIP INJECTION UNDER FLUOROSCOPY COMPARISON:  None. FLUOROSCOPY: Radiation Exposure Index (as provided by the fluoroscopic device): 0.1 mGy mGy Kerma PROCEDURE: After informed consent was obtained explaining the risks, benefits, and possible complications of the procedure, the patient was placed supine on the fluoroscopy table. A formal time procedure was performed according to department of protocol. The left hip was prepped and draped in the usual sterile fashion. 1% lidocaine was used to anesthetize the skin. A 20 gauge spinal needle was advanced into the joint space under fluoroscopic guidance. 3 mL of straw-colored synovial fluid was aspirated from the left hip joint space. The fluid was sent to cytology for evaluation. Hemostasis was achieved. Patient tolerated the procedure well. There were no immediate complications. IMPRESSION: 1. Successful left hip aspiration yielding 3 mL of straw-colored synovial fluid. Technically successful  hip injection under fluoroscopy. Electronically Signed   By: Kathreen Devoid M.D.   On: 12/02/2021 11:13    Medications   Scheduled Meds:  Chlorhexidine Gluconate Cloth  6 each Topical Daily   heparin injection (subcutaneous)  5,000 Units Subcutaneous Q8H   isosorbide  mononitrate  15 mg Oral Daily   pantoprazole  40 mg Oral Daily   No recently discontinued medications to reconcile  LOS: 3 days   Richarda Osmond, DO [facility] 12/04/2021, 7:18 AM   Available by Epic secure chat 7AM-7PM. If 7PM-7AM, please contact night-coverage Refer to [email or web address] to contact the [facility] Attending or Consulting provider for this pt

## 2021-12-04 NOTE — Progress Notes (Signed)
[facility]  PROGRESS NOTE   Subjective:   Patient seen at bedside.  Not in acute distress.  Objective:  Vital signs in last 24 hours:  Temp:  [97.5 F (36.4 C)-98.3 F (36.8 C)] 97.5 F (36.4 C) (02/04 1212) Pulse Rate:  [71-81] 73 (02/04 1212) Resp:  [10-20] 19 (02/04 1212) BP: (134-150)/(81-96) 146/96 (02/04 1212) SpO2:  [99 %-100 %] 99 % (02/04 1212) Weight:  [97.8 kg] 97.8 kg (02/03 2052)  Weight change: 7.8 kg Filed Weights   12/02/21 0500 12/03/21 0138 12/03/21 2052  Weight: 85.9 kg 90 kg 97.8 kg    Intake/Output: I/O last 3 completed shifts: In: 4510.4 [P.O.:240; I.V.:3966.4; IV Piggyback:304.1] Out: 2025 [Urine:2025]   Intake/Output this shift:  Total I/O In: -  Out: 2300 [Urine:2300]  Physical Exam: General:  No acute distress  Head:  Normocephalic, atraumatic. Moist oral mucosal membranes  Eyes:  Anicteric  Neck:  Supple  Lungs:   Clear to auscultation, normal effort  Heart:  S1S2 no rubs  Abdomen:   Soft, nontender, bowel sounds present  Extremities:  peripheral edema.  Neurologic:  Awake, alert, following commands  Skin:  No lesions  Access:     Basic Metabolic Panel: Recent Labs  Lab 12/01/21 0607 12/01/21 1432 12/01/21 2147 12/02/21 0855 12/03/21 0455 12/04/21 0558  NA 133* 131* 131* 132* 132* 133*  K 6.6* 5.1 4.6 4.3 4.5 4.4  CL 96* 101 101 101 105 105  CO2 22 22 19* 19* 17* 19*  GLUCOSE 210* 110* 97 111* 110* 101*  BUN 46* 53* 59* 65* 76* 77*  CREATININE 3.28* 3.53* 4.03* 4.76* 6.10* 7.07*  CALCIUM 7.3* 6.8* 6.7* 6.9* 6.9* 7.5*  MG 2.6*  --   --   --  2.0  --   PHOS  --   --   --   --  6.3*  --     CBC: Recent Labs  Lab 12/01/21 0607 12/02/21 0855 12/03/21 0455  WBC 21.4* 18.3* 13.1*  NEUTROABS 18.7*  --   --   HGB 15.9 15.0 12.4*  HCT 47.3 43.9 34.8*  MCV 92.2 90.3 89.5  PLT 329 237 205     Urinalysis: No results for input(s): COLORURINE, LABSPEC, PHURINE, GLUCOSEU, HGBUR, BILIRUBINUR, KETONESUR,  PROTEINUR, UROBILINOGEN, NITRITE, LEUKOCYTESUR in the last 72 hours.  Invalid input(s): APPERANCEUR    Imaging: MR FEMUR LEFT W WO CONTRAST  Result Date: 12/02/2021 CLINICAL DATA:  Soft tissue infection suspected. History of polysubstance abuse. Acute metabolic encephalopathy. EXAM: MR OF THE LEFT LOWER EXTREMITY WITHOUT AND WITH CONTRAST TECHNIQUE: Multiplanar, multisequence MR imaging of the left thigh was performed both before and after administration of intravenous contrast. Exam was somewhat limited due to patient's ability to stay still due to encephalopathy. CONTRAST:  76mL GADAVIST GADOBUTROL 1 MMOL/ML IV SOLN COMPARISON:  None. FINDINGS: Bones/Joint/Cartilage No evidence of acute fracture or dislocation. Evaluation is however somewhat limited due to significant motion and T2 weighted sequences. Ligaments No significant abnormality Muscles and Tendons There is muscle edema of the lateral thigh muscles involving the rectus lateralis. On post contrast sequence there is decreased perfusion with peripheral enhancement (series 25, image 57-62). There is a small area of decreased perfusion with peripheral enhancement in the biceps femoris muscle on axial post contrast sequence (series 25 images 56-64) concerning for developing abscess. There is also similar focus of decreased perfusion with peripheral enhancement in the lateral aspect of the gluteus maximus (images 14-18) Soft tissues Marked subcutaneous soft tissue edema  about lateral aspect of the thigh without evidence of drainable fluid collection or abscess. IMPRESSION: 1. Well-defined area of decreased perfusion with peripheral enhancement in the gluteus maximus, rectus lateralis as well as in the biceps femoris muscle concerning for myositis/developing abscess. Clinical correlation and short-term follow-up with CT with contrast or sonogram would be helpful. 2. Marked subcutaneous soft tissue edema about the lateral aspect of the thigh. Electronically  Signed   By: Keane Police D.O.   On: 12/02/2021 19:38     Medications:    sodium chloride 10 mL/hr at 12/04/21 1247   cefTRIAXone (ROCEPHIN)  IV 2 g (12/04/21 1250)   lactated ringers 100 mL/hr at 12/04/21 0607    Chlorhexidine Gluconate Cloth  6 each Topical Daily   heparin injection (subcutaneous)  5,000 Units Subcutaneous Q8H   isosorbide mononitrate  15 mg Oral Daily   pantoprazole  40 mg Oral Daily    Assessment/ Plan:     Principal Problem:   Acute metabolic encephalopathy Active Problems:   Non-ST elevation (NSTEMI) myocardial infarction (HCC)   Cocaine abuse (HCC)   Cocaine poisoning ([place])   Rhabdomyolysis   ATN (acute tubular necrosis) (HCC)   Hyperkalemia   Myositis   Acute hepatitis   Acute renal failure (HCC)   Elevated LFTs   Hypermagnesemia   Infection   Chronic hepatitis C without hepatic coma ([place])  36 year old male with history of drug abuse now admitted with mental status changes.  He was found to have non-ST elevated MI, severe rhabdomyolysis and acute kidney injury.  Patient also had hyperkalemia which is improving.  Patient's renal indicis are worsening.  Urine output has been improving.  We will continue IV LR at the present rate.  We will continue to monitor closely.  No acute need for dialysis.   LOS: [place], [facility] 2/4/20231:56 PM

## 2021-12-05 LAB — COMPREHENSIVE METABOLIC PANEL
ALT: 369 U/L — ABNORMAL HIGH (ref 0–44)
AST: 185 U/L — ABNORMAL HIGH (ref 15–41)
Albumin: 2.1 g/dL — ABNORMAL LOW (ref 3.5–5.0)
Alkaline Phosphatase: 47 U/L (ref 38–126)
Anion gap: 9 (ref 5–15)
BUN: 77 mg/dL — ABNORMAL HIGH (ref 6–20)
CO2: 17 mmol/L — ABNORMAL LOW (ref 22–32)
Calcium: 7.5 mg/dL — ABNORMAL LOW (ref 8.9–10.3)
Chloride: 109 mmol/L (ref 98–111)
Creatinine, Ser: 7.7 mg/dL — ABNORMAL HIGH (ref 0.61–1.24)
GFR, Estimated: 9 mL/min — ABNORMAL LOW (ref >60)
Glucose, Bld: 95 mg/dL (ref 70–99)
Potassium: 4.7 mmol/L (ref 3.5–5.1)
Sodium: 135 mmol/L (ref 135–145)
Total Bilirubin: 0.5 mg/dL (ref 0.3–1.2)
Total Protein: 5.2 g/dL — ABNORMAL LOW (ref 6.5–8.1)

## 2021-12-05 LAB — MAGNESIUM: Magnesium: 2 mg/dL (ref 1.7–2.4)

## 2021-12-05 LAB — PHOSPHORUS: Phosphorus: 6.6 mg/dL — ABNORMAL HIGH (ref 2.5–4.6)

## 2021-12-05 NOTE — NC FL2 (Signed)
[place] LEVEL OF CARE SCREENING TOOL     IDENTIFICATION  Patient Name: Edwin [name] Birthdate: 1986-08-12 Sex: male Admission Date (Current Location): 12/01/2021  [place] and [place] Number:  Engineering geologist and Address:  [facility], [address] [place], [place] [address]      Provider Number: [id number]  Attending Physician Name and Address:  Richarda Osmond, MD  Relative Name and Phone Number:       Current Level of Care: Hospital Recommended Level of Care: [place] Prior Approval Number:    Date Approved/Denied:   PASRR Number: [id number]  Discharge Plan:      Current Diagnoses: Patient Active Problem List   Diagnosis Date Noted   Chronic hepatitis C without hepatic coma (HCC)    Acute hepatitis    Acute renal failure (HCC)    Elevated LFTs    Hypermagnesemia    Infection    Cocaine poisoning ([place]) 12/02/2021   Rhabdomyolysis 12/02/2021   ATN (acute tubular necrosis) ([place]) 12/02/2021   Hyperkalemia 12/02/2021   Myositis [id number]   Acute metabolic encephalopathy [id number]   Non-ST elevation (NSTEMI) myocardial infarction ([place])    Cocaine abuse ([place])     Orientation RESPIRATION BLADDER Height & Weight     Self, Time, Situation, Place  Normal Continent Weight: 215 lb 9.8 oz (97.8 kg) Height:  5\' 8"  (172.7 cm)  BEHAVIORAL SYMPTOMS/MOOD NEUROLOGICAL BOWEL NUTRITION STATUS      Continent Diet (regular)  AMBULATORY STATUS COMMUNICATION OF NEEDS Skin   Limited Assist Verbally Other (Comment) (puncture L knee & thigh)                       Personal Care Assistance Level of Assistance  Bathing, Feeding, Dressing Bathing Assistance: Limited assistance Feeding assistance: Limited assistance Dressing Assistance: Limited assistance     Functional Limitations Info             SPECIAL CARE FACTORS FREQUENCY  PT (By licensed PT), OT (By licensed OT)     PT Frequency: 5 times  per week OT Frequency: 5 times per week            Contractures      Additional Factors Info  Code Status, Allergies Code Status Info: full Allergies Info: nka           Current Medications (12/05/2021):  This is the current hospital active medication list Current Facility-Administered Medications  Medication Dose Route Frequency Provider Last Rate Last Admin   0.9 %  sodium chloride infusion   Intravenous PRN Richarda Osmond, MD   Stopped at 12/05/21 1134   cefTRIAXone (ROCEPHIN) 2 g in sodium chloride 0.9 % 100 mL IVPB  2 g Intravenous Q24H Tsosie Billing, MD   Stopped at 12/05/21 1106   Chlorhexidine Gluconate Cloth 2 % PADS 6 each  6 each Topical Daily Flora Lipps, MD   6 each at 12/05/21 0849   diazepam (VALIUM) injection 2.5 mg  2.5 mg Intravenous Q6H PRN Flora Lipps, MD   2.5 mg at 12/05/21 1602   haloperidol lactate (HALDOL) injection 2 mg  2 mg Intravenous Q6H PRN Clapacs, Madie Reno, MD       heparin injection 5,000 Units  5,000 Units Subcutaneous Q8H Richarda Osmond, MD   5,000 Units at 12/05/21 1434   HYDROmorphone (DILAUDID) tablet 1 mg  1 mg Oral Q3H PRN Richarda Osmond, MD   1 mg at 12/05/21  1434   isosorbide mononitrate (IMDUR) 24 hr tablet 15 mg  15 mg Oral Daily Kate Sable, MD   15 mg at 12/05/21 0849   lactated ringers infusion   Intravenous Continuous Lyla Son, MD 125 mL/hr at 12/05/21 1611 New Bag at 12/05/21 1611   metroNIDAZOLE (FLAGYL) IVPB 500 mg  500 mg Intravenous Q12H Tsosie Billing, MD   Stopped at 12/05/21 0955   ondansetron (ZOFRAN) injection 4 mg  4 mg Intravenous Q6H PRN Richarda Osmond, MD   4 mg at 12/03/21 0928   pantoprazole (PROTONIX) EC tablet 40 mg  40 mg Oral Daily Benita Gutter, RPH   40 mg at 12/05/21 0849   polyethylene glycol (MIRALAX / GLYCOLAX) packet 17 g  17 g Oral Daily PRN Bradly Bienenstock, NP         Discharge Medications: Please see discharge summary for a list of discharge  medications.  Relevant Imaging Results:  Relevant Lab Results:   Additional Information SS #: [id number]  [place], LCSW

## 2021-12-05 NOTE — Progress Notes (Signed)
[facility]  PROGRESS NOTE   Subjective:   Patient denies any chest pain, shortness of breath. Still unable to ambulate because of his weakness in the lower extremities.  Objective:  Vital signs in last 24 hours:  Temp:  [97.8 F (36.6 C)-98.1 F (36.7 C)] 97.8 F (36.6 C) (02/05 1221) Pulse Rate:  [73-76] 73 (02/05 1221) Resp:  [16-18] 16 (02/05 1221) BP: (142-149)/(73-89) 142/73 (02/05 1221) SpO2:  [98 %-100 %] 100 % (02/05 1221)  Weight change:  Filed Weights   12/02/21 0500 12/03/21 0138 12/03/21 2052  Weight: 85.9 kg 90 kg 97.8 kg    Intake/Output: I/O last 3 completed shifts: In: 2095.5 [I.V.:1850; IV Piggyback:245.5] Out: [id number] [Urine:4925]   Intake/Output this shift:  Total I/O In: 1243.8 [P.O.:120; I.V.:923.8; IV Piggyback:200] Out: 550 [Urine:550]  Physical Exam: General:  No acute distress  Head:  Normocephalic, atraumatic. Moist oral mucosal membranes  Eyes:  Anicteric  Neck:  Supple  Lungs:   Clear to auscultation, normal effort  Heart:  S1S2 no rubs  Abdomen:   Soft, nontender, bowel sounds present  Extremities:  peripheral edema.  Neurologic:  Awake, alert, following commands  Skin:  No lesions  Access:     Basic Metabolic Panel: Recent Labs  Lab 12/01/21 0607 12/01/21 1432 12/01/21 2147 12/02/21 0855 12/03/21 0455 12/04/21 0558 12/05/21 0406  NA 133*   < > 131* 132* 132* 133* 135  K 6.6*   < > 4.6 4.3 4.5 4.4 4.7  CL 96*   < > 101 101 105 105 109  CO2 22   < > 19* 19* 17* 19* 17*  GLUCOSE 210*   < > 97 111* 110* 101* 95  BUN 46*   < > 59* 65* 76* 77* 77*  CREATININE 3.28*   < > 4.03* 4.76* 6.10* 7.07* 7.70*  CALCIUM 7.3*   < > 6.7* 6.9* 6.9* 7.5* 7.5*  MG 2.6*  --   --   --  2.0  --  2.0  PHOS  --   --   --   --  6.3*  --  6.6*   < > = values in this interval not displayed.    CBC: Recent Labs  Lab 12/01/21 0607 12/02/21 0855 12/03/21 0455  WBC 21.4* 18.3* 13.1*  NEUTROABS 18.7*  --   --   HGB 15.9 15.0 12.4*   HCT 47.3 43.9 34.8*  MCV 92.2 90.3 89.5  PLT 329 237 205     Urinalysis: No results for input(s): COLORURINE, LABSPEC, PHURINE, GLUCOSEU, HGBUR, BILIRUBINUR, KETONESUR, PROTEINUR, UROBILINOGEN, NITRITE, LEUKOCYTESUR in the last 72 hours.  Invalid input(s): APPERANCEUR    Imaging: No results found.   Medications:    sodium chloride Stopped (12/05/21 1134)   cefTRIAXone (ROCEPHIN)  IV Stopped (12/05/21 1106)   lactated ringers 125 mL/hr at 12/05/21 1136   metronidazole Stopped (12/05/21 0955)    Chlorhexidine Gluconate Cloth  6 each Topical Daily   heparin injection (subcutaneous)  5,000 Units Subcutaneous Q8H   isosorbide mononitrate  15 mg Oral Daily   pantoprazole  40 mg Oral Daily    Assessment/ Plan:     Principal Problem:   Acute metabolic encephalopathy Active Problems:   Non-ST elevation (NSTEMI) myocardial infarction (HCC)   Cocaine abuse (HCC)   Cocaine poisoning ([place])   Rhabdomyolysis   ATN (acute tubular necrosis) (HCC)   Hyperkalemia   Myositis   Acute hepatitis   Acute renal failure (HCC)   Elevated LFTs  Hypermagnesemia   Infection   Chronic hepatitis C without hepatic coma ([place])  36 year old male with history of drug abuse admitted for mental status changes.  He was found to have non-ST elevated MI, severe rhabdomyolysis and acute kidney injury.  #1: Acute kidney injury: Patient has worsening renal insufficiency at this time.  Urine output has been excellent.  We will continue to monitor until tomorrow and decide on the need for renal replacement therapy.  I like to continue the LR and increase to 125 cc an hour.  Patient is advised to increase his fluid intake.  We will monitor urine output closely.  Dose all the medications for a GFR of less than 20 cc/min.  #2: Rhabdomyolysis/severe myositis: Patient CPK has improved significantly.  He has severe myositis and is being followed by physical therapy.   #3: Metabolic encephalopathy: This was  secondary to drug overdose and drug abuse.  We will continue to monitor labs.   LOS: [place], [facility] 2/5/20231:54 PM

## 2021-12-05 NOTE — TOC Initial Note (Signed)
Transition of Care [facility]) - Initial/Assessment Note    Patient Details  Name: Edwin [name] MRN: [id number] Date of Birth: 1986/08/30  [facility] CM/SW Contact:    Edwin Masson, RN Phone Number: [phone] 12/05/2021, 3:41 PM  Clinical Narrative:                 Several recommendations present to address with this pt. Substance Abuse-information provider, order requested Advance Directive and primary POA contact-Chaplain [phone] services will address Monday morning. Pt has also been recommended for SNF.  RN spoke with pt on recommendation for SNF. Pt receptive and requested RN to speak with his father Edwin [name] concerning placement for SNF level of care. Pt receptive to placement and agreed to any facility in the area/county.  Pt provided SS# for work up on a  PASRR and FL2 required for SNF placement. Father reports pt lives in a house with other non-family members. Pt states he is able to obtain his medications and has sufficient transportation for appointments and able to obtain his medications.   TOC will completed bed search and await bed offers for placement.  Expected Discharge Plan: [place] Barriers to Discharge: Continued Medical Work up   Patient Goals and CMS Choice     Choice offered to / list presented to : Patient, Parent  Expected Discharge Plan and Services Expected Discharge Plan: [place]   Discharge Planning Services: CM Consult Post Acute Care Choice: [place] Living arrangements for the past 2 months: Single Family Home                                      Prior Living Arrangements/Services Living arrangements for the past 2 months: Single Family Home Lives with:: Other (Comment), Self Patient language and need for interpreter reviewed:: Yes Do you feel safe going back to the place where you live?: Yes      Need for Family Participation in Patient Care: Yes  (Comment) Care giver support system in place?: Yes (comment)      Activities of Daily Living Home Assistive Devices/Equipment: None ADL Screening (condition at time of admission) Patient's cognitive ability adequate to safely complete daily activities?: No Is the patient deaf or have difficulty hearing?: Yes Does the patient have difficulty seeing, even when wearing glasses/contacts?: No Does the patient have difficulty concentrating, remembering, or making decisions?: Yes Patient able to express need for assistance with ADLs?: Yes Does the patient have difficulty dressing or bathing?: Yes Independently performs ADLs?: No Communication: Needs assistance Is this a change from baseline?: Change from baseline, expected to last <3 days Dressing (OT): Needs assistance Is this a change from baseline?: Change from baseline, expected to last <3days Grooming: Needs assistance Is this a change from baseline?: Change from baseline, expected to last <3 days Feeding: Needs assistance Is this a change from baseline?: Change from baseline, expected to last <3 days Bathing: Needs assistance Is this a change from baseline?: Change from baseline, expected to last <3 days Toileting: Needs assistance Is this a change from baseline?: Change from baseline, expected to last <3 days In/Out Bed: Needs assistance Is this a change from baseline?: Change from baseline, expected to last <3 days Walks in Home: Needs assistance Is this a change from baseline?: Change from baseline, expected to last <3 days Does the patient have difficulty walking or climbing stairs?: Yes Weakness of Legs:  Both Weakness of Arms/Hands: Both  Permission Sought/Granted Permission sought to share information with : Case Manager Permission granted to share information with : Yes, Verbal Permission Granted           Permission granted to share info w Contact Information: Edwin [name] (Father)  Emotional  Assessment Appearance:: Appears older than stated age Attitude/Demeanor/Rapport: Engaged Affect (typically observed): Accepting Orientation: : Oriented to Self, Oriented to Place, Oriented to  Time   Psych Involvement: No (comment)  Admission diagnosis:  Hyperkalemia [E87.5] Hypermagnesemia [E83.41] Leukocytosis [D72.829] Acute hepatitis [B17.9] Elevated LFTs [R79.89] Acute renal failure, unspecified acute renal failure type ([place]) 123456 Acute metabolic encephalopathy [id number] Patient Active Problem List   Diagnosis Date Noted   Chronic hepatitis C without hepatic coma (HCC)    Acute hepatitis    Acute renal failure (HCC)    Elevated LFTs    Hypermagnesemia    Infection    Cocaine poisoning ([place]) 12/02/2021   Rhabdomyolysis 12/02/2021   ATN (acute tubular necrosis) ([place]) 12/02/2021   Hyperkalemia 12/02/2021   Myositis [id number]   Acute metabolic encephalopathy [id number]   Non-ST elevation (NSTEMI) myocardial infarction ([place])    Cocaine abuse ([place])    PCP:  Pcp, No Pharmacy:  No Pharmacies Listed    Social Determinants of Health (SDOH) Interventions    Readmission Risk Interventions No flowsheet data found.

## 2021-12-05 NOTE — Evaluation (Signed)
Occupational Therapy Evaluation Patient Details Name: Edwin [name] MRN: [id number] DOB: 02-Apr-1986 Today's Date: 12/05/2021   History of Present Illness Pt is a 36 y/o M admitted on 12/01/21 after presenting to the ED with AMS. Pt was found to have acute metabolic encephalopathy in the setting of multiple severe metabolic derangements (AKI with Hyperkalemia, Rhabdomyolysis, liver failure) plus cocaine use.  Also found to have elevated troponin. Pt found to have Severe Left thigh myositis with accompanying proximal fasciitis, Left hip and Knee Joint Effusions concerning for Septic joints with ID & ortho surgery following. No orthopedic surgical intervention for septic left hip or knee joint planned as there is no evidence for septic joints at this time. PMH: polysubstance abuse   Clinical Impression   Pt presents with generalized weakness, reduced endurance, limited engagement, impaired balance, and L LE pain. Pt reports he does not have a stable home, rather he moves frequently from place to place. He does have family in the area but says he can no longer stay with them, as they are "sick of my behavior." Prior to admission, pt was IND in fxl mobility, ambulating without AD. He no longer has a car so is not driving currently. Engaged pt in discussion of harm reduction strategies, but pt stated he felt little to no self-efficacy to cut back on his substance use. He states that only time he has detoxed was during incarceration; he endorses decades-long use of heroin, cocaine, and other substances. During today's evaluation, he presents with bilateral LE weakness and significant pain in L LE, impacting his ability to stand, transfer, and ambulate. At this point pt is unable to perform ADL/IADL without assistance, and would have to recommend STR if pt were to DC today. Anticipate, however, that if pain in L LE improves, pt will be able to DC without assistance required, as he returns to his PLOF and IND in fxl  mobility.     Recommendations for follow up therapy are one component of a multi-disciplinary discharge planning process, led by the attending physician.  Recommendations may be updated based on patient status, additional functional criteria and insurance authorization.   Follow Up Recommendations  Skilled nursing-short term rehab (<3 hours/day)    Assistance Recommended at Discharge Frequent or constant Supervision/Assistance  Patient can return home with the following A little help with walking and/or transfers;A little help with bathing/dressing/bathroom    Functional Status Assessment  Patient has had a recent decline in their functional status and demonstrates the ability to make significant improvements in function in a reasonable and predictable amount of time.  Equipment Recommendations  None recommended by OT    Recommendations for Other Services Other (comment) (referrals re: community assistance programs, housing, tx for SUD)     Precautions / Restrictions Precautions Precautions: Fall Restrictions Weight Bearing Restrictions: Yes RLE Weight Bearing: Weight bearing as tolerated LLE Weight Bearing: Weight bearing as tolerated      Mobility Bed Mobility Overal bed mobility: Needs Assistance Bed Mobility: Sit to Supine       Sit to supine: Min assist   General bed mobility comments: support for moving LLE, raising LLE to position with pillows underneath    Transfers Overall transfer level: Needs assistance Equipment used: Rolling walker (2 wheels) Transfers: Sit to/from Stand, Bed to chair/wheelchair/BSC Sit to Stand: Min guard     Step pivot transfers: Min guard     General transfer comment: Requires increased time/effort, close SUPV for safety. Pt complains of increased pain with  movement      Balance Overall balance assessment: Needs assistance Sitting-balance support: Feet supported Sitting balance-Leahy Scale: Good     Standing balance support:  During functional activity, Bilateral upper extremity supported, Reliant on assistive device for balance Standing balance-Leahy Scale: Fair Standing balance comment: heavy use of BUE support 2/2 pain                           ADL either performed or assessed with clinical judgement   ADL Overall ADL's : Needs assistance/impaired Eating/Feeding: Modified independent Eating/Feeding Details (indicate cue type and reason): reports little interest in food, requires encouragement to eat Grooming: Sitting;Set up;Wash/dry hands;Wash/dry face;Supervision/safety               Lower Body Dressing: Maximal assistance Lower Body Dressing Details (indicate cue type and reason): donning socks                     Vision Patient Visual Report: No change from baseline       Perception     Praxis      Pertinent Vitals/Pain Pain Assessment Pain Assessment: 0-10 Pain Score: 8  Pain Location: L knee Pain Descriptors / Indicators: Grimacing, Guarding, Aching Pain Intervention(s): Limited activity within patient's tolerance, Repositioned, Monitored during session     Hand Dominance     Extremity/Trunk Assessment Upper Extremity Assessment Upper Extremity Assessment: Overall WFL for tasks assessed   Lower Extremity Assessment Lower Extremity Assessment: Generalized weakness   Cervical / Trunk Assessment Cervical / Trunk Assessment: Normal   Communication Communication Communication: No difficulties   Cognition Arousal/Alertness: Awake/alert Behavior During Therapy: WFL for tasks assessed/performed Overall Cognitive Status: Within Functional Limits for tasks assessed                                 General Comments: Oriented to place, time, situation. Requires slightly increased processing time. HOH     General Comments  Pt with continent BM on toilet, performing peri hygiene independently.    Exercises Other Exercises Other Exercises: Educ re:  importance of OOB mobility, harm reduction strategies to address SUD   Shoulder Instructions      Home Living Family/patient expects to be discharged to:: Shelter/Homeless Living Arrangements: Alone                                      Prior Functioning/Environment Prior Level of Function : Independent/Modified Independent             Mobility Comments: Pt reports independence with mobility without AD, denies falls. ADLs Comments: IND in ADL. does not drive        OT Problem List: Decreased strength;Impaired balance (sitting and/or standing);Pain;Decreased range of motion;Decreased activity tolerance      OT Treatment/Interventions:      OT Goals(Current goals can be found in the care plan section) Acute Rehab OT Goals Patient Stated Goal: to not be in pain OT Goal Formulation: With patient Time For Goal Achievement: 12/19/21 Potential to Achieve Goals: Good ADL Goals Pt Will Perform Lower Body Dressing: Independently;sit to/from stand;sitting/lateral leans Pt/caregiver will Perform Home Exercise Program: Increased ROM;Increased strength;Independently (to build strength, endurance, and for stress management) Additional ADL Goal #1: Pt will be able to identify 2+ harm reducation strategies  OT Frequency:  Co-evaluation              AM-PAC OT "6 Clicks" Daily Activity     Outcome Measure Help from another person eating meals?: None Help from another person taking care of personal grooming?: A Little Help from another person toileting, which includes using toliet, bedpan, or urinal?: A Lot Help from another person bathing (including washing, rinsing, drying)?: A Little Help from another person to put on and taking off regular upper body clothing?: None Help from another person to put on and taking off regular lower body clothing?: A Lot 6 Click Score: 18   End of Session Equipment Utilized During Treatment: Rolling walker (2 wheels)  Activity  Tolerance: Patient limited by pain Patient left: in bed;with call bell/phone within reach;with bed alarm set  OT Visit Diagnosis: Unsteadiness on feet (R26.81);Muscle weakness (generalized) (M62.81);Pain Pain - Right/Left: Left Pain - part of body: Leg                Time: [id number] OT Time Calculation (min): 23 min Charges:  OT General Charges $OT Visit: 1 Visit OT Evaluation $OT Eval Moderate Complexity: 1 Mod OT Treatments $Self Care/Home Management : 8-22 mins Josiah Lobo, PhD, MS, OTR/L 12/05/21, 3:20 PM

## 2021-12-05 NOTE — Evaluation (Signed)
Physical Therapy Evaluation Patient Details Name: Edwin [name] MRN: [id number] DOB: [birth year] Today's Date: 12/05/2021  History of Present Illness  Pt is a 36 y/o M admitted on 12/01/21 after presenting to the ED with AMS. Pt was found to have acute metabolic encephalopathy in the setting of multiple severe metabolic derangements (AKI with Hyperkalemia, Rhabdomyolysis, liver failure) plus cocaine use.  Also found to have elevated troponin. Pt found to have Severe Left thigh myositis with accompanying proximal fasciitis, Left hip and Knee Joint Effusions concerning for Septic joints with ID & ortho surgery following. No orthopedic surgical intervention for septic left hip or knee joint planned as there is no evidence for septic joints at this time. PMH: polysubstance abuse  Clinical Impression  Pt seen for PT evaluation with pt requiring encouragement/cuing for increased alertness & participation. Pt endorses homelessness prior to admission but also states he was independent without AD for all mobility. On this date, pt is extremely limited by BLE (L>R) pain & LLE appears to be more edematous compared to RLE. Pt requires min assist with significantly extra time & reliance on hospital bed features to complete supine>sit & min assist fade to supervision for sit>stand from elevated EOB & from low toilet (with use of grab bar). Pt is able to ambulate bed>toilet>recliner with RW & CGA fade to supervision with cuing for hand placement & posture with significant reliance on BUE to decrease weight bearing through RLE as pt with significant pain in BLE. At this time pt is unsafe to d/c alone & would benefit from continued skilled PT treatment to address balance, endurance, strengthening & gait with LRAD. Encouraged pt to ambulate to bathroom as much as possible with nursing staff while in hospital.        Recommendations for follow up therapy are one component of a multi-disciplinary discharge planning process, led  by the attending physician.  Recommendations may be updated based on patient status, additional functional criteria and insurance authorization.  Follow Up Recommendations Skilled nursing-short term rehab (<3 hours/day)    Assistance Recommended at Discharge Frequent or constant Supervision/Assistance  Patient can return home with the following  A little help with walking and/or transfers;A little help with bathing/dressing/bathroom;Assistance with cooking/housework;Assist for transportation;Help with stairs or ramp for entrance;Direct supervision/assist for medications management    Equipment Recommendations Rolling walker (2 wheels);BSC/3in1  Recommendations for Other Services       Functional Status Assessment Patient has had a recent decline in their functional status and demonstrates the ability to make significant improvements in function in a reasonable and predictable amount of time.     Precautions / Restrictions Precautions Precautions: Fall Restrictions Weight Bearing Restrictions: Yes RLE Weight Bearing: Weight bearing as tolerated LLE Weight Bearing: Weight bearing as tolerated      Mobility  Bed Mobility Overal bed mobility: Needs Assistance Bed Mobility: Supine to Sit     Supine to sit: Min assist, HOB elevated     General bed mobility comments: support for LLE to move to EOB, use of HOB elevated & bed rails, extra time & frequent rest breaks 2/2 pain    Transfers Overall transfer level: Needs assistance Equipment used: Rolling walker (2 wheels) Transfers: Sit to/from Stand Sit to Stand: Min assist, Supervision           General transfer comment: initial min assist then fade to supervision, instructional cuing for hand placement, pt completes sit>stand from significantly elevated EOB & low toilet    Ambulation/Gait Ambulation/Gait assistance: Min  guard, Supervision Gait Distance (Feet):  (7 ft + 7 ft) Assistive device: Rolling walker (2  wheels) Gait Pattern/deviations: Decreased step length - right, Decreased step length - left, Decreased stride length, Decreased weight shift to left, Trunk flexed Gait velocity: decreased     General Gait Details: Heavy reliance on RW with BUE 2/2 BLE (L>R) pain, cuing for hand placement & upright posture to increase ease of stepping.  Stairs            Wheelchair Mobility    Modified Rankin (Stroke Patients Only)       Balance Overall balance assessment: Needs assistance Sitting-balance support: Feet supported Sitting balance-Leahy Scale: Good     Standing balance support: During functional activity, Bilateral upper extremity supported, Reliant on assistive device for balance Standing balance-Leahy Scale: Fair Standing balance comment: requires BUE support 2/2 pain; pt able to stand at sink to perform hand hygiene with supervision (cuing to square up to sink with RW for safety)                             Pertinent Vitals/Pain Pain Assessment Pain Assessment: Faces Faces Pain Scale: Hurts whole lot Pain Location: L knee Pain Descriptors / Indicators: Discomfort, Grimacing Pain Intervention(s): Limited activity within patient's tolerance, Monitored during session, Repositioned    Home Living Family/patient expects to be discharged to:: Unsure (pt endorses homelessness) Living Arrangements: Alone                      Prior Function Prior Level of Function : Independent/Modified Independent             Mobility Comments: Pt reports independence with mobility without AD, denies falls.       Hand Dominance        Extremity/Trunk Assessment   Upper Extremity Assessment Upper Extremity Assessment: Overall WFL for tasks assessed    Lower Extremity Assessment Lower Extremity Assessment:  (entire LLE appears edematous compared to RLE, bruising to L knee, ROM limited by pain - no formal assessment completed)    Cervical / Trunk  Assessment Cervical / Trunk Assessment: Normal  Communication   Communication: No difficulties  Cognition Arousal/Alertness: Awake/alert Behavior During Therapy: Flat affect Overall Cognitive Status: Within Functional Limits for tasks assessed                                 General Comments: Received asleep, requires encouragement/cuing to increase alertness        General Comments General comments (skin integrity, edema, etc.): Pt with continent BM on toilet, performing peri hygiene independently.    Exercises     Assessment/Plan    PT Assessment Patient needs continued PT services  PT Problem List Decreased strength;Decreased mobility;Decreased range of motion;Decreased coordination;Decreased activity tolerance;Pain;Decreased knowledge of use of DME;Decreased balance       PT Treatment Interventions DME instruction;Therapeutic exercise;Gait training;Balance training;Stair training;Neuromuscular re-education;Functional mobility training;Therapeutic activities;Patient/family education;Modalities;Manual techniques    PT Goals (Current goals can be found in the Care Plan section)  Acute Rehab PT Goals Patient Stated Goal: decreased pain PT Goal Formulation: With patient Time For Goal Achievement: 12/19/21 Potential to Achieve Goals: Good    Frequency 7X/week     Co-evaluation               AM-PAC PT "6 Clicks" Mobility  Outcome Measure Help needed turning  from your back to your side while in a flat bed without using bedrails?: A Little Help needed moving from lying on your back to sitting on the side of a flat bed without using bedrails?: A Little Help needed moving to and from a bed to a chair (including a wheelchair)?: A Little Help needed standing up from a chair using your arms (e.g., wheelchair or bedside chair)?: A Little Help needed to walk in hospital room?: A Little Help needed climbing 3-5 steps with a railing? : A Lot 6 Click Score:  17    End of Session Equipment Utilized During Treatment: Gait belt Activity Tolerance: Patient limited by fatigue;Patient limited by pain Patient left: in chair;with nursing/sitter in room;with call bell/phone within reach Nurse Communication: Mobility status PT Visit Diagnosis: Difficulty in walking, not elsewhere classified (R26.2);Pain Pain - Right/Left:  (bilateral) Pain - part of body: Leg;Knee    Time: 1151-1210 PT Time Calculation (min) (ACUTE ONLY): 19 min   Charges:   PT Evaluation $PT Eval Moderate Complexity: [place], PT, DPT 12/05/21, 1:51 PM   Waunita Schooner 12/05/2021, 1:49 PM

## 2021-12-05 NOTE — Progress Notes (Signed)
PROGRESS NOTE  Edwin [name]    DOB: 04/25/1986, 36 y.o.  [id number]  PCP: Pcp, No   Code Status: Full Code   DOA: 12/01/2021   LOS: 4  Brief Narrative of Current Hospitalization  Edwin [name] is a 36 y.o. male with a PMH significant for polysubstance abuse. They presented from home to the ED on 12/01/2021 with AMS.  In the ED, it was found that they had acute metabolic encephalopathy in the setting of multiple severe metabolic derangements (AKI with Hyperkalemia, Rhabdomyolysis, liver failure) plus cocaine use.  Also found to have elevated troponin. Originally admitted to [facility]. Nephrology, ortho surgery, general surgery and Cardiology following.  12/05/21 -stable, worsening Cr  Assessment & Plan  Principal Problem:   Acute metabolic encephalopathy Active Problems:   Non-ST elevation (NSTEMI) myocardial infarction (HCC)   Cocaine abuse (HCC)   Cocaine poisoning ([place])   Rhabdomyolysis   ATN (acute tubular necrosis) (HCC)   Hyperkalemia   Myositis   Acute hepatitis   Acute renal failure (HCC)   Elevated LFTs   Hypermagnesemia   Infection   Chronic hepatitis C without hepatic coma (HCC)  Severe Left thigh myositis with accompanying proximal fasciitis  Left hip and Knee Joint Effusions concerning for Septic joints - ID following - discontinued vancomycin - discontinue zosyn - started CTX, flagyl 2/4 - follow cultures -ortho surgery following  -  s/p joint aspiration not consistent with septic joint - analgesia PRN  Sudden onset Bilateral hearing loss- improving -Likely drug induced (?speed-balling) in a patient with significant history of IV drug use. -ENT f/u OP  Acute Metabolic Encephalopathy in setting of multiple metabolic derangements & cocaine use ~ IMPROVING. CT head negative -Provide supportive care -Treat metabolic derangements as below -UDS + for cocaine and Benzodiazepines - psychiatry evaluated   Acute Kidney Injury- Cr 3.28>>4.76>6.10>7.07>7.7 today.   Rhabdomyolysis- CK from >50,00 to 11,555> 5,948. Severe Hyperkalemia ~ RESOLVED Hyponatremia- stable at 133 -Monitor I&O's / urinary output -Follow BMP -Ensure adequate renal perfusion -Avoid nephrotoxic agents as able -Replace electrolytes as indicated -IV fluids (NS increased to 175 cc/hr on 2/2) -Nephrology following, appreciate input -Trend CK   Elevated Troponin, demand ischemia vs NSTEMI in setting of Cocaine abuse Prolonged QTc, suspect due to Hyperkalemia +/- drug abuse -cardiology signed off 2/3 - imdur 15mg  daily - completed heparin x48 hours  - Continuous cardiac monitoring -Maintain MAP >65 -IV fluids -Vasopressors as needed to maintain MAP goal   Hepatitis C- likely chronic. Ab positive, viral quant is 900 - f/u OP ID for treatment  DVT prophylaxis: sq heparin   Diet:  Diet Orders (From admission, onward)     Start     Ordered   12/01/21 1040  Diet regular Room service appropriate? Yes; Fluid consistency: Thin  Diet effective now       Question Answer Comment  Room service appropriate? Yes   Fluid consistency: Thin      12/01/21 1039            Subjective 12/05/21    Pt reports doing well. He still has decreased hearing overall. Pain is well controlled. No other complaints.   Disposition Plan & Communication  Patient status: Inpatient  Admitted From: Home Disposition: Home Anticipated discharge date: TBD, based on kidney function recovery  Family Communication: none  Consults, Procedures, Significant Events  Consultants:  ID Ortho [facility] Nephrology Cardiology   Procedures/significant events:  Joint aspiration  Antimicrobials:  Anti-infectives (From admission, onward)    Start  Dose/Rate Route Frequency Ordered Stop   12/04/21 2200  metroNIDAZOLE (FLAGYL) IVPB 500 mg        500 mg 100 mL/hr over 60 Minutes Intravenous Every 12 hours 12/04/21 2044     12/04/21 1100  cefTRIAXone (ROCEPHIN) 2 g in sodium chloride 0.9 % 100 mL IVPB         2 g 200 mL/hr over 30 Minutes Intravenous Every 24 hours 12/04/21 0928     12/03/21 2200  piperacillin-tazobactam (ZOSYN) IVPB 3.375 g  Status:  Discontinued        3.375 g 12.5 mL/hr over 240 Minutes Intravenous Every 12 hours 12/03/21 1324 12/04/21 0926   12/01/21 2200  clindamycin (CLEOCIN) IVPB 600 mg  Status:  Discontinued        600 mg 100 mL/hr over 30 Minutes Intravenous Every 8 hours 12/01/21 2010 12/03/21 1320   12/01/21 2130  piperacillin-tazobactam (ZOSYN) IVPB 3.375 g  Status:  Discontinued        3.375 g 12.5 mL/hr over 240 Minutes Intravenous Every 8 hours 12/01/21 2041 12/03/21 1324   12/01/21 2115  vancomycin (VANCOREADY) IVPB 2000 mg/400 mL        2,000 mg 200 mL/hr over 120 Minutes Intravenous  Once 12/01/21 2022 12/02/21 0059   12/01/21 2042  vancomycin variable dose per unstable renal function (pharmacist dosing)  Status:  Discontinued         Does not apply See admin instructions 12/01/21 2042 12/02/21 2341   12/01/21 2030  ceFEPIme (MAXIPIME) 2 g in sodium chloride 0.9 % 100 mL IVPB  Status:  Discontinued        2 g 200 mL/hr over 30 Minutes Intravenous Every 12 hours 12/01/21 2022 12/01/21 2030       Objective   Vitals:   12/04/21 1212 12/04/21 2141 12/05/21 0447 12/05/21 0723  BP: (!) 146/96 (!) 145/89 (!) 149/86 (!) 144/74  Pulse: 73 74 76 75  Resp: 19 18 16 18   Temp: (!) 97.5 F (36.4 C) 97.8 F (36.6 C) 97.9 F (36.6 C) 98.1 F (36.7 C)  TempSrc: Oral  Oral Oral  SpO2: 99% 100% 99% 98%  Weight:      Height:        Intake/Output Summary (Last 24 hours) at 12/05/2021 0747 Last data filed at 12/05/2021 0500 Gross per 24 hour  Intake 1734.27 ml  Output 4200 ml  Net -2465.73 ml    Filed Weights   12/02/21 0500 12/03/21 0138 12/03/21 2052  Weight: 85.9 kg 90 kg 97.8 kg    Patient BMI: Body mass index is 32.78 kg/m.   Physical Exam:  General: awake, alert, NAD HEENT: atraumatic, clear conjunctiva, anicteric sclera, MMM, hearing grossly  normal Respiratory: normal respiratory effort. Cardiovascular: quick capillary refill  Nervous: A&O x3. Extremities: moves all equally, no edema, normal tone Skin: dry, intact, normal temperature, normal color. No rashes, lesions or ulcers on exposed skin Psychiatry: flat mood, congruent affect  Labs   I have personally reviewed following labs and imaging studies CBC    Component Value Date/Time   WBC 13.1 (H) 12/03/2021 0455   RBC 3.89 (L) 12/03/2021 0455   HGB 12.4 (L) 12/03/2021 0455   HCT 34.8 (L) 12/03/2021 0455   PLT 205 12/03/2021 0455   MCV 89.5 12/03/2021 0455   MCH 31.9 12/03/2021 0455   MCHC 35.6 12/03/2021 0455   RDW 12.4 12/03/2021 0455   LYMPHSABS 1.1 12/01/2021 0607   MONOABS 1.3 (H) 12/01/2021 0607   EOSABS  0.0 12/01/2021 0607   BASOSABS 0.1 12/01/2021 0607   BMP Latest Ref Rng & Units 12/05/2021 12/04/2021 12/03/2021  Glucose 70 - 99 mg/dL 95 101(H) 110(H)  BUN 6 - 20 mg/dL 77(H) 77(H) 76(H)  Creatinine 0.61 - 1.24 mg/dL 7.70(H) 7.07(H) 6.10(H)  Sodium 135 - 145 mmol/L 135 133(L) 132(L)  Potassium 3.5 - 5.1 mmol/L 4.7 4.4 4.5  Chloride 98 - 111 mmol/L 109 105 105  CO2 22 - 32 mmol/L 17(L) 19(L) 17(L)  Calcium 8.9 - 10.3 mg/dL 7.5(L) 7.5(L) 6.9(L)   Imaging Studies  No results found.  Medications   Scheduled Meds:  Chlorhexidine Gluconate Cloth  6 each Topical Daily   heparin injection (subcutaneous)  5,000 Units Subcutaneous Q8H   isosorbide mononitrate  15 mg Oral Daily   pantoprazole  40 mg Oral Daily   No recently discontinued medications to reconcile  LOS: 4 days   Richarda Osmond, DO [facility] 12/05/2021, 7:47 AM   Available by Epic secure chat 7AM-7PM. If 7PM-7AM, please contact night-coverage Refer to [email or web address] to contact the [facility] Attending or Consulting provider for this pt

## 2021-12-06 ENCOUNTER — Inpatient Hospital Stay: Payer: Commercial Managed Care - HMO

## 2021-12-06 DIAGNOSIS — M6282 Rhabdomyolysis: Secondary | ICD-10-CM | POA: Diagnosis not present

## 2021-12-06 DIAGNOSIS — F1410 Cocaine abuse, uncomplicated: Secondary | ICD-10-CM | POA: Diagnosis not present

## 2021-12-06 DIAGNOSIS — B182 Chronic viral hepatitis C: Secondary | ICD-10-CM | POA: Diagnosis not present

## 2021-12-06 DIAGNOSIS — N170 Acute kidney failure with tubular necrosis: Secondary | ICD-10-CM

## 2021-12-06 LAB — HCV RNA QUANT

## 2021-12-06 LAB — CBC WITH DIFFERENTIAL/PLATELET
Abs Immature Granulocytes: 0.21 10*3/uL — ABNORMAL HIGH (ref 0.00–0.07)
Basophils Absolute: 0.1 10*3/uL (ref 0.0–0.1)
Basophils Relative: 1 %
Eosinophils Absolute: 0.2 10*3/uL (ref 0.0–0.5)
Eosinophils Relative: 1 %
HCT: 38.2 % — ABNORMAL LOW (ref 39.0–52.0)
Hemoglobin: 13.5 g/dL (ref 13.0–17.0)
Immature Granulocytes: 2 %
Lymphocytes Relative: 21 %
Lymphs Abs: 2.3 10*3/uL (ref 0.7–4.0)
MCH: 31.5 pg (ref 26.0–34.0)
MCHC: 35.3 g/dL (ref 30.0–36.0)
MCV: 89 fL (ref 80.0–100.0)
Monocytes Absolute: 1.4 10*3/uL — ABNORMAL HIGH (ref 0.1–1.0)
Monocytes Relative: 13 %
Neutro Abs: 6.8 10*3/uL (ref 1.7–7.7)
Neutrophils Relative %: 62 %
Platelets: 241 10*3/uL (ref 150–400)
RBC: 4.29 MIL/uL (ref 4.22–5.81)
RDW: 12.8 % (ref 11.5–15.5)
WBC: 10.9 10*3/uL — ABNORMAL HIGH (ref 4.0–10.5)
nRBC: 0 % (ref 0.0–0.2)

## 2021-12-06 LAB — PHOSPHORUS: Phosphorus: 6.7 mg/dL — ABNORMAL HIGH (ref 2.5–4.6)

## 2021-12-06 LAB — CULTURE, BLOOD (ROUTINE X 2)
Culture: NO GROWTH
Culture: NO GROWTH
Special Requests: ADEQUATE

## 2021-12-06 LAB — COMPREHENSIVE METABOLIC PANEL
ALT: 267 U/L — ABNORMAL HIGH (ref 0–44)
AST: 119 U/L — ABNORMAL HIGH (ref 15–41)
Albumin: 2.4 g/dL — ABNORMAL LOW (ref 3.5–5.0)
Alkaline Phosphatase: 48 U/L (ref 38–126)
Anion gap: 9 (ref 5–15)
BUN: 80 mg/dL — ABNORMAL HIGH (ref 6–20)
CO2: 18 mmol/L — ABNORMAL LOW (ref 22–32)
Calcium: 7.9 mg/dL — ABNORMAL LOW (ref 8.9–10.3)
Chloride: 108 mmol/L (ref 98–111)
Creatinine, Ser: 8.23 mg/dL — ABNORMAL HIGH (ref 0.61–1.24)
GFR, Estimated: 8 mL/min — ABNORMAL LOW (ref >60)
Glucose, Bld: 102 mg/dL — ABNORMAL HIGH (ref 70–99)
Potassium: 4.8 mmol/L (ref 3.5–5.1)
Sodium: 135 mmol/L (ref 135–145)
Total Bilirubin: 0.5 mg/dL (ref 0.3–1.2)
Total Protein: 5.7 g/dL — ABNORMAL LOW (ref 6.5–8.1)

## 2021-12-06 LAB — SEDIMENTATION RATE: Sed Rate: 39 mm/hr — ABNORMAL HIGH (ref 0–15)

## 2021-12-06 LAB — BODY FLUID CULTURE W GRAM STAIN
Culture: NO GROWTH
Culture: NO GROWTH

## 2021-12-06 LAB — LACTIC ACID, PLASMA: Lactic Acid, Venous: 0.9 mmol/L (ref 0.5–1.9)

## 2021-12-06 LAB — CK
Total CK: 3426 U/L — ABNORMAL HIGH (ref 49–397)
Total CK: 2599 U/L — ABNORMAL HIGH (ref 49–397)

## 2021-12-06 MED ORDER — SODIUM BICARBONATE 650 MG PO TABS
650.0000 mg | ORAL_TABLET | Freq: Three times a day (TID) | ORAL | Status: DC
  Administered 2021-12-06 – 2021-12-09 (×9): 650 mg via ORAL
  Filled 2021-12-06 (×12): qty 1

## 2021-12-06 MED ORDER — SODIUM CHLORIDE 0.45 % IV SOLN
INTRAVENOUS | Status: DC
  Filled 2021-12-06 (×2): qty 75

## 2021-12-06 MED ORDER — ISOSORBIDE MONONITRATE ER 30 MG PO TB24
60.0000 mg | ORAL_TABLET | Freq: Every day | ORAL | Status: DC
  Administered 2021-12-07 – 2021-12-09 (×3): 60 mg via ORAL
  Filled 2021-12-06 (×3): qty 2

## 2021-12-06 MED ORDER — ONDANSETRON HCL 4 MG/2ML IJ SOLN
4.0000 mg | Freq: Once | INTRAMUSCULAR | Status: AC
  Administered 2021-12-06: 4 mg via INTRAVENOUS
  Filled 2021-12-06: qty 2

## 2021-12-06 MED ORDER — CEFAZOLIN SODIUM-DEXTROSE 1-4 GM/50ML-% IV SOLN
1.0000 g | Freq: Two times a day (BID) | INTRAVENOUS | Status: AC
  Administered 2021-12-07 – 2021-12-08 (×4): 1 g via INTRAVENOUS
  Filled 2021-12-06 (×4): qty 50

## 2021-12-06 MED ORDER — ISOSORBIDE MONONITRATE ER 30 MG PO TB24
30.0000 mg | ORAL_TABLET | Freq: Once | ORAL | Status: AC
  Administered 2021-12-06: 30 mg via ORAL
  Filled 2021-12-06: qty 1

## 2021-12-06 MED ORDER — ISOSORBIDE MONONITRATE ER 30 MG PO TB24
30.0000 mg | ORAL_TABLET | Freq: Every day | ORAL | Status: DC
  Administered 2021-12-06: 30 mg via ORAL
  Filled 2021-12-06: qty 1

## 2021-12-06 NOTE — Progress Notes (Signed)
Patient resting. Ch will try another visit soon. Ch is aware that Pt has asked to be visited.

## 2021-12-06 NOTE — Progress Notes (Signed)
[facility]  PROGRESS NOTE   Subjective:   Patient resting quietly Tolerating meals , denies nausea and vomiting Denies shortness of breath  Creatinine 8.23 Ck decreasing 3426  Objective:  Vital signs in last 24 hours:  Temp:  [97.8 F (36.6 C)-98.4 F (36.9 C)] 98 F (36.7 C) (02/06 0736) Pulse Rate:  [60-73] 64 (02/06 0736) Resp:  [15-20] 15 (02/06 0736) BP: (137-161)/(81-85) 156/81 (02/06 0736) SpO2:  [99 %-100 %] 99 % (02/06 0736)  Weight change:  Filed Weights   12/02/21 0500 12/03/21 0138 12/03/21 2052  Weight: 85.9 kg 90 kg 97.8 kg    Intake/Output: I/O last 3 completed shifts: In: 4071.2 [P.O.:600; I.V.:3071.5; IV Piggyback:399.7] Out: 3901 [Urine:3900; Stool:1]   Intake/Output this shift:  Total I/O In: 240 [P.O.:240] Out: 1150 [Urine:1150]  Physical Exam: General:  No acute distress  Head:  Normocephalic, atraumatic. Moist oral mucosal membranes  Eyes:  Anicteric  Lungs:   Clear to auscultation, normal effort  Heart:  S1S2 no rubs  Abdomen:   Soft, nontender, bowel sounds present  Extremities:  No peripheral edema.  Neurologic:  Awake, alert, following commands  Skin:  No lesions  Access:     Basic Metabolic Panel: Recent Labs  Lab 12/01/21 0607 12/01/21 1432 12/02/21 0855 12/03/21 0455 12/04/21 0558 12/05/21 0406 12/06/21 0616  NA 133*   < > 132* 132* 133* 135 135  K 6.6*   < > 4.3 4.5 4.4 4.7 4.8  CL 96*   < > 101 105 105 109 108  CO2 22   < > 19* 17* 19* 17* 18*  GLUCOSE 210*   < > 111* 110* 101* 95 102*  BUN 46*   < > 65* 76* 77* 77* 80*  CREATININE 3.28*   < > 4.76* 6.10* 7.07* 7.70* 8.23*  CALCIUM 7.3*   < > 6.9* 6.9* 7.5* 7.5* 7.9*  MG 2.6*  --   --  2.0  --  2.0  --   PHOS  --   --   --  6.3*  --  6.6* 6.7*   < > = values in this interval not displayed.     CBC: Recent Labs  Lab 12/01/21 0607 12/02/21 0855 12/03/21 0455 12/06/21 0616  WBC 21.4* 18.3* 13.1* 10.9*  NEUTROABS 18.7*  --   --  6.8  HGB  15.9 15.0 12.4* 13.5  HCT 47.3 43.9 34.8* 38.2*  MCV 92.2 90.3 89.5 89.0  PLT 329 237 205 241      Urinalysis: No results for input(s): COLORURINE, LABSPEC, PHURINE, GLUCOSEU, HGBUR, BILIRUBINUR, KETONESUR, PROTEINUR, UROBILINOGEN, NITRITE, LEUKOCYTESUR in the last 72 hours.  Invalid input(s): APPERANCEUR    Imaging: No results found.   Medications:    sodium chloride Stopped (12/05/21 1134)   cefTRIAXone (ROCEPHIN)  IV 2 g (12/06/21 1000)   lactated ringers 125 mL/hr at 12/06/21 0956   metronidazole 500 mg (12/06/21 0839)    Chlorhexidine Gluconate Cloth  6 each Topical Daily   heparin injection (subcutaneous)  5,000 Units Subcutaneous Q8H   [START ON 12/07/2021] isosorbide mononitrate  60 mg Oral Daily   pantoprazole  40 mg Oral Daily    Assessment/ Plan:     Principal Problem:   Acute metabolic encephalopathy Active Problems:   Non-ST elevation (NSTEMI) myocardial infarction (HCC)   Cocaine abuse (HCC)   Cocaine poisoning (HCC)   Rhabdomyolysis   ATN (acute tubular necrosis) (HCC)   Hyperkalemia   Myositis   Acute hepatitis  Acute renal failure (HCC)   Elevated LFTs   Hypermagnesemia   Infection   Chronic hepatitis C without hepatic coma ([place])  36 year old male with history of drug abuse admitted for mental status changes.  He was found to have non-ST elevated MI, severe rhabdomyolysis and acute kidney injury.  #1: Acute kidney injury: Likely due to severe rhabdomyolysis. Creatinine continues to rise, but adequate urine output recorded of 2.6L in past 24 hours. Will continue to monitor closely.   #2: Rhabdomyolysis/severe myositis: CK continues to decrease.   #3: Metabolic encephalopathy: Secondary to drug overdose and drug abuse.   LOS: [facility] 2/6/20231:38 PM

## 2021-12-06 NOTE — Progress Notes (Addendum)
PROGRESS NOTE  Edwin [name]    DOB: December 09, 1985, 36 y.o.  [id number]  PCP: Pcp, No   Code Status: Full Code   DOA: 12/01/2021   LOS: 5  Brief Narrative of Current Hospitalization  Edwin [name] is a 36 y.o. male with a PMH significant for polysubstance abuse. They presented from home to the ED on 12/01/2021 with AMS.  In the ED, it was found that they had acute metabolic encephalopathy in the setting of multiple severe metabolic derangements (AKI with Hyperkalemia, Rhabdomyolysis, liver failure) plus cocaine use.  Also found to have elevated troponin. Originally admitted to [facility]. Nephrology, ortho surgery, general surgery and Cardiology following.  12/06/21 -stable, worsening Cr  Assessment & Plan  Principal Problem:   Acute metabolic encephalopathy Active Problems:   Non-ST elevation (NSTEMI) myocardial infarction (HCC)   Cocaine abuse (HCC)   Cocaine poisoning ([facility])   Rhabdomyolysis   ATN (acute tubular necrosis) (HCC)   Hyperkalemia   Myositis   Acute hepatitis   Acute renal failure (HCC)   Elevated LFTs   Hypermagnesemia   Infection   Chronic hepatitis C without hepatic coma (HCC)  Severe Left thigh myositis with accompanying proximal fasciitis  Left hip and Knee Joint Effusions concerning for Septic joints - ID following - discontinued vancomycin - discontinue zosyn 2/4 - started CTX, flagyl 2/4 - follow cultures. Repeat 2/2 are NGTD -ortho surgery following  -  s/p joint aspiration 2/2 not consistent with septic joint. Joint fluid culture NGTD - analgesia PRN  Sudden onset Bilateral hearing loss- improving -Likely drug induced (?speed-balling) in a patient with significant history of IV drug use. -ENT f/u OP  Acute Metabolic Encephalopathy in setting of multiple metabolic derangements & cocaine use ~ IMPROVING. CT head negative -Provide supportive care -Treat metabolic derangements as below -UDS + for cocaine and Benzodiazepines - psychiatry evaluated    Acute Kidney Injury- Cr 3.28>>4.76>6.10>7.07>7.7>8.23 today.  Rhabdomyolysis- CK from >50,00 to 11,555> [id number]. (Do not need to trend further) Severe Hyperkalemia ~ RESOLVED Hyponatremia- resolved -Monitor I&O's / urinary output -Follow BMP -Ensure adequate renal perfusion -Avoid nephrotoxic agents as able -Replace electrolytes as indicated -IV fluids (NS increased to 175 cc/hr on 2/2) -Nephrology following, appreciate input  - likely to require renal replacement therapy   Elevated Troponin, demand ischemia vs NSTEMI in setting of Cocaine abuse Prolonged Qtc   HTN -cardiology signed off 2/3 - increase imdur to 60mg  daily - completed heparin x48 hours  - Continuous cardiac monitoring -Maintain MAP >65 -IV fluids -Vasopressors as needed to maintain MAP goal   Hepatitis C- likely chronic. Ab positive, viral quant is 900 - f/u OP ID for treatment  DVT prophylaxis: sq heparin  Diet:  Diet Orders (From admission, onward)     Start     Ordered   12/01/21 1040  Diet regular Room service appropriate? Yes; Fluid consistency: Thin  Diet effective now       Question Answer Comment  Room service appropriate? Yes   Fluid consistency: Thin      12/01/21 1039            Subjective 12/06/21    Pt reports no complaints today. He recently received his pain medication and is feeling tired  Disposition Plan & Communication  Patient status: Inpatient  Admitted From: Home Disposition: SNF Anticipated discharge date: TBD, based on kidney function recovery  Family Communication: none  Consults, Procedures, Significant Events  Consultants:  ID Ortho CCM Nephrology Cardiology   Procedures/significant events:  Joint aspiration  Antimicrobials:  Anti-infectives (From admission, onward)    Start     Dose/Rate Route Frequency Ordered Stop   12/04/21 2200  metroNIDAZOLE (FLAGYL) IVPB 500 mg        500 mg 100 mL/hr over 60 Minutes Intravenous Every 12 hours 12/04/21 2044      12/04/21 1100  cefTRIAXone (ROCEPHIN) 2 g in sodium chloride 0.9 % 100 mL IVPB        2 g 200 mL/hr over 30 Minutes Intravenous Every 24 hours 12/04/21 0928     12/03/21 2200  piperacillin-tazobactam (ZOSYN) IVPB 3.375 g  Status:  Discontinued        3.375 g 12.5 mL/hr over 240 Minutes Intravenous Every 12 hours 12/03/21 1324 12/04/21 0926   12/01/21 2200  clindamycin (CLEOCIN) IVPB 600 mg  Status:  Discontinued        600 mg 100 mL/hr over 30 Minutes Intravenous Every 8 hours 12/01/21 2010 12/03/21 1320   12/01/21 2130  piperacillin-tazobactam (ZOSYN) IVPB 3.375 g  Status:  Discontinued        3.375 g 12.5 mL/hr over 240 Minutes Intravenous Every 8 hours 12/01/21 2041 12/03/21 1324   12/01/21 2115  vancomycin (VANCOREADY) IVPB 2000 mg/400 mL        2,000 mg 200 mL/hr over 120 Minutes Intravenous  Once 12/01/21 2022 12/02/21 0059   12/01/21 2042  vancomycin variable dose per unstable renal function (pharmacist dosing)  Status:  Discontinued         Does not apply See admin instructions 12/01/21 2042 12/02/21 2341   12/01/21 2030  ceFEPIme (MAXIPIME) 2 g in sodium chloride 0.9 % 100 mL IVPB  Status:  Discontinued        2 g 200 mL/hr over 30 Minutes Intravenous Every 12 hours 12/01/21 2022 12/01/21 2030       Objective   Vitals:   12/05/21 1552 12/05/21 2020 12/05/21 2352 12/06/21 0430  BP: 137/84 (!) 152/84 (!) 161/82 (!) 161/85  Pulse: 73 60 60 68  Resp: 16 18 18 20   Temp: 97.8 F (36.6 C) 98.1 F (36.7 C) 98.2 F (36.8 C) 98.4 F (36.9 C)  TempSrc:  Oral Oral Oral  SpO2: 100% 100% 100% 100%  Weight:      Height:        Intake/Output Summary (Last 24 hours) at 12/06/2021 0718 Last data filed at 12/06/2021 0430 Gross per 24 hour  Intake 3274.94 ml  Output 2601 ml  Net 673.94 ml    Filed Weights   12/02/21 0500 12/03/21 0138 12/03/21 2052  Weight: 85.9 kg 90 kg 97.8 kg    Patient BMI: Body mass index is 32.78 kg/m.   Physical Exam:  General: awake, alert,  NAD HEENT: atraumatic, clear conjunctiva, anicteric sclera, MMM, hearing grossly normal Respiratory: normal respiratory effort. Cardiovascular: quick capillary refill  Nervous: A&O x3. Extremities: moves all equally, no edema, normal tone Skin: dry, intact, normal temperature, normal color. No rashes, lesions or ulcers on exposed skin Psychiatry: flat mood, congruent affect  Labs   I have personally reviewed following labs and imaging studies CBC    Component Value Date/Time   WBC 10.9 (H) 12/06/2021 0616   RBC 4.29 12/06/2021 0616   HGB 13.5 12/06/2021 0616   HCT 38.2 (L) 12/06/2021 0616   PLT 241 12/06/2021 0616   MCV 89.0 12/06/2021 0616   MCH 31.5 12/06/2021 0616   MCHC 35.3 12/06/2021 0616   RDW 12.8 12/06/2021 0616   LYMPHSABS  2.3 12/06/2021 0616   MONOABS 1.4 (H) 12/06/2021 0616   EOSABS 0.2 12/06/2021 0616   BASOSABS 0.1 12/06/2021 0616   BMP Latest Ref Rng & Units 12/06/2021 12/05/2021 12/04/2021  Glucose 70 - 99 mg/dL 102(H) 95 101(H)  BUN 6 - 20 mg/dL 80(H) 77(H) 77(H)  Creatinine 0.61 - 1.24 mg/dL 8.23(H) 7.70(H) 7.07(H)  Sodium 135 - 145 mmol/L 135 135 133(L)  Potassium 3.5 - 5.1 mmol/L 4.8 4.7 4.4  Chloride 98 - 111 mmol/L 108 109 105  CO2 22 - 32 mmol/L 18(L) 17(L) 19(L)  Calcium 8.9 - 10.3 mg/dL 7.9(L) 7.5(L) 7.5(L)   Imaging Studies  No results found.  Medications   Scheduled Meds:  Chlorhexidine Gluconate Cloth  6 each Topical Daily   heparin injection (subcutaneous)  5,000 Units Subcutaneous Q8H   isosorbide mononitrate  15 mg Oral Daily   pantoprazole  40 mg Oral Daily   No recently discontinued medications to reconcile  LOS: 5 days   Richarda Osmond, DO [facility] 12/06/2021, 7:18 AM   Available by Epic secure chat 7AM-7PM. If 7PM-7AM, please contact night-coverage Refer to [email or web address] to contact the [facility] Attending or Consulting provider for this pt

## 2021-12-06 NOTE — Progress Notes (Signed)
Cultures remain negative to date.  No evidence of left septic knee or hip joints.  White count has continued to decline.  Continue with physical therapy.  Signing off for now.

## 2021-12-06 NOTE — Progress Notes (Signed)
requested AD, received, declined further visit

## 2021-12-06 NOTE — Progress Notes (Signed)
Went in room found patient sitting up in bed clinching the rails in tears and yelling out in pain. Patient had already had pain medication. Examined left leg, thigh and it was extremely swollen and tight or taut. Message MD, she was unavailable and asked if I could reach out to the night attending but the night attending does not start until 7p. Added night attendings to the message.

## 2021-12-06 NOTE — Progress Notes (Signed)
Physical Therapy Treatment Patient Details Name: Edwin [name] MRN: [id number] DOB: 1986/04/26 Today's Date: 12/06/2021   History of Present Illness Pt is a 36 y/o M admitted on 12/01/21 after presenting to the ED with AMS. Pt was found to have acute metabolic encephalopathy in the setting of multiple severe metabolic derangements (AKI with Hyperkalemia, Rhabdomyolysis, liver failure) plus cocaine use.  Also found to have elevated troponin. Pt found to have Severe Left thigh myositis with accompanying proximal fasciitis, Left hip and Knee Joint Effusions concerning for Septic joints with ID & ortho surgery following. No orthopedic surgical intervention for septic left hip or knee joint planned as there is no evidence for septic joints at this time. PMH: polysubstance abuse    PT Comments    Pt seen for PT tx with pt endorsing nausea but agreeable to tx. Pt reports pain is slightly improved compared to yesterday but is still significant. Pt is able to complete bed mobility without physical assistance but HOB slightly elevated with extra time. PT continues to provide cuing for RW & hand placement for increased safety with sit<>stand transfers. Pt ambulates into hallway with RW & supervision with decreased gait speed, wide BOS, decreased heel strike BLE, and cuing to ambulate within base of AD. Pt declines further gait 2/2 not feeling well. Back in bed pt performs LLE strengthening exercises with AAROM PRN. Provided pt with HEP but will need to review additional exercises tomorrow. Pt with continued LLE edema & encouraged to perform exercises while in bed to help with edema management. Pt mobilizes well without physical assistance but is primarily limited by pain. Due to improvements, will update d/c recommendations to HHPT f/u & notify team.    Recommendations for follow up therapy are one component of a multi-disciplinary discharge planning process, led by the attending physician.  Recommendations may be  updated based on patient status, additional functional criteria and insurance authorization.  Follow Up Recommendations  Home health PT     Assistance Recommended at Discharge Intermittent Supervision/Assistance  Patient can return home with the following A little help with walking and/or transfers;A little help with bathing/dressing/bathroom;Assistance with cooking/housework;Assist for transportation;Help with stairs or ramp for entrance   Equipment Recommendations  Rolling walker (2 wheels);BSC/3in1    Recommendations for Other Services       Precautions / Restrictions Precautions Precautions: Fall Restrictions Weight Bearing Restrictions: Yes RLE Weight Bearing: Weight bearing as tolerated LLE Weight Bearing: Weight bearing as tolerated     Mobility  Bed Mobility Overal bed mobility: Needs Assistance Bed Mobility: Sit to Supine, Supine to Sit     Supine to sit: Supervision, HOB elevated Sit to supine: Supervision, HOB elevated   General bed mobility comments: extra time to move LE to EOB    Transfers Overall transfer level: Needs assistance Equipment used: Rolling walker (2 wheels) Transfers: Sit to/from Stand Sit to Stand: Supervision (cuing for hand placement, pt completes sit>stand with LLE on outside of BOS RW despite PT cuing)                Ambulation/Gait Ambulation/Gait assistance: Supervision Gait Distance (Feet): 25 Feet Assistive device: Rolling walker (2 wheels) Gait Pattern/deviations: Decreased step length - right, Decreased step length - left, Decreased stride length, Decreased weight shift to left, Trunk flexed, Wide base of support Gait velocity: decreased     General Gait Details: Decreased heel strike BLE   Stairs             Wheelchair Mobility  Modified Rankin (Stroke Patients Only)       Balance Overall balance assessment: Needs assistance Sitting-balance support: Feet supported Sitting balance-Leahy Scale:  Good     Standing balance support: During functional activity, Bilateral upper extremity supported, Reliant on assistive device for balance Standing balance-Leahy Scale: Fair                              Cognition Arousal/Alertness: Awake/alert Behavior During Therapy: WFL for tasks assessed/performed Overall Cognitive Status: Within Functional Limits for tasks assessed                                          Exercises General Exercises - Lower Extremity Quad Sets: AROM, Left, 5 reps, Supine Long Arc Quad: AROM, Strengthening, Left, 10 reps, Seated Heel Slides: AAROM, Strengthening, Left, 10 reps, Supine Hip ABduction/ADduction: AAROM, Strengthening, Left, 10 reps, Supine (hip abduction slides) Straight Leg Raises: AAROM, Strengthening, Left, 10 reps, Supine    General Comments        Pertinent Vitals/Pain Pain Assessment Pain Assessment: Faces Faces Pain Scale: Hurts whole lot Pain Location: L knee Pain Descriptors / Indicators: Grimacing, Guarding, Aching Pain Intervention(s): Limited activity within patient's tolerance, Monitored during session, Repositioned    Home Living                          Prior Function            PT Goals (current goals can now be found in the care plan section) Acute Rehab PT Goals Patient Stated Goal: decreased pain PT Goal Formulation: With patient Time For Goal Achievement: 12/19/21 Potential to Achieve Goals: Good Progress towards PT goals: Progressing toward goals    Frequency    7X/week      PT Plan Discharge plan needs to be updated    Co-evaluation              AM-PAC PT "6 Clicks" Mobility   Outcome Measure  Help needed turning from your back to your side while in a flat bed without using bedrails?: None Help needed moving from lying on your back to sitting on the side of a flat bed without using bedrails?: None Help needed moving to and from a bed to a chair  (including a wheelchair)?: A Little Help needed standing up from a chair using your arms (e.g., wheelchair or bedside chair)?: A Little Help needed to walk in hospital room?: A Little Help needed climbing 3-5 steps with a railing? : A Lot 6 Click Score: 19    End of Session Equipment Utilized During Treatment: Gait belt Activity Tolerance: Patient limited by pain (limited by c/o nausea) Patient left: in bed;with call bell/phone within reach Nurse Communication: Mobility status PT Visit Diagnosis: Difficulty in walking, not elsewhere classified (R26.2);Pain Pain - Right/Left: Left Pain - part of body: Leg;Knee     Time: [id number] PT Time Calculation (min) (ACUTE ONLY): 23 min  Charges:  $Therapeutic Exercise: 8-22 mins $Therapeutic Activity: 8-22 mins                     Lavone Nian, PT, DPT 12/06/21, 3:25 PM    Waunita Schooner 12/06/2021, 3:22 PM

## 2021-12-06 NOTE — Progress Notes (Addendum)
ID Pt doing better [facility] with PT Pain left thigh better  O/e awake and alert, no distress Patient Vitals for the past 24 hrs:  BP Temp Temp src Pulse Resp SpO2  12/06/21 1539 138/81 98 F (36.7 C) Oral (!) 53 15 100 %  12/06/21 0736 (!) 156/81 98 F (36.7 C) Oral 64 15 99 %  12/06/21 0430 (!) 161/85 98.4 F (36.9 C) Oral 68 20 100 %  12/05/21 2352 (!) 161/82 98.2 F (36.8 C) Oral 60 18 100 %  12/05/21 2020 (!) 152/84 98.1 F (36.7 C) Oral 60 18 100 %   Chest b/l air entry Hss1s Abd soft Left thigh less swollen, less tender CNS non focal Left upper arm no erythema   Labs CBC Latest Ref Rng & Units 12/06/2021 12/03/2021 12/02/2021  WBC 4.0 - 10.5 K/uL 10.9(H) 13.1(H) 18.3(H)  Hemoglobin 13.0 - 17.0 g/dL 13.5 12.4(L) 15.0  Hematocrit 39.0 - 52.0 % 38.2(L) 34.8(L) 43.9  Platelets 150 - 400 K/uL 241 205 237    CMP Latest Ref Rng & Units 12/06/2021 12/05/2021 12/04/2021  Glucose 70 - 99 mg/dL 102(H) 95 101(H)  BUN 6 - 20 mg/dL 80(H) 77(H) 77(H)  Creatinine 0.61 - 1.24 mg/dL 8.23(H) 7.70(H) 7.07(H)  Sodium 135 - 145 mmol/L 135 135 133(L)  Potassium 3.5 - 5.1 mmol/L 4.8 4.7 4.4  Chloride 98 - 111 mmol/L 108 109 105  CO2 22 - 32 mmol/L 18(L) 17(L) 19(L)  Calcium 8.9 - 10.3 mg/dL 7.9(L) 7.5(L) 7.5(L)  Total Protein 6.5 - 8.1 g/dL 5.7(L) 5.2(L) 5.6(L)  Total Bilirubin 0.3 - 1.2 mg/dL 0.5 0.5 0.7  Alkaline Phos 38 - 126 U/L 48 47 61  AST 15 - 41 U/L 119(H) 185(H) 292(H)  ALT 0 - 44 U/L 267(H) 369(H) 553(H)    Micro 12/01/21 BC0 NG Synovial fluid culture knee, hip form 12/02/21 no growth   Impression/recommendation  Severe rhabdomyolysis related to cocaine and benzo use, was on IV fluids, no evidence of necrotizing fasciitis of the thigh, no evidence of necrosis of muscle No reason to suspect gas gangrene No evidence of septic arthritis of the knee or the hip on the left side Patient currently on ceftriaxone and Flagyl, will change to cefazolin and continue Flagyl until  12/08/2021.  Acute encephalopathy is resolved  AKI: In spite of patient improving and all other aspects the creatinine is worsening and it is now above 8.  Patient is having good urine output.  Renal is following him.  He is getting soda bicarb for urine alkalinization.  No plans for dialysis.  Hep C.  Low-level viremia Management as outpatient.  ID will sign off call if needed.

## 2021-12-06 NOTE — Progress Notes (Signed)
° °      CROSS COVER NOTE  NAME: Edwin [name] MRN: [id number] DOB : February 04, 1986  Added to secure chat at [place] PM requesting I come to bedside to evaluate a wound concerning for infection/fascitits/compartment syndrome.  Patient admitted 2/1 for altered mental status, found to have a severe AKI and rhabdomyolysis.  Ortho was consulted for concern for (L) sided septic hip and knee and both sites were aspirated.  He was found to have no evidence of septic knee or hip. CT on 2/1 did show myositis and fasciitis.    At approximately 1800 patient's RN reported that patient was crying and yelling out in pain. On arrival to bedside patient is calm, cooperative, and no pain meds received since RN sent message to Dr Ouida Sills at [phone].  On exam the (L) thigh and knee are swollen but not firm or warm to the touch.  It is unclear from prior documentation or nursing if the swelling has changed significantly.  CT WO ordered and shows mildly progressive changes of myositis and fasciitis and increased soft tissue edema without gas.  CK 2599 down from 3426 Lactic acid 0.9  Sed Rate 39 Order placed for q8H measurement of thigh circumference Images placed in media tab of (L) lower extremity  Case discussed with general surgery, no need for acute intervention or surgical exploration at this time.    Neomia Glass MHA, MSN, FNP-BC Nurse Practitioner [facility] Pager [phone]

## 2021-12-06 NOTE — TOC Progression Note (Signed)
Transition of Care [facility]) - Progression Note    Patient Details  Name: Edwin [name] MRN: [id number] Date of Birth: December 15, 1985  Transition of Care [facility]) CM/SW Contact  Maree Krabbe, LCSW Phone Number: 12/06/2021, 11:54 AM  Clinical Narrative:   Per RN in rounds multiple medical needs, pt not medically stable. NO bed offers at this time. Three SNFs pending--referral resent. CSW to follow up.    Expected Discharge Plan: Skilled Nursing Facility Barriers to Discharge: Continued Medical Work up  Expected Discharge Plan and Services Expected Discharge Plan: Skilled Nursing Facility   Discharge Planning Services: CM Consult Post Acute Care Choice: Skilled Nursing Facility Living arrangements for the past 2 months: Single Family Home                                       Social Determinants of Health (SDOH) Interventions    Readmission Risk Interventions No flowsheet data found.

## 2021-12-07 LAB — LACTIC ACID, PLASMA: Lactic Acid, Venous: 1 mmol/L (ref 0.5–1.9)

## 2021-12-07 LAB — RENAL FUNCTION PANEL
Albumin: 2.4 g/dL — ABNORMAL LOW (ref 3.5–5.0)
Anion gap: 8 (ref 5–15)
BUN: 86 mg/dL — ABNORMAL HIGH (ref 6–20)
CO2: 19 mmol/L — ABNORMAL LOW (ref 22–32)
Calcium: 8.1 mg/dL — ABNORMAL LOW (ref 8.9–10.3)
Chloride: 109 mmol/L (ref 98–111)
Creatinine, Ser: 8 mg/dL — ABNORMAL HIGH (ref 0.61–1.24)
GFR, Estimated: 8 mL/min — ABNORMAL LOW (ref >60)
Glucose, Bld: 111 mg/dL — ABNORMAL HIGH (ref 70–99)
Phosphorus: 7 mg/dL — ABNORMAL HIGH (ref 2.5–4.6)
Potassium: 5.1 mmol/L (ref 3.5–5.1)
Sodium: 136 mmol/L (ref 135–145)

## 2021-12-07 NOTE — Progress Notes (Signed)
PT Cancellation Note  Patient Details Name: Ryota Treece MRN: [id number] DOB: 05-09-1986   Cancelled Treatment:     Therapist in this am, pt sleeping. Upon return, pt awake, nursing in room. Pt refused any activity despite nursing recommendation to get out of bed. Will try again next available time/date.   Jannet Askew 12/07/2021, 11:59 AM

## 2021-12-07 NOTE — Progress Notes (Signed)
OT Cancellation Note  Patient Details Name: Edwin [name] MRN: [id number] DOB: June 27, 1986   Cancelled Treatment:    Reason Eval/Treat Not Completed: Other (comment) Pt declined OT intervention and reports 8/10 pain in B LEs.  OT to re-attempt at next available time.   Darleen Crocker, MS, OTR/L , CBIS ascom [phone]  12/07/21, 2:29 PM

## 2021-12-07 NOTE — Consult Note (Signed)
Subjective:   CC: left thigh pain  HPI:  Edwin [name] is a 36 y.o. male who was consulted by [facility] for evaluation of above.  First noted 6 days ago.  Symptoms include: Pain is sharp, constant pain in left thigh with no change in overall intensity per patient report.  Exacerbated by touch.  Alleviated by nothing specific.  Associated with nothing specific.  Pt interestingly did not mention the thigh pain until specifically asked about it.  He first reported only having bilateral hip pain.  Cannot recall why he was admitted to hospital and could not provide any further details surrounding time right before admission.     Past Medical History: none reported  Past Surgical History: none reported  Family History: reviewed and not relevant to CC  Social History: hx of cocaine abuse  Current Medications:  Prior to Admission medications   Not on File    Allergies:  No Known Allergies  ROS:  General: Denies weight loss, weight gain, fatigue, fevers, chills, and night sweats. Eyes: Denies blurry vision, double vision, eye pain, itchy eyes, and tearing. Ears: Denies hearing loss, earache, and ringing in ears. Nose: Denies sinus pain, congestion, infections, runny nose, and nosebleeds. Mouth/throat: Denies hoarseness, sore throat, bleeding gums, and difficulty swallowing. Heart: Denies chest pain, palpitations, racing heart, irregular heartbeat, leg pain or swelling, and decreased activity tolerance. Respiratory: Denies breathing difficulty, shortness of breath, wheezing, cough, and sputum. GI: Denies change in appetite, heartburn, nausea, vomiting, constipation, diarrhea, and blood in stool. GU: Denies difficulty urinating, pain with urinating, urgency, frequency, blood in urine. Musculoskeletal: Denies joint stiffness, pain, swelling, muscle weakness. Skin: Denies rash, itching, mass, tumors, sores, and boils Neurologic: Denies headache, fainting, dizziness, seizures, numbness, and  tingling. Psychiatric: Denies depression, anxiety, difficulty sleeping, and memory loss. Endocrine: Denies heat or cold intolerance, and increased thirst or urination. Blood/lymph: Denies easy bruising, easy bruising, and swollen glands     Objective:     BP (!) 146/77 (BP Location: Left Arm)    Pulse 64    Temp 98 F (36.7 C)    Resp 18    Ht 5\' 8"  (1.727 m)    Wt 97.8 kg    SpO2 99%    BMI 32.78 kg/m   Constitutional :  alert, cooperative, appears stated age, and no distress  Lymphatics/Throat:  no asymmetry, masses, or scars  Respiratory:  clear to auscultation bilaterally  Cardiovascular:  regular rate and rhythm  Gastrointestinal: soft, non-tender; bowel sounds normal; no masses,  no organomegaly.  Musculoskeletal: Steady gait and movement  Skin: Cool and moist, right thigh with very suttle swelling on middle portion of vastus lateralis.  No overlying skin changes noted.  Focal TTP around area of concern noted on CT below.  No other TTP noted in rest of leg, with full ROM.  Psychiatric: Normal affect, non-agitated, not confused       LABS:  CMP Latest Ref Rng & Units 12/07/2021 12/06/2021 12/05/2021  Glucose 70 - 99 mg/dL 111(H) 102(H) 95  BUN 6 - 20 mg/dL 86(H) 80(H) 77(H)  Creatinine 0.61 - 1.24 mg/dL 8.00(H) 8.23(H) 7.70(H)  Sodium 135 - 145 mmol/L 136 135 135  Potassium 3.5 - 5.1 mmol/L 5.1 4.8 4.7  Chloride 98 - 111 mmol/L 109 108 109  CO2 22 - 32 mmol/L 19(L) 18(L) 17(L)  Calcium 8.9 - 10.3 mg/dL 8.1(L) 7.9(L) 7.5(L)  Total Protein 6.5 - 8.1 g/dL - 5.7(L) 5.2(L)  Total Bilirubin 0.3 - 1.2 mg/dL -  0.5 0.5  Alkaline Phos 38 - 126 U/L - 48 47  AST 15 - 41 U/L - 119(H) 185(H)  ALT 0 - 44 U/L - 267(H) 369(H)   CBC Latest Ref Rng & Units 12/06/2021 12/03/2021 12/02/2021  WBC 4.0 - 10.5 K/uL 10.9(H) 13.1(H) 18.3(H)  Hemoglobin 13.0 - 17.0 g/dL 13.5 12.4(L) 15.0  Hematocrit 39.0 - 52.0 % 38.2(L) 34.8(L) 43.9  Platelets 150 - 400 K/uL 241 205 237    RADS: CLINICAL DATA:  Left  lower leg swelling. Renal insufficiency. Not on dialysis. Polysubstance abuse. Changes of myositis and fasciitis on recent CT and MRI examinations.   EXAM: CT OF THE LOWER LEFT EXTREMITY WITHOUT CONTRAST   TECHNIQUE: Multidetector CT imaging of the lower left extremity was performed according to the standard protocol.   RADIATION DOSE REDUCTION: This exam was performed according to the departmental dose-optimization program which includes automated exposure control, adjustment of the mA and/or kV according to patient size and/or use of iterative reconstruction technique.   COMPARISON:  MRI of the left lower extremity dated 12/02/2021. CT of the left lower extremity dated 12/01/2021.   FINDINGS: Bones/Joint/Cartilage   No bone destruction or periosteal reaction. Small to moderate-sized knee effusion with progression.   Ligaments   Suboptimally assessed by CT.   Muscles and Tendons   The previously demonstrated 3.2 x 2.4 cm area of low density in the left biceps femoris muscle currently measures 3.0 x 2.2 cm on image number 29/5.   There is interval better defined low density in the left vastus lateralis muscle, measuring 9.3 x 8.1 cm on image number 24/5.   Interval better defined low density in the lateral aspect of the gluteus maximus muscle, measuring 4.9 x 3.0 cm on image number 144/5.   Interval low density in the posterior aspect of the medial rectus muscle measuring 4.6 x 1.7 cm on image number 91/5.   Crescentic low density in the lateral aspect of the gluteus medius muscle with adjacent subfascial fluid. The subfascial fluid has increased and is more confluent without evidence of abscess.   Soft tissues   Extensive subcutaneous edema at the level of the pelvis and throughout the proximal and extending laterally in the lower leg. No areas of muscle low density in the lower leg. Fluid density between muscles in the superior aspect of the left thigh mild  overall progression. No soft tissue gas.   IMPRESSION: 1. Mildly progressive changes of myositis and fasciitis involving the upper left leg and buttock regions with increased soft tissue edema. 2. No discrete fluid collections suspicious for abscess visualized. 3. No soft tissue gas or bony changes of osteomyelitis. 4. Progressive knee effusion.     Electronically Signed   By: Claudie Revering M.D.   On: 12/06/2021 20:45 CLINICAL DATA:  Soft tissue infection suspected. History of polysubstance abuse. Acute metabolic encephalopathy.   EXAM: MR OF THE LEFT LOWER EXTREMITY WITHOUT AND WITH CONTRAST   TECHNIQUE: Multiplanar, multisequence MR imaging of the left thigh was performed both before and after administration of intravenous contrast. Exam was somewhat limited due to patient's ability to stay still due to encephalopathy.   CONTRAST:  66mL GADAVIST GADOBUTROL 1 MMOL/ML IV SOLN   COMPARISON:  None.   FINDINGS: Bones/Joint/Cartilage   No evidence of acute fracture or dislocation. Evaluation is however somewhat limited due to significant motion and T2 weighted sequences.   Ligaments   No significant abnormality   Muscles and Tendons   There is muscle  edema of the lateral thigh muscles involving the rectus lateralis. On post contrast sequence there is decreased perfusion with peripheral enhancement (series 25, image 57-62). There is a small area of decreased perfusion with peripheral enhancement in the biceps femoris muscle on axial post contrast sequence (series 25 images 56-64) concerning for developing abscess.   There is also similar focus of decreased perfusion with peripheral enhancement in the lateral aspect of the gluteus maximus (images 14-18)   Soft tissues   Marked subcutaneous soft tissue edema about lateral aspect of the thigh without evidence of drainable fluid collection or abscess.   IMPRESSION: 1. Well-defined area of decreased perfusion with  peripheral enhancement in the gluteus maximus, rectus lateralis as well as in the biceps femoris muscle concerning for myositis/developing abscess. Clinical correlation and short-term follow-up with CT with contrast or sonogram would be helpful.   2. Marked subcutaneous soft tissue edema about the lateral aspect of the thigh.     Electronically Signed   By: Keane Police D.O.   On: 12/02/2021 19:38  Assessment:     Left leg myositis. AKI Hx of cocaine abuse rhabdomyolysis   Plan:    nothing surgical at this standpoint due to vague findings noted on CT.  No concern for developing infection, necrotizing fasciitis.  Disucssed the imaging with radiologist and  the areas of concern are likely sequale from presumed rhabdomyolysis/myositis.  Surgical exploration risk outweigh any benefit at this time, especially given his AKI.  Recommend continuing supportive care for now per primary, and monitor for any worsening symptoms.  Surgery to sign off for now.  Please call with any questions or concerns  labs/images/medications/previous chart entries reviewed personally and relevant changes/updates noted above.

## 2021-12-07 NOTE — Progress Notes (Signed)
PROGRESS NOTE  Edwin [name]    DOB: [birth year], 36 y.o.  [id number]  PCP: Pcp, No   Code Status: Full Code   DOA: 12/01/2021   LOS: 6  Brief Narrative of Current Hospitalization  Edwin [name] is a 36 y.o. male with a PMH significant for polysubstance abuse. They presented from home to the ED on 12/01/2021 with AMS.  In the ED, it was found that they had acute metabolic encephalopathy in the setting of multiple severe metabolic derangements (AKI with Hyperkalemia, Rhabdomyolysis, liver failure) plus cocaine use.  Also found to have elevated troponin. Originally admitted to [facility]. Nephrology, ortho surgery, general surgery and Cardiology following.  12/07/21 -stable, slight improvement Cr  Assessment & Plan  Principal Problem:   Acute metabolic encephalopathy Active Problems:   Non-ST elevation (NSTEMI) myocardial infarction (HCC)   Cocaine abuse (HCC)   Cocaine poisoning ([place])   Rhabdomyolysis   ATN (acute tubular necrosis) (HCC)   Hyperkalemia   Myositis   Acute hepatitis   Acute renal failure (HCC)   Elevated LFTs   Hypermagnesemia   Infection   Chronic hepatitis C without hepatic coma (HCC)  Severe Left thigh myositis with accompanying proximal fasciitis  Left hip and Knee Joint Effusions concerning for Septic joints Patient had increased pain overnight, per nurse report. Evaluated by overnight coverage and appeared comfortable. Repeat CT showed changes that were discussed with general surgery and radiology to be most consistent with post-infectious/ residual changes. There was no evidence of gas or abscess formation or of osteomyelitis. Patient was continued on his already ordered pain medications.  This morning, patient recently received his pain medication and was fast asleep on initial exam. Did not appreciate any new abnormality with his thigh and he was not experiencing pain currently.  - ID following - discontinued vancomycin - discontinue zosyn 2/4 - started CTX  (2/4-2/6), flagyl 2/4- - started cefazolin 2/6 - follow cultures. Repeat 2/2 are NGTD -ortho surgery following  -  s/p joint aspiration 2/2 not consistent with septic joint. Joint fluid culture NGTD - analgesia PRN  Sudden onset Bilateral hearing loss- improving -Likely drug induced (?speed-balling) in a patient with significant history of IV drug use. -ENT f/u OP  Acute Metabolic Encephalopathy in setting of multiple metabolic derangements & cocaine use ~ IMPROVING. CT head negative -Provide supportive care -Treat metabolic derangements as below -UDS + for cocaine and Benzodiazepines - psychiatry evaluated   Acute Kidney Injury- Cr 3.28>>4.76>6.10>7.07>7.7>8.23>8.00 today.  Rhabdomyolysis- CK from >50,00 to 11,555> [id number]. (Do not need to trend further) Severe Hyperkalemia ~ RESOLVED Hyponatremia- resolved -Monitor I&O's / urinary output -Follow BMP -Ensure adequate renal perfusion -Avoid nephrotoxic agents as able -Replace electrolytes as indicated -IV fluids- now on LR -Nephrology following, appreciate input  - likely to require renal replacement therapy  - bicarb PO   Elevated Troponin, demand ischemia vs NSTEMI in setting of Cocaine abuse Prolonged Qtc   HTN -cardiology signed off 2/3 - increase imdur to 60mg  daily - completed heparin x48 hours  - Continuous cardiac monitoring -Maintain MAP >65 -IV fluids -Vasopressors as needed to maintain MAP goal   Hepatitis C- likely chronic. Ab positive, viral quant is 900 - f/u OP ID for treatment  DVT prophylaxis: sq heparin  Diet:  Diet Orders (From admission, onward)     Start     Ordered   12/01/21 1040  Diet regular Room service appropriate? Yes; Fluid consistency: Thin  Diet effective now       Question Answer  Comment  Room service appropriate? Yes   Fluid consistency: Thin      12/01/21 1039            Subjective 12/07/21    Pt reports no complaints today. He is tired.   Disposition Plan &  Communication  Patient status: Inpatient  Admitted From: Home Disposition: now home health Anticipated discharge date: TBD, based on kidney function recovery  Family Communication: none  Consults, Procedures, Significant Events  Consultants:  ID Ortho [facility] Nephrology Cardiology   Procedures/significant events:  Joint aspiration  Antimicrobials:  Anti-infectives (From admission, onward)    Start     Dose/Rate Route Frequency Ordered Stop   12/07/21 1000  ceFAZolin (ANCEF) IVPB 1 g/50 mL premix        1 g 100 mL/hr over 30 Minutes Intravenous Every 12 hours 12/06/21 1634     12/04/21 2200  metroNIDAZOLE (FLAGYL) IVPB 500 mg        500 mg 100 mL/hr over 60 Minutes Intravenous Every 12 hours 12/04/21 2044     12/04/21 1100  cefTRIAXone (ROCEPHIN) 2 g in sodium chloride 0.9 % 100 mL IVPB  Status:  Discontinued        2 g 200 mL/hr over 30 Minutes Intravenous Every 24 hours 12/04/21 0928 12/06/21 1634   12/03/21 2200  piperacillin-tazobactam (ZOSYN) IVPB 3.375 g  Status:  Discontinued        3.375 g 12.5 mL/hr over 240 Minutes Intravenous Every 12 hours 12/03/21 1324 12/04/21 0926   12/01/21 2200  clindamycin (CLEOCIN) IVPB 600 mg  Status:  Discontinued        600 mg 100 mL/hr over 30 Minutes Intravenous Every 8 hours 12/01/21 2010 12/03/21 1320   12/01/21 2130  piperacillin-tazobactam (ZOSYN) IVPB 3.375 g  Status:  Discontinued        3.375 g 12.5 mL/hr over 240 Minutes Intravenous Every 8 hours 12/01/21 2041 12/03/21 1324   12/01/21 2115  vancomycin (VANCOREADY) IVPB 2000 mg/400 mL        2,000 mg 200 mL/hr over 120 Minutes Intravenous  Once 12/01/21 2022 12/02/21 0059   12/01/21 2042  vancomycin variable dose per unstable renal function (pharmacist dosing)  Status:  Discontinued         Does not apply See admin instructions 12/01/21 2042 12/02/21 2341   12/01/21 2030  ceFEPIme (MAXIPIME) 2 g in sodium chloride 0.9 % 100 mL IVPB  Status:  Discontinued        2 g 200 mL/hr  over 30 Minutes Intravenous Every 12 hours 12/01/21 2022 12/01/21 2030       Objective   Vitals:   12/06/21 0736 12/06/21 1539 12/06/21 2035 12/07/21 0551  BP: (!) 156/81 138/81 (!) 147/80 (!) 146/88  Pulse: 64 (!) 53 (!) 52 (!) 50  Resp: 15 15 18 20   Temp: 98 F (36.7 C) 98 F (36.7 C) 97.9 F (36.6 C)   TempSrc: Oral Oral Oral   SpO2: 99% 100% 100% 100%  Weight:      Height:        Intake/Output Summary (Last 24 hours) at 12/07/2021 0700 Last data filed at 12/07/2021 [id number] Gross per 24 hour  Intake 4960.42 ml  Output 3350 ml  Net 1610.42 ml    Filed Weights   12/02/21 0500 12/03/21 0138 12/03/21 2052  Weight: 85.9 kg 90 kg 97.8 kg    Patient BMI: Body mass index is 32.78 kg/m.   Physical Exam:  General: asleep to alert,  NAD HEENT: atraumatic, clear conjunctiva, anicteric sclera, MMM, hearing grossly normal Respiratory: normal respiratory effort. Cardiovascular: quick capillary refill  Nervous: A&O x3. Extremities: moves all equally, no edema, normal tone Skin: dry, intact, normal temperature, normal color. No rashes, lesions or ulcers on exposed skin Psychiatry: flat mood, congruent affect  Labs   I have personally reviewed following labs and imaging studies CBC    Component Value Date/Time   WBC 10.9 (H) 12/06/2021 0616   RBC 4.29 12/06/2021 0616   HGB 13.5 12/06/2021 0616   HCT 38.2 (L) 12/06/2021 0616   PLT 241 12/06/2021 0616   MCV 89.0 12/06/2021 0616   MCH 31.5 12/06/2021 0616   MCHC 35.3 12/06/2021 0616   RDW 12.8 12/06/2021 0616   LYMPHSABS 2.3 12/06/2021 0616   MONOABS 1.4 (H) 12/06/2021 0616   EOSABS 0.2 12/06/2021 0616   BASOSABS 0.1 12/06/2021 0616   BMP Latest Ref Rng & Units 12/07/2021 12/06/2021 12/05/2021  Glucose 70 - 99 mg/dL 111(H) 102(H) 95  BUN 6 - 20 mg/dL 86(H) 80(H) 77(H)  Creatinine 0.61 - 1.24 mg/dL 8.00(H) 8.23(H) 7.70(H)  Sodium 135 - 145 mmol/L 136 135 135  Potassium 3.5 - 5.1 mmol/L 5.1 4.8 4.7  Chloride 98 - 111 mmol/L 109  108 109  CO2 22 - 32 mmol/L 19(L) 18(L) 17(L)  Calcium 8.9 - 10.3 mg/dL 8.1(L) 7.9(L) 7.5(L)   Imaging Studies  CT EXTREMITY LOWER LEFT WO CONTRAST  Result Date: 12/06/2021 CLINICAL DATA:  Left lower leg swelling. Renal insufficiency. Not on dialysis. Polysubstance abuse. Changes of myositis and fasciitis on recent CT and MRI examinations. EXAM: CT OF THE LOWER LEFT EXTREMITY WITHOUT CONTRAST TECHNIQUE: Multidetector CT imaging of the lower left extremity was performed according to the standard protocol. RADIATION DOSE REDUCTION: This exam was performed according to the departmental dose-optimization program which includes automated exposure control, adjustment of the mA and/or kV according to patient size and/or use of iterative reconstruction technique. COMPARISON:  MRI of the left lower extremity dated 12/02/2021. CT of the left lower extremity dated 12/01/2021. FINDINGS: Bones/Joint/Cartilage No bone destruction or periosteal reaction. Small to moderate-sized knee effusion with progression. Ligaments Suboptimally assessed by CT. Muscles and Tendons The previously demonstrated 3.2 x 2.4 cm area of low density in the left biceps femoris muscle currently measures 3.0 x 2.2 cm on image number 29/5. There is interval better defined low density in the left vastus lateralis muscle, measuring 9.3 x 8.1 cm on image number 24/5. Interval better defined low density in the lateral aspect of the gluteus maximus muscle, measuring 4.9 x 3.0 cm on image number 144/5. Interval low density in the posterior aspect of the medial rectus muscle measuring 4.6 x 1.7 cm on image number 91/5. Crescentic low density in the lateral aspect of the gluteus medius muscle with adjacent subfascial fluid. The subfascial fluid has increased and is more confluent without evidence of abscess. Soft tissues Extensive subcutaneous edema at the level of the pelvis and throughout the proximal and extending laterally in the lower leg. No areas of  muscle low density in the lower leg. Fluid density between muscles in the superior aspect of the left thigh mild overall progression. No soft tissue gas. IMPRESSION: 1. Mildly progressive changes of myositis and fasciitis involving the upper left leg and buttock regions with increased soft tissue edema. 2. No discrete fluid collections suspicious for abscess visualized. 3. No soft tissue gas or bony changes of osteomyelitis. 4. Progressive knee effusion. Electronically Signed   By:  Claudie Revering M.D.   On: 12/06/2021 20:45    Medications   Scheduled Meds:  heparin injection (subcutaneous)  5,000 Units Subcutaneous Q8H   isosorbide mononitrate  60 mg Oral Daily   pantoprazole  40 mg Oral Daily   sodium bicarbonate  650 mg Oral TID   No recently discontinued medications to reconcile  LOS: 6 days   Richarda Osmond, DO [facility] 12/07/2021, 7:00 AM   Available by Epic secure chat 7AM-7PM. If 7PM-7AM, please contact night-coverage Refer to [email or web address] to contact the [facility] Attending or Consulting provider for this pt

## 2021-12-07 NOTE — Progress Notes (Signed)
PT asleep, will try again.

## 2021-12-07 NOTE — Progress Notes (Signed)
[facility]  PROGRESS NOTE   Subjective:   Patient sitting up in bed Remains drowsy Tolerating meals without nausea and vomiting Denies shortness of breath Continues to complain of pain and discomfort in lower legs  Creatinine 8 Urine output 3.3L in past 24 hours IVF LR @100ml /hr  Objective:  Vital signs in last 24 hours:  Temp:  [97.7 F (36.5 C)-98 F (36.7 C)] 97.7 F (36.5 C) (02/07 0805) Pulse Rate:  [50-78] 78 (02/07 0805) Resp:  [15-20] 18 (02/07 0805) BP: (138-149)/(80-107) 149/107 (02/07 0805) SpO2:  [96 %-100 %] 96 % (02/07 0805)  Weight change:  Filed Weights   12/02/21 0500 12/03/21 0138 12/03/21 2052  Weight: 85.9 kg 90 kg 97.8 kg    Intake/Output: I/O last 3 completed shifts: In: 6241.5 [P.O.:1440; I.V.:3653.4; IV Piggyback:1148] Out: 5101 [Urine:5100; Stool:1]   Intake/Output this shift:  Total I/O In: 667.4 [P.O.:240; I.V.:427.4] Out: -   Physical Exam: General:  No acute distress  Head:  Normocephalic, atraumatic. Moist oral mucosal membranes  Eyes:  Anicteric  Lungs:   Clear to auscultation, normal effort  Heart:  S1S2 no rubs  Abdomen:   Soft, nontender, bowel sounds present  Extremities:  No peripheral edema.  Neurologic:  Awake, alert, following commands  Skin:  No lesions       Basic Metabolic Panel: Recent Labs  Lab 12/01/21 0607 12/01/21 1432 12/03/21 0455 12/04/21 0558 12/05/21 0406 12/06/21 0616 12/07/21 0318  NA 133*   < > 132* 133* 135 135 136  K 6.6*   < > 4.5 4.4 4.7 4.8 5.1  CL 96*   < > 105 105 109 108 109  CO2 22   < > 17* 19* 17* 18* 19*  GLUCOSE 210*   < > 110* 101* 95 102* 111*  BUN 46*   < > 76* 77* 77* 80* 86*  CREATININE 3.28*   < > 6.10* 7.07* 7.70* 8.23* 8.00*  CALCIUM 7.3*   < > 6.9* 7.5* 7.5* 7.9* 8.1*  MG 2.6*  --  2.0  --  2.0  --   --   PHOS  --   --  6.3*  --  6.6* 6.7* 7.0*   < > = values in this interval not displayed.     CBC: Recent Labs  Lab 12/01/21 0607 12/02/21 0855  12/03/21 0455 12/06/21 0616  WBC 21.4* 18.3* 13.1* 10.9*  NEUTROABS 18.7*  --   --  6.8  HGB 15.9 15.0 12.4* 13.5  HCT 47.3 43.9 34.8* 38.2*  MCV 92.2 90.3 89.5 89.0  PLT 329 237 205 241      Urinalysis: No results for input(s): COLORURINE, LABSPEC, PHURINE, GLUCOSEU, HGBUR, BILIRUBINUR, KETONESUR, PROTEINUR, UROBILINOGEN, NITRITE, LEUKOCYTESUR in the last 72 hours.  Invalid input(s): APPERANCEUR    Imaging: CT EXTREMITY LOWER LEFT WO CONTRAST  Result Date: 12/06/2021 CLINICAL DATA:  Left lower leg swelling. Renal insufficiency. Not on dialysis. Polysubstance abuse. Changes of myositis and fasciitis on recent CT and MRI examinations. EXAM: CT OF THE LOWER LEFT EXTREMITY WITHOUT CONTRAST TECHNIQUE: Multidetector CT imaging of the lower left extremity was performed according to the standard protocol. RADIATION DOSE REDUCTION: This exam was performed according to the departmental dose-optimization program which includes automated exposure control, adjustment of the mA and/or kV according to patient size and/or use of iterative reconstruction technique. COMPARISON:  MRI of the left lower extremity dated 12/02/2021. CT of the left lower extremity dated 12/01/2021. FINDINGS: Bones/Joint/Cartilage No bone destruction or periosteal reaction.  Small to moderate-sized knee effusion with progression. Ligaments Suboptimally assessed by CT. Muscles and Tendons The previously demonstrated 3.2 x 2.4 cm area of low density in the left biceps femoris muscle currently measures 3.0 x 2.2 cm on image number 29/5. There is interval better defined low density in the left vastus lateralis muscle, measuring 9.3 x 8.1 cm on image number 24/5. Interval better defined low density in the lateral aspect of the gluteus maximus muscle, measuring 4.9 x 3.0 cm on image number 144/5. Interval low density in the posterior aspect of the medial rectus muscle measuring 4.6 x 1.7 cm on image number 91/5. Crescentic low density in the  lateral aspect of the gluteus medius muscle with adjacent subfascial fluid. The subfascial fluid has increased and is more confluent without evidence of abscess. Soft tissues Extensive subcutaneous edema at the level of the pelvis and throughout the proximal and extending laterally in the lower leg. No areas of muscle low density in the lower leg. Fluid density between muscles in the superior aspect of the left thigh mild overall progression. No soft tissue gas. IMPRESSION: 1. Mildly progressive changes of myositis and fasciitis involving the upper left leg and buttock regions with increased soft tissue edema. 2. No discrete fluid collections suspicious for abscess visualized. 3. No soft tissue gas or bony changes of osteomyelitis. 4. Progressive knee effusion. Electronically Signed   By: Claudie Revering M.D.   On: 12/06/2021 20:45     Medications:    sodium chloride Stopped (12/05/21 1134)    ceFAZolin (ANCEF) IV 1 g (12/07/21 1037)   lactated ringers 50 mL/hr at 12/07/21 1055   metronidazole 500 mg (12/07/21 0802)    heparin injection (subcutaneous)  5,000 Units Subcutaneous Q8H   isosorbide mononitrate  60 mg Oral Daily   pantoprazole  40 mg Oral Daily   sodium bicarbonate  650 mg Oral TID    Assessment/ Plan:     Principal Problem:   Acute metabolic encephalopathy Active Problems:   Non-ST elevation (NSTEMI) myocardial infarction (HCC)   Cocaine abuse (HCC)   Cocaine poisoning (HCC)   Rhabdomyolysis   ATN (acute tubular necrosis) (HCC)   Hyperkalemia   Myositis   Acute hepatitis   Acute renal failure (HCC)   Elevated LFTs   Hypermagnesemia   Infection   Chronic hepatitis C without hepatic coma ([place])  36 year old male with history of drug abuse admitted for mental status changes.  He was found to have non-ST elevated MI, severe rhabdomyolysis and acute kidney injury.  #1: Acute kidney injury: Likely due to severe rhabdomyolysis. Creatinine has plateaued today at 8. Will decrease  IVF to LR 7ml/hr and encouraged increased oral intake. Will continue to monitor renal function. No acute need for dialysis at this time.  #2: Rhabdomyolysis/severe myositis: CK reduced to [id number]  #3: Metabolic encephalopathy: Secondary to drug overdose and drug abuse.    LOS: [facility] 2/7/202311:16 AM

## 2021-12-07 NOTE — Progress Notes (Signed)
°  Chaplain On-Call attempted to visit with the patient to provide him with the Advance Directives documents that he requested.  Physical Therapist and Nurse stated that the patient has been in much pain, and is now resting comfortably.  They asked that a Chaplain visit the patient this afternoon.  This Chaplain will refer to the Afternoon Chaplain for follow-up.  Chaplain Evelena Peat M.Div., [facility]

## 2021-12-08 LAB — RENAL FUNCTION PANEL
Albumin: 2.5 g/dL — ABNORMAL LOW (ref 3.5–5.0)
Anion gap: 8 (ref 5–15)
BUN: 85 mg/dL — ABNORMAL HIGH (ref 6–20)
CO2: 19 mmol/L — ABNORMAL LOW (ref 22–32)
Calcium: 8.4 mg/dL — ABNORMAL LOW (ref 8.9–10.3)
Chloride: 110 mmol/L (ref 98–111)
Creatinine, Ser: 8.28 mg/dL — ABNORMAL HIGH (ref 0.61–1.24)
GFR, Estimated: 8 mL/min — ABNORMAL LOW (ref >60)
Glucose, Bld: 99 mg/dL (ref 70–99)
Phosphorus: 6.7 mg/dL — ABNORMAL HIGH (ref 2.5–4.6)
Potassium: 5.8 mmol/L — ABNORMAL HIGH (ref 3.5–5.1)
Sodium: 137 mmol/L (ref 135–145)

## 2021-12-08 MED ORDER — ACETAMINOPHEN 325 MG PO TABS: 650.0000 mg | ORAL_TABLET | Freq: Four times a day (QID) | ORAL | Status: DC | PRN

## 2021-12-08 MED ORDER — SODIUM ZIRCONIUM CYCLOSILICATE 10 G PO PACK
10.0000 g | PACK | Freq: Once | ORAL | Status: AC
  Administered 2021-12-08: 10 g via ORAL
  Filled 2021-12-08: qty 1

## 2021-12-08 MED ORDER — BUTALBITAL-APAP-CAFFEINE 50-325-40 MG PO TABS
1.0000 | ORAL_TABLET | Freq: Four times a day (QID) | ORAL | Status: DC | PRN
  Administered 2021-12-08 – 2021-12-09 (×3): 1 via ORAL
  Filled 2021-12-08 (×3): qty 1

## 2021-12-08 MED ORDER — SODIUM BICARBONATE 8.4 % IV SOLN
INTRAVENOUS | Status: DC
  Filled 2021-12-08 (×4): qty 1000

## 2021-12-08 NOTE — Progress Notes (Signed)
PT Cancellation Note  Patient Details Name: Edwin [name] MRN: [id number] DOB: [birth year]   Cancelled Treatment:    Reason Eval/Treat Not Completed: Medical issues which prohibited therapy  Potassium 5.8 this am on morning lab draw.  Level is far outside of parameters with no treatment to correct at this time.  Will hold session per protocol and continue as appropriate.   [name] 12/08/2021, 9:44 AM

## 2021-12-08 NOTE — Progress Notes (Addendum)
[facility]  PROGRESS NOTE   Subjective:   Patient drowsy  Denies nausea and vomiting Denies shortness of breath Reports continued pain in lower extremities.   Creatinine 8.28 Urine output 4.5L in past 24 hours IVF LR @ 50 ml/hr  Objective:  Vital signs in last 24 hours:  Temp:  [98.3 F (36.8 C)-98.8 F (37.1 C)] 98.3 F (36.8 C) (02/08 0726) Pulse Rate:  [70-77] 70 (02/08 0726) Resp:  [16-20] 16 (02/08 0726) BP: (145-168)/(79-84) 168/84 (02/08 0726) SpO2:  [97 %-100 %] 99 % (02/08 0726)  Weight change:  Filed Weights   12/02/21 0500 12/03/21 0138 12/03/21 2052  Weight: 85.9 kg 90 kg 97.8 kg    Intake/Output: I/O last 3 completed shifts: In: 4263.1 [P.O.:1440; I.V.:2276.4; IV Piggyback:546.6] Out: 6500 [Urine:6500]   Intake/Output this shift:  Total I/O In: 0  Out: 400 [Urine:400]  Physical Exam: General:  No acute distress  Head:  Normocephalic, atraumatic. Moist oral mucosal membranes  Eyes:  Anicteric  Lungs:   Clear to auscultation, normal effort  Heart:  S1S2 no rubs  Abdomen:   Soft, nontender, bowel sounds present  Extremities:  No peripheral edema.  Neurologic:  Awake, alert, following commands  Skin:  No lesions       Basic Metabolic Panel: Recent Labs  Lab 12/03/21 0455 12/04/21 0558 12/05/21 0406 12/06/21 0616 12/07/21 0318 12/08/21 0312  NA 132* 133* 135 135 136 137  K 4.5 4.4 4.7 4.8 5.1 5.8*  CL 105 105 109 108 109 110  CO2 17* 19* 17* 18* 19* 19*  GLUCOSE 110* 101* 95 102* 111* 99  BUN 76* 77* 77* 80* 86* 85*  CREATININE 6.10* 7.07* 7.70* 8.23* 8.00* 8.28*  CALCIUM 6.9* 7.5* 7.5* 7.9* 8.1* 8.4*  MG 2.0  --  2.0  --   --   --   PHOS 6.3*  --  6.6* 6.7* 7.0* 6.7*     CBC: Recent Labs  Lab 12/02/21 0855 12/03/21 0455 12/06/21 0616  WBC 18.3* 13.1* 10.9*  NEUTROABS  --   --  6.8  HGB 15.0 12.4* 13.5  HCT 43.9 34.8* 38.2*  MCV 90.3 89.5 89.0  PLT 237 205 241      Urinalysis: No results for input(s):  COLORURINE, LABSPEC, PHURINE, GLUCOSEU, HGBUR, BILIRUBINUR, KETONESUR, PROTEINUR, UROBILINOGEN, NITRITE, LEUKOCYTESUR in the last 72 hours.  Invalid input(s): APPERANCEUR    Imaging: CT EXTREMITY LOWER LEFT WO CONTRAST  Result Date: 12/06/2021 CLINICAL DATA:  Left lower leg swelling. Renal insufficiency. Not on dialysis. Polysubstance abuse. Changes of myositis and fasciitis on recent CT and MRI examinations. EXAM: CT OF THE LOWER LEFT EXTREMITY WITHOUT CONTRAST TECHNIQUE: Multidetector CT imaging of the lower left extremity was performed according to the standard protocol. RADIATION DOSE REDUCTION: This exam was performed according to the departmental dose-optimization program which includes automated exposure control, adjustment of the mA and/or kV according to patient size and/or use of iterative reconstruction technique. COMPARISON:  MRI of the left lower extremity dated 12/02/2021. CT of the left lower extremity dated 12/01/2021. FINDINGS: Bones/Joint/Cartilage No bone destruction or periosteal reaction. Small to moderate-sized knee effusion with progression. Ligaments Suboptimally assessed by CT. Muscles and Tendons The previously demonstrated 3.2 x 2.4 cm area of low density in the left biceps femoris muscle currently measures 3.0 x 2.2 cm on image number 29/5. There is interval better defined low density in the left vastus lateralis muscle, measuring 9.3 x 8.1 cm on image number 24/5. Interval better defined low  density in the lateral aspect of the gluteus maximus muscle, measuring 4.9 x 3.0 cm on image number 144/5. Interval low density in the posterior aspect of the medial rectus muscle measuring 4.6 x 1.7 cm on image number 91/5. Crescentic low density in the lateral aspect of the gluteus medius muscle with adjacent subfascial fluid. The subfascial fluid has increased and is more confluent without evidence of abscess. Soft tissues Extensive subcutaneous edema at the level of the pelvis and  throughout the proximal and extending laterally in the lower leg. No areas of muscle low density in the lower leg. Fluid density between muscles in the superior aspect of the left thigh mild overall progression. No soft tissue gas. IMPRESSION: 1. Mildly progressive changes of myositis and fasciitis involving the upper left leg and buttock regions with increased soft tissue edema. 2. No discrete fluid collections suspicious for abscess visualized. 3. No soft tissue gas or bony changes of osteomyelitis. 4. Progressive knee effusion. Electronically Signed   By: Claudie Revering M.D.   On: 12/06/2021 20:45     Medications:    sodium chloride Stopped (12/05/21 1134)    ceFAZolin (ANCEF) IV 1 g (12/08/21 0947)   lactated ringers 50 mL/hr at 12/07/21 1752   metronidazole 500 mg (12/08/21 1051)    heparin injection (subcutaneous)  5,000 Units Subcutaneous Q8H   isosorbide mononitrate  60 mg Oral Daily   pantoprazole  40 mg Oral Daily   sodium bicarbonate  650 mg Oral TID    Assessment/ Plan:     Principal Problem:   Acute metabolic encephalopathy Active Problems:   Non-ST elevation (NSTEMI) myocardial infarction (HCC)   Cocaine abuse (HCC)   Cocaine poisoning (HCC)   Rhabdomyolysis   ATN (acute tubular necrosis) (HCC)   Hyperkalemia   Myositis   Acute hepatitis   Acute renal failure (HCC)   Elevated LFTs   Hypermagnesemia   Infection   Chronic hepatitis C without hepatic coma ([place])  36 year old male with history of drug abuse admitted for mental status changes.  He was found to have non-ST elevated MI, severe rhabdomyolysis and acute kidney injury.  #1: Acute kidney injury: Likely due to severe rhabdomyolysis. Creatinine has increased slightly today. Urine output remains adequate at 4.5L recorded in proceeding 24 hours. BUN continues to slowly increase, concerning for uremia. Considering initiation of dialysis for clearance of symptoms.    #2: Rhabdomyolysis/severe myositis: CK  improving  #3: Metabolic encephalopathy: Secondary to drug overdose and drug abuse. Remains somnolent and drowsy.   #4.  Hyperkalemia. Potassium 5.8. Lokelma 10g oral once given.    LOS: [facility] 2/8/202311:24 AM

## 2021-12-08 NOTE — Progress Notes (Signed)
PROGRESS NOTE    Edwin [name]  [id number] DOB: August 15, 1986 DOA: 12/01/2021 PCP: Pcp, No   Assessment & Plan:   Principal Problem:   Acute metabolic encephalopathy Active Problems:   Non-ST elevation (NSTEMI) myocardial infarction (HCC)   Cocaine abuse ([place])   Cocaine poisoning ([place])   Rhabdomyolysis   ATN (acute tubular necrosis) (HCC)   Hyperkalemia   Myositis   Acute hepatitis   Acute renal failure (HCC)   Elevated LFTs   Hypermagnesemia   Infection   Chronic hepatitis C without hepatic coma (HCC)  Left thigh myositis/ left hip & knee joint effusions: continue on IV cefazolin, flagyl until 12/08/21 as per ID. S/p join aspiration, not consistent w/ septic joint. Joint fluid cx NGTD  B/l hearing loss: acute & improving. Likely drug induced. Hx of IV drug use. Drug screen was positive for cocaine, benzos  Acute metabolic encephalopathy: likely secondary to cocaine drug use & likely uremia   AKI: Cr is rising daily. Likely will need HD. Nephro following and recs apprec  Rhabdomyolysis: CK is labile. Likely contributing to AKI.   Hyperkalemia: lokelma x1 ordered   Hyponatremia: resolved   Elevated troponin: likely secondary to demand ischemia as per cardio. Drug screen was positive for cocaine. Cardio recs apprec  HCV: will need outpatient f/u for treatment     DVT prophylaxis: heparin  Code Status: full  Family Communication:  Disposition Plan: likely d/c back home   Status is: Inpatient Remains inpatient appropriate because: Cr is trending up daily       Level of care: Med-Surg  Consultants:  Urology Gen surg  ID   Procedures:   Antimicrobials: cefazolin, flagyl    Subjective: Pt c/o malaise.   Objective: Vitals:   12/07/21 1700 12/07/21 1944 12/08/21 0411 12/08/21 0726  BP: (!) 145/80 (!) 156/79 (!) 154/82 (!) 168/84  Pulse: 72 72 77 70  Resp: 20 20 20 16   Temp: 98.4 F (36.9 C) 98.8 F (37.1 C) 98.5 F (36.9 C) 98.3 F (36.8 C)   TempSrc:  Oral Oral Oral  SpO2: 97% 100% 100% 99%  Weight:      Height:        Intake/Output Summary (Last 24 hours) at 12/08/2021 0748 Last data filed at 12/08/2021 [id number] Gross per 24 hour  Intake 2028.71 ml  Output 4500 ml  Net -2471.29 ml   Filed Weights   12/02/21 0500 12/03/21 0138 12/03/21 2052  Weight: 85.9 kg 90 kg 97.8 kg    Examination:  General exam: Appears lethargic  Respiratory system: Clear to auscultation. Respiratory effort normal. Cardiovascular system: S1 & S2 +. No rubs, gallops or clicks.  Gastrointestinal system: Abdomen is nondistended, soft and nontender. Normal bowel sounds heard. Central nervous system: Lethargic. Moves all extremities  Psychiatry: Judgement and insight appear normal.  Flat mood and affect    Data Reviewed: I have personally reviewed following labs and imaging studies  CBC: Recent Labs  Lab 12/02/21 0855 12/03/21 0455 12/06/21 0616  WBC 18.3* 13.1* 10.9*  NEUTROABS  --   --  6.8  HGB 15.0 12.4* 13.5  HCT 43.9 34.8* 38.2*  MCV 90.3 89.5 89.0  PLT 237 205 [id number]   Basic Metabolic Panel: Recent Labs  Lab 12/03/21 0455 12/04/21 0558 12/05/21 0406 12/06/21 0616 12/07/21 0318 12/08/21 0312  NA 132* 133* 135 135 136 137  K 4.5 4.4 4.7 4.8 5.1 5.8*  CL 105 105 109 108 109 110  CO2 17* 19* 17* 18* 19* 19*  GLUCOSE 110* 101* 95 102* 111* 99  BUN 76* 77* 77* 80* 86* 85*  CREATININE 6.10* 7.07* 7.70* 8.23* 8.00* 8.28*  CALCIUM 6.9* 7.5* 7.5* 7.9* 8.1* 8.4*  MG 2.0  --  2.0  --   --   --   PHOS 6.3*  --  6.6* 6.7* 7.0* 6.7*   GFR: Estimated Creatinine Clearance: 14 mL/min (A) (by C-G formula based on SCr of 8.28 mg/dL (H)). Liver Function Tests: Recent Labs  Lab 12/02/21 0855 12/03/21 0455 12/04/21 0558 12/05/21 0406 12/06/21 0616 12/07/21 0318 12/08/21 0312  AST 711* 456* 292* 185* 119*  --   --   ALT 634* 648* 553* 369* 267*  --   --   ALKPHOS 80 63 61 47 48  --   --   BILITOT 0.6 0.5 0.7 0.5 0.5  --   --   PROT  5.7* 5.3* 5.6* 5.2* 5.7*  --   --   ALBUMIN 2.5* 2.2* 2.2* 2.1* 2.4* 2.4* 2.5*   No results for input(s): LIPASE, AMYLASE in the last 168 hours. No results for input(s): AMMONIA in the last 168 hours. Coagulation Profile: Recent Labs  Lab 12/01/21 0846  INR 1.3*   Cardiac Enzymes: Recent Labs  Lab 12/02/21 0855 12/03/21 0455 12/04/21 0558 12/06/21 0616 12/06/21 2149  CKTOTAL 19,295* 11,555* 5,948* 3,426* 2,599*   BNP (last 3 results) No results for input(s): PROBNP in the last 8760 hours. HbA1C: No results for input(s): HGBA1C in the last 72 hours. CBG: Recent Labs  Lab 12/01/21 0946  GLUCAP 123*   Lipid Profile: No results for input(s): CHOL, HDL, LDLCALC, TRIG, CHOLHDL, LDLDIRECT in the last 72 hours. Thyroid Function Tests: No results for input(s): TSH, T4TOTAL, FREET4, T3FREE, THYROIDAB in the last 72 hours. Anemia Panel: No results for input(s): VITAMINB12, FOLATE, FERRITIN, TIBC, IRON, RETICCTPCT in the last 72 hours. Sepsis Labs: Recent Labs  Lab 12/01/21 2147 12/02/21 0006 12/02/21 0855 12/03/21 0455 12/06/21 2149 12/07/21 0104  PROCALCITON 12.50  --  9.21 6.85  --   --   LATICACIDVEN 2.2* 1.6  --   --  0.9 1.0    Recent Results (from the past 240 hour(s))  Resp Panel by RT-PCR (Flu A&B, Covid) Nasopharyngeal Swab     Status: None   Collection Time: 12/01/21  8:16 AM   Specimen: Nasopharyngeal Swab; Nasopharyngeal(NP) swabs in vial transport medium  Result Value Ref Range Status   SARS Coronavirus 2 by RT PCR NEGATIVE NEGATIVE Final    Comment: (NOTE) SARS-CoV-2 target nucleic acids are NOT DETECTED.  The SARS-CoV-2 RNA is generally detectable in upper respiratory specimens during the acute phase of infection. The lowest concentration of SARS-CoV-2 viral copies this assay can detect is 138 copies/mL. A negative result does not preclude SARS-Cov-2 infection and should not be used as the sole basis for treatment or other patient management  decisions. A negative result may occur with  improper specimen collection/handling, submission of specimen other than nasopharyngeal swab, presence of viral mutation(s) within the areas targeted by this assay, and inadequate number of viral copies(<138 copies/mL). A negative result must be combined with clinical observations, patient history, and epidemiological information. The expected result is Negative.  Fact Sheet for Patients:  EntrepreneurPulse.com.au  Fact Sheet for Healthcare Providers:  IncredibleEmployment.be  This test is no t yet approved or cleared by the [place] FDA and  has been authorized for detection and/or diagnosis of SARS-CoV-2 by FDA under an Emergency Use Authorization (EUA). This EUA  will remain  in effect (meaning this test can be used) for the duration of the COVID-19 declaration under Section 564(b)(1) of the Act, 21 U.S.C.section 360bbb-3(b)(1), unless the authorization is terminated  or revoked sooner.       Influenza A by PCR NEGATIVE NEGATIVE Final   Influenza B by PCR NEGATIVE NEGATIVE Final    Comment: (NOTE) The Xpert Xpress SARS-CoV-2/FLU/RSV plus assay is intended as an aid in the diagnosis of influenza from Nasopharyngeal swab specimens and should not be used as a sole basis for treatment. Nasal washings and aspirates are unacceptable for Xpert Xpress SARS-CoV-2/FLU/RSV testing.  Fact Sheet for Patients: EntrepreneurPulse.com.au  Fact Sheet for Healthcare Providers: IncredibleEmployment.be  This test is not yet approved or cleared by the [place] FDA and has been authorized for detection and/or diagnosis of SARS-CoV-2 by FDA under an Emergency Use Authorization (EUA). This EUA will remain in effect (meaning this test can be used) for the duration of the COVID-19 declaration under Section 564(b)(1) of the Act, 21 U.S.C. section 360bbb-3(b)(1), unless the  authorization is terminated or revoked.  Performed at [facility], [address] [place], [place] [address]   MRSA Next Gen by PCR, Nasal     Status: None   Collection Time: 12/01/21 10:00 AM   Specimen: Urine, Catheterized; Nasal Swab  Result Value Ref Range Status   MRSA by PCR Next Gen NOT DETECTED NOT DETECTED Final    Comment: (NOTE) The GeneXpert MRSA Assay (FDA approved for NASAL specimens only), is one component of a comprehensive MRSA colonization surveillance program. It is not intended to diagnose MRSA infection nor to guide or monitor treatment for MRSA infections. Test performance is not FDA approved in patients less than 49 years old. Performed at [facility], [address], [place], [place] [address]   Urine Culture     Status: None   Collection Time: 12/01/21 10:10 AM   Specimen: Urine, Random  Result Value Ref Range Status   Specimen Description   Final    URINE, RANDOM Performed at [facility], [address] [place], [place] [address]    Special Requests   Final    NONE Performed at [facility], [address] [place], [place] [address]    Culture   Final    NO GROWTH Performed at [facility], [place] [address] [place], [place] [address]    Report Status 12/03/2021 FINAL  Final  CULTURE, BLOOD (ROUTINE X 2) w Reflex to ID Panel     Status: None   Collection Time: 12/01/21 10:13 AM   Specimen: BLOOD RIGHT HAND  Result Value Ref Range Status   Specimen Description BLOOD RIGHT HAND  Final   Special Requests   Final    BOTTLES DRAWN AEROBIC AND ANAEROBIC Blood Culture adequate volume   Culture   Final    NO GROWTH 5 DAYS Performed at [facility], [address] [place],  [address]    Report Status 12/06/2021 FINAL  Final  CULTURE, BLOOD (ROUTINE X 2) w Reflex to ID Panel     Status: None   Collection Time: 12/01/21  2:32 PM   Specimen: BLOOD  Result Value Ref Range Status    Specimen Description BLOOD [facility]  Final   Special Requests BOTTLES DRAWN AEROBIC AND ANAEROBIC BCAV  Final   Culture   Final    NO GROWTH 5 DAYS Performed at [facility], [address] [place],  [place] [address]    Report Status 12/06/2021 FINAL  Final  Body fluid culture w Gram Stain     Status: None   Collection Time: 12/02/21 11:25 AM   Specimen: PATH Cytology Misc. fluid; Synovial Fluid  Result Value Ref Range Status   Specimen Description   Final    HIP LEFT JOINT Performed at [facility], [address] [place], [place] [address]    Special Requests   Final    NONE Performed at [facility], [place], [place] [address]    Gram Stain   Final    FEW WBC PRESENT, PREDOMINANTLY MONONUCLEAR NO ORGANISMS SEEN    Culture   Final    NO GROWTH 3 DAYS Performed at [place] [address] [place], [place] [address]    Report Status 12/06/2021 FINAL  Final  Body fluid culture w Gram Stain     Status: None   Collection Time: 12/02/21  4:25 PM   Specimen: Synovium; Body Fluid  Result Value Ref Range Status   Specimen Description   Final    SYNOVIAL Performed at [facility], [address] [place], [place] [address]    Special Requests   Final    KNEE Performed at [facility], [address] [place], [place] [address]    Gram Stain   Final    RARE WBC PRESENT, PREDOMINANTLY MONONUCLEAR NO ORGANISMS SEEN    Culture   Final    NO GROWTH 3 DAYS Performed at [facility], [place] [address] [place],  [address]    Report Status 12/06/2021 FINAL  Final         Radiology Studies: CT EXTREMITY LOWER LEFT WO CONTRAST  Result Date: 12/06/2021 CLINICAL DATA:  Left lower leg swelling. Renal insufficiency. Not on dialysis. Polysubstance abuse. Changes of myositis and fasciitis on recent CT and MRI examinations. EXAM: CT OF THE LOWER LEFT EXTREMITY WITHOUT CONTRAST TECHNIQUE: Multidetector  CT imaging of the lower left extremity was performed according to the standard protocol. RADIATION DOSE REDUCTION: This exam was performed according to the departmental dose-optimization program which includes automated exposure control, adjustment of the mA and/or kV according to patient size and/or use of iterative reconstruction technique. COMPARISON:  MRI of the left lower extremity dated 12/02/2021. CT of the left lower extremity dated 12/01/2021. FINDINGS: Bones/Joint/Cartilage No bone destruction or periosteal reaction. Small to moderate-sized knee effusion with progression. Ligaments Suboptimally assessed by CT. Muscles and Tendons The previously demonstrated 3.2 x 2.4 cm area of low density in the left biceps femoris muscle currently measures 3.0 x 2.2 cm on image number 29/5. There is interval better defined low density in the left vastus lateralis muscle, measuring 9.3 x 8.1 cm on image number 24/5. Interval better defined low density in the lateral aspect of the gluteus maximus muscle, measuring 4.9 x 3.0 cm on image number 144/5. Interval low density in the posterior aspect of the medial rectus muscle measuring 4.6 x 1.7 cm on image number 91/5. Crescentic low density in the lateral aspect of the gluteus medius muscle with adjacent subfascial fluid. The subfascial fluid has increased and is more confluent without evidence of abscess. Soft tissues Extensive subcutaneous edema at the level of the pelvis and throughout the proximal and extending laterally in the lower leg. No areas of muscle low density in the lower leg. Fluid density between muscles in the superior aspect of the left thigh mild overall progression.  No soft tissue gas. IMPRESSION: 1. Mildly progressive changes of myositis and fasciitis involving the upper left leg and buttock regions with increased soft tissue edema. 2. No discrete fluid collections suspicious for abscess visualized. 3. No soft tissue gas or bony changes of osteomyelitis.  4. Progressive knee effusion. Electronically Signed   By: Claudie Revering M.D.   On: 12/06/2021 20:45        Scheduled Meds:  heparin injection (subcutaneous)  5,000 Units Subcutaneous Q8H   isosorbide mononitrate  60 mg Oral Daily   pantoprazole  40 mg Oral Daily   sodium bicarbonate  650 mg Oral TID   Continuous Infusions:  sodium chloride Stopped (12/05/21 1134)    ceFAZolin (ANCEF) IV 1 g (12/07/21 2235)   lactated ringers 50 mL/hr at 12/07/21 1752   metronidazole 500 mg (12/07/21 2122)     LOS: 7 days    Time spent: 25 mins     Wyvonnia Dusky, MD [facility] Pager [phone]  If 7PM-7AM, please contact night-coverage 12/08/2021, 7:48 AM

## 2021-12-08 NOTE — TOC Progression Note (Addendum)
Transition of Care [facility]) - Progression Note    Patient Details  Name: Edwin [name] MRN: [id number] Date of Birth: 1986-10-23  Transition of [facility]) CM/SW Contact  Maree Krabbe, LCSW Phone Number: 12/08/2021, 4:31 PM  Clinical Narrative:  CSW unsuccessful at finding a HH agency that will service pt due to insurance being out of network. In addition, due to pt's hx of drug use will be doubtful to find a SNF to accept pt.    Expected Discharge Plan: Skilled Nursing Facility Barriers to Discharge: Continued Medical Work up  Expected Discharge Plan and Services Expected Discharge Plan: Skilled Nursing Facility   Discharge Planning Services: CM Consult Post Acute Care Choice: Skilled Nursing Facility Living arrangements for the past 2 months: Single Family Home                                       Social Determinants of Health (SDOH) Interventions    Readmission Risk Interventions No flowsheet data found.

## 2021-12-08 NOTE — Progress Notes (Signed)
Occupational Therapy Treatment Patient Details Name: Edwin [name] MRN: [id number] DOB: Mar 25, 1986 Today's Date: 12/08/2021   History of present illness Pt is a 36 y/o M admitted on 12/01/21 after presenting to the ED with AMS. Pt was found to have acute metabolic encephalopathy in the setting of multiple severe metabolic derangements (AKI with Hyperkalemia, Rhabdomyolysis, liver failure) plus cocaine use.  Also found to have elevated troponin. Pt found to have Severe Left thigh myositis with accompanying proximal fasciitis, Left hip and Knee Joint Effusions concerning for Septic joints with ID & ortho surgery following. No orthopedic surgical intervention for septic left hip or knee joint planned as there is no evidence for septic joints at this time. PMH: polysubstance abuse   OT comments  Upon entering the room, pt supine in bed and sleeping sounds. Pt is lethargic and needing several attempts to awaken for OT intervention. Pt requesting assistance to bathroom for BM. Pt standing from standard height bed with min lifting assistance and use of RW. Pt needing increased cuing to for RW advancement and technique. Pt utilizing B UEs very heavily on RW. Pt able to have BM and perform hygiene while seated on commode. Commode lower than bed and needing mod lifting assist to stand. Pt returns to bed at end of session with mod A for management of B LEs. All needs within reach and pt requesting RN for medications. Pt continues to benefit from OT intervention. Bed alarm activated.    Recommendations for follow up therapy are one component of a multi-disciplinary discharge planning process, led by the attending physician.  Recommendations may be updated based on patient status, additional functional criteria and insurance authorization.    Follow Up Recommendations  Skilled nursing-short term rehab (<3 hours/day)    Assistance Recommended at Discharge Frequent or constant Supervision/Assistance  Patient can  return home with the following  A little help with walking and/or transfers;A little help with bathing/dressing/bathroom;Help with stairs or ramp for entrance;Assist for transportation   Equipment Recommendations  Other (comment) (RW)       Precautions / Restrictions Precautions Precautions: Fall Restrictions Weight Bearing Restrictions: Yes RLE Weight Bearing: Weight bearing as tolerated LLE Weight Bearing: Weight bearing as tolerated       Mobility Bed Mobility Overal bed mobility: Needs Assistance Bed Mobility: Sit to Supine, Supine to Sit     Supine to sit: Supervision, HOB elevated Sit to supine: Supervision, HOB elevated   General bed mobility comments: extra time to move LE to EOB    Transfers Overall transfer level: Needs assistance Equipment used: Rolling walker (2 wheels) Transfers: Sit to/from Stand Sit to Stand: Min assist                 Balance Overall balance assessment: Needs assistance Sitting-balance support: Feet supported Sitting balance-Leahy Scale: Good     Standing balance support: During functional activity, Bilateral upper extremity supported, Reliant on assistive device for balance Standing balance-Leahy Scale: Fair Standing balance comment: heavy use of BUE support 2/2 pain                           ADL either performed or assessed with clinical judgement   ADL Overall ADL's : Needs assistance/impaired                         Toilet Transfer: Moderate assistance;Regular Toilet;Ambulation;Rolling walker (2 wheels);Grab bars Toilet Transfer Details (indicate cue type and reason):  Min A to ambulate with RW but mod lifting assistance to stand from commode Toileting- Clothing Manipulation and Hygiene: Minimal assistance;Sit to/from stand       Functional mobility during ADLs: Min guard;Minimal assistance;Rolling walker (2 wheels)      Extremity/Trunk Assessment Upper Extremity Assessment Upper Extremity  Assessment: Overall WFL for tasks assessed   Lower Extremity Assessment Lower Extremity Assessment: Generalized weakness        Vision Patient Visual Report: No change from baseline            Cognition Arousal/Alertness: Lethargic, Suspect due to medications Behavior During Therapy: WFL for tasks assessed/performed Overall Cognitive Status: Within Functional Limits for tasks assessed                                 General Comments: Increased time to alert for participation                   Pertinent Vitals/ Pain       Pain Assessment Pain Assessment: 0-10 Pain Score: 7  Pain Location: B LEs Pain Descriptors / Indicators: Grimacing, Guarding, Aching Pain Intervention(s): Limited activity within patient's tolerance, Monitored during session, Repositioned            Progress Toward Goals  OT Goals(current goals can now be found in the care plan section)  Progress towards OT goals: Progressing toward goals  Acute Rehab OT Goals Patient Stated Goal: to go to bathroom OT Goal Formulation: With patient Time For Goal Achievement: 12/19/21 Potential to Achieve Goals: Good  Plan Discharge plan remains appropriate       AM-PAC OT "6 Clicks" Daily Activity     Outcome Measure   Help from another person eating meals?: None Help from another person taking care of personal grooming?: A Little Help from another person toileting, which includes using toliet, bedpan, or urinal?: A Lot Help from another person bathing (including washing, rinsing, drying)?: A Lot Help from another person to put on and taking off regular upper body clothing?: None Help from another person to put on and taking off regular lower body clothing?: A Lot 6 Click Score: 17    End of Session Equipment Utilized During Treatment: Rolling walker (2 wheels)  OT Visit Diagnosis: Unsteadiness on feet (R26.81);Muscle weakness (generalized) (M62.81);Pain Pain - Right/Left:  (B  LEs) Pain - part of body: Leg   Activity Tolerance Patient limited by pain   Patient Left in bed;with call bell/phone within reach;with bed alarm set   Nurse Communication Mobility status        Time: [id number] OT Time Calculation (min): 27 min  Charges: OT General Charges $OT Visit: 1 Visit OT Treatments $Self Care/Home Management : 23-37 mins  Darleen Crocker, MS, OTR/L , CBIS ascom [phone]  12/08/21, 2:10 PM

## 2021-12-09 ENCOUNTER — Encounter: Payer: Self-pay | Admitting: Student in an Organized Health Care Education/Training Program

## 2021-12-09 LAB — CBC
HCT: 36.9 % — ABNORMAL LOW (ref 39.0–52.0)
Hemoglobin: 12.8 g/dL — ABNORMAL LOW (ref 13.0–17.0)
MCH: 30.7 pg (ref 26.0–34.0)
MCHC: 34.7 g/dL (ref 30.0–36.0)
MCV: 88.5 fL (ref 80.0–100.0)
Platelets: 302 10*3/uL (ref 150–400)
RBC: 4.17 MIL/uL — ABNORMAL LOW (ref 4.22–5.81)
RDW: 13.2 % (ref 11.5–15.5)
WBC: 13.7 10*3/uL — ABNORMAL HIGH (ref 4.0–10.5)
nRBC: 0 % (ref 0.0–0.2)

## 2021-12-09 LAB — RENAL FUNCTION PANEL
Albumin: 2.8 g/dL — ABNORMAL LOW (ref 3.5–5.0)
Anion gap: 9 (ref 5–15)
BUN: 82 mg/dL — ABNORMAL HIGH (ref 6–20)
CO2: 20 mmol/L — ABNORMAL LOW (ref 22–32)
Calcium: 8.2 mg/dL — ABNORMAL LOW (ref 8.9–10.3)
Chloride: 106 mmol/L (ref 98–111)
Creatinine, Ser: 8.07 mg/dL — ABNORMAL HIGH (ref 0.61–1.24)
GFR, Estimated: 8 mL/min — ABNORMAL LOW (ref >60)
Glucose, Bld: 98 mg/dL (ref 70–99)
Phosphorus: 7.1 mg/dL — ABNORMAL HIGH (ref 2.5–4.6)
Potassium: 5.1 mmol/L (ref 3.5–5.1)
Sodium: 135 mmol/L (ref 135–145)

## 2021-12-09 LAB — HEPATITIS B CORE ANTIBODY, IGM: Hep B C IgM: NONREACTIVE

## 2021-12-09 LAB — CK: Total CK: 934 U/L — ABNORMAL HIGH (ref 49–397)

## 2021-12-09 LAB — HEPATITIS B SURFACE ANTIGEN: Hepatitis B Surface Ag: NONREACTIVE

## 2021-12-09 SURGERY — TEMPORARY DIALYSIS CATHETER
Anesthesia: Moderate Sedation

## 2021-12-09 MED ORDER — ONDANSETRON HCL 4 MG/2ML IJ SOLN
INTRAMUSCULAR | Status: AC
  Filled 2021-12-09: qty 2

## 2021-12-09 MED ORDER — CHLORHEXIDINE GLUCONATE CLOTH 2 % EX PADS: 6.0000 | MEDICATED_PAD | Freq: Every day | CUTANEOUS | Status: DC

## 2021-12-09 NOTE — Plan of Care (Signed)
  Problem: Clinical Measurements: Goal: Ability to maintain clinical measurements within normal limits will improve Outcome: Progressing   Problem: Activity: Goal: Risk for activity intolerance will decrease Outcome: Progressing   Problem: Pain Managment: Goal: General experience of comfort will improve Outcome: Progressing   Problem: Safety: Goal: Ability to remain free from injury will improve Outcome: Progressing   

## 2021-12-09 NOTE — Progress Notes (Signed)
PT Cancellation Note  Patient Details Name: Edwin [name] MRN: [id number] DOB: 21-Feb-1986   Cancelled Treatment:     Pt sitting EOB upon arrival, upset about having to start dialysis. Pt declined any activity and stated he was moving around fine. Pt has been very self limiting with activity. Continue PT per POC.  Jannet Askew 12/09/2021, 11:52 AM

## 2021-12-09 NOTE — Progress Notes (Signed)
OT Cancellation Note  Patient Details Name: Edwin [name] MRN: [id number] DOB: September 01, 1986   Cancelled Treatment:    Reason Eval/Treat Not Completed: Other (comment). Pt having labs drawn. Will re-attempt OT tx at later time as pt is available and medically appropriate.   Arman Filter., MPH, MS, OTR/L ascom [phone] 12/09/21, 1:49 PM

## 2021-12-09 NOTE — Progress Notes (Addendum)
Pt c/o nausea. Paged Bishop Limbo NP, given 4mg  zofran IV once order. Zofran pulled and given. System in downtime. Signed physical order form provided by , NP at [phone] due to system being down.

## 2021-12-09 NOTE — Progress Notes (Signed)
Patient expresses need to sigh out AMA for 2nd opinion at [facility].  Provider notified and has spoke with patient about the need to stay in hospital and the risks associated with leaving.  He and his father, who is at bedside, would like to leave.  Resource information given on detox rehabs in area and they were advised to go straight to ER at [facility].  All questions answered to his satisfaction.  Escorted to lobby in wheelchair, father at side.

## 2021-12-09 NOTE — Progress Notes (Signed)
PROGRESS NOTE    Edwin [name]  [id number] DOB: November 24, 1985 DOA: 12/01/2021 PCP: Pcp, No   Assessment & Plan:   Principal Problem:   Acute metabolic encephalopathy Active Problems:   Non-ST elevation (NSTEMI) myocardial infarction (HCC)   Cocaine abuse ([place])   Cocaine poisoning ([place])   Rhabdomyolysis   ATN (acute tubular necrosis) (HCC)   Hyperkalemia   Myositis   Acute hepatitis   Acute renal failure (HCC)   Elevated LFTs   Hypermagnesemia   Infection   Chronic hepatitis C without hepatic coma ([place])  Left thigh myositis/ left hip & knee joint effusions: completed abx course on 12/08/21 as per ID. S/p joint aspiration, not consistent w/ septic joint. Joint fluid cx NGTD   B/l hearing loss: acute & improving. Likely drug induced. Hx of IV drug use. Drug screen was positive for cocaine, benzos  Acute metabolic encephalopathy: likely secondary to cocaine druge use & likely uremia   AKI: Cr is still significantly elevated. Temp vasc cath will be placed by vascular surg for HD as per nephro. Nephro following and recs apprec   Rhabdomyolysis: CK is still elevated but trending down. Continue on IVFs  Hyperkalemia: WNL today   Hyponatremia: resolved   Elevated troponin: likely secondary to demand ischemia as per cardio. Drug screen was positive for cocaine. Cardio recs apprec  HCV: will need outpatient f/u for treatment     DVT prophylaxis: heparin  Code Status: full  Family Communication:  Disposition Plan: likely d/c back home   Status is: Inpatient Remains inpatient appropriate because: Cr is trending up daily and needs HD   Level of care: Med-Surg  Consultants:  Urology Gen surg  ID   Procedures:   Antimicrobials:    Subjective: Pt is very lethargic   Objective: Vitals:   12/08/21 1734 12/08/21 2045 12/09/21 0003 12/09/21 0537  BP: (!) 147/82 (!) 165/83 (!) 154/81 (!) 151/79  Pulse: 71 72 73 76  Resp: 17 18 20 20   Temp: 98.4 F (36.9 C) 97.8  F (36.6 C) 98.2 F (36.8 C) 98.9 F (37.2 C)  TempSrc: Oral Oral Oral Oral  SpO2: 100% 100% 100% 98%  Weight:      Height:        Intake/Output Summary (Last 24 hours) at 12/09/2021 0749 Last data filed at 12/09/2021 0600 Gross per 24 hour  Intake 2728.25 ml  Output 2075 ml  Net 653.25 ml   Filed Weights   12/02/21 0500 12/03/21 0138 12/03/21 2052  Weight: 85.9 kg 90 kg 97.8 kg    Examination:  General exam: Appears calm but lethargic  Respiratory system: clear breath sounds b/l  Cardiovascular system: S1/S2+. No rubs or clicks.  Gastrointestinal system: Abd is soft, NT, ND & hypoactive bowel sounds. Central nervous system: lethargic. Moves all extremities  Psychiatry: Judgement and insight appears not at baseline. Flat mood and affect    Data Reviewed: I have personally reviewed following labs and imaging studies  CBC: Recent Labs  Lab 12/02/21 0855 12/03/21 0455 12/06/21 0616 12/09/21 0317  WBC 18.3* 13.1* 10.9* 13.7*  NEUTROABS  --   --  6.8  --   HGB 15.0 12.4* 13.5 12.8*  HCT 43.9 34.8* 38.2* 36.9*  MCV 90.3 89.5 89.0 88.5  PLT 237 205 241 [id number]   Basic Metabolic Panel: Recent Labs  Lab 12/03/21 0455 12/04/21 0558 12/05/21 0406 12/06/21 0616 12/07/21 0318 12/08/21 0312 12/09/21 0317  NA 132*   < > 135 135 136 137 135  K 4.5   < > 4.7 4.8 5.1 5.8* 5.1  CL 105   < > 109 108 109 110 106  CO2 17*   < > 17* 18* 19* 19* 20*  GLUCOSE 110*   < > 95 102* 111* 99 98  BUN 76*   < > 77* 80* 86* 85* 82*  CREATININE 6.10*   < > 7.70* 8.23* 8.00* 8.28* 8.07*  CALCIUM 6.9*   < > 7.5* 7.9* 8.1* 8.4* 8.2*  MG 2.0  --  2.0  --   --   --   --   PHOS 6.3*  --  6.6* 6.7* 7.0* 6.7* 7.1*   < > = values in this interval not displayed.   GFR: Estimated Creatinine Clearance: 14.4 mL/min (A) (by C-G formula based on SCr of 8.07 mg/dL (H)). Liver Function Tests: Recent Labs  Lab 12/02/21 0855 12/03/21 0455 12/04/21 0558 12/05/21 0406 12/06/21 0616 12/07/21 0318  12/08/21 0312 12/09/21 0317  AST 711* 456* 292* 185* 119*  --   --   --   ALT 634* 648* 553* 369* 267*  --   --   --   ALKPHOS 80 63 61 47 48  --   --   --   BILITOT 0.6 0.5 0.7 0.5 0.5  --   --   --   PROT 5.7* 5.3* 5.6* 5.2* 5.7*  --   --   --   ALBUMIN 2.5* 2.2* 2.2* 2.1* 2.4* 2.4* 2.5* 2.8*   No results for input(s): LIPASE, AMYLASE in the last 168 hours. No results for input(s): AMMONIA in the last 168 hours. Coagulation Profile: No results for input(s): INR, PROTIME in the last 168 hours.  Cardiac Enzymes: Recent Labs  Lab 12/03/21 0455 12/04/21 0558 12/06/21 0616 12/06/21 2149 12/09/21 0317  CKTOTAL 11,555* 5,948* 3,426* 2,599* 934*   BNP (last 3 results) No results for input(s): PROBNP in the last 8760 hours. HbA1C: No results for input(s): HGBA1C in the last 72 hours. CBG: No results for input(s): GLUCAP in the last 168 hours.  Lipid Profile: No results for input(s): CHOL, HDL, LDLCALC, TRIG, CHOLHDL, LDLDIRECT in the last 72 hours. Thyroid Function Tests: No results for input(s): TSH, T4TOTAL, FREET4, T3FREE, THYROIDAB in the last 72 hours. Anemia Panel: No results for input(s): VITAMINB12, FOLATE, FERRITIN, TIBC, IRON, RETICCTPCT in the last 72 hours. Sepsis Labs: Recent Labs  Lab 12/02/21 0855 12/03/21 0455 12/06/21 2149 12/07/21 0104  PROCALCITON 9.21 6.85  --   --   LATICACIDVEN  --   --  0.9 1.0    Recent Results (from the past 240 hour(s))  Resp Panel by RT-PCR (Flu A&B, Covid) Nasopharyngeal Swab     Status: None   Collection Time: 12/01/21  8:16 AM   Specimen: Nasopharyngeal Swab; Nasopharyngeal(NP) swabs in vial transport medium  Result Value Ref Range Status   SARS Coronavirus 2 by RT PCR NEGATIVE NEGATIVE Final    Comment: (NOTE) SARS-CoV-2 target nucleic acids are NOT DETECTED.  The SARS-CoV-2 RNA is generally detectable in upper respiratory specimens during the acute phase of infection. The lowest concentration of SARS-CoV-2 viral  copies this assay can detect is 138 copies/mL. A negative result does not preclude SARS-Cov-2 infection and should not be used as the sole basis for treatment or other patient management decisions. A negative result may occur with  improper specimen collection/handling, submission of specimen other than nasopharyngeal swab, presence of viral mutation(s) within the areas targeted by this assay, and  inadequate number of viral copies(<138 copies/mL). A negative result must be combined with clinical observations, patient history, and epidemiological information. The expected result is Negative.  Fact Sheet for Patients:  EntrepreneurPulse.com.au  Fact Sheet for Healthcare Providers:  IncredibleEmployment.be  This test is no t yet approved or cleared by the [place] FDA and  has been authorized for detection and/or diagnosis of SARS-CoV-2 by FDA under an Emergency Use Authorization (EUA). This EUA will remain  in effect (meaning this test can be used) for the duration of the COVID-19 declaration under Section 564(b)(1) of the Act, 21 U.S.C.section 360bbb-3(b)(1), unless the authorization is terminated  or revoked sooner.       Influenza A by PCR NEGATIVE NEGATIVE Final   Influenza B by PCR NEGATIVE NEGATIVE Final    Comment: (NOTE) The Xpert Xpress SARS-CoV-2/FLU/RSV plus assay is intended as an aid in the diagnosis of influenza from Nasopharyngeal swab specimens and should not be used as a sole basis for treatment. Nasal washings and aspirates are unacceptable for Xpert Xpress SARS-CoV-2/FLU/RSV testing.  Fact Sheet for Patients: EntrepreneurPulse.com.au  Fact Sheet for Healthcare Providers: IncredibleEmployment.be  This test is not yet approved or cleared by the [place] FDA and has been authorized for detection and/or diagnosis of SARS-CoV-2 by FDA under an Emergency Use Authorization (EUA). This  EUA will remain in effect (meaning this test can be used) for the duration of the COVID-19 declaration under Section 564(b)(1) of the Act, 21 U.S.C. section 360bbb-3(b)(1), unless the authorization is terminated or revoked.  Performed at [facility], [address] [place], [place] [address]   MRSA Next Gen by PCR, Nasal     Status: None   Collection Time: 12/01/21 10:00 AM   Specimen: Urine, Catheterized; Nasal Swab  Result Value Ref Range Status   MRSA by PCR Next Gen NOT DETECTED NOT DETECTED Final    Comment: (NOTE) The GeneXpert MRSA Assay (FDA approved for NASAL specimens only), is one component of a comprehensive MRSA colonization surveillance program. It is not intended to diagnose MRSA infection nor to guide or monitor treatment for MRSA infections. Test performance is not FDA approved in patients less than 78 years old. Performed at [facility], [address], [place], [place] [address]   Urine Culture     Status: None   Collection Time: 12/01/21 10:10 AM   Specimen: Urine, Random  Result Value Ref Range Status   Specimen Description   Final    URINE, RANDOM Performed at [facility], [address], [place], [place] [address]    Special Requests   Final    NONE Performed at [facility], [address], [place], [place] [address]    Culture   Final    NO GROWTH Performed at [facility], [place] [address] [place], [place] [address]    Report Status 12/03/2021 FINAL  Final  CULTURE, BLOOD (ROUTINE X 2) w Reflex to ID Panel     Status: None   Collection Time: 12/01/21 10:13 AM   Specimen: BLOOD RIGHT HAND  Result Value Ref Range Status   Specimen Description BLOOD RIGHT HAND  Final   Special Requests   Final    BOTTLES DRAWN AEROBIC AND ANAEROBIC Blood Culture adequate volume   Culture   Final    NO GROWTH 5 DAYS Performed at [facility], [address] [place], [place] [address]    Report Status  12/06/2021 FINAL  Final  CULTURE,  BLOOD (ROUTINE X 2) w Reflex to ID Panel     Status: None   Collection Time: 12/01/21  2:32 PM   Specimen: BLOOD  Result Value Ref Range Status   Specimen Description BLOOD [facility]  Final   Special Requests BOTTLES DRAWN AEROBIC AND ANAEROBIC BCAV  Final   Culture   Final    NO GROWTH 5 DAYS Performed at [facility], [address], [place], [place] [address]    Report Status 12/06/2021 FINAL  Final  Body fluid culture w Gram Stain     Status: None   Collection Time: 12/02/21 11:25 AM   Specimen: PATH Cytology Misc. fluid; Synovial Fluid  Result Value Ref Range Status   Specimen Description   Final    HIP LEFT JOINT Performed at [facility], [address] [place], [place] [address]    Special Requests   Final    NONE Performed at [facility], [place], [place] [address]    Gram Stain   Final    FEW WBC PRESENT, PREDOMINANTLY MONONUCLEAR NO ORGANISMS SEEN    Culture   Final    NO GROWTH 3 DAYS Performed at [place] [address] [place], [place] [address]    Report Status 12/06/2021 FINAL  Final  Body fluid culture w Gram Stain     Status: None   Collection Time: 12/02/21  4:25 PM   Specimen: Synovium; Body Fluid  Result Value Ref Range Status   Specimen Description   Final    SYNOVIAL Performed at [facility], [address] [place], [place] [address]    Special Requests   Final    KNEE Performed at [facility], [address] [place], [place] [address]    Gram Stain   Final    RARE WBC PRESENT, PREDOMINANTLY MONONUCLEAR NO ORGANISMS SEEN    Culture   Final    NO GROWTH 3 DAYS Performed at [facility], [place] [address] [place], [place] [address]    Report Status 12/06/2021 FINAL  Final         Radiology Studies: No results found.      Scheduled Meds:  heparin injection (subcutaneous)  5,000 Units Subcutaneous Q8H   isosorbide  mononitrate  60 mg Oral Daily   pantoprazole  40 mg Oral Daily   sodium bicarbonate  650 mg Oral TID   Continuous Infusions:  sodium chloride Stopped (12/05/21 1134)   sodium bicarbonate 150 mEq in D5W infusion 75 mL/hr at 12/08/21 1343     LOS: 8 days    Time spent: 20 mins     Wyvonnia Dusky, MD [facility] Pager [phone]  If 7PM-7AM, please contact night-coverage 12/09/2021, 7:49 AM

## 2021-12-09 NOTE — Progress Notes (Signed)
[facility]  PROGRESS NOTE   Subjective:   Patient resting quietly, more alert but drowsy Completed breakfast tray at bedside Continues to complain of lower leg/ankle pain.  States no improvement since admission Reports mild diarrhea and nausea  Creatinine 8.07 Urine output 2L in past 24 hours IVF sodium bicarb@ 75 ml/hr  Objective:  Vital signs in last 24 hours:  Temp:  [97.8 F (36.6 C)-98.9 F (37.2 C)] 98.8 F (37.1 C) (02/09 0749) Pulse Rate:  [71-80] 80 (02/09 0749) Resp:  [17-20] 20 (02/09 0749) BP: (147-165)/(79-90) 151/90 (02/09 0749) SpO2:  [98 %-100 %] 98 % (02/09 0749)  Weight change:  Filed Weights   12/02/21 0500 12/03/21 0138 12/03/21 2052  Weight: 85.9 kg 90 kg 97.8 kg    Intake/Output: I/O last 3 completed shifts: In: 3490.4 [I.V.:1639.9; IV Piggyback:1850.5] Out: [id number] [Urine:3775]   Intake/Output this shift:  Total I/O In: 480 [P.O.:480] Out: 450 [Urine:450]  Physical Exam: General:  No acute distress  Head:  Normocephalic, atraumatic. Moist oral mucosal membranes  Eyes:  Anicteric  Lungs:   Clear to auscultation, normal effort  Heart:  S1S2 no rubs  Abdomen:   Soft, nontender, bowel sounds present  Extremities:  No peripheral edema.  Neurologic:  Awake, alert, following commands  Skin:  No lesions       Basic Metabolic Panel: Recent Labs  Lab 12/03/21 0455 12/04/21 0558 12/05/21 0406 12/06/21 0616 12/07/21 0318 12/08/21 0312 12/09/21 0317  NA 132*   < > 135 135 136 137 135  K 4.5   < > 4.7 4.8 5.1 5.8* 5.1  CL 105   < > 109 108 109 110 106  CO2 17*   < > 17* 18* 19* 19* 20*  GLUCOSE 110*   < > 95 102* 111* 99 98  BUN 76*   < > 77* 80* 86* 85* 82*  CREATININE 6.10*   < > 7.70* 8.23* 8.00* 8.28* 8.07*  CALCIUM 6.9*   < > 7.5* 7.9* 8.1* 8.4* 8.2*  MG 2.0  --  2.0  --   --   --   --   PHOS 6.3*  --  6.6* 6.7* 7.0* 6.7* 7.1*   < > = values in this interval not displayed.     CBC: Recent Labs  Lab  12/03/21 0455 12/06/21 0616 12/09/21 0317  WBC 13.1* 10.9* 13.7*  NEUTROABS  --  6.8  --   HGB 12.4* 13.5 12.8*  HCT 34.8* 38.2* 36.9*  MCV 89.5 89.0 88.5  PLT 205 241 302      Urinalysis: No results for input(s): COLORURINE, LABSPEC, PHURINE, GLUCOSEU, HGBUR, BILIRUBINUR, KETONESUR, PROTEINUR, UROBILINOGEN, NITRITE, LEUKOCYTESUR in the last 72 hours.  Invalid input(s): APPERANCEUR    Imaging: No results found.   Medications:    sodium chloride Stopped (12/05/21 1134)   sodium bicarbonate 150 mEq in D5W infusion 75 mL/hr at 12/08/21 1343    Chlorhexidine Gluconate Cloth  6 each Topical Q0600   heparin injection (subcutaneous)  5,000 Units Subcutaneous Q8H   isosorbide mononitrate  60 mg Oral Daily   pantoprazole  40 mg Oral Daily   sodium bicarbonate  650 mg Oral TID    Assessment/ Plan:     Principal Problem:   Acute metabolic encephalopathy Active Problems:   Non-ST elevation (NSTEMI) myocardial infarction (HCC)   Cocaine abuse (HCC)   Cocaine poisoning ([place])   Rhabdomyolysis   ATN (acute tubular necrosis) (HCC)   Hyperkalemia   Myositis  Acute hepatitis   Acute renal failure (HCC)   Elevated LFTs   Hypermagnesemia   Infection   Chronic hepatitis C without hepatic coma ([place])  36 year old male with history of drug abuse admitted for mental status changes.  He was found to have non-ST elevated MI, severe rhabdomyolysis and acute kidney injury.  #1: Acute kidney injury: Likely due to severe rhabdomyolysis.  Renal recovery appears to have stalled, but adequate urine output recorded.  Due to elevated BUN and lack of consistent mentation we still dialysis will be a viable option at this time.  Consulted vascular for placement of temp cath.  We will provide a couple dialysis treatments and monitor progress to determine continued treatment.  Goal of this treatment is to determine lack of mentation based on uremia versus other underlying cause. Continue IV fluids  at this time with monitoring of intake and output  #2: Rhabdomyolysis/severe myositis: Continued improvement in CK level  #3: Metabolic encephalopathy: Secondary to drug overdose and drug abuse.  More alert but drowsy  #4.  Hyperkalemia. Potassium 5.1.    LOS: [facility] 2/9/202312:02 PM

## 2021-12-10 ENCOUNTER — Encounter: Payer: Self-pay | Admitting: Emergency Medicine

## 2021-12-10 LAB — HEPATITIS B SURFACE ANTIBODY, QUANTITATIVE: Hep B S AB Quant (Post): 118.5 m[IU]/mL (ref >9.9)

## 2021-12-10 NOTE — Discharge Summary (Signed)
Physician Discharge Summary  Edwin [name] [id number] DOB: [birth year] DOA: 12/01/2021  PCP: Pcp, No  Admit date: 12/01/2021 Discharge date: 12/10/2021  Admitted From: home  Disposition:  Pt left AMA  Recommendations for Outpatient Follow-up:  Pt left AMA  Home Health: no  Equipment/Devices:  Discharge Condition: guarded  CODE STATUS: full  Diet recommendation: renal diet   Brief/Interim Summary: HPI was taken from NP Keene: 36 year old male with past medical history significant for polysubstance abuse admitted for acute metabolic encephalopathy in the setting of multiple severe metabolic derangements (AKI with Hyperkalemia, Rhabdomyolysis, liver failure) plus cocaine use.  Also found to have elevated troponin.  Nephrology and Cardiology following.   History of Present Illness:  Edwin [name] is a 36 year old male with no significant past medical history other than drug abuse, who presented to [facility] ED on 12/01/2021 due to altered mental status.  Patient is currently altered and no family is available, therefore history is obtained from chart review.   Per notes, EMS was dispatched as the patient woke up agitated and pulling out handfuls of his hair and thrashing around.  Upon EMS arrival he was disabled, foul-smelling having recently urinated on himself, and agitated.     Upon arrival to the ED he is able to focus on people in the room and give thumbs up, but does not answer questions or follow commands.  The only thing he would say is let me leave here.  He was picking off EKG leads and attempting to remove his IV.   ED Course: Initial vital signs: Respiratory rate 12, pulse 91, blood pressure 127/110, SPO2 96% on room air Significant labs: Sodium 133, potassium 6.6, chloride 96, bicarbonate 22, BUN 46, creatinine 3.28, glucose 210, alkaline phosphatase 105, AST 1510, ALT [id number], Tylenol 19, salicylates less than 7, ethyl alcohol less than 10, WBC 21.4 with neutrophilia COVID-19  PCR is pending Urinalysis pending UDS + cocaine Imaging: CT head: IMPRESSION: 1. No acute intracranial pathology. 2. Age-indeterminate fracture of the anterior left maxillary sinus wall. Chest x-ray: Cardiomediastinal contours and hilar structures with suggestion of cardiac enlargement and with central pulmonary vascular engorgement. No lobar consolidation. No sign of pleural effusion. No visible pneumothorax. Right upper quadrant ultrasound: IMPRESSION: Normal right upper quadrant ultrasound. Medications given: 1 amp sodium bicarbonate, albuterol, calcium gluconate, 2 L normal saline, Lokelma, 1 mg Versed   ED provider Dr. Karma Greaser spoke with Dr. Zollie Scale of nephrology, agreed with temporizing measures as above.   PCCM is asked admit the patient to ICU for further work-up and treatment of acute metabolic encephalopathy in the setting of multiple severe metabolic derangements and suspected drug abuse.  As per Dr. Ouida Sills: Edwin [name] is a 36 y.o. male with a PMH significant for polysubstance abuse. They presented from home to the ED on 12/01/2021 with AMS.  In the ED, it was found that they had acute metabolic encephalopathy in the setting of multiple severe metabolic derangements (AKI with Hyperkalemia, Rhabdomyolysis, liver failure) plus cocaine use.  Also found to have elevated troponin. Originally admitted to [facility]. Nephrology, ortho surgery, general surgery and Cardiology following.   12/07/21 -stable, slight improvement Cr   As per Dr. Jimmye Norman 2/8-12/09/21: Pt's Cr continue to rise and remain around 8.0. Nephro recommended that pt start HD. Vascular surg was consulted to put in a temporary HD cath. Pt stated he wanted a second opinion at [facility] so pt signed out AMA.    Discharge Diagnoses:  Principal Problem:   Acute metabolic encephalopathy Active  Problems:   Non-ST elevation (NSTEMI) myocardial infarction (HCC)   Cocaine abuse (HCC)   Cocaine poisoning ([place])   Rhabdomyolysis    ATN (acute tubular necrosis) (HCC)   Hyperkalemia   Myositis   Acute hepatitis   Acute renal failure (HCC)   Elevated LFTs   Hypermagnesemia   Infection   Chronic hepatitis C without hepatic coma (HCC)  Left thigh myositis/ left hip & knee joint effusions: completed abx course on 12/08/21 as per ID. S/p joint aspiration, not consistent w/ septic joint. Joint fluid cx NGTD    B/l hearing loss: acute & improving. Likely drug induced. Hx of IV drug use. Drug screen was positive for cocaine, benzos   Acute metabolic encephalopathy: likely secondary to cocaine druge use & likely uremia    AKI: Cr is still significantly elevated. Temp vasc cath will be placed by vascular surg for HD as per nephro. Nephro following and recs apprec    Rhabdomyolysis: CK is still elevated but trending down. Continue on IVFs   Hyperkalemia: WNL today    Hyponatremia: resolved    Elevated troponin: likely secondary to demand ischemia as per cardio. Drug screen was positive for cocaine. Cardio recs apprec   HCV: will need outpatient f/u for treatment    Discharge Instructions   Allergies as of 12/09/2021       Reactions   Mucomyst [acetylcysteine]    Vicodin [hydrocodone-acetaminophen] Other (See Comments)   Reaction:  GI upset         Medication List    You have not been prescribed any medications.     Allergies  Allergen Reactions   Mucomyst [Acetylcysteine]    Vicodin [Hydrocodone-Acetaminophen] Other (See Comments)    Reaction:  GI upset     Consultations: Nephro Vascular surg   Procedures/Studies: CT HEAD WO CONTRAST (5MM)  Result Date: 12/01/2021 CLINICAL DATA:  Altered mental status EXAM: CT HEAD WITHOUT CONTRAST TECHNIQUE: Contiguous axial images were obtained from the base of the skull through the vertex without intravenous contrast. RADIATION DOSE REDUCTION: This exam was performed according to the departmental dose-optimization program which includes automated exposure  control, adjustment of the mA and/or kV according to patient size and/or use of iterative reconstruction technique. COMPARISON:  CT head 12/07/2018 FINDINGS: Brain: There is no evidence of acute intracranial hemorrhage, extra-axial fluid collection, or acute infarct. Parenchymal volume is normal. The ventricles are normal in size. Gray-white differentiation is preserved. There is no mass lesion.  There is no midline shift. Vascular: No hyperdense vessel or unexpected calcification. Skull: No calvarial fracture is seen. There is an age-indeterminate fracture of the anterior left maxillary sinus wall. Sinuses/Orbits: There is moderate mucosal thickening in the bilateral maxillary sinuses. Globes and orbits are unremarkable. Other: None. IMPRESSION: 1. No acute intracranial pathology. 2. Age-indeterminate fracture of the anterior left maxillary sinus wall. Electronically Signed   By: Valetta Mole M.D.   On: 12/01/2021 08:11   CT FEMUR LEFT WO CONTRAST  Addendum Date: 12/01/2021   ADDENDUM REPORT: 12/01/2021 19:43 ADDENDUM: Discussed with PA Ouma over the telephone at 7:42 p.m. on 12/01/2021 with read back. Electronically Signed   By: Ofilia Neas M.D.   On: 12/01/2021 19:43   Result Date: 12/01/2021 CLINICAL DATA:  Cellulitis, swelling EXAM: CT OF THE LOWER LEFT EXTREMITY WITHOUT CONTRAST TECHNIQUE: Multidetector CT imaging of the lower left extremity was performed according to the standard protocol. RADIATION DOSE REDUCTION: This exam was performed according to the departmental dose-optimization program which includes automated  exposure control, adjustment of the mA and/or kV according to patient size and/or use of iterative reconstruction technique. COMPARISON:  None. FINDINGS: Bones/Joint/Cartilage No acute fracture identified. No suspicious bony lesions. No bone destruction or periosteal reaction. Appears to be a small knee joint effusion and hip joint effusion. Ligaments Suboptimally assessed by CT.  Muscles and Tendons Diffuse marked muscular edema and swelling visualized throughout the entire quadriceps muscle more prominent laterally, with accompanying perimuscular and intramuscular septal/fascial fluid/edema which is more prominent proximally. There is a smaller approximally 3.2 x 2.4 cm focal mild hypodense area identified in the mid biceps femoris muscle which may also represent edema/inflammation. Similar muscular edema and adjacent fat stranding of the muscles surrounding the left hip. No foci of air identified within the muscle or fascial planes. Soft tissues Mild subcutaneous fat stranding edema in the lateral aspect of the thigh. IMPRESSION: 1. Findings suggestive of extensive myositis involving the muscles about the left hip and most severely the quadriceps muscles, with accompanying fasciitis which is more prominent proximally. Limited evaluation without contrast. No foci of muscular or fascial gas identified, however necrotizing fasciitis can not be excluded as gas is not always seen. Clinical correlation recommended. 2. Smaller more focal hypodensity in the mid biceps femoris muscle which may also represent a smaller area of edema/inflammation. 3. Small hip joint and knee joint effusions. Septic joints can not be excluded. Will contact and notify the ordering provider. Electronically Signed: By: Ofilia Neas M.D. On: 12/01/2021 19:30   MR FEMUR LEFT W WO CONTRAST  Result Date: 12/02/2021 CLINICAL DATA:  Soft tissue infection suspected. History of polysubstance abuse. Acute metabolic encephalopathy. EXAM: MR OF THE LEFT LOWER EXTREMITY WITHOUT AND WITH CONTRAST TECHNIQUE: Multiplanar, multisequence MR imaging of the left thigh was performed both before and after administration of intravenous contrast. Exam was somewhat limited due to patient's ability to stay still due to encephalopathy. CONTRAST:  46mL GADAVIST GADOBUTROL 1 MMOL/ML IV SOLN COMPARISON:  None. FINDINGS: Bones/Joint/Cartilage  No evidence of acute fracture or dislocation. Evaluation is however somewhat limited due to significant motion and T2 weighted sequences. Ligaments No significant abnormality Muscles and Tendons There is muscle edema of the lateral thigh muscles involving the rectus lateralis. On post contrast sequence there is decreased perfusion with peripheral enhancement (series 25, image 57-62). There is a small area of decreased perfusion with peripheral enhancement in the biceps femoris muscle on axial post contrast sequence (series 25 images 56-64) concerning for developing abscess. There is also similar focus of decreased perfusion with peripheral enhancement in the lateral aspect of the gluteus maximus (images 14-18) Soft tissues Marked subcutaneous soft tissue edema about lateral aspect of the thigh without evidence of drainable fluid collection or abscess. IMPRESSION: 1. Well-defined area of decreased perfusion with peripheral enhancement in the gluteus maximus, rectus lateralis as well as in the biceps femoris muscle concerning for myositis/developing abscess. Clinical correlation and short-term follow-up with CT with contrast or sonogram would be helpful. 2. Marked subcutaneous soft tissue edema about the lateral aspect of the thigh. Electronically Signed   By: Keane Police D.O.   On: 12/02/2021 19:38   US Venous Img Lower Bilateral (DVT)  Result Date: 12/02/2021 CLINICAL DATA:  Bilateral lower extremity pain and edema. 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 Evaluate for acute or chronic DVT. EXAM: BILATERAL LOWER EXTREMITY VENOUS DOPPLER ULTRASOUND TECHNIQUE: Gray-scale sonography with graded compression, as well as color Doppler and duplex ultrasound were performed to evaluate the lower extremity deep venous  systems from the level of the common femoral vein and including the common femoral, femoral, profunda femoral, popliteal and calf veins including the posterior tibial, peroneal and  gastrocnemius veins when visible. The superficial great saphenous vein was also interrogated. Spectral Doppler was utilized to evaluate flow at rest and with distal augmentation maneuvers in the common femoral, femoral and popliteal veins. COMPARISON:  Bilateral lower extremity venous upper ultrasound 02/26/2014 (positive for DVT involving the right popliteal and tibial veins as well as the left peroneal vein) FINDINGS: RIGHT LOWER EXTREMITY Common Femoral Vein: No evidence of thrombus. Normal compressibility, respiratory phasicity and response to augmentation. Saphenofemoral Junction: No evidence of thrombus. Normal compressibility and flow on color Doppler imaging. Profunda Femoral Vein: No evidence of thrombus. Normal compressibility and flow on color Doppler imaging. Femoral Vein: No evidence of thrombus. Normal compressibility, respiratory phasicity and response to augmentation. Popliteal Vein: No evidence of acute or chronic thrombus. Normal compressibility, respiratory phasicity and response to augmentation. Calf Veins: No evidence of acute or chronic thrombus. Normal compressibility and flow on color Doppler imaging. Superficial Great Saphenous Vein: No evidence of thrombus. Normal compressibility. Other Findings:  None. _________________________________________________________ LEFT LOWER EXTREMITY Common Femoral Vein: No evidence of thrombus. Normal compressibility, respiratory phasicity and response to augmentation. Saphenofemoral Junction: No evidence of thrombus. Normal compressibility and flow on color Doppler imaging. Profunda Femoral Vein: No evidence of thrombus. Normal compressibility and flow on color Doppler imaging. Femoral Vein: No evidence of thrombus. Normal compressibility, respiratory phasicity and response to augmentation. Popliteal Vein: No evidence of thrombus. Normal compressibility, respiratory phasicity and response to augmentation. Calf Veins: No evidence of acute or chronic DVT with  special attention paid to the left peroneal vein normal compressibility and flow on color Doppler imaging. Superficial Great Saphenous Vein: No evidence of thrombus. Normal compressibility. Other Findings:  None. IMPRESSION: No evidence of acute or chronic DVT within either lower extremity with special attention paid to the right popliteal and tibial veins as well as the left peroneal vein. Electronically Signed   By: Sandi Mariscal M.D.   On: 12/02/2021 10:46   DG Chest Port 1 View  Result Date: 12/01/2021 CLINICAL DATA:  A 36 year old male presents for evaluation of altered mental status. EXAM: PORTABLE CHEST 1 VIEW COMPARISON:  Comparison made with December of 2021. FINDINGS: EKG leads project over the upper chest. Trachea midline. Cardiomediastinal contours and hilar structures with suggestion of cardiac enlargement and with central pulmonary vascular engorgement. No lobar consolidation. No sign of pleural effusion. No visible pneumothorax. On limited assessment there is no acute skeletal process. IMPRESSION: Cardiomegaly with central pulmonary vascular engorgement. No consolidation or effusion. Electronically Signed   By: Zetta Bills M.D.   On: 12/01/2021 08:39   DG Shoulder Left  Result Date: 12/02/2021 CLINICAL DATA:  Left shoulder pain. EXAM: LEFT SHOULDER - 2+ VIEW COMPARISON:  Left shoulder x-rays dated June 12, 2011. FINDINGS: No acute fracture or dislocation. Joint spaces are preserved. Bone mineralization is normal. Nonspecific soft tissue swelling around the left shoulder. IMPRESSION: 1. Nonspecific left shoulder soft tissue swelling. No acute osseous abnormality. Electronically Signed   By: Titus Dubin M.D.   On: 12/02/2021 09:19   DG FLUORO GUIDED NEEDLE PLC ASPIRATION/INJECTION LOC  Result Date: 12/02/2021 CLINICAL DATA:  Myositis of the left thigh. Suspicion for left hip infection. EXAM: LEFT HIP INJECTION UNDER FLUOROSCOPY COMPARISON:  None. FLUOROSCOPY: Radiation Exposure Index (as  provided by the fluoroscopic device): 0.1 mGy mGy Kerma PROCEDURE: After informed consent was  obtained explaining the risks, benefits, and possible complications of the procedure, the patient was placed supine on the fluoroscopy table. A formal time procedure was performed according to department of protocol. The left hip was prepped and draped in the usual sterile fashion. 1% lidocaine was used to anesthetize the skin. A 20 gauge spinal needle was advanced into the joint space under fluoroscopic guidance. 3 mL of straw-colored synovial fluid was aspirated from the left hip joint space. The fluid was sent to cytology for evaluation. Hemostasis was achieved. Patient tolerated the procedure well. There were no immediate complications. IMPRESSION: 1. Successful left hip aspiration yielding 3 mL of straw-colored synovial fluid. Technically successful  hip injection under fluoroscopy. Electronically Signed   By: Kathreen Devoid M.D.   On: 12/02/2021 11:13   ECHOCARDIOGRAM COMPLETE  Result Date: 12/01/2021    ECHOCARDIOGRAM REPORT   Patient Name:   LUAL Dellarocco Date of Exam: 12/01/2021 Medical Rec #:  [id number]      Height:       72.0 in Accession #:    [id number]     Weight:       179.0 lb Date of Birth:  1985-12-10     BSA:          2.033 m Patient Age:    85 years       BP:           141/94 mmHg Patient Gender: M              HR:           86 bpm. Exam Location:  [facility] Procedure: 2D Echo, Cardiac Doppler and Color Doppler Indications:     Elevated Troponin  History:         Patient has no prior history of Echocardiogram examinations. No                  past medical history on file.  Sonographer:     Sherrie Sport Referring Phys:  [id number] Bradly Bienenstock Diagnosing Phys: Kate Sable MD IMPRESSIONS  1. Left ventricular ejection fraction, by estimation, is 55 to 60%. The left ventricle has normal function. The left ventricle has no regional wall motion abnormalities. Left ventricular diastolic parameters were normal.   2. Right ventricular systolic function is low normal. The right ventricular size is normal.  3. The mitral valve is normal in structure. No evidence of mitral valve regurgitation.  4. The aortic valve is tricuspid. Aortic valve regurgitation is not visualized. FINDINGS  Left Ventricle: Left ventricular ejection fraction, by estimation, is 55 to 60%. The left ventricle has normal function. The left ventricle has no regional wall motion abnormalities. The left ventricular internal cavity size was normal in size. There is  no left ventricular hypertrophy. Left ventricular diastolic parameters were normal. Right Ventricle: The right ventricular size is normal. No increase in right ventricular wall thickness. Right ventricular systolic function is low normal. Left Atrium: Left atrial size was normal in size. Right Atrium: Right atrial size was normal in size. Pericardium: There is no evidence of pericardial effusion. Mitral Valve: The mitral valve is normal in structure. No evidence of mitral valve regurgitation. MV peak gradient, 3.2 mmHg. The mean mitral valve gradient is 1.0 mmHg. Tricuspid Valve: The tricuspid valve is normal in structure. Tricuspid valve regurgitation is not demonstrated. Aortic Valve: The aortic valve is tricuspid. Aortic valve regurgitation is not visualized. Aortic valve mean gradient measures 2.0 mmHg. Aortic valve peak gradient measures 3.7  mmHg. Aortic valve area, by VTI measures 2.77 cm. Pulmonic Valve: The pulmonic valve was normal in structure. Pulmonic valve regurgitation is not visualized. Aorta: The aortic root is normal in size and structure. Venous: The inferior vena cava was not well visualized. IAS/Shunts: No atrial level shunt detected by color flow Doppler.  LEFT VENTRICLE PLAX 2D LVIDd:         4.50 cm   Diastology LVIDs:         2.70 cm   LV e' medial:    13.10 cm/s LV PW:         1.00 cm   LV E/e' medial:  6.2 LV IVS:        1.10 cm   LV e' lateral:   14.10 cm/s LVOT diam:      2.00 cm   LV E/e' lateral: 5.7 LV SV:         42 LV SV Index:   21 LVOT Area:     3.14 cm  RIGHT VENTRICLE RV Basal diam:  4.10 cm RV S prime:     10.70 cm/s TAPSE (M-mode): 2.0 cm LEFT ATRIUM             Index        RIGHT ATRIUM           Index LA diam:        3.60 cm 1.77 cm/m   RA Area:     20.90 cm LA Vol (A2C):   54.9 ml 26.99 ml/m  RA Volume:   69.80 ml  34.34 ml/m LA Vol (A4C):   24.8 ml 12.20 ml/m LA Biplane Vol: 44.0 ml 21.65 ml/m  AORTIC VALVE                    PULMONIC VALVE AV Area (Vmax):    2.62 cm     PV Vmax:        0.76 m/s AV Area (Vmean):   2.64 cm     PV Vmean:       44.700 cm/s AV Area (VTI):     2.77 cm     PV VTI:         0.106 m AV Vmax:           96.15 cm/s   PV Peak grad:   2.3 mmHg AV Vmean:          69.200 cm/s  PV Mean grad:   1.0 mmHg AV VTI:            0.152 m      RVOT Peak grad: 3 mmHg AV Peak Grad:      3.7 mmHg AV Mean Grad:      2.0 mmHg LVOT Vmax:         80.30 cm/s LVOT Vmean:        58.200 cm/s LVOT VTI:          0.134 m LVOT/AV VTI ratio: 0.88  AORTA Ao Root diam: 3.45 cm MITRAL VALVE               TRICUSPID VALVE MV Area (PHT): 4.96 cm    TR Peak grad:   11.8 mmHg MV Area VTI:   2.43 cm    TR Vmax:        172.00 cm/s MV Peak grad:  3.2 mmHg MV Mean grad:  1.0 mmHg    SHUNTS MV Vmax:       0.89 m/s    Systemic  VTI:  0.13 m MV Vmean:      54.8 cm/s   Systemic Diam: 2.00 cm MV Decel Time: 153 msec    Pulmonic VTI:  0.135 m MV E velocity: 81.00 cm/s MV A velocity: 51.40 cm/s MV E/A ratio:  1.58 Kate Sable MD Electronically signed by Kate Sable MD Signature Date/Time: 12/01/2021/3:14:50 PM    Final    CT EXTREMITY LOWER LEFT WO CONTRAST  Result Date: 12/06/2021 CLINICAL DATA:  Left lower leg swelling. Renal insufficiency. Not on dialysis. Polysubstance abuse. Changes of myositis and fasciitis on recent CT and MRI examinations. EXAM: CT OF THE LOWER LEFT EXTREMITY WITHOUT CONTRAST TECHNIQUE: Multidetector CT imaging of the lower left extremity was  performed according to the standard protocol. RADIATION DOSE REDUCTION: This exam was performed according to the departmental dose-optimization program which includes automated exposure control, adjustment of the mA and/or kV according to patient size and/or use of iterative reconstruction technique. COMPARISON:  MRI of the left lower extremity dated 12/02/2021. CT of the left lower extremity dated 12/01/2021. FINDINGS: Bones/Joint/Cartilage No bone destruction or periosteal reaction. Small to moderate-sized knee effusion with progression. Ligaments Suboptimally assessed by CT. Muscles and Tendons The previously demonstrated 3.2 x 2.4 cm area of low density in the left biceps femoris muscle currently measures 3.0 x 2.2 cm on image number 29/5. There is interval better defined low density in the left vastus lateralis muscle, measuring 9.3 x 8.1 cm on image number 24/5. Interval better defined low density in the lateral aspect of the gluteus maximus muscle, measuring 4.9 x 3.0 cm on image number 144/5. Interval low density in the posterior aspect of the medial rectus muscle measuring 4.6 x 1.7 cm on image number 91/5. Crescentic low density in the lateral aspect of the gluteus medius muscle with adjacent subfascial fluid. The subfascial fluid has increased and is more confluent without evidence of abscess. Soft tissues Extensive subcutaneous edema at the level of the pelvis and throughout the proximal and extending laterally in the lower leg. No areas of muscle low density in the lower leg. Fluid density between muscles in the superior aspect of the left thigh mild overall progression. No soft tissue gas. IMPRESSION: 1. Mildly progressive changes of myositis and fasciitis involving the upper left leg and buttock regions with increased soft tissue edema. 2. No discrete fluid collections suspicious for abscess visualized. 3. No soft tissue gas or bony changes of osteomyelitis. 4. Progressive knee effusion.  Electronically Signed   By: Claudie Revering M.D.   On: 12/06/2021 20:45   US Abdomen Limited RUQ (LIVER/GB)  Result Date: 12/01/2021 CLINICAL DATA:  Elevated LFTs EXAM: ULTRASOUND ABDOMEN LIMITED RIGHT UPPER QUADRANT COMPARISON:  None. FINDINGS: Gallbladder: No gallstones or wall thickening visualized. No sonographic Murphy sign noted by sonographer. Common bile duct: Diameter: 2 mm Liver: No focal lesion identified. Within normal limits in parenchymal echogenicity. Portal vein is patent on color Doppler imaging with normal direction of blood flow towards the liver. Other: None. IMPRESSION: Normal right upper quadrant ultrasound. Electronically Signed   By: Maurine Simmering M.D.   On: 12/01/2021 09:14   (Echo, Carotid, EGD, Colonoscopy, ERCP)    Subjective: Pt is very lethargic.    Discharge Exam: Vitals:   12/09/21 0537 12/09/21 0749  BP: (!) 151/79 (!) 151/90  Pulse: 76 80  Resp: 20 20  Temp: 98.9 F (37.2 C) 98.8 F (37.1 C)  SpO2: 98% 98%   Vitals:   12/08/21 2045 12/09/21 0003 12/09/21 0537 12/09/21  0749  BP: (!) 165/83 (!) 154/81 (!) 151/79 (!) 151/90  Pulse: 72 73 76 80  Resp: 18 20 20 20   Temp: 97.8 F (36.6 C) 98.2 F (36.8 C) 98.9 F (37.2 C) 98.8 F (37.1 C)  TempSrc: Oral Oral Oral Oral  SpO2: 100% 100% 98% 98%  Weight:      Height:       Physical exam from progressive note on 12/09/21: General exam: Appears calm but lethargic  Respiratory system: clear breath sounds b/l  Cardiovascular system: S1/S2+. No rubs or clicks.  Gastrointestinal system: Abd is soft, NT, ND & hypoactive bowel sounds. Central nervous system: lethargic. Moves all extremities  Psychiatry: Judgement and insight appears not at baseline. Flat mood and affect    The results of significant diagnostics from this hospitalization (including imaging, microbiology, ancillary and laboratory) are listed below for reference.     Microbiology: Recent Results (from the past 240 hour(s))  Resp Panel by  RT-PCR (Flu A&B, Covid) Nasopharyngeal Swab     Status: None   Collection Time: 12/01/21  8:16 AM   Specimen: Nasopharyngeal Swab; Nasopharyngeal(NP) swabs in vial transport medium  Result Value Ref Range Status   SARS Coronavirus 2 by RT PCR NEGATIVE NEGATIVE Final    Comment: (NOTE) SARS-CoV-2 target nucleic acids are NOT DETECTED.  The SARS-CoV-2 RNA is generally detectable in upper respiratory specimens during the acute phase of infection. The lowest concentration of SARS-CoV-2 viral copies this assay can detect is 138 copies/mL. A negative result does not preclude SARS-Cov-2 infection and should not be used as the sole basis for treatment or other patient management decisions. A negative result may occur with  improper specimen collection/handling, submission of specimen other than nasopharyngeal swab, presence of viral mutation(s) within the areas targeted by this assay, and inadequate number of viral copies(<138 copies/mL). A negative result must be combined with clinical observations, patient history, and epidemiological information. The expected result is Negative.  Fact Sheet for Patients:  EntrepreneurPulse.com.au  Fact Sheet for Healthcare Providers:  IncredibleEmployment.be  This test is no t yet approved or cleared by the [place] FDA and  has been authorized for detection and/or diagnosis of SARS-CoV-2 by FDA under an Emergency Use Authorization (EUA). This EUA will remain  in effect (meaning this test can be used) for the duration of the COVID-19 declaration under Section 564(b)(1) of the Act, 21 U.S.C.section 360bbb-3(b)(1), unless the authorization is terminated  or revoked sooner.       Influenza A by PCR NEGATIVE NEGATIVE Final   Influenza B by PCR NEGATIVE NEGATIVE Final    Comment: (NOTE) The Xpert Xpress SARS-CoV-2/FLU/RSV plus assay is intended as an aid in the diagnosis of influenza from Nasopharyngeal swab  specimens and should not be used as a sole basis for treatment. Nasal washings and aspirates are unacceptable for Xpert Xpress SARS-CoV-2/FLU/RSV testing.  Fact Sheet for Patients: EntrepreneurPulse.com.au  Fact Sheet for Healthcare Providers: IncredibleEmployment.be  This test is not yet approved or cleared by the [place] FDA and has been authorized for detection and/or diagnosis of SARS-CoV-2 by FDA under an Emergency Use Authorization (EUA). This EUA will remain in effect (meaning this test can be used) for the duration of the COVID-19 declaration under Section 564(b)(1) of the Act, 21 U.S.C. section 360bbb-3(b)(1), unless the authorization is terminated or revoked.  Performed at [facility], [address], [place], [place] [address]   MRSA Next Gen by PCR, Nasal     Status: None  Collection Time: 12/01/21 10:00 AM   Specimen: Urine, Catheterized; Nasal Swab  Result Value Ref Range Status   MRSA by PCR Next Gen NOT DETECTED NOT DETECTED Final    Comment: (NOTE) The GeneXpert MRSA Assay (FDA approved for NASAL specimens only), is one component of a comprehensive MRSA colonization surveillance program. It is not intended to diagnose MRSA infection nor to guide or monitor treatment for MRSA infections. Test performance is not FDA approved in patients less than 75 years old. Performed at [facility], [address] [place], [place] [address]   Urine Culture     Status: None   Collection Time: 12/01/21 10:10 AM   Specimen: Urine, Random  Result Value Ref Range Status   Specimen Description   Final    URINE, RANDOM Performed at [facility], [address], [place], [place] [address]    Special Requests   Final    NONE Performed at [facility], [address], [place], [place] [address]    Culture   Final    NO GROWTH Performed at [facility], [place] [address], [place],  [place] [address]    Report Status 12/03/2021 FINAL  Final  CULTURE, BLOOD (ROUTINE X 2) w Reflex to ID Panel     Status: None   Collection Time: 12/01/21 10:13 AM   Specimen: BLOOD RIGHT HAND  Result Value Ref Range Status   Specimen Description BLOOD RIGHT HAND  Final   Special Requests   Final    BOTTLES DRAWN AEROBIC AND ANAEROBIC Blood Culture adequate volume   Culture   Final    NO GROWTH 5 DAYS Performed at [facility], [address] [place], [place] [address]    Report Status 12/06/2021 FINAL  Final  CULTURE, BLOOD (ROUTINE X 2) w Reflex to ID Panel     Status: None   Collection Time: 12/01/21  2:32 PM   Specimen: BLOOD  Result Value Ref Range Status   Specimen Description BLOOD [facility]  Final   Special Requests BOTTLES DRAWN AEROBIC AND ANAEROBIC BCAV  Final   Culture   Final    NO GROWTH 5 DAYS Performed at [facility], [address] [place], [place] [address]    Report Status 12/06/2021 FINAL  Final  Body fluid culture w Gram Stain     Status: None   Collection Time: 12/02/21 11:25 AM   Specimen: PATH Cytology Misc. fluid; Synovial Fluid  Result Value Ref Range Status   Specimen Description   Final    HIP LEFT JOINT Performed at [facility], [address] [place], [place] [address]    Special Requests   Final    NONE Performed at [facility], [place], [place] [address]    Gram Stain   Final    FEW WBC PRESENT, PREDOMINANTLY MONONUCLEAR NO ORGANISMS SEEN    Culture   Final    NO GROWTH 3 DAYS Performed at [facility] [address], [place], [place] [address]    Report Status 12/06/2021 FINAL  Final  Body fluid culture w Gram Stain     Status: None   Collection Time: 12/02/21  4:25 PM   Specimen: Synovium; Body Fluid  Result Value Ref Range Status   Specimen Description   Final    SYNOVIAL Performed at [facility], [address], [place],  [address]    Special Requests    Final  KNEE Performed at [facility], [address] [place], [place] [address]    Gram Stain   Final    RARE WBC PRESENT, PREDOMINANTLY MONONUCLEAR NO ORGANISMS SEEN    Culture   Final    NO GROWTH 3 DAYS Performed at [facility], [place] [address] [place], [place] [address]    Report Status 12/06/2021 FINAL  Final     Labs: BNP (last 3 results) No results for input(s): BNP in the last 8760 hours. Basic Metabolic Panel: Recent Labs  Lab 12/05/21 0406 12/06/21 0616 12/07/21 0318 12/08/21 0312 12/09/21 0317  NA 135 135 136 137 135  K 4.7 4.8 5.1 5.8* 5.1  CL 109 108 109 110 106  CO2 17* 18* 19* 19* 20*  GLUCOSE 95 102* 111* 99 98  BUN 77* 80* 86* 85* 82*  CREATININE 7.70* 8.23* 8.00* 8.28* 8.07*  CALCIUM 7.5* 7.9* 8.1* 8.4* 8.2*  MG 2.0  --   --   --   --   PHOS 6.6* 6.7* 7.0* 6.7* 7.1*   Liver Function Tests: Recent Labs  Lab 12/04/21 0558 12/05/21 0406 12/06/21 0616 12/07/21 0318 12/08/21 0312 12/09/21 0317  AST 292* 185* 119*  --   --   --   ALT 553* 369* 267*  --   --   --   ALKPHOS 61 47 48  --   --   --   BILITOT 0.7 0.5 0.5  --   --   --   PROT 5.6* 5.2* 5.7*  --   --   --   ALBUMIN 2.2* 2.1* 2.4* 2.4* 2.5* 2.8*   No results for input(s): LIPASE, AMYLASE in the last 168 hours. No results for input(s): AMMONIA in the last 168 hours. CBC: Recent Labs  Lab 12/06/21 0616 12/09/21 0317  WBC 10.9* 13.7*  NEUTROABS 6.8  --   HGB 13.5 12.8*  HCT 38.2* 36.9*  MCV 89.0 88.5  PLT 241 302   Cardiac Enzymes: Recent Labs  Lab 12/04/21 0558 12/06/21 0616 12/06/21 2149 12/09/21 0317  CKTOTAL 5,948* 3,426* 2,599* 934*   BNP: Invalid input(s): POCBNP CBG: No results for input(s): GLUCAP in the last 168 hours. D-Dimer No results for input(s): DDIMER in the last 72 hours. Hgb A1c No results for input(s): HGBA1C in the last 72 hours. Lipid Profile No results for input(s): CHOL, HDL, LDLCALC, TRIG, CHOLHDL, LDLDIRECT in the last  72 hours. Thyroid function studies No results for input(s): TSH, T4TOTAL, T3FREE, THYROIDAB in the last 72 hours.  Invalid input(s): FREET3 Anemia work up No results for input(s): VITAMINB12, FOLATE, FERRITIN, TIBC, IRON, RETICCTPCT in the last 72 hours. Urinalysis    Component Value Date/Time   COLORURINE BROWN (A) 12/01/2021 1000   APPEARANCEUR CLEAR (A) 12/01/2021 1000   APPEARANCEUR Clear 03/22/2014 1052   LABSPEC 1.016 12/01/2021 1000   LABSPEC 1.026 03/22/2014 1052   PHURINE  12/01/2021 1000    TEST NOT REPORTED DUE TO COLOR INTERFERENCE OF URINE PIGMENT   GLUCOSEU (A) 12/01/2021 1000    TEST NOT REPORTED DUE TO COLOR INTERFERENCE OF URINE PIGMENT   GLUCOSEU Negative 03/22/2014 1052   HGBUR (A) 12/01/2021 1000    TEST NOT REPORTED DUE TO COLOR INTERFERENCE OF URINE PIGMENT   BILIRUBINUR (A) 12/01/2021 1000    TEST NOT REPORTED DUE TO COLOR INTERFERENCE OF URINE PIGMENT   BILIRUBINUR Negative 03/22/2014 1052   KETONESUR (A) 12/01/2021 1000    TEST NOT REPORTED DUE TO COLOR INTERFERENCE  OF URINE PIGMENT   PROTEINUR (A) 12/01/2021 1000    TEST NOT REPORTED DUE TO COLOR INTERFERENCE OF URINE PIGMENT   UROBILINOGEN 1.0 02/28/2009 2307   NITRITE (A) 12/01/2021 1000    TEST NOT REPORTED DUE TO COLOR INTERFERENCE OF URINE PIGMENT   LEUKOCYTESUR (A) 12/01/2021 1000    TEST NOT REPORTED DUE TO COLOR INTERFERENCE OF URINE PIGMENT   LEUKOCYTESUR Negative 03/22/2014 1052   Sepsis Labs Invalid input(s): PROCALCITONIN,  WBC,  LACTICIDVEN Microbiology Recent Results (from the past 240 hour(s))  Resp Panel by RT-PCR (Flu A&B, Covid) Nasopharyngeal Swab     Status: None   Collection Time: 12/01/21  8:16 AM   Specimen: Nasopharyngeal Swab; Nasopharyngeal(NP) swabs in vial transport medium  Result Value Ref Range Status   SARS Coronavirus 2 by RT PCR NEGATIVE NEGATIVE Final    Comment: (NOTE) SARS-CoV-2 target nucleic acids are NOT DETECTED.  The SARS-CoV-2 RNA is generally  detectable in upper respiratory specimens during the acute phase of infection. The lowest concentration of SARS-CoV-2 viral copies this assay can detect is 138 copies/mL. A negative result does not preclude SARS-Cov-2 infection and should not be used as the sole basis for treatment or other patient management decisions. A negative result may occur with  improper specimen collection/handling, submission of specimen other than nasopharyngeal swab, presence of viral mutation(s) within the areas targeted by this assay, and inadequate number of viral copies(<138 copies/mL). A negative result must be combined with clinical observations, patient history, and epidemiological information. The expected result is Negative.  Fact Sheet for Patients:  BloggerCourse.comhttps://www.fda.gov/media/152166/download  Fact Sheet for Healthcare Providers:  SeriousBroker.ithttps://www.fda.gov/media/152162/download  This test is no t yet approved or cleared by the [place] FDA and  has been authorized for detection and/or diagnosis of SARS-CoV-2 by FDA under an Emergency Use Authorization (EUA). This EUA will remain  in effect (meaning this test can be used) for the duration of the COVID-19 declaration under Section 564(b)(1) of the Act, 21 U.S.C.section 360bbb-3(b)(1), unless the authorization is terminated  or revoked sooner.       Influenza A by PCR NEGATIVE NEGATIVE Final   Influenza B by PCR NEGATIVE NEGATIVE Final    Comment: (NOTE) The Xpert Xpress SARS-CoV-2/FLU/RSV plus assay is intended as an aid in the diagnosis of influenza from Nasopharyngeal swab specimens and should not be used as a sole basis for treatment. Nasal washings and aspirates are unacceptable for Xpert Xpress SARS-CoV-2/FLU/RSV testing.  Fact Sheet for Patients: BloggerCourse.comhttps://www.fda.gov/media/152166/download  Fact Sheet for Healthcare Providers: SeriousBroker.ithttps://www.fda.gov/media/152162/download  This test is not yet approved or cleared by the [place] FDA  and has been authorized for detection and/or diagnosis of SARS-CoV-2 by FDA under an Emergency Use Authorization (EUA). This EUA will remain in effect (meaning this test can be used) for the duration of the COVID-19 declaration under Section 564(b)(1) of the Act, 21 U.S.C. section 360bbb-3(b)(1), unless the authorization is terminated or revoked.  Performed at [facility], [address] [place], [place] [address]   MRSA Next Gen by PCR, Nasal     Status: None   Collection Time: 12/01/21 10:00 AM   Specimen: Urine, Catheterized; Nasal Swab  Result Value Ref Range Status   MRSA by PCR Next Gen NOT DETECTED NOT DETECTED Final    Comment: (NOTE) The GeneXpert MRSA Assay (FDA approved for NASAL specimens only), is one component of a comprehensive MRSA colonization surveillance program. It is not intended to diagnose MRSA infection nor to guide or monitor  treatment for MRSA infections. Test performance is not FDA approved in patients less than 65 years old. Performed at [facility], [address], [place], [place] [address]   Urine Culture     Status: None   Collection Time: 12/01/21 10:10 AM   Specimen: Urine, Random  Result Value Ref Range Status   Specimen Description   Final    URINE, RANDOM Performed at [facility], [address] [place], [place] [address]    Special Requests   Final    NONE Performed at [facility], [address], [place], [place] [address]    Culture   Final    NO GROWTH Performed at [facility], [place] [address] [place], [place] [address]    Report Status 12/03/2021 FINAL  Final  CULTURE, BLOOD (ROUTINE X 2) w Reflex to ID Panel     Status: None   Collection Time: 12/01/21 10:13 AM   Specimen: BLOOD RIGHT HAND  Result Value Ref Range Status   Specimen Description BLOOD RIGHT HAND  Final   Special Requests   Final    BOTTLES DRAWN AEROBIC AND ANAEROBIC Blood Culture adequate volume   Culture    Final    NO GROWTH 5 DAYS Performed at [facility], [address] [place], [place] [address]    Report Status 12/06/2021 FINAL  Final  CULTURE, BLOOD (ROUTINE X 2) w Reflex to ID Panel     Status: None   Collection Time: 12/01/21  2:32 PM   Specimen: BLOOD  Result Value Ref Range Status   Specimen Description BLOOD [facility]  Final   Special Requests BOTTLES DRAWN AEROBIC AND ANAEROBIC BCAV  Final   Culture   Final    NO GROWTH 5 DAYS Performed at [facility], [address] [place], [place] [address]    Report Status 12/06/2021 FINAL  Final  Body fluid culture w Gram Stain     Status: None   Collection Time: 12/02/21 11:25 AM   Specimen: PATH Cytology Misc. fluid; Synovial Fluid  Result Value Ref Range Status   Specimen Description   Final    HIP LEFT JOINT Performed at [facility], [address] [place], [place] [address]    Special Requests   Final    NONE Performed at [facility], [place], [place] [address]    Gram Stain   Final    FEW WBC PRESENT, PREDOMINANTLY MONONUCLEAR NO ORGANISMS SEEN    Culture   Final    NO GROWTH 3 DAYS Performed at [place] [address] [place], [place] [address]    Report Status 12/06/2021 FINAL  Final  Body fluid culture w Gram Stain     Status: None   Collection Time: 12/02/21  4:25 PM   Specimen: Synovium; Body Fluid  Result Value Ref Range Status   Specimen Description   Final    SYNOVIAL Performed at [facility], [address] [place], [place] [address]    Special Requests   Final    KNEE Performed at [facility], [address] [place], [place] [address]    Gram Stain   Final    RARE WBC PRESENT, PREDOMINANTLY MONONUCLEAR NO ORGANISMS SEEN    Culture   Final    NO GROWTH 3 DAYS Performed at [facility], [place] [address] [place], [place] [address]    Report Status 12/06/2021 FINAL  Final     Time coordinating  discharge: Over 30 minutes  SIGNED:   Wyvonnia Dusky, MD  [facility] 12/10/2021, 4:43 PM Pager   If 7PM-7AM, please contact night-coverage

## 2021-12-13 ENCOUNTER — Ambulatory Visit: Admit: 2021-12-13 | Payer: Managed Care, Other (non HMO) | Admitting: Vascular Surgery

## 2021-12-13 ENCOUNTER — Other Ambulatory Visit: Payer: Self-pay

## 2021-12-19 ENCOUNTER — Emergency Department: Payer: Self-pay

## 2021-12-19 ENCOUNTER — Other Ambulatory Visit: Payer: Self-pay

## 2021-12-19 ENCOUNTER — Emergency Department
Admission: EM | Admit: 2021-12-19 | Discharge: 2021-12-19 | Disposition: A | Discharge status: Home / Self Care | Payer: Self-pay | Attending: Emergency Medicine | Admitting: Emergency Medicine

## 2021-12-19 DIAGNOSIS — T50901A Poisoning by unspecified drugs, medicaments and biological substances, accidental (unintentional), initial encounter: Secondary | ICD-10-CM

## 2021-12-19 DIAGNOSIS — R7989 Other specified abnormal findings of blood chemistry: Secondary | ICD-10-CM | POA: Insufficient documentation

## 2021-12-19 DIAGNOSIS — N189 Chronic kidney disease, unspecified: Secondary | ICD-10-CM | POA: Insufficient documentation

## 2021-12-19 DIAGNOSIS — I10 Essential (primary) hypertension: Secondary | ICD-10-CM

## 2021-12-19 DIAGNOSIS — T405X1A Poisoning by cocaine, accidental (unintentional), initial encounter: Secondary | ICD-10-CM | POA: Insufficient documentation

## 2021-12-19 DIAGNOSIS — M79604 Pain in right leg: Secondary | ICD-10-CM | POA: Insufficient documentation

## 2021-12-19 DIAGNOSIS — I129 Hypertensive chronic kidney disease with stage 1 through stage 4 chronic kidney disease, or unspecified chronic kidney disease: Secondary | ICD-10-CM | POA: Insufficient documentation

## 2021-12-19 DIAGNOSIS — R000 Tachycardia, unspecified: Secondary | ICD-10-CM | POA: Insufficient documentation

## 2021-12-19 DIAGNOSIS — F1410 Cocaine abuse, uncomplicated: Secondary | ICD-10-CM | POA: Insufficient documentation

## 2021-12-19 DIAGNOSIS — T40711A Poisoning by cannabis, accidental (unintentional), initial encounter: Secondary | ICD-10-CM | POA: Insufficient documentation

## 2021-12-19 DIAGNOSIS — T4271XA Poisoning by unspecified antiepileptic and sedative-hypnotic drugs, accidental (unintentional), initial encounter: Secondary | ICD-10-CM | POA: Insufficient documentation

## 2021-12-19 DIAGNOSIS — M79605 Pain in left leg: Secondary | ICD-10-CM | POA: Insufficient documentation

## 2021-12-19 LAB — CBC WITH DIFFERENTIAL/PLATELET
Abs Immature Granulocytes: 0.06 10*3/uL (ref 0.00–0.07)
Basophils Absolute: 0.1 10*3/uL (ref 0.0–0.1)
Basophils Relative: 1 %
Eosinophils Absolute: 0.2 10*3/uL (ref 0.0–0.5)
Eosinophils Relative: 2 %
HCT: 40.7 % (ref 39.0–52.0)
Hemoglobin: 13.2 g/dL (ref 13.0–17.0)
Immature Granulocytes: 1 %
Lymphocytes Relative: 23 %
Lymphs Abs: 2.2 10*3/uL (ref 0.7–4.0)
MCH: 31 pg (ref 26.0–34.0)
MCHC: 32.4 g/dL (ref 30.0–36.0)
MCV: 95.5 fL (ref 80.0–100.0)
Monocytes Absolute: 1.2 10*3/uL — ABNORMAL HIGH (ref 0.1–1.0)
Monocytes Relative: 13 %
Neutro Abs: 5.7 10*3/uL (ref 1.7–7.7)
Neutrophils Relative %: 60 %
Platelets: 351 10*3/uL (ref 150–400)
RBC: 4.26 MIL/uL (ref 4.22–5.81)
RDW: 13.7 % (ref 11.5–15.5)
WBC: 9.5 10*3/uL (ref 4.0–10.5)
nRBC: 0 % (ref 0.0–0.2)

## 2021-12-19 LAB — URINE DRUG SCREEN, QUALITATIVE (ARMC ONLY)
Amphetamines, Ur Screen: NOT DETECTED
Barbiturates, Ur Screen: NOT DETECTED
Benzodiazepine, Ur Scrn: POSITIVE — AB
Cannabinoid 50 Ng, Ur ~~LOC~~: NOT DETECTED
Cocaine Metabolite,Ur ~~LOC~~: POSITIVE — AB
MDMA (Ecstasy)Ur Screen: NOT DETECTED
Methadone Scn, Ur: NOT DETECTED
Opiate, Ur Screen: NOT DETECTED
Phencyclidine (PCP) Ur S: NOT DETECTED
Tricyclic, Ur Screen: NOT DETECTED

## 2021-12-19 LAB — COMPREHENSIVE METABOLIC PANEL
ALT: 25 U/L (ref 0–44)
AST: 33 U/L (ref 15–41)
Albumin: 4.2 g/dL (ref 3.5–5.0)
Alkaline Phosphatase: 58 U/L (ref 38–126)
Anion gap: 12 (ref 5–15)
BUN: 36 mg/dL — ABNORMAL HIGH (ref 6–20)
CO2: 25 mmol/L (ref 22–32)
Calcium: 9.2 mg/dL (ref 8.9–10.3)
Chloride: 105 mmol/L (ref 98–111)
Creatinine, Ser: 2.12 mg/dL — ABNORMAL HIGH (ref 0.61–1.24)
GFR, Estimated: 41 mL/min — ABNORMAL LOW (ref >60)
Glucose, Bld: 100 mg/dL — ABNORMAL HIGH (ref 70–99)
Potassium: 4.5 mmol/L (ref 3.5–5.1)
Sodium: 142 mmol/L (ref 135–145)
Total Bilirubin: 0.4 mg/dL (ref 0.3–1.2)
Total Protein: 8.7 g/dL — ABNORMAL HIGH (ref 6.5–8.1)

## 2021-12-19 LAB — MAGNESIUM: Magnesium: 2.1 mg/dL (ref 1.7–2.4)

## 2021-12-19 LAB — AMMONIA: Ammonia: 31 umol/L (ref 9–35)

## 2021-12-19 LAB — CK: Total CK: 176 U/L (ref 49–397)

## 2021-12-19 MED ORDER — NALOXONE HCL 4 MG/0.1ML NA LIQD: NASAL | 1 refills | Status: DC

## 2021-12-19 MED ORDER — ACETAMINOPHEN 500 MG PO TABS: 1000.0000 mg | ORAL_TABLET | Freq: Once | ORAL | Status: DC

## 2021-12-19 MED ORDER — LORAZEPAM 2 MG/ML IJ SOLN
1.0000 mg | Freq: Once | INTRAMUSCULAR | Status: AC
  Administered 2021-12-19: 1 mg via INTRAVENOUS
  Filled 2021-12-19: qty 1

## 2021-12-19 MED ORDER — LACTATED RINGERS IV BOLUS
1000.0000 mL | Freq: Once | INTRAVENOUS | Status: AC
  Administered 2021-12-19: 1000 mL via INTRAVENOUS

## 2021-12-19 NOTE — ED Provider Notes (Signed)
[facility] Provider Note    Event Date/Time   First MD Initiated Contact with Patient 12/19/21 [phone]     (approximate)   History   Drug Overdose   HPI  Edwin [name] is a 36 y.o. male  male with past medical history significant for polysubstance abuse and recent admission 2/1 to [id number] for metabolic encephalopathy thought to be multifactorial in the setting of polysubstance use including cocaine as well as rhabdomyolysis liver failure and renal failure discharged AMA seeking a second opinion at [facility] and discharged on 2/16 who presents via EMS after being found unresponsive with agonal breathing by a friend.  EMS gave 0.8 of IM Narcan and 2 mg of intranasal Narcan.  Patient reported to EMS he had used cocaine marijuana and gabapentin.  On arrival emergency room patient states he does not remember exactly what happened.  He states he has been struggling with drugs since being discharged and left the hospital because of 2 many students and he "did not want to be a [place] pig".  He states he has some soreness in both legs and feels a little more on the left today which is more swollen but denies any injuries.  He is denying any chest pain, cough, fevers, headache, abdominal pain rash or any other extremity pain other than the legs.  On review of records from patient's hospitalization [facility] seems he also has a history of a DVT and hep C.      Physical Exam  Triage Vital Signs: ED Triage Vitals  Enc Vitals Group     BP      Pulse      Resp      Temp      Temp src      SpO2      Weight      Height      Head Circumference      Peak Flow      Pain Score      Pain Loc      Pain Edu?      Excl. in [place]?     Most recent vital signs: Vitals:   12/19/21 1000 12/19/21 1130  BP: (!) 163/95 137/72  Pulse: 96 95  Resp: (!) 9 17  Temp:    SpO2: 91% 100%    General: Awake,no distress.   CV:  Good peripheral perfusion.  2+ radial pulses.  No murmurs rubs or  gallops. Resp:  Normal effort.  Abd:  No distention.  Other:  Bilateral lower EXTR edema fully worse in the left without evidence of significant trauma.  2+ DP pulses.  Patient is able to move lower extremities symmetric strength.  Patient is not oriented and seems a little bit confused and tangential but is able to reliably follow commands in all extremities.  PERRLA.  EOMI.   ED Results / Procedures / Treatments  Labs (all labs ordered are listed, but only abnormal results are displayed) Labs Reviewed  CBC WITH DIFFERENTIAL/PLATELET - Abnormal; Notable for the following components:      Result Value   Monocytes Absolute 1.2 (*)    All other components within normal limits  COMPREHENSIVE METABOLIC PANEL - Abnormal; Notable for the following components:   Glucose, Bld 100 (*)    BUN 36 (*)    Creatinine, Ser 2.12 (*)    Total Protein 8.7 (*)    GFR, Estimated 41 (*)    All other components within normal limits  URINE  DRUG SCREEN, QUALITATIVE ([facility] ONLY) - Abnormal; Notable for the following components:   Cocaine Metabolite,Ur [place] POSITIVE (*)    Benzodiazepine, Ur Scrn POSITIVE (*)    All other components within normal limits  MAGNESIUM  CK  AMMONIA     EKG  ECG shows sinus tachycardia with a ventricular rate of 102 with a Q wave in lead III without other clear evidence of acute ischemia or significant arrhythmia.  Unremarkable intervals.  Normal axis.   RADIOLOGY CT head on my interpretation shows no evidence of skull injury, hemorrhage, edema, mass effect, ventriculomegaly other acute intracranial process.  I also reviewed radiologist dictation and agree with their findings. . Lower extremity ultrasound my assessment shows no evidence of DVT.  I also reviewed radiology's findings and agree with the interpretation of no evidence of DVT.  PROCEDURES:  Critical Care performed: No  .1-3 Lead EKG Interpretation Performed by: Gilles Chiquito, MD Authorized by: Gilles Chiquito, MD     Interpretation: normal     ECG rate assessment: normal     Rhythm: sinus rhythm     Ectopy: none     Conduction: normal    The patient is on the cardiac monitor to evaluate for evidence of arrhythmia and/or significant heart rate changes.   MEDICATIONS ORDERED IN ED: Medications  acetaminophen (TYLENOL) tablet 1,000 mg (1,000 mg Oral Patient Refused/Not Given 12/19/21 1139)  lactated ringers bolus 1,000 mL (0 mLs Intravenous Stopped 12/19/21 1140)  LORazepam (ATIVAN) injection 1 mg (1 mg Intravenous Given 12/19/21 0746)     IMPRESSION / MDM / ASSESSMENT AND PLAN / ED COURSE  I reviewed the triage vital signs and the nursing notes.                              Differential diagnosis includes, but is not limited to acute polysubstance overdose possibly complicated by metabolic arrangement such as acute on chronic kidney injury, rhabdomyolysis as well as concern for possible left lower extremity DVT given patient has a history of this and states he has had some worsening swelling in that leg compared to the right and does not seem to be on any blood thinners at this time.  In addition given patient she does not remember what happens and does seem a little confused ordered CT head to rule out possible acute cranial mass, hemorrhage or edema contributing to his very mild encephalopathy.  This could also be secondary to some residual effects of substance use in the last 24 hours.  ECG shows sinus tachycardia with a ventricular rate of 102 with a Q wave in lead III without other clear evidence of acute ischemia or significant arrhythmia.  Unremarkable intervals.  Normal axis.  CT head on my interpretation shows no evidence of skull injury, hemorrhage, edema, mass effect, ventriculomegaly other acute intracranial process.  I also reviewed radiologist dictation and agree with their findings.  Lower extremity ultrasound my assessment shows no evidence of DVT.  I also reviewed  radiology's findings and agree with the interpretation of no evidence of DVT.  Unclear etiology for the edema in the legs.  Possible this relates to lymphedema versus venous stasis.  Patient denies any injuries and does not have any areas of focal pain or puncture wounds or evidence of cellulitis or septic joint.  CMP is remarkable for BUN of 36 and a creatinine of 2.12 without any other significant electrolyte or metabolic arrangements.  This compares to a creatinine of 3.25 obtained on 2/16 and a BUN of 41 on that date.  This is an improvement in kidney function.  No other significant electrolyte or metabolic derangements on today's CMP.  CBC without leukocytosis or acute anemia.  Magnesium is unremarkable.  CK not suggestive of rhabdomyolysis or myositis.  Ammonia is unremarkable.  He has is positive for cocaine.  Patient was observed for over 4 hours, he was placed on oxygen for a few minutes this was rapidly down titrated to room air and did not require any additional Narcan.  On reassessment he is clinically sober.  He will have a friend bring him his walker.  He will follow-up with his PCP I also recommended [facility] for assistance with substance abuse.  He has no other acute concerns at this time.  Discharged in stable condition.      FINAL CLINICAL IMPRESSION(S) / ED DIAGNOSES   Final diagnoses:  Accidental overdose, initial encounter  Chronic kidney disease, unspecified CKD stage  Hypertension, unspecified type  Cocaine abuse ([place])     Rx / DC Orders   ED Discharge Orders          Ordered    naloxone (NARCAN) nasal spray 4 mg/0.1 mL        12/19/21 1143             Note:  This document was prepared using Dragon voice recognition software and may include unintentional dictation errors.   Lucrezia Starch, MD 12/19/21 1146

## 2021-12-19 NOTE — ED Notes (Signed)
Pt more awake, calling ride now.

## 2021-12-19 NOTE — ED Notes (Signed)
Tech attempted to ambulate pt, pt's falling asleep while tech talking to pt. MD notified.

## 2021-12-19 NOTE — ED Notes (Signed)
Per pt, his grandmother will pick him up. Pt was complaining that provider did not prescribed him strong pain medicine. States "[facility] gave me some weak Oxy. I was hurting last night that's why I 'bong' some Fentanyl. I will come back here because yall didn't give me anything for my leg. I don't know why my buddy called 911, I have Narcan at home, it takes 5 mins for someone to come back, he shouldn't have called EMS".

## 2021-12-19 NOTE — ED Notes (Signed)
This tech attempted to ambulate patient. Patient is difficult to arouse. Patient could not stay awake for more than a minute and kept going back to sleep. Will attempt to ambulate at a later time. RN made aware.

## 2021-12-19 NOTE — ED Triage Notes (Signed)
Pt BIB EMS from home for drug overdose, was unresponsive and with agonal breathing. Friend who lives with pt called EMS and gave Narcan 0.8 IM. Narcan 2mg  intranasal given by EMS. Pt reported using cocaine, marijuana, took gabapentin. Pt now ao x 3, doesn't remember what happened.   Sinus Tach 118 BP- ? 99% RA CBG 121

## 2021-12-19 NOTE — ED Notes (Signed)
This RN noticed on monitor patietn oxygen saturation was at 68% with a good pleth and waveform. RN to bedside. Pt hard to arouse but woke up. Placed patient on 2 liters of oxygen via [place]. Oxygen improved to 100%.

## 2021-12-19 NOTE — ED Notes (Signed)
[place] and water provided.

## 2022-02-01 ENCOUNTER — Inpatient Hospital Stay: Payer: Managed Care, Other (non HMO) | Admitting: Infectious Diseases

## 2022-11-12 ENCOUNTER — Emergency Department: Payer: Medicaid Other

## 2022-11-12 ENCOUNTER — Inpatient Hospital Stay
Admission: EM | Admit: 2022-11-12 | Discharge: 2022-11-15 | DRG: 907 | Disposition: A | Discharge status: Home / Self Care | Payer: Medicaid Other | Attending: Osteopathic Medicine | Admitting: Osteopathic Medicine

## 2022-11-12 ENCOUNTER — Inpatient Hospital Stay: Payer: Medicaid Other

## 2022-11-12 DIAGNOSIS — J690 Pneumonitis due to inhalation of food and vomit: Secondary | ICD-10-CM | POA: Diagnosis present

## 2022-11-12 DIAGNOSIS — Z86711 Personal history of pulmonary embolism: Secondary | ICD-10-CM

## 2022-11-12 DIAGNOSIS — E872 Acidosis, unspecified: Secondary | ICD-10-CM | POA: Diagnosis present

## 2022-11-12 DIAGNOSIS — F17210 Nicotine dependence, cigarettes, uncomplicated: Secondary | ICD-10-CM | POA: Diagnosis present

## 2022-11-12 DIAGNOSIS — F1110 Opioid abuse, uncomplicated: Secondary | ICD-10-CM | POA: Diagnosis present

## 2022-11-12 DIAGNOSIS — F1190 Opioid use, unspecified, uncomplicated: Secondary | ICD-10-CM | POA: Diagnosis not present

## 2022-11-12 DIAGNOSIS — J9601 Acute respiratory failure with hypoxia: Secondary | ICD-10-CM | POA: Diagnosis present

## 2022-11-12 DIAGNOSIS — Z86718 Personal history of other venous thrombosis and embolism: Secondary | ICD-10-CM | POA: Diagnosis not present

## 2022-11-12 DIAGNOSIS — I252 Old myocardial infarction: Secondary | ICD-10-CM

## 2022-11-12 DIAGNOSIS — T405X1A Poisoning by cocaine, accidental (unintentional), initial encounter: Principal | ICD-10-CM | POA: Diagnosis present

## 2022-11-12 DIAGNOSIS — T40604A Poisoning by unspecified narcotics, undetermined, initial encounter: Secondary | ICD-10-CM

## 2022-11-12 DIAGNOSIS — R4182 Altered mental status, unspecified: Secondary | ICD-10-CM | POA: Diagnosis present

## 2022-11-12 DIAGNOSIS — T50901A Poisoning by unspecified drugs, medicaments and biological substances, accidental (unintentional), initial encounter: Secondary | ICD-10-CM | POA: Diagnosis not present

## 2022-11-12 DIAGNOSIS — T43621A Poisoning by amphetamines, accidental (unintentional), initial encounter: Secondary | ICD-10-CM | POA: Diagnosis present

## 2022-11-12 LAB — COMPREHENSIVE METABOLIC PANEL
ALT: 62 U/L — ABNORMAL HIGH (ref 0–44)
AST: 68 U/L — ABNORMAL HIGH (ref 15–41)
Albumin: 3.9 g/dL (ref 3.5–5.0)
Alkaline Phosphatase: 62 U/L (ref 38–126)
Anion gap: 10 (ref 5–15)
BUN: 14 mg/dL (ref 6–20)
CO2: 26 mmol/L (ref 22–32)
Calcium: 8.9 mg/dL (ref 8.9–10.3)
Chloride: 101 mmol/L (ref 98–111)
Creatinine, Ser: 0.92 mg/dL (ref 0.61–1.24)
GFR, Estimated: >60 mL/min (ref >60)
Glucose, Bld: 141 mg/dL — ABNORMAL HIGH (ref 70–99)
Potassium: 3.8 mmol/L (ref 3.5–5.1)
Sodium: 137 mmol/L (ref 135–145)
Total Bilirubin: 0.5 mg/dL (ref 0.3–1.2)
Total Protein: 8.3 g/dL — ABNORMAL HIGH (ref 6.5–8.1)

## 2022-11-12 LAB — BLOOD GAS, ARTERIAL
Acid-Base Excess: 0.9 mmol/L (ref 0.0–2.0)
Bicarbonate: 28.3 mmol/L — ABNORMAL HIGH (ref 20.0–28.0)
FIO2: 100 %
MECHVT: 450 mL
Mechanical Rate: 18
O2 Saturation: 98.5 %
PEEP: 10 cmH2O
Patient temperature: 37
pCO2 arterial: 55 mmHg — ABNORMAL HIGH (ref 32–48)
pH, Arterial: 7.32 — ABNORMAL LOW (ref 7.35–7.45)
pO2, Arterial: 87 mmHg (ref 83–108)

## 2022-11-12 LAB — CBC WITH DIFFERENTIAL/PLATELET
Abs Immature Granulocytes: 0.13 10*3/uL — ABNORMAL HIGH (ref 0.00–0.07)
Basophils Absolute: 0.1 10*3/uL (ref 0.0–0.1)
Basophils Relative: 1 %
Eosinophils Absolute: 0.1 10*3/uL (ref 0.0–0.5)
Eosinophils Relative: 2 %
HCT: 45.7 % (ref 39.0–52.0)
Hemoglobin: 15.1 g/dL (ref 13.0–17.0)
Immature Granulocytes: 2 %
Lymphocytes Relative: 35 %
Lymphs Abs: 2.5 10*3/uL (ref 0.7–4.0)
MCH: 30.6 pg (ref 26.0–34.0)
MCHC: 33 g/dL (ref 30.0–36.0)
MCV: 92.7 fL (ref 80.0–100.0)
Monocytes Absolute: 0.5 10*3/uL (ref 0.1–1.0)
Monocytes Relative: 7 %
Neutro Abs: 3.8 10*3/uL (ref 1.7–7.7)
Neutrophils Relative %: 53 %
Platelets: 302 10*3/uL (ref 150–400)
RBC: 4.93 MIL/uL (ref 4.22–5.81)
RDW: 12.3 % (ref 11.5–15.5)
WBC: 7.1 10*3/uL (ref 4.0–10.5)
nRBC: 0 % (ref 0.0–0.2)

## 2022-11-12 LAB — URINE DRUG SCREEN, QUALITATIVE (ARMC ONLY)
Amphetamines, Ur Screen: POSITIVE — AB
Barbiturates, Ur Screen: NOT DETECTED
Benzodiazepine, Ur Scrn: NOT DETECTED
Cannabinoid 50 Ng, Ur ~~LOC~~: NOT DETECTED
Cocaine Metabolite,Ur ~~LOC~~: POSITIVE — AB
MDMA (Ecstasy)Ur Screen: NOT DETECTED
Methadone Scn, Ur: NOT DETECTED
Opiate, Ur Screen: NOT DETECTED
Phencyclidine (PCP) Ur S: NOT DETECTED
Tricyclic, Ur Screen: NOT DETECTED

## 2022-11-12 LAB — URINALYSIS, ROUTINE W REFLEX MICROSCOPIC
Bilirubin Urine: NEGATIVE
Glucose, UA: NEGATIVE mg/dL
Hgb urine dipstick: NEGATIVE
Ketones, ur: NEGATIVE mg/dL
Leukocytes,Ua: NEGATIVE
Nitrite: NEGATIVE
Protein, ur: NEGATIVE mg/dL
Specific Gravity, Urine: 1.011 (ref 1.005–1.030)
pH: 6 (ref 5.0–8.0)

## 2022-11-12 LAB — GLUCOSE, CAPILLARY
Glucose-Capillary: 138 mg/dL — ABNORMAL HIGH (ref 70–99)
Glucose-Capillary: 131 mg/dL — ABNORMAL HIGH (ref 70–99)
Glucose-Capillary: 131 mg/dL — ABNORMAL HIGH (ref 70–99)

## 2022-11-12 LAB — BRAIN NATRIURETIC PEPTIDE: B Natriuretic Peptide: 42 pg/mL (ref 0.0–100.0)

## 2022-11-12 LAB — PROCALCITONIN: Procalcitonin: <0.1 ng/mL

## 2022-11-12 LAB — MRSA NEXT GEN BY PCR, NASAL: MRSA by PCR Next Gen: NOT DETECTED

## 2022-11-12 LAB — TROPONIN I (HIGH SENSITIVITY): Troponin I (High Sensitivity): 8 ng/L (ref <18)

## 2022-11-12 LAB — CK: Total CK: 289 U/L (ref 49–397)

## 2022-11-12 LAB — MAGNESIUM: Magnesium: 2.1 mg/dL (ref 1.7–2.4)

## 2022-11-12 LAB — LACTIC ACID, PLASMA: Lactic Acid, Venous: 2.8 mmol/L (ref 0.5–1.9)

## 2022-11-12 LAB — CBG MONITORING, ED: Glucose-Capillary: 164 mg/dL — ABNORMAL HIGH (ref 70–99)

## 2022-11-12 MED ORDER — CHLORHEXIDINE GLUCONATE CLOTH 2 % EX PADS
6.0000 | MEDICATED_PAD | Freq: Every day | CUTANEOUS | Status: DC
  Administered 2022-11-12 – 2022-11-15 (×4): 6 via TOPICAL

## 2022-11-12 MED ORDER — VANCOMYCIN HCL 2000 MG/400ML IV SOLN
2000.0000 mg | Freq: Once | INTRAVENOUS | Status: AC
  Administered 2022-11-12: 2000 mg via INTRAVENOUS
  Filled 2022-11-12: qty 400

## 2022-11-12 MED ORDER — LACTATED RINGERS IV BOLUS
1000.0000 mL | Freq: Once | INTRAVENOUS | Status: AC
  Administered 2022-11-12: 1000 mL via INTRAVENOUS

## 2022-11-12 MED ORDER — SODIUM CHLORIDE 0.9 % IV SOLN
3.0000 g | Freq: Four times a day (QID) | INTRAVENOUS | Status: DC
  Administered 2022-11-12 – 2022-11-15 (×11): 3 g via INTRAVENOUS
  Filled 2022-11-12: qty 8
  Filled 2022-11-12 (×10): qty 3
  Filled 2022-11-12: qty 8
  Filled 2022-11-12: qty 3

## 2022-11-12 MED ORDER — ORAL CARE MOUTH RINSE: 15.0000 mL | OROMUCOSAL | Status: DC | PRN

## 2022-11-12 MED ORDER — ORAL CARE MOUTH RINSE
15.0000 mL | OROMUCOSAL | Status: DC
  Administered 2022-11-12 – 2022-11-14 (×24): 15 mL via OROMUCOSAL

## 2022-11-12 MED ORDER — DOCUSATE SODIUM 50 MG/5ML PO LIQD: 100.0000 mg | Freq: Two times a day (BID) | ORAL | Status: DC | PRN

## 2022-11-12 MED ORDER — FAMOTIDINE 20 MG PO TABS
20.0000 mg | ORAL_TABLET | Freq: Two times a day (BID) | ORAL | Status: DC
  Administered 2022-11-12 – 2022-11-13 (×3): 20 mg
  Filled 2022-11-12 (×4): qty 1

## 2022-11-12 MED ORDER — HEPARIN SODIUM (PORCINE) 5000 UNIT/ML IJ SOLN
5000.0000 [IU] | Freq: Three times a day (TID) | INTRAMUSCULAR | Status: DC
  Administered 2022-11-12 – 2022-11-15 (×8): 5000 [IU] via SUBCUTANEOUS
  Filled 2022-11-12 (×8): qty 1

## 2022-11-12 MED ORDER — ACETAMINOPHEN 325 MG PO TABS
650.0000 mg | ORAL_TABLET | Freq: Four times a day (QID) | ORAL | Status: DC | PRN
  Administered 2022-11-12 – 2022-11-14 (×3): 650 mg
  Filled 2022-11-12 (×3): qty 2

## 2022-11-12 MED ORDER — SODIUM CHLORIDE 0.9 % IV SOLN
2.0000 g | Freq: Once | INTRAVENOUS | Status: AC
  Administered 2022-11-12: 2 g via INTRAVENOUS
  Filled 2022-11-12: qty 12.5

## 2022-11-12 MED ORDER — ROCURONIUM BROMIDE 10 MG/ML (PF) SYRINGE
100.0000 mg | PREFILLED_SYRINGE | Freq: Once | INTRAVENOUS | Status: AC
  Administered 2022-11-12: 100 mg via INTRAVENOUS
  Filled 2022-11-12: qty 10

## 2022-11-12 MED ORDER — ENOXAPARIN SODIUM 60 MG/0.6ML IJ SOSY: 0.5000 mg/kg | PREFILLED_SYRINGE | INTRAMUSCULAR | Status: DC

## 2022-11-12 MED ORDER — ETOMIDATE 2 MG/ML IV SOLN
20.0000 mg | Freq: Once | INTRAVENOUS | Status: AC
  Administered 2022-11-12: 20 mg via INTRAVENOUS
  Filled 2022-11-12: qty 10

## 2022-11-12 MED ORDER — LORAZEPAM 2 MG/ML IJ SOLN
2.0000 mg | INTRAMUSCULAR | Status: AC
  Administered 2022-11-12: 2 mg via INTRAVENOUS
  Filled 2022-11-12: qty 1

## 2022-11-12 MED ORDER — PROPOFOL 1000 MG/100ML IV EMUL
5.0000 ug/kg/min | INTRAVENOUS | Status: DC
  Administered 2022-11-12: 5 ug/kg/min via INTRAVENOUS
  Administered 2022-11-12 (×2): 60 ug/kg/min via INTRAVENOUS
  Administered 2022-11-13: 30 ug/kg/min via INTRAVENOUS
  Administered 2022-11-13 (×3): 60 ug/kg/min via INTRAVENOUS
  Administered 2022-11-13: 30 ug/kg/min via INTRAVENOUS
  Administered 2022-11-14: 50 ug/kg/min via INTRAVENOUS
  Administered 2022-11-14: 25 ug/kg/min via INTRAVENOUS
  Filled 2022-11-12: qty 100
  Filled 2022-11-12 (×2): qty 200
  Filled 2022-11-12 (×6): qty 100
  Filled 2022-11-12: qty 200
  Filled 2022-11-12: qty 100

## 2022-11-12 MED ORDER — SODIUM CHLORIDE 0.9 % IV SOLN: 2.0000 g | Freq: Once | INTRAVENOUS | Status: DC

## 2022-11-12 MED ORDER — POLYETHYLENE GLYCOL 3350 17 G PO PACK: 17.0000 g | PACK | Freq: Every day | ORAL | Status: DC | PRN

## 2022-11-12 MED ORDER — IOHEXOL 350 MG/ML SOLN
75.0000 mL | Freq: Once | INTRAVENOUS | Status: AC | PRN
  Administered 2022-11-12: 75 mL via INTRAVENOUS

## 2022-11-12 MED ORDER — NOREPINEPHRINE 4 MG/250ML-% IV SOLN
INTRAVENOUS | Status: AC
  Administered 2022-11-12: 5 ug/min via INTRAVENOUS
  Filled 2022-11-12: qty 250

## 2022-11-12 MED ORDER — NOREPINEPHRINE 4 MG/250ML-% IV SOLN
0.0000 ug/min | INTRAVENOUS | Status: DC
  Administered 2022-11-12: 5 ug/min via INTRAVENOUS
  Filled 2022-11-12 (×2): qty 250

## 2022-11-12 MED ORDER — SODIUM CHLORIDE 0.9 % IV SOLN: INTRAVENOUS | Status: DC | PRN

## 2022-11-12 NOTE — Progress Notes (Signed)
Pt transported to CT and back to CCU, no complications

## 2022-11-12 NOTE — ED Provider Notes (Signed)
[facility] Provider Note    Event Date/Time   First MD Initiated Contact with Patient 11/12/22 [phone]     (approximate)   History   Drug Overdose   HPI  Edwin [name] is a 37 y.o. male who presents to the ED for evaluation of Drug Overdose   Patient presents to the ED by EMS from home after overdose.  He was reportedly found down in the bathroom, gray, mottled and with agonal respirations.  EMS reports that fire always had pulses and they do not think that he actually had cardiac arrest.  He received intranasal Narcan, 4 mg prehospital and arrives with EMS on a nonrebreather.  He is agitated, complaining of chest pain and difficulty breathing.  He appears unwell.  History is somewhat limited.  He does tell me that it was accidental and acknowledges that he overdosed.  Reports he has had a red spot atraumatically to the right lower leg for the past 3 to 4 days of uncertain etiology.  No recent antibiotics.  Denies cough or shortness of breath preceding this.  Physical Exam   Triage Vital Signs: ED Triage Vitals  Enc Vitals Group     BP 11/12/22 0551 (!) 157/123     Pulse Rate 11/12/22 0551 (!) 123     Resp 11/12/22 0551 (!) 40     Temp 11/12/22 0551 98.1 F (36.7 C)     Temp Source 11/12/22 0551 Axillary     SpO2 11/12/22 0551 (!) 88 %     Weight 11/12/22 0553 210 lb (95.3 kg)     Height 11/12/22 0553 5\' 10"  (1.778 m)     Head Circumference --      Peak Flow --      Pain Score --      Pain Loc --      Pain Edu? --      Excl. in [place]? --     Most recent vital signs: Vitals:   11/12/22 0600 11/12/22 0645  BP: (!) 140/111 (!) 181/106  Pulse: (!) 112 97  Resp: (!) 34 (!) 22  Temp:    SpO2: 92% 97%    General: Sitting upright on nonrebreather, looks unwell. CV:  Tachycardic and regular Resp:  Tachypneic to 40-50, coarse breath sounds in the right.  No wheezing. Abd:  No distention.  MSK:  Erythema and swelling to the right lower leg  just proximal to the ankle Track marks throughout Neuro:  No focal deficits appreciated. Other:     ED Results / Procedures / Treatments   Labs (all labs ordered are listed, but only abnormal results are displayed) Labs Reviewed  COMPREHENSIVE METABOLIC PANEL - Abnormal; Notable for the following components:      Result Value   Glucose, Bld 141 (*)    Total Protein 8.3 (*)    AST 68 (*)    ALT 62 (*)    All other components within normal limits  CBC WITH DIFFERENTIAL/PLATELET - Abnormal; Notable for the following components:   Abs Immature Granulocytes 0.13 (*)    All other components within normal limits  LACTIC ACID, PLASMA - Abnormal; Notable for the following components:   Lactic Acid, Venous 2.8 (*)    All other components within normal limits  CBG MONITORING, ED - Abnormal; Notable for the following components:   Glucose-Capillary 164 (*)    All other components within normal limits  CULTURE, BLOOD (ROUTINE X 2)  CULTURE, BLOOD (ROUTINE X  2)  CULTURE, BLOOD (ROUTINE X 2)  CULTURE, BLOOD (ROUTINE X 2)  MAGNESIUM  CK  URINALYSIS, ROUTINE W REFLEX MICROSCOPIC  PROCALCITONIN  LACTIC ACID, PLASMA  BLOOD GAS, ARTERIAL  LEGIONELLA PNEUMOPHILA SEROGP 1 UR AG  STREP PNEUMONIAE URINARY ANTIGEN  URINALYSIS, ROUTINE W REFLEX MICROSCOPIC  URINE DRUG SCREEN, QUALITATIVE ([facility] ONLY)    EKG Treatment is baseline.  Sinus tachycardia rate of 116 bpm.  Normal axis and intervals.  No clear signs of acute ischemia.  RADIOLOGY 1 view CXR interpreted by me with patchy multifocal infiltrates throughout the right-sided lung fields. Repeat 1 view CXR interpreted by me with ETT, left subclavian line and OG tube in good positions.  Infiltrates remain.  Official radiology report(s): DG Chest Portable 1 View  Result Date: 11/12/2022 CLINICAL DATA:  37 year old male intubated, lines and tubes placed. Fentanyl overdose, status post CPR. EXAM: PORTABLE CHEST 1 VIEW COMPARISON:  Portable  chest 0600 hours today. FINDINGS: Portable AP supine view at 0641 hours. Endotracheal tube tip in satisfactory position between the clavicles and carina. Left subclavian central line placed, tip at the cavoatrial junction level. Enteric tube placed into the stomach, terminates at the gastric antrum. Stable to slightly lower lung volumes. Mediastinal contours remain within normal limits. Coarse and confluent right greater than left pulmonary opacity not significantly changed. No pneumothorax identified. Right lateral costophrenic angle not included now. Mild gaseous distension of the stomach. Otherwise negative visible bowel gas. IMPRESSION: 1. Satisfactory ET tube, enteric tube, left subclavian central line. 2. Stable ventilation with coarse and confluent right greater than left pulmonary opacity, possibly pulmonary edema in this setting. Aspiration also a consideration. Electronically Signed   By: Odessa Fleming M.D.   On: 11/12/2022 06:58   DG Chest Portable 1 View  Result Date: 11/12/2022 CLINICAL DATA:  Shortness of breath, overdose, arrest. EXAM: PORTABLE CHEST 1 VIEW COMPARISON:  Portable chest 12/01/2021 FINDINGS: There is mild cardiomegaly. Central vessels are mildly prominent. There is mild interstitial edema in the base of both lungs, trace pleural effusions. The right there is diffuse patchy airspace disease sparing only the lateral basal area and could represent asymmetric edema, pneumonia/aspiration or combination. The left lung is clear of focal infiltrate. The mediastinum is normally outlined. No acute osseous findings. IMPRESSION: 1. Cardiomegaly with mild interstitial edema and trace pleural effusions. 2. Diffuse patchy airspace disease in the right lung sparing only the lateral basal area and could represent asymmetric edema, pneumonia/aspiration or combination. 3. Clinical correlation and radiographic follow-up recommended. Electronically Signed   By: Almira Bar M.D.   On: 11/12/2022 06:42     PROCEDURES and INTERVENTIONS:  .Critical Care  Performed by: Delton Prairie, MD Authorized by: Delton Prairie, MD   Critical care provider statement:    Critical care time (minutes):  30   Critical care time was exclusive of:  Separately billable procedures and treating other patients   Critical care was necessary to treat or prevent imminent or life-threatening deterioration of the following conditions:  Respiratory failure   Critical care was time spent personally by me on the following activities:  Development of treatment plan with patient or surrogate, discussions with consultants, evaluation of patient's response to treatment, examination of patient, ordering and review of laboratory studies, ordering and review of radiographic studies, ordering and performing treatments and interventions, pulse oximetry, re-evaluation of patient's condition and review of old charts .1-3 Lead EKG Interpretation  Performed by: Delton Prairie, MD Authorized by: Delton Prairie, MD  Interpretation: abnormal     ECG rate:  118   ECG rate assessment: tachycardic     Rhythm: sinus tachycardia     Ectopy: none     Conduction: normal   Procedure Name: Intubation Date/Time: 11/12/2022 7:22 AM  Performed by: Vladimir Crofts, MDPre-anesthesia Checklist: Patient identified, Patient being monitored, Emergency Drugs available, Timeout performed and Suction available Oxygen Delivery Method: Non-rebreather mask Preoxygenation: Pre-oxygenation with 100% oxygen Induction Type: Rapid sequence Ventilation: Mask ventilation without difficulty Laryngoscope Size: Glidescope and 4 Tube size: 7.5 mm Number of attempts: 1 Airway Equipment and Method: Rigid stylet Placement Confirmation: ETT inserted through vocal cords under direct vision, CO2 detector and Breath sounds checked- equal and bilateral Secured at: 24 cm Tube secured with: ETT holder    .Central Line  Date/Time: 11/12/2022 7:22 AM  Performed by: Vladimir Crofts, MD Authorized by: Vladimir Crofts, MD   Consent:    Consent obtained:  Emergent situation Pre-procedure details:    Hand hygiene: Hand hygiene performed prior to insertion     Sterile barrier technique: All elements of maximal sterile technique followed     Skin preparation:  Chlorhexidine   Skin preparation agent: Skin preparation agent completely dried prior to procedure   Sedation:    Sedation type:  Deep Procedure details:    Location:  L subclavian   Patient position:  Supine   Procedural supplies:  Triple lumen   Catheter size:  7 Fr   Landmarks identified: yes     Ultrasound guidance: no     Number of attempts:  1   Successful placement: yes   Post-procedure details:    Post-procedure:  Line sutured and dressing applied   Assessment:  Blood return through all ports, free fluid flow, placement verified by x-ray and no pneumothorax on x-ray   Procedure completion:  Tolerated well, no immediate complications   Medications  propofol (DIPRIVAN) 1000 MG/100ML infusion (40 mcg/kg/min  95.3 kg Intravenous Rate/Dose Change 11/12/22 0651)  ceFEPIme (MAXIPIME) 2 g in sodium chloride 0.9 % 100 mL IVPB (has no administration in time range)  vancomycin (VANCOREADY) IVPB 2000 mg/400 mL (has no administration in time range)  docusate (COLACE) 50 MG/5ML liquid 100 mg (has no administration in time range)  polyethylene glycol (MIRALAX / GLYCOLAX) packet 17 g (has no administration in time range)  enoxaparin (LOVENOX) injection 47.5 mg (has no administration in time range)  famotidine (PEPCID) tablet 20 mg (has no administration in time range)  lactated ringers bolus 1,000 mL (1,000 mLs Intravenous New Bag/Given 11/12/22 0618)  etomidate (AMIDATE) injection 20 mg (20 mg Intravenous Given 11/12/22 0622)  rocuronium bromide 10 mg/mL (PF) syringe (100 mg Intravenous Given 11/12/22 0622)  lactated ringers bolus 1,000 mL (1,000 mLs Intravenous New Bag/Given 11/12/22 0619)  LORazepam (ATIVAN)  injection 2 mg (2 mg Intravenous Given 11/12/22 0630)     IMPRESSION / MDM / ASSESSMENT AND PLAN / ED COURSE  I reviewed the triage vital signs and the nursing notes.  Differential diagnosis includes, but is not limited to, intentional overdose, accidental overdose, ARDS, multifocal pneumonia, septic emboli, pulmonary edema  {Patient presents with symptoms of an acute illness or injury that is potentially life-threatening.  37 year old with extensive polysubstance abuse history presents after apparent accidental opiate overdose, with significant respiratory failure requiring intubation and ICU admission.  He is very tachypneic and barely oxygenating with a nonrebreather on arrival.  X-ray with right-sided infiltrates, uncertain if this represents aspiration, pneumonia, edema, or emboli.  Easily  intubated and saturating well on the ventilator with 10 of PEEP.  Blood work with an essentially normal CBC and metabolic panel.  Lactic acidosis is noted and he gets IV fluids.  Normal mag and CK.  I consult ICU for admission.  Venous ultrasound of the right leg to assess for DVT is pending and ICU NP agrees to follow-up on this.  He receives IV antibiotics after blood cultures are drawn for cellulitis and pneumonia coverage.  Clinical Course as of 11/12/22 0725  Sat Nov 12, 2022  0613 Still with poor sats on maxed out NRB.  Tachypneic to 40-50.  I am concerned about respiratory failure.  Will intubate. [DS]  [id number] Intubated. Lined. Well tolerated [DS]  [id number] ICU NP has evaluated the patient.  Excepted to the ICU. [DS]    Clinical Course User Index [DS] Vladimir Crofts, MD     FINAL CLINICAL IMPRESSION(S) / ED DIAGNOSES   Final diagnoses:  Acute respiratory failure with hypoxia ([place])  Opiate overdose, undetermined intent, initial encounter [facility])     Rx / DC Orders   ED Discharge Orders     None        Note:  This document was prepared using Dragon voice recognition software and may  include unintentional dictation errors.   Vladimir Crofts, MD 11/12/22 [phone]

## 2022-11-12 NOTE — H&P (Signed)
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 On this admission he was positive for cocaine and amphetamines. We received patient in ICU intubated on mechanical ventilator so history is via EMR.  He was found down with cyanotic appearance. He was aggitated on arrival and displayed signs of respiratory failure. PCCM consultation due to acute hypoxemic respiratory failure presumably due to drug overdose. He had DVT study done which was negative.   Lines/tubes : Airway 7.5 mm (Active)  Secured at (cm) 24 cm 11/12/22 1542  Measured From Lips 11/12/22 [place] 11/12/22 1542  Secured By [employer] 11/12/22 1542  Tube Holder Repositioned Yes 11/12/22 1542  Prone position No 11/12/22 0725  Cuff Pressure (cm H2O) Green OR 18-26 CmH2O 11/12/22 1542  Site Condition Dry 11/12/22 1542     CVC Triple Lumen 11/12/22 Left Internal jugular 20 cm (Active)     NG/OG Vented/Dual Lumen Oral Marking at nare/corner of mouth 78 cm (Active)  Tube Position (Required) External length of tube 11/12/22 0650  Measurement (cm) (Required) 78 cm 11/12/22 0650  Status Clamped 11/12/22 0650     Urethral Catheter McCaffety, RN Temperature probe;Straight-tip 14 Fr. (Active)    Microbiology/Sepsis markers: Results for orders placed or performed during the hospital encounter of 11/12/22  MRSA Next Gen by PCR, Nasal     Status: None   Collection Time: 11/12/22  9:22 AM   Specimen: Nasal Mucosa; Nasal Swab  Result Value Ref Range Status   MRSA by PCR Next Gen NOT DETECTED NOT DETECTED Final    Comment: (NOTE) The GeneXpert MRSA  Assay (FDA approved for NASAL specimens only), is one component of a comprehensive MRSA colonization surveillance program. It is not intended to diagnose MRSA infection nor to guide or monitor treatment for MRSA infections. Test performance is not FDA approved in patients less than 38 years old. Performed at [facility], [address], [place], [place] [address]     Anti-infectives:  Anti-infectives (From admission, onward)    Start     Dose/Rate Route Frequency Ordered Stop   11/12/22 0645  cefTRIAXone (ROCEPHIN) 2 g in sodium chloride 0.9 % 100 mL IVPB  Status:  Discontinued        2 g 200 mL/hr over 30 Minutes Intravenous  Once 11/12/22 0640 11/12/22 0643   11/12/22 0645  ceFEPIme (MAXIPIME) 2 g in sodium chloride 0.9 % 100 mL IVPB        2 g 200 mL/hr over 30 Minutes Intravenous  Once 11/12/22 0643 11/12/22 0803   11/12/22 0645  vancomycin (VANCOREADY) IVPB 2000 mg/400 mL        2,000 mg 200 mL/hr over 120 Minutes Intravenous  Once 11/12/22 9622 11/12/22 1007        Consults: Treatment Team:  Pccm, Ander Gaster, MD     PAST MEDICAL HISTORY   Past Medical History:  Diagnosis Date   Drug overdose    DVT (deep venous thrombosis) ([place])    Hepatitis C      SURGICAL HISTORY   Past Surgical History:  Procedure Laterality Date   DENTAL SURGERY       FAMILY HISTORY   No family history on file.   SOCIAL HISTORY   Social  History   Tobacco Use   Smoking status: Every Day    Packs/day: 1.00    Years: 20.00    Total pack years: 20.00    Types: Cigarettes   Smokeless tobacco: Never  Substance Use Topics   Alcohol use: Yes    Comment: beer and liquor, binges   Drug use: Yes    Frequency: 1.0 times per week    Types: Marijuana, IV, Cocaine, Methamphetamines    Comment: heroin     MEDICATIONS   Current Medication:  Current Facility-Administered Medications:    Chlorhexidine Gluconate Cloth 2 % PADS 6 each, 6 each, Topical, Q0600,  Karna Christmas, Sherwin Hollingshed, MD   docusate (COLACE) 50 MG/5ML liquid 100 mg, 100 mg, Per Tube, BID PRN, Jimmye Norman, NP   enoxaparin (LOVENOX) injection 47.5 mg, 0.5 mg/kg, Subcutaneous, Q24H, [place], Hubbard Hartshorn, NP   famotidine (PEPCID) tablet 20 mg, 20 mg, Per Tube, BID, [place], Hubbard Hartshorn, NP   norepinephrine (LEVOPHED) 4mg  in (0.016 mg/mL) premix infusion, 0-40 mcg/min, Intravenous, Continuous, , Jamill Wetmore, MD, Last Rate: 18.75 mL/hr at 11/12/22 0853, 5 mcg/min at 11/12/22 0853   Oral care mouth rinse, 15 mL, Mouth Rinse, Q2H, Temitope Flammer, MD   Oral care mouth rinse, 15 mL, Mouth Rinse, PRN, 11/14/22, Shanen Norris, MD   polyethylene glycol (MIRALAX / GLYCOLAX) packet 17 g, 17 g, Per Tube, Daily PRN, Karna Christmas, NP   propofol (DIPRIVAN) 1000 MG/100ML infusion, 5-80 mcg/kg/min, Intravenous, Continuous, Jimmye Norman, MD, Last Rate: 34.3 mL/hr at 11/12/22 1626, 60 mcg/kg/min at 11/12/22 1626    ALLERGIES   Mucomyst [acetylcysteine] and Vicodin [hydrocodone-acetaminophen]    REVIEW OF SYSTEMS     Unable to obtain ROS due to critically ill status on mechanical ventilation  PHYSICAL EXAMINATION   Vital Signs: Temp:  [98.1 F (36.7 C)-100.7 F (38.2 C)] 100.4 F (38 C) (01/13 0918) Pulse Rate:  [84-136] 99 (01/13 0918) Resp:  [19-54] 28 (01/13 0918) BP: (82-181)/(52-123) 112/69 (01/13 0918) SpO2:  [88 %-100 %] 98 % (01/13 1542) FiO2 (%):  [55 %-100 %] 55 % (01/13 1542) Weight:  [87.7 kg-95.3 kg] 87.7 kg (01/13 0918)  GENERAL:Age appropriate on sedation  HEAD: Normocephalic, atraumatic.  EYES: Pupils equal, round, reactive to light.  No scleral icterus.  MOUTH: Moist mucosal membrane. NECK: Supple. No thyromegaly. No nodules. No JVD.  PULMONARY: rhonchi bilaterally  CARDIOVASCULAR: S1 and S2. Regular rate and rhythm. No murmurs, rubs, or gallops.  GASTROINTESTINAL: Soft, nontender, non-distended. No masses. Positive bowel sounds. No  hepatosplenomegaly.  MUSCULOSKELETAL: No swelling, clubbing, or edema.  NEUROLOGIC: GCS4T SKIN:intact,warm,dry   PERTINENT DATA     Infusions:  norepinephrine (LEVOPHED) Adult infusion 5 mcg/min (11/12/22 0853)   propofol (DIPRIVAN) infusion 60 mcg/kg/min (11/12/22 1626)   Scheduled Medications:  Chlorhexidine Gluconate Cloth  6 each Topical Q0600   enoxaparin (LOVENOX) injection  0.5 mg/kg Subcutaneous Q24H   famotidine  20 mg Per Tube BID   mouth rinse  15 mL Mouth Rinse Q2H   PRN Medications: docusate, mouth rinse, polyethylene glycol Hemodynamic parameters:   Intake/Output: No intake/output data recorded.  Ventilator  Settings: Vent Mode: PRVC FiO2 (%):  [55 %-100 %] 55 % Set Rate:  [18 bmp-20 bmp] 20 bmp Vt Set:  [450 mL] 450 mL PEEP:  [8 cmH20-10 cmH20] 8 cmH20 Plateau Pressure:  [18 cmH20] 18 cmH20    LAB RESULTS:  Basic Metabolic Panel: Recent Labs  Lab 11/12/22 0554  NA 137  K 3.8  CL 101  CO2 26  GLUCOSE 141*  BUN 14  CREATININE 0.92  CALCIUM 8.9  MG 2.1   Liver Function Tests: Recent Labs  Lab 11/12/22 0554  AST 68*  ALT 62*  ALKPHOS 62  BILITOT 0.5  PROT 8.3*  ALBUMIN 3.9   No results for input(s): "LIPASE", "AMYLASE" in the last 168 hours. No results for input(s): "AMMONIA" in the last 168 hours. CBC: Recent Labs  Lab 11/12/22 0554  WBC 7.1  NEUTROABS 3.8  HGB 15.1  HCT 45.7  MCV 92.7  PLT 302   Cardiac Enzymes: Recent Labs  Lab 11/12/22 0554  CKTOTAL 289   BNP: Invalid input(s): "POCBNP" CBG: Recent Labs  Lab 11/12/22 0557 11/12/22 1255  GLUCAP 164* 138*       IMAGING RESULTS:  Imaging: US Venous Img Lower Unilateral Right  Result Date: 11/12/2022 CLINICAL DATA:  Right lower extremity swelling and redness for the past 4 days EXAM: RIGHT LOWER EXTREMITY VENOUS DOPPLER ULTRASOUND TECHNIQUE: Gray-scale sonography with compression, as well as color and duplex ultrasound, were performed to evaluate the deep  venous system(s) from the level of the common femoral vein through the popliteal and proximal calf veins. COMPARISON:  None Available. FINDINGS: VENOUS Normal compressibility of the common femoral, superficial femoral, and popliteal veins, as well as the visualized calf veins. Visualized portions of profunda femoral vein and great saphenous vein unremarkable. No filling defects to suggest DVT on grayscale or color Doppler imaging. Doppler waveforms show normal direction of venous flow, normal respiratory plasticity and response to augmentation. Limited views of the contralateral common femoral vein are unremarkable. OTHER Prominent right groin lymph node measures 3.1 x 2.7 x 1.0 cm. The fatty hilum is preserved. Limitations: none IMPRESSION: 1. No evidence of deep venous thrombosis. 2. Prominent right superficial inguinal lymph node is likely reactive to underlying cellulitis or other infectious/inflammatory process. Electronically Signed   By: Jacqulynn Cadet M.D.   On: 11/12/2022 07:57   DG Chest Portable 1 View  Result Date: 11/12/2022 CLINICAL DATA:  37 year old male intubated, lines and tubes placed. Fentanyl overdose, status post CPR. EXAM: PORTABLE CHEST 1 VIEW COMPARISON:  Portable chest 0600 hours today. FINDINGS: Portable AP supine view at 0641 hours. Endotracheal tube tip in satisfactory position between the clavicles and carina. Left subclavian central line placed, tip at the cavoatrial junction level. Enteric tube placed into the stomach, terminates at the gastric antrum. Stable to slightly lower lung volumes. Mediastinal contours remain within normal limits. Coarse and confluent right greater than left pulmonary opacity not significantly changed. No pneumothorax identified. Right lateral costophrenic angle not included now. Mild gaseous distension of the stomach. Otherwise negative visible bowel gas. IMPRESSION: 1. Satisfactory ET tube, enteric tube, left subclavian central line. 2. Stable  ventilation with coarse and confluent right greater than left pulmonary opacity, possibly pulmonary edema in this setting. Aspiration also a consideration. Electronically Signed   By: Genevie Ann M.D.   On: 11/12/2022 06:58   DG Chest Portable 1 View  Result Date: 11/12/2022 CLINICAL DATA:  Shortness of breath, overdose, arrest. EXAM: PORTABLE CHEST 1 VIEW COMPARISON:  Portable chest 12/01/2021 FINDINGS: There is mild cardiomegaly. Central vessels are mildly prominent. There is mild interstitial edema in the base of both lungs, trace pleural effusions. The right there is diffuse patchy airspace disease sparing only the lateral basal area and could represent asymmetric edema, pneumonia/aspiration or combination. The left lung is clear of focal infiltrate. The mediastinum is normally outlined. No acute osseous findings. IMPRESSION:  1. Cardiomegaly with mild interstitial edema and trace pleural effusions. 2. Diffuse patchy airspace disease in the right lung sparing only the lateral basal area and could represent asymmetric edema, pneumonia/aspiration or combination. 3. Clinical correlation and radiographic follow-up recommended. Electronically Signed   By: Almira Bar M.D.   On: 11/12/2022 06:42   @PROBHOSP @ Venous Img Lower Unilateral Right  Result Date: 11/12/2022 CLINICAL DATA:  Right lower extremity swelling and redness for the past 4 days EXAM: RIGHT LOWER EXTREMITY VENOUS DOPPLER ULTRASOUND TECHNIQUE: Gray-scale sonography with compression, as well as color and duplex ultrasound, were performed to evaluate the deep venous system(s) from the level of the common femoral vein through the popliteal and proximal calf veins. COMPARISON:  None Available. FINDINGS: VENOUS Normal compressibility of the common femoral, superficial femoral, and popliteal veins, as well as the visualized calf veins. Visualized portions of profunda femoral vein and great saphenous vein unremarkable. No filling defects to suggest DVT  on grayscale or color Doppler imaging. Doppler waveforms show normal direction of venous flow, normal respiratory plasticity and response to augmentation. Limited views of the contralateral common femoral vein are unremarkable. OTHER Prominent right groin lymph node measures 3.1 x 2.7 x 1.0 cm. The fatty hilum is preserved. Limitations: none IMPRESSION: 1. No evidence of deep venous thrombosis. 2. Prominent right superficial inguinal lymph node is likely reactive to underlying cellulitis or other infectious/inflammatory process. Electronically Signed   By: 11/14/2022 M.D.   On: 11/12/2022 07:57   DG Chest Portable 1 View  Result Date: 11/12/2022 CLINICAL DATA:  37 year old male intubated, lines and tubes placed. Fentanyl overdose, status post CPR. EXAM: PORTABLE CHEST 1 VIEW COMPARISON:  Portable chest 0600 hours today. FINDINGS: Portable AP supine view at 0641 hours. Endotracheal tube tip in satisfactory position between the clavicles and carina. Left subclavian central line placed, tip at the cavoatrial junction level. Enteric tube placed into the stomach, terminates at the gastric antrum. Stable to slightly lower lung volumes. Mediastinal contours remain within normal limits. Coarse and confluent right greater than left pulmonary opacity not significantly changed. No pneumothorax identified. Right lateral costophrenic angle not included now. Mild gaseous distension of the stomach. Otherwise negative visible bowel gas. IMPRESSION: 1. Satisfactory ET tube, enteric tube, left subclavian central line. 2. Stable ventilation with coarse and confluent right greater than left pulmonary opacity, possibly pulmonary edema in this setting. Aspiration also a consideration. Electronically Signed   By: 30 M.D.   On: 11/12/2022 06:58   DG Chest Portable 1 View  Result Date: 11/12/2022 CLINICAL DATA:  Shortness of breath, overdose, arrest. EXAM: PORTABLE CHEST 1 VIEW COMPARISON:  Portable chest 12/01/2021  FINDINGS: There is mild cardiomegaly. Central vessels are mildly prominent. There is mild interstitial edema in the base of both lungs, trace pleural effusions. The right there is diffuse patchy airspace disease sparing only the lateral basal area and could represent asymmetric edema, pneumonia/aspiration or combination. The left lung is clear of focal infiltrate. The mediastinum is normally outlined. No acute osseous findings. IMPRESSION: 1. Cardiomegaly with mild interstitial edema and trace pleural effusions. 2. Diffuse patchy airspace disease in the right lung sparing only the lateral basal area and could represent asymmetric edema, pneumonia/aspiration or combination. 3. Clinical correlation and radiographic follow-up recommended. Electronically Signed   By: 01/29/2022 M.D.   On: 11/12/2022 06:42     ASSESSMENT AND PLAN    -Multidisciplinary rounds held today  Acute Hypoxic Respiratory Failure -with right lung pneumonia  -history  of PE and DVT - s/p [place] lower extermities with no dvt but noted prominent inguinal lymph nodes.  -will also obtain CTPE to rule out PE due to history of PE and current acute hypoxemia -high Aa gradient on most recent ABG    ID -continue IV abx as prescibed -follow up cultures  GI/Nutrition GI PROPHYLAXIS as indicated DIET-->TF's as tolerated Constipation protocol as indicated  ENDO - ICU hypoglycemic\Hyperglycemia protocol -check FSBS per protocol   ELECTROLYTES -follow labs as needed -replace as needed -pharmacy consultation   DVT/GI PRX ordered -SCDs  TRANSFUSIONS AS NEEDED MONITOR FSBS ASSESS the need for LABS as needed    Critical care provider statement:   Total critical care time: 33 minutes   Performed by: Karna Christmas MD   Critical care time was exclusive of separately billable procedures and treating other patients.   Critical care was necessary to treat or prevent imminent or life-threatening deterioration.   Critical care  was time spent personally by me on the following activities: development of treatment plan with patient and/or surrogate as well as nursing, discussions with consultants, evaluation of patient's response to treatment, examination of patient, obtaining history from patient or surrogate, ordering and performing treatments and interventions, ordering and review of laboratory studies, ordering and review of radiographic studies, pulse oximetry and re-evaluation of patient's condition.    Vida Rigger, M.D.  Pulmonary & Critical Care Medicine

## 2022-11-12 NOTE — Progress Notes (Signed)
Patient transported to and from CT in hospital bed on cardiac monitoring and ventilator with this nurse, RT, and transport.  Patient tolerated trip and CT scan well.

## 2022-11-12 NOTE — ED Triage Notes (Signed)
fPt found unconscious by family, fire dept arrived and administerd 2 mg. Narcan.  EMS administered 2 mg Narcan.    Powdered fentanyl and pill fentanyl found in bathroom with patient.   Pt c/o CP, EMS reports family had performed chest compressions.  Pt is alert, agitated, and verbal at this time.

## 2022-11-12 NOTE — Plan of Care (Signed)
Continuing with plan of care. 

## 2022-11-12 NOTE — Consult Note (Signed)
Pharmacy Antibiotic Note  Edwin [name] is a 37 y.o. male admitted on 11/12/2022 with pneumonia.  Pharmacy has been consulted for unasyn dosing.  Plan: Unasyn 3g IV every 6 hours Continue to monitor and dose adjust antibiotics according to renal function and indication   Height: 5\' 10"  (177.8 cm) Weight: 87.7 kg (193 lb 5.5 oz) IBW/kg (Calculated) : 73  Temp (24hrs), Avg:100.6 F (38.1 C), Min:98.1 F (36.7 C), Max:101.1 F (38.4 C)  Recent Labs  Lab 11/12/22 0554  WBC 7.1  CREATININE 0.92  LATICACIDVEN 2.8*    Estimated Creatinine Clearance: 122.7 mL/min (by C-G formula based on SCr of 0.92 mg/dL).    Allergies  Allergen Reactions   Mucomyst [Acetylcysteine]    Vicodin [Hydrocodone-Acetaminophen] Other (See Comments)    Reaction:  GI upset     Antimicrobials this admission: 1/13 vancomycin + cefepime  1/13 unasyn >>    Microbiology results: 1/13 BCx: semt 1/13 MRSA PCR: negative  Thank you for allowing pharmacy to be a part of this patient's care.  Darrick Penna 11/12/2022 5:41 PM

## 2022-11-12 NOTE — Consult Note (Signed)
PHARMACIST - PHYSICIAN COMMUNICATION  CONCERNING:  Enoxaparin (Lovenox) for DVT Prophylaxis    RECOMMENDATION: Patient was prescribed enoxaprin 40mg  q24 hours for VTE prophylaxis.   Filed Weights   11/12/22 0553  Weight: 95.3 kg (210 lb)    Body mass index is 30.13 kg/m.  Estimated Creatinine Clearance: 127.4 mL/min (by C-G formula based on SCr of 0.92 mg/dL).   Based on [place] patient is candidate for enoxaparin 0.5mg /kg TBW SQ every 24 hours based on BMI being >30.  DESCRIPTION: Pharmacy has adjusted enoxaparin dose per [facility] policy.  Patient is now receiving enoxaparin 47.5 mg every 24 hours   Gretel Acre, PharmD PGY1 Pharmacy Resident 11/12/2022 7:12 AM

## 2022-11-12 NOTE — Consult Note (Signed)
PHARMACY CONSULT NOTE - ELECTROLYTES  Pharmacy Consult for Electrolyte Monitoring and Replacement   Recent Labs: Potassium (mmol/L)  Date Value  11/12/2022 3.8  03/24/2014 3.7   Magnesium (mg/dL)  Date Value  11/12/2022 2.1  02/19/2014 2.2   Calcium (mg/dL)  Date Value  11/12/2022 8.9   Calcium, Total (mg/dL)  Date Value  03/24/2014 9.0   Albumin (g/dL)  Date Value  11/12/2022 3.9  03/22/2014 4.2   Phosphorus (mg/dL)  Date Value  12/09/2021 7.1 (H)  02/20/2014 4.5   Sodium (mmol/L)  Date Value  11/12/2022 137  03/24/2014 138   Corrected Ca: 9.0 mg/dL  Assessment  Edwin [name] is a 37 y.o. male presenting with overdose. PMH significant for PSA, MDD, NSTEMI. Pharmacy has been consulted to monitor and replace electrolytes.  Diet: NPO  Goal of Therapy: Electrolytes WNL  Plan:  Potassium: 3.8, no replacement needed Magnesium: 2.1, no replacement needed Check BMP, Mg, Phos with AM labs  Thank you for allowing pharmacy to be a part of this patient's care.  Gretel Acre, PharmD PGY1 Pharmacy Resident 11/12/2022 7:36 AM

## 2022-11-13 LAB — BLOOD GAS, ARTERIAL
Acid-Base Excess: 6 mmol/L — ABNORMAL HIGH (ref 0.0–2.0)
Bicarbonate: 30.6 mmol/L — ABNORMAL HIGH (ref 20.0–28.0)
FIO2: 35 %
MECHVT: 450 mL
Mechanical Rate: 20
O2 Saturation: 99 %
PEEP: 8 cmH2O
Patient temperature: 37
pCO2 arterial: 43 mmHg (ref 32–48)
pH, Arterial: 7.46 — ABNORMAL HIGH (ref 7.35–7.45)
pO2, Arterial: 93 mmHg (ref 83–108)

## 2022-11-13 LAB — CBC
HCT: 38.2 % — ABNORMAL LOW (ref 39.0–52.0)
Hemoglobin: 12.9 g/dL — ABNORMAL LOW (ref 13.0–17.0)
MCH: 30.8 pg (ref 26.0–34.0)
MCHC: 33.8 g/dL (ref 30.0–36.0)
MCV: 91.2 fL (ref 80.0–100.0)
Platelets: 227 10*3/uL (ref 150–400)
RBC: 4.19 MIL/uL — ABNORMAL LOW (ref 4.22–5.81)
RDW: 12.8 % (ref 11.5–15.5)
WBC: 14 10*3/uL — ABNORMAL HIGH (ref 4.0–10.5)
nRBC: 0 % (ref 0.0–0.2)

## 2022-11-13 LAB — BASIC METABOLIC PANEL
Anion gap: 7 (ref 5–15)
BUN: 11 mg/dL (ref 6–20)
CO2: 28 mmol/L (ref 22–32)
Calcium: 8.1 mg/dL — ABNORMAL LOW (ref 8.9–10.3)
Chloride: 103 mmol/L (ref 98–111)
Creatinine, Ser: 0.82 mg/dL (ref 0.61–1.24)
GFR, Estimated: >60 mL/min (ref >60)
Glucose, Bld: 133 mg/dL — ABNORMAL HIGH (ref 70–99)
Potassium: 3.9 mmol/L (ref 3.5–5.1)
Sodium: 138 mmol/L (ref 135–145)

## 2022-11-13 LAB — PHOSPHORUS: Phosphorus: 3.1 mg/dL (ref 2.5–4.6)

## 2022-11-13 LAB — PROCALCITONIN: Procalcitonin: 6.89 ng/mL

## 2022-11-13 LAB — MAGNESIUM: Magnesium: 1.9 mg/dL (ref 1.7–2.4)

## 2022-11-13 LAB — LACTIC ACID, PLASMA: Lactic Acid, Venous: 2.2 mmol/L (ref 0.5–1.9)

## 2022-11-13 LAB — STREP PNEUMONIAE URINARY ANTIGEN: Strep Pneumo Urinary Antigen: NEGATIVE

## 2022-11-13 MED ORDER — FENTANYL 2500MCG IN NS 250ML (10MCG/ML) PREMIX INFUSION
INTRAVENOUS | Status: AC
  Filled 2022-11-13: qty 250

## 2022-11-13 MED ORDER — MIDAZOLAM HCL 2 MG/2ML IJ SOLN: 4.0000 mg | Freq: Once | INTRAMUSCULAR | Status: DC

## 2022-11-13 MED ORDER — SODIUM CHLORIDE 0.9 % IV BOLUS
500.0000 mL | Freq: Once | INTRAVENOUS | Status: AC
  Administered 2022-11-13: 500 mL via INTRAVENOUS

## 2022-11-13 MED ORDER — LACTATED RINGERS IV SOLN: INTRAVENOUS | Status: DC

## 2022-11-13 MED ORDER — PROPOFOL 1000 MG/100ML IV EMUL: 90.0000 ug/kg/min | Freq: Once | INTRAVENOUS | Status: DC

## 2022-11-13 MED ORDER — MIDAZOLAM HCL 2 MG/2ML IJ SOLN
INTRAMUSCULAR | Status: AC
  Administered 2022-11-13: 4 mg
  Filled 2022-11-13: qty 4

## 2022-11-13 MED ORDER — FENTANYL 2500MCG IN NS 250ML (10MCG/ML) PREMIX INFUSION
0.0000 ug/h | INTRAVENOUS | Status: DC
  Administered 2022-11-13: 100 ug/h via INTRAVENOUS
  Administered 2022-11-13 – 2022-11-14 (×2): 275 ug/h via INTRAVENOUS
  Filled 2022-11-13 (×2): qty 250

## 2022-11-13 NOTE — Plan of Care (Signed)
Continuing with plan of care. 

## 2022-11-13 NOTE — Consult Note (Signed)
PHARMACY CONSULT NOTE - ELECTROLYTES  Pharmacy Consult for Electrolyte Monitoring and Replacement   Recent Labs: Potassium (mmol/L)  Date Value  11/13/2022 3.9  03/24/2014 3.7   Magnesium (mg/dL)  Date Value  11/13/2022 1.9  02/19/2014 2.2   Calcium (mg/dL)  Date Value  11/13/2022 8.1 (L)   Calcium, Total (mg/dL)  Date Value  03/24/2014 9.0   Albumin (g/dL)  Date Value  11/12/2022 3.9  03/22/2014 4.2   Phosphorus (mg/dL)  Date Value  11/13/2022 3.1  02/20/2014 4.5   Sodium (mmol/L)  Date Value  11/13/2022 138  03/24/2014 138   Corrected Ca: 9.0 mg/dL  Assessment  Edwin [name] is a 37 y.o. male presenting with overdose. PMH significant for PSA, MDD, NSTEMI. Pharmacy has been consulted to monitor and replace electrolytes.  Diet: NPO  Goal of Therapy: Electrolytes WNL  Plan:  Potassium: 3.8 >> 3.9, no replacement needed Magnesium: 2.1 >> 1.9, no replacement needed Phosphorous: 3.1, no replacement needed  Check BMP, Mg, Phos with AM labs  Thank you for allowing pharmacy to be a part of this patient's care.  Gretel Acre, PharmD PGY1 Pharmacy Resident 11/13/2022 9:37 AM

## 2022-11-13 NOTE — Progress Notes (Signed)
Patient's lactic acid is 2.2 and noted to have decrease in urine output.  Dr. Lanney Gins notified and ordered to taper patient off of propofol and start fentanyl.

## 2022-11-13 NOTE — Procedures (Signed)
PROCEDURE: BRONCHOSCOPY  FOREIGN BODY ASPIRATION     PROCEDURE DATE: 11/13/2022  TIME:  NAME:  Edwin [name]  DOB:Sep 13, 1986  MRN: [id number] LOC:  IC13A/IC13A-AA    HOSP DAY: @LENGTHOFSTAYDAYS @ CODE STATUS:      Code Status Orders  (From admission, onward)           Start     Ordered   11/12/22 0704  Full code  Continuous       Question:  By:  Answer:  Default: patient does not have capacity for decision making, no surrogate or prior directive available   11/12/22 0704           Code Status History     Date Active Date Inactive Code Status Order ID Comments User Context   12/01/2021 0820 12/09/2021 2236 Full Code [id number]  Bradly Bienenstock, NP ED   10/10/2020 2200 10/11/2020 0704 Full Code [id number]  Lucrezia Starch, MD ED   08/27/2018 2018 08/31/2018 1934 Full Code [id number]  Clovis Fredrickson, MD Inpatient   08/11/2018 1459 08/13/2018 1456 Full Code [id number]  Chauncey Mann, MD Inpatient   01/28/2016 1447 02/03/2016 2034 Full Code [id number]  Rainey Pines, MD Inpatient           Indications/Preliminary Diagnosis:   Consent: (Place X beside choice/s below)  The benefits, risks and possible complications of the procedure were        explained to:  ___ patient  _X__ patient's family  ___ other:___________  who verbalized understanding and gave:  ___ verbal  __X_ written  ___ verbal and written  ___ telephone  ___ other:________ consent.      Unable to obtain consent; procedure performed on emergent basis.     Other:       PRESEDATION ASSESSMENT: History and Physical has been performed. Patient meds and allergies have been reviewed. Presedation airway examination has been performed and documented. Baseline vital signs, sedation score, oxygenation status, and cardiac rhythm were reviewed. Patient was deemed to be in satisfactory condition to undergo the procedure.    PREMEDICATIONS:   Sedative/Narcotic Amt Dose   Versed  mg   Fentanyl GTT mcg   Diprivan GTT mg            PROCEDURE DETAILS: Timeout performed and correct patient, name, & ID confirmed. Following prep per Pulmonary policy, appropriate sedation was administered. The Bronchoscope was inserted in to oral cavity with bite block in place. Therapeutic aspiration of Tracheobronchial tree was performed.  Airway exam proceeded with findings, technical procedures, and specimen collection as noted below. At the end of exam the scope was withdrawn without incident. Impression and Plan as noted below.           Airway Prep (Place X beside choice below)   1% Transtracheal Lidocaine Anesthetization 7 cc   Patient prepped per Bronchoscopy Lab Policy       Insertion Route (Place X beside choice below)   Nasal   Oral  X Endotracheal Tube   Tracheostomy   INTRAPROCEDURE MEDICATIONS:  Sedative/Narcotic Amt Dose   Versed 4 mg   Fentanyl  mcg  Diprivan  mg       Medication Amt Dose  Medication Amt Dose  Lidocaine 1%  cc  Epinephrine 1:10,000 sol  cc  Xylocaine 4%  cc  Cocaine  cc   TECHNICAL PROCEDURES: (Place X beside choice below)   Procedures  Description    None     Electrocautery  Cryotherapy     Balloon Dilatation     Bronchography     Stent Placement   X  Therapeutic Aspiration LLL AND RLL    Laser/Argon Plasma    Brachytherapy Catheter Placement   X Foreign Body Removal  RLL       SPECIMENS (Sites): (Place X beside choice below)  Specimens Description   No Specimens Obtained    X Washings LEFT AND RIGHT LUNG   Lavage    Biopsies    Fine Needle Aspirates    Brushings    Sputum    FINDINGS:  ESTIMATED BLOOD LOSS: none COMPLICATIONS/RESOLUTION: none   PATIENT HAD CHUNKS OF WHAT APPEARED TO BE FOOD MATERIAL IN RIGHT AND LEFT LUNG WHICH WAS REMOVED. PATIENT HAD MUCOPURULENT PLUGGING OF BILATERAL AIRWAYS WHICH WAS EVACUATED.  PATIENT HAD BRONCHIAL WASHINGS SENT FOR MICRBOBIOLOGY   IMPRESSION:POST-PROCEDURE DX:    ASPIRATION OF  FOREIGN BODY WITH PNEUMONIA   RECOMMENDATION/PLAN:   FOLLOW UP CULTURES AND NARROW ANTIMICROBIALS PER SENSITIVITIES   Ottie Glazier, M.D.  Pulmonary & [facility]

## 2022-11-13 NOTE — Progress Notes (Addendum)
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 On this admission he was positive for cocaine and amphetamines. We received patient in ICU intubated on mechanical ventilator so history is via EMR.  He was found down with cyanotic appearance. He was aggitated on arrival and displayed signs of respiratory failure. PCCM consultation due to acute hypoxemic respiratory failure presumably due to drug overdose. He had DVT study done which was negative.    11/13/22- patient had SBT today was unable to pass and coughed up chunky material consistent with aspiration of foreign body.  He may need broncoscopy for therapeutic aspiration.    Lines/tubes : Airway 7.5 mm (Active)  Secured at (cm) 24 cm 11/12/22 1542  Measured From Lips 11/12/22 [place] 11/12/22 1542  Secured By [employer] 11/12/22 1542  Tube Holder Repositioned Yes 11/12/22 1542  Prone position No 11/12/22 0725  Cuff Pressure (cm H2O) Green OR 18-26 CmH2O 11/12/22 1542  Site Condition Dry 11/12/22 1542     CVC Triple Lumen 11/12/22 Left Internal jugular 20 cm (Active)     NG/OG Vented/Dual Lumen Oral Marking at nare/corner of mouth 78 cm (Active)  Tube Position (Required) External length of tube 11/12/22 0650  Measurement (cm) (Required) 78 cm 11/12/22 0650  Status Clamped 11/12/22 0650     Urethral Catheter McCaffety, RN Temperature probe;Straight-tip 14 Fr. (Active)    Microbiology/Sepsis markers: Results for orders placed or performed during the hospital encounter of 11/12/22  Blood culture (routine x 2)     Status: None (Preliminary result)    Collection Time: 11/12/22  7:53 AM   Specimen: BLOOD  Result Value Ref Range Status   Specimen Description BLOOD LEFT ANTECUBITAL  Final   Special Requests   Final    BOTTLES DRAWN AEROBIC AND ANAEROBIC Blood Culture adequate volume   Culture   Final    NO GROWTH < 24 HOURS Performed at [facility], [address] [place], [place] [address]    Report Status PENDING  Incomplete  Blood culture (routine x 2)     Status: None (Preliminary result)   Collection Time: 11/12/22  7:54 AM   Specimen: BLOOD  Result Value Ref Range Status   Specimen Description BLOOD RIGHT ANTECUBITAL  Final   Special Requests   Final    BOTTLES DRAWN AEROBIC AND ANAEROBIC Blood Culture adequate volume   Culture   Final    NO GROWTH < 24 HOURS Performed at [facility], [address] [place], [place] [address]    Report Status PENDING  Incomplete  MRSA Next Gen by PCR, Nasal     Status: None   Collection Time: 11/12/22  9:22 AM   Specimen: Nasal Mucosa; Nasal Swab  Result Value Ref Range Status   MRSA by PCR Next Gen NOT DETECTED NOT DETECTED Final    Comment: (NOTE) The GeneXpert MRSA Assay (FDA approved for NASAL specimens only), is one component of a comprehensive MRSA colonization surveillance program. It is not intended to diagnose MRSA infection nor to guide or monitor treatment for MRSA infections. Test performance is not FDA approved in patients less than 81 years old. Performed at [facility], [address] [place], [place] [address]  Anti-infectives:  Anti-infectives (From admission, onward)    Start     Dose/Rate Route Frequency Ordered Stop   11/12/22 1830  Ampicillin-Sulbactam (UNASYN) 3 g in sodium chloride 0.9 % 100 mL IVPB        3 g 200 mL/hr over 30 Minutes Intravenous Every 6 hours 11/12/22 1741     11/12/22 0645  cefTRIAXone (ROCEPHIN) 2 g in sodium chloride 0.9 % 100 mL IVPB  Status:  Discontinued        2 g 200 mL/hr over 30 Minutes  Intravenous  Once 11/12/22 0640 11/12/22 0643   11/12/22 0645  ceFEPIme (MAXIPIME) 2 g in sodium chloride 0.9 % 100 mL IVPB        2 g 200 mL/hr over 30 Minutes Intravenous  Once 11/12/22 0643 11/12/22 0803   11/12/22 0645  vancomycin (VANCOREADY) IVPB 2000 mg/400 mL        2,000 mg 200 mL/hr over 120 Minutes Intravenous  Once 11/12/22 0086 11/12/22 1640        Consults: Treatment Team:  Pccm, Raymond Gurney, MD     PAST MEDICAL HISTORY   Past Medical History:  Diagnosis Date   Drug overdose    DVT (deep venous thrombosis) (HCC)    Hepatitis C      SURGICAL HISTORY   Past Surgical History:  Procedure Laterality Date   DENTAL SURGERY       FAMILY HISTORY   No family history on file.   SOCIAL HISTORY   Social History   Tobacco Use   Smoking status: Every Day    Packs/day: 1.00    Years: 20.00    Total pack years: 20.00    Types: Cigarettes   Smokeless tobacco: Never  Substance Use Topics   Alcohol use: Yes    Comment: beer and liquor, binges   Drug use: Yes    Frequency: 1.0 times per week    Types: Marijuana, IV, Cocaine, Methamphetamines    Comment: heroin     MEDICATIONS   Current Medication:  Current Facility-Administered Medications:    0.9 %  sodium chloride infusion, , Intravenous, PRN, Jimmye Norman, NP, Last Rate: 10 mL/hr at 11/13/22 0800, Infusion Verify at 11/13/22 0800   acetaminophen (TYLENOL) tablet 650 mg, 650 mg, Per Tube, Q6H PRN, Jimmye Norman, NP, 650 mg at 11/12/22 2325   Ampicillin-Sulbactam (UNASYN) 3 g in sodium chloride 0.9 % 100 mL IVPB, 3 g, Intravenous, Q6H, Selinda Eon, RPH, Stopped at 11/13/22 7619   Chlorhexidine Gluconate Cloth 2 % PADS 6 each, 6 each, Topical, Q0600, Vida Rigger, MD, 6 each at 11/13/22 0601   docusate (COLACE) 50 MG/5ML liquid 100 mg, 100 mg, Per Tube, BID PRN, Jimmye Norman, NP   famotidine (PEPCID) tablet 20 mg, 20 mg, Per Tube, BID, [place], Hubbard Hartshorn,  NP, 20 mg at 11/13/22 0803   heparin injection 5,000 Units, 5,000 Units, Subcutaneous, Q8H, Oaklan Persons, MD, 5,000 Units at 11/13/22 0512   norepinephrine (LEVOPHED) 4mg  in (0.016 mg/mL) premix infusion, 0-40 mcg/min, Intravenous, Continuous, Catrena Vari, MD, Last Rate: 18.75 mL/hr at 11/13/22 0800, 5 mcg/min at 11/13/22 0800   Oral care mouth rinse, 15 mL, Mouth Rinse, Q2H, Brian Zeitlin, MD, 15 mL at 11/13/22 0800   Oral care mouth rinse, 15 mL, Mouth Rinse, PRN, 11/15/22, Makaylia Hewett, MD   polyethylene glycol (MIRALAX / GLYCOLAX) packet 17 g, 17 g, Per Tube, Daily PRN, Karna Christmas, NP   propofol (DIPRIVAN) 1000 MG/100ML infusion,  5-80 mcg/kg/min, Intravenous, Continuous, Delton Prairie, MD, Last Rate: 34.3 mL/hr at 11/13/22 0800, 60 mcg/kg/min at 11/13/22 0800    ALLERGIES   Mucomyst [acetylcysteine] and Vicodin [hydrocodone-acetaminophen]    REVIEW OF SYSTEMS     Unable to obtain ROS due to critically ill status on mechanical ventilation  PHYSICAL EXAMINATION   Vital Signs: Temp:  [99.9 F (37.7 C)-101.1 F (38.4 C)] 100 F (37.8 C) (01/14 0835) Pulse Rate:  [85-106] 90 (01/14 0835) Resp:  [16-37] 20 (01/14 0835) BP: (92-112)/(52-69) 98/61 (01/14 0800) SpO2:  [91 %-100 %] 96 % (01/14 0835) FiO2 (%):  [35 %-100 %] 35 % (01/14 0835) Weight:  [83.6 kg-87.7 kg] 83.6 kg (01/14 0500)  GENERAL:Age appropriate on sedation  HEAD: Normocephalic, atraumatic.  EYES: Pupils equal, round, reactive to light.  No scleral icterus.  MOUTH: Moist mucosal membrane. NECK: Supple. No thyromegaly. No nodules. No JVD.  PULMONARY: rhonchi bilaterally  CARDIOVASCULAR: S1 and S2. Regular rate and rhythm. No murmurs, rubs, or gallops.  GASTROINTESTINAL: Soft, nontender, non-distended. No masses. Positive bowel sounds. No hepatosplenomegaly.  MUSCULOSKELETAL: No swelling, clubbing, or edema.  NEUROLOGIC: GCS4T SKIN:intact,warm,dry   PERTINENT DATA     Infusions:   sodium chloride 10 mL/hr at 11/13/22 0800   ampicillin-sulbactam (UNASYN) IV Stopped (11/13/22 5830)   norepinephrine (LEVOPHED) Adult infusion 5 mcg/min (11/13/22 0800)   propofol (DIPRIVAN) infusion 60 mcg/kg/min (11/13/22 0800)   Scheduled Medications:  Chlorhexidine Gluconate Cloth  6 each Topical Q0600   famotidine  20 mg Per Tube BID   heparin injection (subcutaneous)  5,000 Units Subcutaneous Q8H   mouth rinse  15 mL Mouth Rinse Q2H   PRN Medications: sodium chloride, acetaminophen, docusate, mouth rinse, polyethylene glycol Hemodynamic parameters:   Intake/Output: 01/13 0701 - 01/14 0700 In: 5097.4 [I.V.:1297.5; IV Piggyback:3800] Out: 4144 [Urine:4144]  Ventilator  Settings: Vent Mode: PRVC FiO2 (%):  [35 %-100 %] 35 % Set Rate:  [20 bmp] 20 bmp Vt Set:  [450 mL] 450 mL PEEP:  [8 cmH20-10 cmH20] 8 cmH20 Plateau Pressure:  [16 cmH20-19 cmH20] 16 cmH20    LAB RESULTS:  Basic Metabolic Panel: Recent Labs  Lab 11/12/22 0554 11/13/22 0355  NA 137 138  K 3.8 3.9  CL 101 103  CO2 26 28  GLUCOSE 141* 133*  BUN 14 11  CREATININE 0.92 0.82  CALCIUM 8.9 8.1*  MG 2.1 1.9  PHOS  --  3.1    Liver Function Tests: Recent Labs  Lab 11/12/22 0554  AST 68*  ALT 62*  ALKPHOS 62  BILITOT 0.5  PROT 8.3*  ALBUMIN 3.9    No results for input(s): "LIPASE", "AMYLASE" in the last 168 hours. No results for input(s): "AMMONIA" in the last 168 hours. CBC: Recent Labs  Lab 11/12/22 0554 11/13/22 0355  WBC 7.1 14.0*  NEUTROABS 3.8  --   HGB 15.1 12.9*  HCT 45.7 38.2*  MCV 92.7 91.2  PLT 302 227    Cardiac Enzymes: Recent Labs  Lab 11/12/22 0554  CKTOTAL 289    BNP: Invalid input(s): "POCBNP" CBG: Recent Labs  Lab 11/12/22 0557 11/12/22 1255 11/12/22 1929 11/12/22 2309  GLUCAP 164* 138* 131* 131*        IMAGING RESULTS:  Imaging: CT Angio Chest Pulmonary Embolism (PE) W or WO Contrast  Result Date: 11/12/2022 CLINICAL DATA:  Possible drug  overdose with chest pain and shortness of breath, initial encounter EXAM: CT ANGIOGRAPHY CHEST WITH CONTRAST TECHNIQUE: Multidetector CT imaging of the chest was performed  using the standard protocol during bolus administration of intravenous contrast. Multiplanar CT image reconstructions and MIPs were obtained to evaluate the vascular anatomy. RADIATION DOSE REDUCTION: This exam was performed according to the departmental dose-optimization program which includes automated exposure control, adjustment of the mA and/or kV according to patient size and/or use of iterative reconstruction technique. CONTRAST:  69mL OMNIPAQUE IOHEXOL 350 MG/ML SOLN COMPARISON:  Chest x-ray from earlier in the same day. FINDINGS: Cardiovascular: Thoracic aorta shows no aneurysmal dilatation. No evidence of dissection is seen. No cardiac enlargement is noted. No significant coronary calcifications are noted. The pulmonary artery shows a normal branching pattern bilaterally. No evidence of pulmonary embolism is seen. Left subclavian central venous line is noted. Mediastinum/Nodes: Thoracic inlet is within normal limits. Endotracheal tube is noted in satisfactory position. Gastric catheter extends into the stomach. Esophagus as visualized is within normal limits. Some reactive hilar lymph nodes are noted bilaterally. Lungs/Pleura: Lungs show diffuse bilateral infiltrate throughout the right upper, middle and lower lobes with significant consolidation identified in the right lower lobe. Similar but lesser findings are noted within the left lung with consolidation in the left lower lobe medially. No sizable parenchymal nodule is seen. Some inspissated material is noted within the lower lobe bronchial tree bilaterally possibly related to aspiration. Upper Abdomen: Visualized upper abdomen shows no acute abnormality. Musculoskeletal: Multiple left-sided rib fractures are noted anteriorly to include the third through sixth ribs. Healed rib  fractures are noted on the right consistent with prior trauma. Review of the MIP images confirms the above findings. IMPRESSION: No evidence of pulmonary emboli. Cerebral bilateral infiltrates as described worse on the right than the left. Inspissated material is noted in the lower lobe bronchial tree likely related to aspiration. Multiple left-sided rib fractures are noted consistent with the recent CPR. No associated pneumothorax is noted. Electronically Signed   By: Inez Catalina M.D.   On: 11/12/2022 19:12   US Venous Img Lower Unilateral Right  Result Date: 11/12/2022 CLINICAL DATA:  Right lower extremity swelling and redness for the past 4 days EXAM: RIGHT LOWER EXTREMITY VENOUS DOPPLER ULTRASOUND TECHNIQUE: Gray-scale sonography with compression, as well as color and duplex ultrasound, were performed to evaluate the deep venous system(s) from the level of the common femoral vein through the popliteal and proximal calf veins. COMPARISON:  None Available. FINDINGS: VENOUS Normal compressibility of the common femoral, superficial femoral, and popliteal veins, as well as the visualized calf veins. Visualized portions of profunda femoral vein and great saphenous vein unremarkable. No filling defects to suggest DVT on grayscale or color Doppler imaging. Doppler waveforms show normal direction of venous flow, normal respiratory plasticity and response to augmentation. Limited views of the contralateral common femoral vein are unremarkable. OTHER Prominent right groin lymph node measures 3.1 x 2.7 x 1.0 cm. The fatty hilum is preserved. Limitations: none IMPRESSION: 1. No evidence of deep venous thrombosis. 2. Prominent right superficial inguinal lymph node is likely reactive to underlying cellulitis or other infectious/inflammatory process. Electronically Signed   By: Jacqulynn Cadet M.D.   On: 11/12/2022 07:57   DG Chest Portable 1 View  Result Date: 11/12/2022 CLINICAL DATA:  37 year old male intubated,  lines and tubes placed. Fentanyl overdose, status post CPR. EXAM: PORTABLE CHEST 1 VIEW COMPARISON:  Portable chest 0600 hours today. FINDINGS: Portable AP supine view at 0641 hours. Endotracheal tube tip in satisfactory position between the clavicles and carina. Left subclavian central line placed, tip at the cavoatrial junction level. Enteric tube placed  into the stomach, terminates at the gastric antrum. Stable to slightly lower lung volumes. Mediastinal contours remain within normal limits. Coarse and confluent right greater than left pulmonary opacity not significantly changed. No pneumothorax identified. Right lateral costophrenic angle not included now. Mild gaseous distension of the stomach. Otherwise negative visible bowel gas. IMPRESSION: 1. Satisfactory ET tube, enteric tube, left subclavian central line. 2. Stable ventilation with coarse and confluent right greater than left pulmonary opacity, possibly pulmonary edema in this setting. Aspiration also a consideration. Electronically Signed   By: Odessa Fleming M.D.   On: 11/12/2022 06:58   DG Chest Portable 1 View  Result Date: 11/12/2022 CLINICAL DATA:  Shortness of breath, overdose, arrest. EXAM: PORTABLE CHEST 1 VIEW COMPARISON:  Portable chest 12/01/2021 FINDINGS: There is mild cardiomegaly. Central vessels are mildly prominent. There is mild interstitial edema in the base of both lungs, trace pleural effusions. The right there is diffuse patchy airspace disease sparing only the lateral basal area and could represent asymmetric edema, pneumonia/aspiration or combination. The left lung is clear of focal infiltrate. The mediastinum is normally outlined. No acute osseous findings. IMPRESSION: 1. Cardiomegaly with mild interstitial edema and trace pleural effusions. 2. Diffuse patchy airspace disease in the right lung sparing only the lateral basal area and could represent asymmetric edema, pneumonia/aspiration or combination. 3. Clinical correlation and  radiographic follow-up recommended. Electronically Signed   By: Almira Bar M.D.   On: 11/12/2022 06:42   @PROBHOSP @ CT Angio Chest Pulmonary Embolism (PE) W or WO Contrast  Result Date: 11/12/2022 CLINICAL DATA:  Possible drug overdose with chest pain and shortness of breath, initial encounter EXAM: CT ANGIOGRAPHY CHEST WITH CONTRAST TECHNIQUE: Multidetector CT imaging of the chest was performed using the standard protocol during bolus administration of intravenous contrast. Multiplanar CT image reconstructions and MIPs were obtained to evaluate the vascular anatomy. RADIATION DOSE REDUCTION: This exam was performed according to the departmental dose-optimization program which includes automated exposure control, adjustment of the mA and/or kV according to patient size and/or use of iterative reconstruction technique. CONTRAST:  62mL OMNIPAQUE IOHEXOL 350 MG/ML SOLN COMPARISON:  Chest x-ray from earlier in the same day. FINDINGS: Cardiovascular: Thoracic aorta shows no aneurysmal dilatation. No evidence of dissection is seen. No cardiac enlargement is noted. No significant coronary calcifications are noted. The pulmonary artery shows a normal branching pattern bilaterally. No evidence of pulmonary embolism is seen. Left subclavian central venous line is noted. Mediastinum/Nodes: Thoracic inlet is within normal limits. Endotracheal tube is noted in satisfactory position. Gastric catheter extends into the stomach. Esophagus as visualized is within normal limits. Some reactive hilar lymph nodes are noted bilaterally. Lungs/Pleura: Lungs show diffuse bilateral infiltrate throughout the right upper, middle and lower lobes with significant consolidation identified in the right lower lobe. Similar but lesser findings are noted within the left lung with consolidation in the left lower lobe medially. No sizable parenchymal nodule is seen. Some inspissated material is noted within the lower lobe bronchial tree  bilaterally possibly related to aspiration. Upper Abdomen: Visualized upper abdomen shows no acute abnormality. Musculoskeletal: Multiple left-sided rib fractures are noted anteriorly to include the third through sixth ribs. Healed rib fractures are noted on the right consistent with prior trauma. Review of the MIP images confirms the above findings. IMPRESSION: No evidence of pulmonary emboli. Cerebral bilateral infiltrates as described worse on the right than the left. Inspissated material is noted in the lower lobe bronchial tree likely related to aspiration. Multiple left-sided rib fractures are  noted consistent with the recent CPR. No associated pneumothorax is noted. Electronically Signed   By: Alcide Clever M.D.   On: 11/12/2022 19:12     ASSESSMENT AND PLAN    -Multidisciplinary rounds held today  Acute Hypoxic Respiratory Failure -with right lung pneumonia  -history of PE and DVT - s/p [place] lower extermities with no dvt but noted prominent inguinal lymph nodes.  -CT PE with no blood clots and right lung bronchopnuemonia -continue antibiotics and antimicrobial support -continue MV support and consider broncoscoy due to foreing body aspiration of what appeared to be meal contents.  ID -continue IV abx as prescibed -follow up cultures  GI/Nutrition GI PROPHYLAXIS as indicated DIET-->TF's as tolerated Constipation protocol as indicated  ENDO - ICU hypoglycemic\Hyperglycemia protocol -check FSBS per protocol   ELECTROLYTES -follow labs as needed -replace as needed -pharmacy consultation   DVT/GI PRX ordered -SCDs  TRANSFUSIONS AS NEEDED MONITOR FSBS ASSESS the need for LABS as needed    Critical care provider statement:   Total critical care time: 33 minutes   Performed by: Karna Christmas MD   Critical care time was exclusive of separately billable procedures and treating other patients.   Critical care was necessary to treat or prevent imminent or life-threatening  deterioration.   Critical care was time spent personally by me on the following activities: development of treatment plan with patient and/or surrogate as well as nursing, discussions with consultants, evaluation of patient's response to treatment, examination of patient, obtaining history from patient or surrogate, ordering and performing treatments and interventions, ordering and review of laboratory studies, ordering and review of radiographic studies, pulse oximetry and re-evaluation of patient's condition.    Vida Rigger, M.D.  Pulmonary & Critical Care Medicine

## 2022-11-13 NOTE — Consult Note (Signed)
Pharmacy Antibiotic Note  Edwin [name] is a 37 y.o. male admitted on 11/12/2022 with pneumonia. PMH significant for PSA, MDD, NSTEMI. Pharmacy has been consulted for unasyn dosing.  Plan: Day 2 of antibiotics Continue Unasyn 3 g IV Q6H Continue to monitor renal function and follow culture results   Height: 5\' 10"  (177.8 cm) Weight: 83.6 kg (184 lb 4.9 oz) IBW/kg (Calculated) : 73  Temp (24hrs), Avg:100.5 F (38.1 C), Min:99.7 F (37.6 C), Max:101.1 F (38.4 C)  Recent Labs  Lab 11/12/22 0554 11/13/22 0355  WBC 7.1 14.0*  CREATININE 0.92 0.82  LATICACIDVEN 2.8*  --      Estimated Creatinine Clearance: 127.4 mL/min (by C-G formula based on SCr of 0.82 mg/dL).    Allergies  Allergen Reactions   Mucomyst [Acetylcysteine]    Vicodin [Hydrocodone-Acetaminophen] Other (See Comments)    Reaction:  GI upset     Antimicrobials this admission: 1/13 Vancomycin x1 1/13 Cefepime x1 1/13 Unasyn >>   Microbiology results: 1/13 BCx: NG<24H 1/13 MRSA PCR: negative  Thank you for allowing pharmacy to be a part of this patient's care.  Gretel Acre, PharmD PGY1 Pharmacy Resident 11/13/2022 9:50 AM

## 2022-11-13 NOTE — Progress Notes (Signed)
At 1012 Dr. Lanney Gins performed a bronchosopy on the patient after obtaining consent and performing time-out before procedure.  Patient became agitated and began to move vigorously during procedure, Dr. Lanney Gins ordered for patient's propofol to be increased to 90 at this time, patient continued agitated and continued to move vigorously and Dr. Lanney Gins ordered for versed 4mg  IV push to be administered.  Procedure ended at 1019 and propofol was decreased back down to 60, patient resting comfortably in bed at this time.

## 2022-11-13 NOTE — Progress Notes (Signed)
AT [phone] Dr. Lanney Gins notified this nurse that he had turned off the patient's propofol to perform a wake up assessment.  Patient initially was mildly anxious, but then able to follow commands.  RT at bedside and Dr. Lanney Gins came to bedside to perform a wake up assessment as well. Initially Dr. Lanney Gins ordered for patient to be extubated, but shortly after this order was given patient began to cough forcefully resulting in copious amounts of brown curdled substance to come out of his ETT tube and ultimately oxygen saturation went down to 74% and patient was only able to recover up to 82% then began coughing up brown curdled substance again in his ETT tube.  Dr. Lanney Gins came to bedside and ordered for patient to be re sedated and ordered for patient not to be extubated. Dr. Lanney Gins further stated the patient would need a bronchoscopy and Dr. Lanney Gins updated family and obtained consent from family.

## 2022-11-14 LAB — GLUCOSE, CAPILLARY
Glucose-Capillary: 86 mg/dL (ref 70–99)
Glucose-Capillary: 111 mg/dL — ABNORMAL HIGH (ref 70–99)
Glucose-Capillary: 175 mg/dL — ABNORMAL HIGH (ref 70–99)
Glucose-Capillary: 135 mg/dL — ABNORMAL HIGH (ref 70–99)

## 2022-11-14 LAB — BASIC METABOLIC PANEL
Anion gap: 3 — ABNORMAL LOW (ref 5–15)
BUN: 12 mg/dL (ref 6–20)
CO2: 31 mmol/L (ref 22–32)
Calcium: 7.8 mg/dL — ABNORMAL LOW (ref 8.9–10.3)
Chloride: 104 mmol/L (ref 98–111)
Creatinine, Ser: 0.67 mg/dL (ref 0.61–1.24)
GFR, Estimated: >60 mL/min (ref >60)
Glucose, Bld: 91 mg/dL (ref 70–99)
Potassium: 3.5 mmol/L (ref 3.5–5.1)
Sodium: 138 mmol/L (ref 135–145)

## 2022-11-14 LAB — PROCALCITONIN: Procalcitonin: 4.11 ng/mL

## 2022-11-14 LAB — MAGNESIUM: Magnesium: 1.9 mg/dL (ref 1.7–2.4)

## 2022-11-14 LAB — PHOSPHORUS: Phosphorus: 2.4 mg/dL — ABNORMAL LOW (ref 2.5–4.6)

## 2022-11-14 MED ORDER — ORAL CARE MOUTH RINSE: 15.0000 mL | OROMUCOSAL | Status: DC | PRN

## 2022-11-14 MED ORDER — POTASSIUM CHLORIDE 10 MEQ/100ML IV SOLN
10.0000 meq | INTRAVENOUS | Status: AC
  Administered 2022-11-14 (×2): 10 meq via INTRAVENOUS
  Filled 2022-11-14 (×2): qty 100

## 2022-11-14 MED ORDER — ACETAMINOPHEN 325 MG PO TABS
650.0000 mg | ORAL_TABLET | Freq: Four times a day (QID) | ORAL | Status: DC | PRN
  Administered 2022-11-15: 650 mg via ORAL
  Filled 2022-11-14: qty 2

## 2022-11-14 MED ORDER — ONDANSETRON HCL 4 MG/2ML IJ SOLN
4.0000 mg | Freq: Four times a day (QID) | INTRAMUSCULAR | Status: DC | PRN
  Administered 2022-11-14: 4 mg via INTRAVENOUS
  Filled 2022-11-14: qty 2

## 2022-11-14 NOTE — Consult Note (Signed)
PHARMACY CONSULT NOTE - ELECTROLYTES  Pharmacy Consult for Electrolyte Monitoring and Replacement   Recent Labs: Potassium (mmol/L)  Date Value  11/14/2022 3.5  03/24/2014 3.7   Magnesium (mg/dL)  Date Value  11/14/2022 1.9  02/19/2014 2.2   Calcium (mg/dL)  Date Value  11/14/2022 7.8 (L)   Calcium, Total (mg/dL)  Date Value  03/24/2014 9.0   Albumin (g/dL)  Date Value  11/12/2022 3.9  03/22/2014 4.2   Phosphorus (mg/dL)  Date Value  11/14/2022 2.4 (L)  02/20/2014 4.5   Sodium (mmol/L)  Date Value  11/14/2022 138  03/24/2014 138   Corrected Ca: 9.0 mg/dL  Assessment  Edwin [name] is a 37 y.o. male presenting with overdose. PMH significant for PSA, MDD, NSTEMI. Pharmacy has been consulted to monitor and replace electrolytes.  MIVF: lactated ringers at 50 mL/hr  Goal of Therapy: Electrolytes WNL  Plan:  10 meq IV KCl x 2 Repeat electrolytes in am  Thank you for allowing pharmacy to be a part of this patient's care.  Vallery Sa, PharmD, BCPS 11/14/2022 7:00 AM

## 2022-11-14 NOTE — Progress Notes (Signed)
Pt extubated without complications, no stridor noted, placed on 3lpm [place], sats 94%, respiratory rate 16/min.

## 2022-11-14 NOTE — Progress Notes (Signed)
Patient passed [place]. Soft diet ordered by MD Aleskerov. Patient assisted with lunch tray by this RN.

## 2022-11-14 NOTE — Progress Notes (Addendum)
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 On this admission he was positive for cocaine and amphetamines. We received patient in ICU intubated on mechanical ventilator so history is via EMR.  He was found down with cyanotic appearance. He was aggitated on arrival and displayed signs of respiratory failure. PCCM consultation due to acute hypoxemic respiratory failure presumably due to drug overdose. He had DVT study done which was negative.    11/13/22- patient had SBT today was unable to pass and coughed up chunky material consistent with aspiration of foreign body.  He may need broncoscopy for therapeutic aspiration.   11/14/22- patient extubated to nasal canula and is conversive off all infusions.  Will order CIWA protocol and optimize for TRH tranfer.   Lines/tubes : Airway 7.5 mm (Active)  Secured at (cm) 24 cm 11/12/22 1542  Measured From Lips 11/12/22 1542  Secured Location Center 11/12/22 1542  Secured By [employer] 11/12/22 1542  Tube Holder Repositioned Yes 11/12/22 1542  Prone position No 11/12/22 0725  Cuff Pressure (cm H2O) Green OR 18-26 CmH2O 11/12/22 1542  Site Condition Dry 11/12/22 1542     CVC Triple Lumen 11/12/22 Left Internal jugular 20 cm (Active)     NG/OG Vented/Dual Lumen Oral Marking at nare/corner of mouth 78 cm (Active)  Tube Position (Required) External length of tube 11/12/22 0650  Measurement (cm) (Required) 78 cm 11/12/22 0650  Status Clamped 11/12/22 0650     Urethral Catheter McCaffety, RN Temperature probe;Straight-tip 14 Fr. (Active)    Microbiology/Sepsis markers: Results for  orders placed or performed during the hospital encounter of 11/12/22  Blood culture (routine x 2)     Status: None (Preliminary result)   Collection Time: 11/12/22  7:53 AM   Specimen: BLOOD  Result Value Ref Range Status   Specimen Description BLOOD LEFT ANTECUBITAL  Final   Special Requests   Final    BOTTLES DRAWN AEROBIC AND ANAEROBIC Blood Culture adequate volume   Culture   Final    NO GROWTH 2 DAYS Performed at [facility], [address], [place], [place] [address]    Report Status PENDING  Incomplete  Blood culture (routine x 2)     Status: None (Preliminary result)   Collection Time: 11/12/22  7:54 AM   Specimen: BLOOD  Result Value Ref Range Status   Specimen Description BLOOD RIGHT ANTECUBITAL  Final   Special Requests   Final    BOTTLES DRAWN AEROBIC AND ANAEROBIC Blood Culture adequate volume   Culture   Final    NO GROWTH 2 DAYS Performed at [facility], [address], [place], [place] [address]    Report Status PENDING  Incomplete  MRSA Next Gen by PCR, Nasal     Status: None   Collection Time: 11/12/22  9:22 AM   Specimen: Nasal Mucosa; Nasal Swab  Result Value Ref Range Status   MRSA by PCR Next Gen NOT DETECTED NOT DETECTED Final    Comment: (NOTE) The GeneXpert MRSA Assay (FDA approved for NASAL specimens only), is one component of a comprehensive MRSA colonization surveillance program. It is not intended to diagnose MRSA infection nor to guide or monitor treatment for MRSA infections. Test performance is not FDA approved in  patients less than 69 years old. Performed at [facility], [address] [place], [place] [address]   Culture, Respiratory w Gram Stain     Status: None (Preliminary result)   Collection Time: 11/13/22 10:59 AM   Specimen: Bronchial Wash; Respiratory  Result Value Ref Range Status   Specimen Description   Final    BRONCHIAL WASHINGS Performed at [facility], [address],  [place], [place] [address]    Special Requests   Final    NONE Performed at [facility], [address] [place], [place] [address]    Gram Stain   Final    RARE GRAM POSITIVE COCCI IN SINGLES IN PAIRS NO WBC SEEN Performed at [facility], [place] [address] [place], [place] [address]    Culture PENDING  Incomplete   Report Status PENDING  Incomplete    Anti-infectives:  Anti-infectives (From admission, onward)    Start     Dose/Rate Route Frequency Ordered Stop   11/12/22 1830  Ampicillin-Sulbactam (UNASYN) 3 g in sodium chloride 0.9 % 100 mL IVPB        3 g 200 mL/hr over 30 Minutes Intravenous Every 6 hours 11/12/22 1741     11/12/22 0645  cefTRIAXone (ROCEPHIN) 2 g in sodium chloride 0.9 % 100 mL IVPB  Status:  Discontinued        2 g 200 mL/hr over 30 Minutes Intravenous  Once 11/12/22 0640 11/12/22 0643   11/12/22 0645  ceFEPIme (MAXIPIME) 2 g in sodium chloride 0.9 % 100 mL IVPB        2 g 200 mL/hr over 30 Minutes Intravenous  Once 11/12/22 0643 11/12/22 0803   11/12/22 0645  vancomycin (VANCOREADY) IVPB 2000 mg/400 mL        2,000 mg 200 mL/hr over 120 Minutes Intravenous  Once 11/12/22 9794 11/12/22 1640        Consults: Treatment Team:  Pccm, Ander Gaster, MD     PAST MEDICAL HISTORY   Past Medical History:  Diagnosis Date   Drug overdose    DVT (deep venous thrombosis) ([place])    Hepatitis C      SURGICAL HISTORY   Past Surgical History:  Procedure Laterality Date   DENTAL SURGERY       FAMILY HISTORY   No family history on file.   SOCIAL HISTORY   Social History   Tobacco Use   Smoking status: Every Day    Packs/day: 1.00    Years: 20.00    Total pack years: 20.00    Types: Cigarettes   Smokeless tobacco: Never  Substance Use Topics   Alcohol use: Yes    Comment: beer and liquor, binges   Drug use: Yes    Frequency: 1.0 times per week    Types: Marijuana, IV, Cocaine, Methamphetamines    Comment: heroin      MEDICATIONS   Current Medication:  Current Facility-Administered Medications:    0.9 %  sodium chloride infusion, , Intravenous, PRN, Lang Snow, NP, Last Rate: 10 mL/hr at 11/14/22 0845, Infusion Verify at 11/14/22 0845   acetaminophen (TYLENOL) tablet 650 mg, 650 mg, Per Tube, Q6H PRN, Lang Snow, NP, 650 mg at 11/13/22 2016   Ampicillin-Sulbactam (UNASYN) 3 g in sodium chloride 0.9 % 100 mL IVPB, 3 g, Intravenous, Q6H, Darrick Penna, RPH, Stopped at 11/14/22 0636   Chlorhexidine Gluconate Cloth 2 % PADS 6 each, 6 each, Topical, Q0600, Ottie Glazier, MD, 6  each at 11/13/22 2117   docusate (COLACE) 50 MG/5ML liquid 100 mg, 100 mg, Per Tube, BID PRN, Lang Snow, NP   famotidine (PEPCID) tablet 20 mg, 20 mg, Per Tube, BID, Lang Snow, NP, 20 mg at 11/13/22 2116   fentaNYL 2547mcg in NS 271mL (54mcg/ml) infusion-PREMIX, 0-400 mcg/hr, Intravenous, Continuous, Ottie Glazier, MD, Last Rate: 15 mL/hr at 11/14/22 0845, 150 mcg/hr at 11/14/22 0845   heparin injection 5,000 Units, 5,000 Units, Subcutaneous, Q8H, Ottie Glazier, MD, 5,000 Units at 11/14/22 0604   lactated ringers infusion, , Intravenous, Continuous, Ottie Glazier, MD, Last Rate: 50 mL/hr at 11/14/22 0845, Infusion Verify at 11/14/22 0845   midazolam (VERSED) injection 4 mg, 4 mg, Intravenous, Once, Ottie Glazier, MD   norepinephrine (LEVOPHED) 4mg  in 297mL (0.016 mg/mL) premix infusion, 0-40 mcg/min, Intravenous, Continuous, Ottie Glazier, MD, Last Rate: 11.25 mL/hr at 11/14/22 0845, 3 mcg/min at 11/14/22 0845   Oral care mouth rinse, 15 mL, Mouth Rinse, Q2H, Anberlyn Feimster, MD, 15 mL at 11/14/22 0814   Oral care mouth rinse, 15 mL, Mouth Rinse, PRN, Lanney Gins, Marylan Glore, MD   polyethylene glycol (MIRALAX / GLYCOLAX) packet 17 g, 17 g, Per Tube, Daily PRN, [place], Bing Neighbors, NP   potassium chloride 10 mEq in 100 mL IVPB, 10 mEq, Intravenous, Q1 Hr x 2, Dallie Piles, RPH   propofol (DIPRIVAN) 1000 MG/100ML infusion, 5-80 mcg/kg/min, Intravenous, Continuous, Vladimir Crofts, MD, Last Rate: 8.58 mL/hr at 11/14/22 0845, 15 mcg/kg/min at 11/14/22 0845   propofol (DIPRIVAN) 1000 MG/100ML infusion, 90 mcg/kg/min, Intravenous, Once, Lakeeta Dobosz, MD    ALLERGIES   Mucomyst [acetylcysteine] and Vicodin [hydrocodone-acetaminophen]    REVIEW OF SYSTEMS     Unable to obtain ROS due to critically ill status on mechanical ventilation  PHYSICAL EXAMINATION   Vital Signs: Temp:  [97.7 F (36.5 C)-100.9 F (38.3 C)] 100.4 F (38 C) (01/15 0800) Pulse Rate:  [61-101] 74 (01/15 0800) Resp:  [8-38] 20 (01/15 0800) BP: (89-127)/(45-91) 98/51 (01/15 0800) SpO2:  [87 %-100 %] 97 % (01/15 0900) FiO2 (%):  [40 %-50 %] 40 % (01/15 0900) Weight:  [84.6 kg] 84.6 kg (01/15 0318)  GENERAL:Age appropriate on sedation  HEAD: Normocephalic, atraumatic.  EYES: Pupils equal, round, reactive to light.  No scleral icterus.  MOUTH: Moist mucosal membrane. NECK: Supple. No thyromegaly. No nodules. No JVD.  PULMONARY: rhonchi bilaterally  CARDIOVASCULAR: S1 and S2. Regular rate and rhythm. No murmurs, rubs, or gallops.  GASTROINTESTINAL: Soft, nontender, non-distended. No masses. Positive bowel sounds. No hepatosplenomegaly.  MUSCULOSKELETAL: No swelling, clubbing, or edema.  NEUROLOGIC: GCS4T SKIN:intact,warm,dry   PERTINENT DATA     Infusions:  sodium chloride 10 mL/hr at 11/14/22 0845   ampicillin-sulbactam (UNASYN) IV Stopped (11/14/22 0636)   fentaNYL infusion INTRAVENOUS 150 mcg/hr (11/14/22 0845)   lactated ringers 50 mL/hr at 11/14/22 0845   norepinephrine (LEVOPHED) Adult infusion 3 mcg/min (11/14/22 0845)   potassium chloride     propofol (DIPRIVAN) infusion 15 mcg/kg/min (11/14/22 0845)   propofol (DIPRIVAN) infusion     Scheduled Medications:  Chlorhexidine Gluconate Cloth  6 each Topical Q0600   famotidine  20 mg Per Tube BID    heparin injection (subcutaneous)  5,000 Units Subcutaneous Q8H   midazolam  4 mg Intravenous Once   mouth rinse  15 mL Mouth Rinse Q2H   PRN Medications: sodium chloride, acetaminophen, docusate, mouth rinse, polyethylene glycol Hemodynamic parameters:   Intake/Output: 01/14 0701 - 01/15 0700 In: 3094.4 [I.V.:2104.2; NG/GT:90; IV  Piggyback:900.2] Out: 1510 [Urine:1310; Emesis/NG output:200]  Ventilator  Settings: Vent Mode: Spontaneous FiO2 (%):  [40 %-50 %] 40 % Set Rate:  [20 bmp] 20 bmp Vt Set:  [450 mL] 450 mL PEEP:  [5 cmH20-8 cmH20] 5 cmH20 Pressure Support:  [5 cmH20] 5 cmH20 Plateau Pressure:  [19 cmH20-20 cmH20] 19 cmH20    LAB RESULTS:  Basic Metabolic Panel: Recent Labs  Lab 11/12/22 0554 11/13/22 0355 11/14/22 0422  NA 137 138 138  K 3.8 3.9 3.5  CL 101 103 104  CO2 26 28 31   GLUCOSE 141* 133* 91  BUN 14 11 12   CREATININE 0.92 0.82 0.67  CALCIUM 8.9 8.1* 7.8*  MG 2.1 1.9 1.9  PHOS  --  3.1 2.4*    Liver Function Tests: Recent Labs  Lab 11/12/22 0554  AST 68*  ALT 62*  ALKPHOS 62  BILITOT 0.5  PROT 8.3*  ALBUMIN 3.9    No results for input(s): "LIPASE", "AMYLASE" in the last 168 hours. No results for input(s): "AMMONIA" in the last 168 hours. CBC: Recent Labs  Lab 11/12/22 0554 11/13/22 0355  WBC 7.1 14.0*  NEUTROABS 3.8  --   HGB 15.1 12.9*  HCT 45.7 38.2*  MCV 92.7 91.2  PLT 302 227    Cardiac Enzymes: Recent Labs  Lab 11/12/22 0554  CKTOTAL 289    BNP: Invalid input(s): "POCBNP" CBG: Recent Labs  Lab 11/12/22 0557 11/12/22 1255 11/12/22 1929 11/12/22 2309 11/14/22 0733  GLUCAP 164* 138* 131* 131* 86        IMAGING RESULTS:  Imaging: CT Angio Chest Pulmonary Embolism (PE) W or WO Contrast  Addendum Date: 11/13/2022   ADDENDUM REPORT: 11/13/2022 20:01 ADDENDUM: In the impression section the sentence should read: Considerable bilateral infiltrates as described worse on the right than the left. Electronically  Signed   By: 11/15/2022 M.D.   On: 11/13/2022 20:01   Result Date: 11/13/2022 CLINICAL DATA:  Possible drug overdose with chest pain and shortness of breath, initial encounter EXAM: CT ANGIOGRAPHY CHEST WITH CONTRAST TECHNIQUE: Multidetector CT imaging of the chest was performed using the standard protocol during bolus administration of intravenous contrast. Multiplanar CT image reconstructions and MIPs were obtained to evaluate the vascular anatomy. RADIATION DOSE REDUCTION: This exam was performed according to the departmental dose-optimization program which includes automated exposure control, adjustment of the mA and/or kV according to patient size and/or use of iterative reconstruction technique. CONTRAST:  49mL OMNIPAQUE IOHEXOL 350 MG/ML SOLN COMPARISON:  Chest x-ray from earlier in the same day. FINDINGS: Cardiovascular: Thoracic aorta shows no aneurysmal dilatation. No evidence of dissection is seen. No cardiac enlargement is noted. No significant coronary calcifications are noted. The pulmonary artery shows a normal branching pattern bilaterally. No evidence of pulmonary embolism is seen. Left subclavian central venous line is noted. Mediastinum/Nodes: Thoracic inlet is within normal limits. Endotracheal tube is noted in satisfactory position. Gastric catheter extends into the stomach. Esophagus as visualized is within normal limits. Some reactive hilar lymph nodes are noted bilaterally. Lungs/Pleura: Lungs show diffuse bilateral infiltrate throughout the right upper, middle and lower lobes with significant consolidation identified in the right lower lobe. Similar but lesser findings are noted within the left lung with consolidation in the left lower lobe medially. No sizable parenchymal nodule is seen. Some inspissated material is noted within the lower lobe bronchial tree bilaterally possibly related to aspiration. Upper Abdomen: Visualized upper abdomen shows no acute abnormality. Musculoskeletal:  Multiple left-sided rib fractures are noted  anteriorly to include the third through sixth ribs. Healed rib fractures are noted on the right consistent with prior trauma. Review of the MIP images confirms the above findings. IMPRESSION: No evidence of pulmonary emboli. Cerebral bilateral infiltrates as described worse on the right than the left. Inspissated material is noted in the lower lobe bronchial tree likely related to aspiration. Multiple left-sided rib fractures are noted consistent with the recent CPR. No associated pneumothorax is noted. Electronically Signed: By: Alcide Clever M.D. On: 11/12/2022 19:12   @PROBHOSP @ No results found.   ASSESSMENT AND PLAN    -Multidisciplinary rounds held today  Acute Hypoxic Respiratory Failure -with right lung pneumonia  -history of PE and DVT - s/p lower extermities with no dvt but noted prominent inguinal lymph nodes.  -CT PE with no blood clots and right lung bronchopnuemonia-s/p bronchoscopy - GPCs+ on UNASYN -continue antibiotics and antimicrobial support -continue respiratory support with suctioning and nasal canula post extubation  ID -continue IV abx as prescibed -follow up cultures  GI/Nutrition GI PROPHYLAXIS as indicated DIET-->TF's as tolerated Constipation protocol as indicated  ENDO - ICU hypoglycemic\Hyperglycemia protocol -check FSBS per protocol   ELECTROLYTES -follow labs as needed -replace as needed -pharmacy consultation   DVT/GI PRX ordered -SCDs  TRANSFUSIONS AS NEEDED MONITOR FSBS ASSESS the need for LABS as needed    Critical care provider statement:   Total critical care time: 33 minutes   Performed by: [place] MD   Critical care time was exclusive of separately billable procedures and treating other patients.   Critical care was necessary to treat or prevent imminent or life-threatening deterioration.   Critical care was time spent personally by me on the following activities: development  of treatment plan with patient and/or surrogate as well as nursing, discussions with consultants, evaluation of patient's response to treatment, examination of patient, obtaining history from patient or surrogate, ordering and performing treatments and interventions, ordering and review of laboratory studies, ordering and review of radiographic studies, pulse oximetry and re-evaluation of patient's condition.    Karna Christmas, M.D.  Pulmonary & Critical Care Medicine

## 2022-11-14 NOTE — Progress Notes (Signed)
Patient's mother updated within RN's scope of practice. All questions answered. Mother provided patient password to receive patient information.

## 2022-11-15 DIAGNOSIS — F1190 Opioid use, unspecified, uncomplicated: Secondary | ICD-10-CM

## 2022-11-15 DIAGNOSIS — T50901A Poisoning by unspecified drugs, medicaments and biological substances, accidental (unintentional), initial encounter: Secondary | ICD-10-CM

## 2022-11-15 DIAGNOSIS — J690 Pneumonitis due to inhalation of food and vomit: Secondary | ICD-10-CM

## 2022-11-15 LAB — BASIC METABOLIC PANEL
Anion gap: 5 (ref 5–15)
BUN: 9 mg/dL (ref 6–20)
CO2: 27 mmol/L (ref 22–32)
Calcium: 7.6 mg/dL — ABNORMAL LOW (ref 8.9–10.3)
Chloride: 104 mmol/L (ref 98–111)
Creatinine, Ser: 0.6 mg/dL — ABNORMAL LOW (ref 0.61–1.24)
GFR, Estimated: >60 mL/min (ref >60)
Glucose, Bld: 87 mg/dL (ref 70–99)
Potassium: 3.3 mmol/L — ABNORMAL LOW (ref 3.5–5.1)
Sodium: 136 mmol/L (ref 135–145)

## 2022-11-15 LAB — CBC WITH DIFFERENTIAL/PLATELET
Abs Immature Granulocytes: 0.05 10*3/uL (ref 0.00–0.07)
Basophils Absolute: 0 10*3/uL (ref 0.0–0.1)
Basophils Relative: 0 %
Eosinophils Absolute: 0.4 10*3/uL (ref 0.0–0.5)
Eosinophils Relative: 4 %
HCT: 32.5 % — ABNORMAL LOW (ref 39.0–52.0)
Hemoglobin: 11.1 g/dL — ABNORMAL LOW (ref 13.0–17.0)
Immature Granulocytes: 1 %
Lymphocytes Relative: 26 %
Lymphs Abs: 2.3 10*3/uL (ref 0.7–4.0)
MCH: 31.1 pg (ref 26.0–34.0)
MCHC: 34.2 g/dL (ref 30.0–36.0)
MCV: 91 fL (ref 80.0–100.0)
Monocytes Absolute: 0.9 10*3/uL (ref 0.1–1.0)
Monocytes Relative: 9 %
Neutro Abs: 5.4 10*3/uL (ref 1.7–7.7)
Neutrophils Relative %: 60 %
Platelets: 204 10*3/uL (ref 150–400)
RBC: 3.57 MIL/uL — ABNORMAL LOW (ref 4.22–5.81)
RDW: 12.8 % (ref 11.5–15.5)
WBC: 9.1 10*3/uL (ref 4.0–10.5)
nRBC: 0 % (ref 0.0–0.2)

## 2022-11-15 LAB — GLUCOSE, CAPILLARY
Glucose-Capillary: 133 mg/dL — ABNORMAL HIGH (ref 70–99)
Glucose-Capillary: 152 mg/dL — ABNORMAL HIGH (ref 70–99)
Glucose-Capillary: 81 mg/dL (ref 70–99)
Glucose-Capillary: 91 mg/dL (ref 70–99)

## 2022-11-15 LAB — PHOSPHORUS: Phosphorus: 3.2 mg/dL (ref 2.5–4.6)

## 2022-11-15 LAB — MAGNESIUM: Magnesium: 1.7 mg/dL (ref 1.7–2.4)

## 2022-11-15 MED ORDER — BUPRENORPHINE HCL-NALOXONE HCL 2-0.5 MG SL FILM: 1.0000 | ORAL_FILM | Freq: Four times a day (QID) | SUBLINGUAL | 0 refills | Status: DC | PRN

## 2022-11-15 MED ORDER — ONDANSETRON 8 MG PO TBDP: 8.0000 mg | ORAL_TABLET | Freq: Three times a day (TID) | ORAL | 0 refills | Status: DC | PRN

## 2022-11-15 MED ORDER — POTASSIUM CHLORIDE 20 MEQ PO PACK
40.0000 meq | PACK | Freq: Once | ORAL | Status: AC
  Administered 2022-11-15: 40 meq via ORAL
  Filled 2022-11-15: qty 2

## 2022-11-15 MED ORDER — AMOXICILLIN-POT CLAVULANATE 500-125 MG PO TABS: 1.0000 | ORAL_TABLET | Freq: Two times a day (BID) | ORAL | 0 refills | Status: DC

## 2022-11-15 MED ORDER — BUPRENORPHINE HCL-NALOXONE HCL 8-2 MG SL FILM: 1.0000 | ORAL_FILM | Freq: Every day | SUBLINGUAL | 0 refills | Status: DC

## 2022-11-15 NOTE — Hospital Course (Signed)
50 M with hx of drug abuse, 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 Found unconscious by family 01/13, they administered chest compressions and called EMS, pt to ED via EMS. Per report, powdered fentanyl and pill fentanyl found in bathroom with patient. EMS administered narcan. Pt alert, agitated in ED.  01/13: significant respiratory failure requiring intubation and ICU admission.  He is very tachypneic and barely oxygenating with a nonrebreather on arrival.  X-ray with right-sided infiltrates, uncertain if this represents aspiration, pneumonia, edema, or emboli.  Easily intubated and saturating well on the ventilator. Abx for cellulitis and pneumonia coverage. UDS (+)cocaine, amphetamine  01/14: underwent bronchoscopy, removal of apparent food material in R and L lungs, mucopurulent plugging, bronchial washings sent for microbiology  01/15- patient extubated to nasal canula and is conversive off all infusions.  Will order CIWA protocol and optimize for TRH tranfer. Off levophed in AM.  01/16: TRH pickup     Consultants:  PCCU  Procedures: Bronchoscopy 11/13/22      ASSESSMENT & PLAN:   Principal Problem:   Drug overdose  PULM/ID:  Acute Hypoxic Respiratory Failure R Aspiration Pneumonia Aspiration food material, s/p bronchoscopy GPCs+ bronchial washings continue on Unasyn abx pending final cultures  continue respiratory support with suctioning and nasal canula post extubation   GI/Nutrition: GI PROPHYLAXIS as indicated DIET-->TF's as tolerated Constipation protocol as indicated   ENDO - ICU hypoglycemic\Hyperglycemia protocol -check FSBS per protocol     ELECTROLYTES -follow labs as needed -replace as needed -pharmacy consultation         DVT prophylaxis: SCD Pertinent IV fluids/nutrition: *** Central lines / invasive devices: ***  Code Status: FULL CODE   Current Admission Status: inpatient   TOC needs / Dispo plan: none at this time  Barriers to discharge /  significant pending items: ***

## 2022-11-15 NOTE — Discharge Summary (Signed)
Physician Discharge Summary   Patient: Edwin [name] MRN: [id number]  DOB: 1986/01/19   Admit:     Date of Admission: 11/12/2022 Admitted from: over   Discharge: Date of discharge: 11/15/22 Disposition: Home Condition at discharge: good  CODE STATUS: FULL CODE     Discharge Physician: Sunnie Nielsen, DO [facility]     PCP: Pcp, No  Recommendations for Outpatient Follow-up:  Follow up with PCP ASAP Needs to establish to maintain treatment for opioid use d/o  Please follow up on the following pending results: final bronchial cultures  PCP AND OTHER OUTPATIENT PROVIDERS: SEE BELOW FOR SPECIFIC DISCHARGE INSTRUCTIONS PRINTED FOR PATIENT IN ADDITION TO GENERIC AVS PATIENT INFO     Discharge Instructions     Diet - low sodium heart healthy   Complete by: As directed    Discharge instructions   Complete by: As directed    FOLLOW UP TO ESTABLISH WITH PRIMARY CARE / OPIATE DEPENDENCE CLINIC TO MAINTAIN SUBOXONE PRESCRIPTION AND TREAT OPIOID USE DISORDER   Increase activity slowly   Complete by: As directed          Discharge Diagnoses: Principal Problem:   Drug overdose       Hospital Course: 37 M with hx of drug abuse, 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 Found unconscious by family 01/13, they administered chest compressions and called EMS, pt to ED via EMS. Per report, powdered fentanyl and pill fentanyl found in bathroom with patient. EMS administered narcan. Pt alert, agitated in ED.  01/13: significant respiratory failure requiring intubation and ICU admission.  He is very tachypneic and barely oxygenating with a nonrebreather on arrival.  X-ray with right-sided infiltrates, uncertain if this represents aspiration, pneumonia, edema, or emboli.  Easily intubated and saturating well on the ventilator. Abx for cellulitis and pneumonia coverage. UDS (+)cocaine, amphetamine  01/14: underwent bronchoscopy, removal of apparent food material in R and L  lungs, mucopurulent plugging, bronchial washings sent for microbiology  01/15- patient extubated to nasal canula and is conversive off all infusions.  Will order CIWA protocol and optimize for TRH tranfer. Off levophed in AM.  01/16: TRH pickup. Off O2, eating, ambulating. Pt wants to go home. He would like something to help him stay off drugs - suboxone Rx written and pt advised to establish w/ PCP ASAP   Consultants:  PCCU  Procedures: Bronchoscopy 11/13/22      ASSESSMENT & PLAN:   Principal Problem:   Drug overdose  Acute Hypoxic Respiratory Failure R Aspiration Pneumonia Aspiration food material, s/p bronchoscopy GPCs+ bronchial washings continue on augmentin outpatient    Opiate use disorder Suboxone Rx provided Needs to estbalish ASAP w/ PCP              Discharge Instructions  Allergies as of 11/15/2022       Reactions   Mucomyst [acetylcysteine] Other (See Comments)   unknown   Vicodin [hydrocodone-acetaminophen] Other (See Comments)   Reaction:  GI upset         Medication List     TAKE these medications    amoxicillin-clavulanate 500-125 MG tablet Commonly known as: AUGMENTIN Take 1 tablet by mouth 2 (two) times daily.   Buprenorphine HCl-Naloxone HCl 8-2 MG Film Place 1 Film under the tongue daily. Start on day following the initial withdrawal dosing - FURTHER REFILLS NEED TO BE DONE BY YOUR CLINIC   Buprenorphine HCl-Naloxone HCl 2-0.5 MG Film Place 1 Film under the tongue 4 (four) times  daily as needed (withdrawal symptoms on first 1 to 2 days of withdrawal).   ondansetron 8 MG disintegrating tablet Commonly known as: ZOFRAN-ODT Take 1 tablet (8 mg total) by mouth every 8 (eight) hours as needed for nausea or vomiting.       Of note, I neglected to print Rx for abx, I attempted to call patient at number listed, it was not in service, sent e-Rx to [facility], was able to reach his father to give him the message that a Rx was sent to  [facility]    Allergies  Allergen Reactions   Mucomyst [Acetylcysteine] Other (See Comments)    unknown   Vicodin [Hydrocodone-Acetaminophen] Other (See Comments)    Reaction:  GI upset      Subjective: pt feeling well this afternoon and wants to go home, no concerns or complaints. Would like resources to stay off drugs.    Discharge Exam: BP 109/74   Pulse 74   Temp 98.9 F (37.2 C) (Oral)   Resp (!) 23   Ht 5\' 10"  (1.778 m)   Wt 89.2 kg   SpO2 94%   BMI 28.22 kg/m  General: Pt is alert, awake, not in acute distress Cardiovascular: RRR, S1/S2 +, no rubs, no gallops Respiratory: CTA bilaterally, no wheezing, no rhonchi Abdominal: Soft, NT, ND, bowel sounds + Extremities: no edema, no cyanosis     The results of significant diagnostics from this hospitalization (including imaging, microbiology, ancillary and laboratory) are listed below for reference.     Microbiology: Recent Results (from the past 240 hour(s))  Blood culture (routine x 2)     Status: None (Preliminary result)   Collection Time: 11/12/22  7:53 AM   Specimen: BLOOD  Result Value Ref Range Status   Specimen Description BLOOD LEFT ANTECUBITAL  Final   Special Requests   Final    BOTTLES DRAWN AEROBIC AND ANAEROBIC Blood Culture adequate volume   Culture   Final    NO GROWTH 3 DAYS Performed at [facility], [address], [place], [place]    Report Status PENDING  Incomplete  Blood culture (routine x 2)     Status: None (Preliminary result)   Collection Time: 11/12/22  7:54 AM   Specimen: BLOOD  Result Value Ref Range Status   Specimen Description BLOOD RIGHT ANTECUBITAL  Final   Special Requests   Final    BOTTLES DRAWN AEROBIC AND ANAEROBIC Blood Culture adequate volume   Culture   Final    NO GROWTH 3 DAYS Performed at [facility], [address], [place], [place]    Report Status PENDING  Incomplete  MRSA Next Gen by PCR, Nasal     Status: None    Collection Time: 11/12/22  9:22 AM   Specimen: Nasal Mucosa; Nasal Swab  Result Value Ref Range Status   MRSA by PCR Next Gen NOT DETECTED NOT DETECTED Final    Comment: (NOTE) The GeneXpert MRSA Assay (FDA approved for NASAL specimens only), is one component of a comprehensive MRSA colonization surveillance program. It is not intended to diagnose MRSA infection nor to guide or monitor treatment for MRSA infections. Test performance is not FDA approved in patients less than 45 years old. Performed at [facility], [address] [place], [place]   Culture, Respiratory w Gram Stain     Status: None (Preliminary result)   Collection Time: 11/13/22 10:59 AM   Specimen: Bronchial Wash; Respiratory  Result Value  Ref Range Status   Specimen Description   Final    BRONCHIAL WASHINGS Performed at [facility], [address] [place], [place] [address]    Special Requests   Final    NONE Performed at [facility], [place], [place] [address]    Gram Stain   Final    RARE GRAM POSITIVE COCCI IN SINGLES IN PAIRS NO WBC SEEN    Culture   Final    CULTURE REINCUBATED FOR BETTER GROWTH Performed at [facility], [place] [address] [place],  [address]    Report Status PENDING  Incomplete     Labs: BNP (last 3 results) Recent Labs    11/12/22 1722  BNP 36.1   Basic Metabolic Panel: Recent Labs  Lab 11/12/22 0554 11/13/22 0355 11/14/22 0422 11/15/22 0437  NA 137 138 138 136  K 3.8 3.9 3.5 3.3*  CL 101 103 104 104  CO2 26 28 31 27   GLUCOSE 141* 133* 91 87  BUN 14 11 12 9   CREATININE 0.92 0.82 0.67 0.60*  CALCIUM 8.9 8.1* 7.8* 7.6*  MG 2.1 1.9 1.9 1.7  PHOS  --  3.1 2.4* 3.2   Liver Function Tests: Recent Labs  Lab 11/12/22 0554  AST 68*  ALT 62*  ALKPHOS 62  BILITOT 0.5  PROT 8.3*  ALBUMIN 3.9   No results for input(s): "LIPASE", "AMYLASE" in the last 168 hours. No results for input(s): "AMMONIA"  in the last 168 hours. CBC: Recent Labs  Lab 11/12/22 0554 11/13/22 0355 11/15/22 0437  WBC 7.1 14.0* 9.1  NEUTROABS 3.8  --  5.4  HGB 15.1 12.9* 11.1*  HCT 45.7 38.2* 32.5*  MCV 92.7 91.2 91.0  PLT 302 227 204   Cardiac Enzymes: Recent Labs  Lab 11/12/22 0554  CKTOTAL 289   BNP: Invalid input(s): "POCBNP" CBG: Recent Labs  Lab 11/14/22 1916 11/14/22 2352 11/15/22 0403 11/15/22 0827 11/15/22 1118  GLUCAP 135* 133* 81 91 152*   D-Dimer No results for input(s): "DDIMER" in the last 72 hours. Hgb A1c No results for input(s): "HGBA1C" in the last 72 hours. Lipid Profile No results for input(s): "CHOL", "HDL", "LDLCALC", "TRIG", "CHOLHDL", "LDLDIRECT" in the last 72 hours. Thyroid function studies No results for input(s): "TSH", "T4TOTAL", "T3FREE", "THYROIDAB" in the last 72 hours.  Invalid input(s): "FREET3" Anemia work up No results for input(s): "VITAMINB12", "FOLATE", "FERRITIN", "TIBC", "IRON", "RETICCTPCT" in the last 72 hours. Urinalysis    Component Value Date/Time   COLORURINE YELLOW (A) 11/12/2022 0753   APPEARANCEUR HAZY (A) 11/12/2022 0753   APPEARANCEUR Clear 03/22/2014 1052   LABSPEC 1.011 11/12/2022 0753   LABSPEC 1.026 03/22/2014 1052   PHURINE 6.0 11/12/2022 0753   GLUCOSEU NEGATIVE 11/12/2022 0753   GLUCOSEU Negative 03/22/2014 1052   HGBUR NEGATIVE 11/12/2022 0753   BILIRUBINUR NEGATIVE 11/12/2022 0753   BILIRUBINUR Negative 03/22/2014 1052   KETONESUR NEGATIVE 11/12/2022 0753   PROTEINUR NEGATIVE 11/12/2022 0753   UROBILINOGEN 1.0 02/28/2009 2307   NITRITE NEGATIVE 11/12/2022 0753   LEUKOCYTESUR NEGATIVE 11/12/2022 0753   LEUKOCYTESUR Negative 03/22/2014 1052   Sepsis Labs Recent Labs  Lab 11/12/22 0554 11/13/22 0355 11/15/22 0437  WBC 7.1 14.0* 9.1   Microbiology Recent Results (from the past 240 hour(s))  Blood culture (routine x 2)     Status: None (Preliminary result)   Collection Time: 11/12/22  7:53 AM   Specimen:  BLOOD  Result Value Ref Range Status   Specimen  Description BLOOD LEFT ANTECUBITAL  Final   Special Requests   Final    BOTTLES DRAWN AEROBIC AND ANAEROBIC Blood Culture adequate volume   Culture   Final    NO GROWTH 3 DAYS Performed at [facility], [address] [place], [place] [address]    Report Status PENDING  Incomplete  Blood culture (routine x 2)     Status: None (Preliminary result)   Collection Time: 11/12/22  7:54 AM   Specimen: BLOOD  Result Value Ref Range Status   Specimen Description BLOOD RIGHT ANTECUBITAL  Final   Special Requests   Final    BOTTLES DRAWN AEROBIC AND ANAEROBIC Blood Culture adequate volume   Culture   Final    NO GROWTH 3 DAYS Performed at [facility], [address], [place], [place] [address]    Report Status PENDING  Incomplete  MRSA Next Gen by PCR, Nasal     Status: None   Collection Time: 11/12/22  9:22 AM   Specimen: Nasal Mucosa; Nasal Swab  Result Value Ref Range Status   MRSA by PCR Next Gen NOT DETECTED NOT DETECTED Final    Comment: (NOTE) The GeneXpert MRSA Assay (FDA approved for NASAL specimens only), is one component of a comprehensive MRSA colonization surveillance program. It is not intended to diagnose MRSA infection nor to guide or monitor treatment for MRSA infections. Test performance is not FDA approved in patients less than 81 years old. Performed at [facility], [address] [place], [place] [address]   Culture, Respiratory w Gram Stain     Status: None (Preliminary result)   Collection Time: 11/13/22 10:59 AM   Specimen: Bronchial Wash; Respiratory  Result Value Ref Range Status   Specimen Description   Final    BRONCHIAL WASHINGS Performed at [facility], [address], [place], [place] [address]    Special Requests   Final    NONE Performed at [facility], [place] [place],  [address]    Gram Stain   Final    RARE GRAM POSITIVE COCCI  IN SINGLES IN PAIRS NO WBC SEEN    Culture   Final    CULTURE REINCUBATED FOR BETTER GROWTH Performed at [facility], [place] [address], [place],  [address]    Report Status PENDING  Incomplete   Imaging CT Angio Chest Pulmonary Embolism (PE) W or WO Contrast  Addendum Date: 11/13/2022   ADDENDUM REPORT: 11/13/2022 20:01 ADDENDUM: In the impression section the sentence should read: Considerable bilateral infiltrates as described worse on the right than the left. Electronically Signed   By: Inez Catalina M.D.   On: 11/13/2022 20:01   Result Date: 11/13/2022 CLINICAL DATA:  Possible drug overdose with chest pain and shortness of breath, initial encounter EXAM: CT ANGIOGRAPHY CHEST WITH CONTRAST TECHNIQUE: Multidetector CT imaging of the chest was performed using the standard protocol during bolus administration of intravenous contrast. Multiplanar CT image reconstructions and MIPs were obtained to evaluate the vascular anatomy. RADIATION DOSE REDUCTION: This exam was performed according to the departmental dose-optimization program which includes automated exposure control, adjustment of the mA and/or kV according to patient size and/or use of iterative reconstruction technique. CONTRAST:  74mL OMNIPAQUE IOHEXOL 350 MG/ML SOLN COMPARISON:  Chest x-ray from earlier in the same day. FINDINGS: Cardiovascular: Thoracic aorta shows no aneurysmal dilatation. No evidence of dissection is seen. No cardiac enlargement is noted. No significant coronary calcifications are noted. The pulmonary  artery shows a normal branching pattern bilaterally. No evidence of pulmonary embolism is seen. Left subclavian central venous line is noted. Mediastinum/Nodes: Thoracic inlet is within normal limits. Endotracheal tube is noted in satisfactory position. Gastric catheter extends into the stomach. Esophagus as visualized is within normal limits. Some reactive hilar lymph nodes are noted bilaterally. Lungs/Pleura: Lungs  show diffuse bilateral infiltrate throughout the right upper, middle and lower lobes with significant consolidation identified in the right lower lobe. Similar but lesser findings are noted within the left lung with consolidation in the left lower lobe medially. No sizable parenchymal nodule is seen. Some inspissated material is noted within the lower lobe bronchial tree bilaterally possibly related to aspiration. Upper Abdomen: Visualized upper abdomen shows no acute abnormality. Musculoskeletal: Multiple left-sided rib fractures are noted anteriorly to include the third through sixth ribs. Healed rib fractures are noted on the right consistent with prior trauma. Review of the MIP images confirms the above findings. IMPRESSION: No evidence of pulmonary emboli. Cerebral bilateral infiltrates as described worse on the right than the left. Inspissated material is noted in the lower lobe bronchial tree likely related to aspiration. Multiple left-sided rib fractures are noted consistent with the recent CPR. No associated pneumothorax is noted. Electronically Signed: By: Alcide CleverMark  Lukens M.D. On: 11/12/2022 19:12   US Venous Img Lower Unilateral Right  Result Date: 11/12/2022 CLINICAL DATA:  Right lower extremity swelling and redness for the past 4 days EXAM: RIGHT LOWER EXTREMITY VENOUS DOPPLER ULTRASOUND TECHNIQUE: Gray-scale sonography with compression, as well as color and duplex ultrasound, were performed to evaluate the deep venous system(s) from the level of the common femoral vein through the popliteal and proximal calf veins. COMPARISON:  None Available. FINDINGS: VENOUS Normal compressibility of the common femoral, superficial femoral, and popliteal veins, as well as the visualized calf veins. Visualized portions of profunda femoral vein and great saphenous vein unremarkable. No filling defects to suggest DVT on grayscale or color Doppler imaging. Doppler waveforms show normal direction of venous flow, normal  respiratory plasticity and response to augmentation. Limited views of the contralateral common femoral vein are unremarkable. OTHER Prominent right groin lymph node measures 3.1 x 2.7 x 1.0 cm. The fatty hilum is preserved. Limitations: none IMPRESSION: 1. No evidence of deep venous thrombosis. 2. Prominent right superficial inguinal lymph node is likely reactive to underlying cellulitis or other infectious/inflammatory process. Electronically Signed   By: Malachy MoanHeath  McCullough M.D.   On: 11/12/2022 07:57   DG Chest Portable 1 View  Result Date: 11/12/2022 CLINICAL DATA:  37 year old male intubated, lines and tubes placed. Fentanyl overdose, status post CPR. EXAM: PORTABLE CHEST 1 VIEW COMPARISON:  Portable chest 0600 hours today. FINDINGS: Portable AP supine view at 0641 hours. Endotracheal tube tip in satisfactory position between the clavicles and carina. Left subclavian central line placed, tip at the cavoatrial junction level. Enteric tube placed into the stomach, terminates at the gastric antrum. Stable to slightly lower lung volumes. Mediastinal contours remain within normal limits. Coarse and confluent right greater than left pulmonary opacity not significantly changed. No pneumothorax identified. Right lateral costophrenic angle not included now. Mild gaseous distension of the stomach. Otherwise negative visible bowel gas. IMPRESSION: 1. Satisfactory ET tube, enteric tube, left subclavian central line. 2. Stable ventilation with coarse and confluent right greater than left pulmonary opacity, possibly pulmonary edema in this setting. Aspiration also a consideration. Electronically Signed   By: Odessa FlemingH  Hall M.D.   On: 11/12/2022 06:58   DG Chest Portable  1 View  Result Date: 11/12/2022 CLINICAL DATA:  Shortness of breath, overdose, arrest. EXAM: PORTABLE CHEST 1 VIEW COMPARISON:  Portable chest 12/01/2021 FINDINGS: There is mild cardiomegaly. Central vessels are mildly prominent. There is mild interstitial  edema in the base of both lungs, trace pleural effusions. The right there is diffuse patchy airspace disease sparing only the lateral basal area and could represent asymmetric edema, pneumonia/aspiration or combination. The left lung is clear of focal infiltrate. The mediastinum is normally outlined. No acute osseous findings. IMPRESSION: 1. Cardiomegaly with mild interstitial edema and trace pleural effusions. 2. Diffuse patchy airspace disease in the right lung sparing only the lateral basal area and could represent asymmetric edema, pneumonia/aspiration or combination. 3. Clinical correlation and radiographic follow-up recommended. Electronically Signed   By: Almira Bar M.D.   On: 11/12/2022 06:42      Time coordinating discharge: over 30 minutes  SIGNED:  Sunnie Nielsen DO [facility]

## 2022-11-15 NOTE — Progress Notes (Signed)
Patient being discharged home. Father at bedside to provide transportation home for patient. All discharge instructions reviewed with patient and questions answered within nursing scope of practice. Prescriptions printed by MD I patient chart and provided to patient. Patient provided disposable scrubs to wear at time of discharge.CCMD notified patient being disconnected from monitoring d/t discharge. All patient belongings at bedside packed by and sent with patient. Patient provided with support contact by SW.

## 2022-11-15 NOTE — Consult Note (Signed)
PHARMACY CONSULT NOTE - ELECTROLYTES  Pharmacy Consult for Electrolyte Monitoring and Replacement   Recent Labs: Potassium (mmol/L)  Date Value  11/15/2022 3.3 (L)  03/24/2014 3.7   Magnesium (mg/dL)  Date Value  11/15/2022 1.7  02/19/2014 2.2   Calcium (mg/dL)  Date Value  11/15/2022 7.6 (L)   Calcium, Total (mg/dL)  Date Value  03/24/2014 9.0   Albumin (g/dL)  Date Value  11/12/2022 3.9  03/22/2014 4.2   Phosphorus (mg/dL)  Date Value  11/15/2022 3.2  02/20/2014 4.5   Sodium (mmol/L)  Date Value  11/15/2022 136  03/24/2014 138   Corrected Ca: 9.0 mg/dL  Assessment  Edwin [name] is a 37 y.o. male presenting with overdose. PMH significant for PSA, MDD, NSTEMI. Pharmacy has been consulted to monitor and replace electrolytes.  Diet: Regular, soft MIVF: LR @ 50 mL/hr  Goal of Therapy: Electrolytes WNL  Plan:  Potassium: 3.5 >> 3.3, give KCl 40 mEq PO x1 Magnesium: 1.9 >> 1.7, no replacement needed Phosphorus: 2.4 >> 3.2, no replacement needed Check BMP, Mg, Phos with AM labs Pharmacy will sign off and continue to monitor peripherally  Thank you for allowing pharmacy to be a part of this patient's care.  Gretel Acre, PharmD PGY1 Pharmacy Resident 11/15/2022 7:08 AM

## 2022-11-15 NOTE — TOC Initial Note (Signed)
Transition of Care [facility]) - Initial/Assessment Note    Patient Details  Name: Edwin [name] MRN: [id number] Date of Birth: 13-Jul-1986  Transition of [facility]) CM/SW Contact:    Shelbie Hutching, RN Phone Number: 11/15/2022, 2:21 PM  Clinical Narrative:                 [facility] consult for substance abuse resources and PCP.  Patient was a drug overdose, brought into emergency department where he was intubated.  Patient was extubated yesterday, off oxygen to room air today.  Patient medically cleared for discharge back home today. RNCM met with patient at the bedside, introduced self and explained role in DC planning.  Patient reports that he lives in [place] with a roommate.  He does not drive, he has friends or family for transportation.  He is not working anywhere.  He reports he has Medicaid.  Instructed patient that he needs to call Medicaid to request a PCP.  Patient being prescribed suboxone at DC, he says he has never been on suboxone before.  Told the patient that he needs to follow up at [facility] this week and gave him the information.  They do assessments on MWF from 8-330, he should get someone to take him tomorrow.  Patient verbalizes understanding.  He denies questions or concerns.    Expected Discharge Plan: Home/Self Care Barriers to Discharge: Barriers Resolved   Patient Goals and CMS Choice Patient states their goals for this hospitalization and ongoing recovery are:: to go home          Expected Discharge Plan and Services   Discharge Planning Services: CM Consult, Other - See comment (SA resources)   Living arrangements for the past 2 months: Single Family Home Expected Discharge Date: 11/15/22               DME Arranged: N/A DME Agency: NA       HH Arranged: NA [place] Agency: NA        Prior Living Arrangements/Services Living arrangements for the past 2 months: Single Family Home Lives with:: Roommate Patient language and need for interpreter reviewed::  Yes Do you feel safe going back to the place where you live?: Yes      Need for Family Participation in Patient Care: Yes (Comment) Care giver support system in place?: Yes (comment)   Criminal Activity/Legal Involvement Pertinent to Current Situation/Hospitalization: No - Comment as needed  Activities of Daily Living      Permission Sought/Granted   Permission granted to share information with : No              Emotional Assessment Appearance:: Appears stated age Attitude/Demeanor/Rapport: Guarded Affect (typically observed): Accepting, Quiet Orientation: : Oriented to Self, Oriented to Place, Oriented to  Time, Oriented to Situation Alcohol / Substance Use: Illicit Drugs Psych Involvement: No (comment)  Admission diagnosis:  Drug overdose [T50.901A] Acute respiratory failure with hypoxia (HCC) [J96.01] Opiate overdose, undetermined intent, initial encounter [facility]) [T40.604A] Patient Active Problem List   Diagnosis Date Noted   Drug overdose 11/12/2022   Chronic hepatitis C without hepatic coma (HCC)    Acute hepatitis    Acute renal failure (HCC)    Elevated LFTs    Hypermagnesemia    Infection    Cocaine poisoning ([place]) 12/02/2021   Rhabdomyolysis 12/02/2021   ATN (acute tubular necrosis) ([place]) 12/02/2021   Hyperkalemia 12/02/2021   Myositis 40/05/6760   Acute metabolic encephalopathy 95/07/3266   Non-ST elevation (NSTEMI) myocardial  infarction [facility])    Cocaine abuse ([place])    Suicidal thoughts    Chest pain    Cannabis abuse    Major depressive disorder, recurrent episode, severe ([place]) 08/11/2018   Methamphetamine abuse ([place])    Cannabis use disorder, moderate, dependence ([place])    Tobacco use disorder 01/28/2016   Major depressive disorder, recurrent severe without psychotic features ([place])    Uncomplicated opioid dependence ([place])    Opiate or related narcotic overdose ([place]) 11/28/2015   Polysubstance abuse ([place])    PCP:  Pcp, No Pharmacy:   [facility] [address] [place] [address] Phone: [phone] Fax: [phone]     Social Determinants of Health (SDOH) Social History: SDOH Screenings   Alcohol Screen: Low Risk  (08/11/2018)  Tobacco Use: High Risk (12/10/2021)   SDOH Interventions:     Readmission Risk Interventions     No data to display

## 2022-11-16 LAB — LEGIONELLA PNEUMOPHILA SEROGP 1 UR AG: L. pneumophila Serogp 1 Ur Ag: NEGATIVE

## 2022-11-17 LAB — CULTURE, BLOOD (ROUTINE X 2)
Culture: NO GROWTH
Culture: NO GROWTH
Special Requests: ADEQUATE
Special Requests: ADEQUATE

## 2022-11-17 LAB — CULTURE, RESPIRATORY W GRAM STAIN

## 2022-12-12 ENCOUNTER — Other Ambulatory Visit (HOSPITAL_COMMUNITY)
Admission: RE | Admit: 2022-12-12 | Discharge: 2022-12-12 | Disposition: A | Discharge status: Home / Self Care | Payer: Medicaid Other | Source: Ambulatory Visit | Attending: Physician Assistant | Admitting: Physician Assistant

## 2022-12-12 ENCOUNTER — Ambulatory Visit: Payer: Medicaid Other | Admitting: Physician Assistant

## 2022-12-12 ENCOUNTER — Encounter: Payer: Self-pay | Admitting: Physician Assistant

## 2022-12-12 VITALS — BP 123/74 | HR 93 | Ht 68.0 in | Wt 187.0 lb

## 2022-12-12 DIAGNOSIS — F1410 Cocaine abuse, uncomplicated: Secondary | ICD-10-CM

## 2022-12-12 DIAGNOSIS — F17200 Nicotine dependence, unspecified, uncomplicated: Secondary | ICD-10-CM

## 2022-12-12 DIAGNOSIS — Z113 Encounter for screening for infections with a predominantly sexual mode of transmission: Secondary | ICD-10-CM | POA: Insufficient documentation

## 2022-12-12 DIAGNOSIS — F17210 Nicotine dependence, cigarettes, uncomplicated: Secondary | ICD-10-CM

## 2022-12-12 DIAGNOSIS — F1510 Other stimulant abuse, uncomplicated: Secondary | ICD-10-CM

## 2022-12-12 DIAGNOSIS — F1210 Cannabis abuse, uncomplicated: Secondary | ICD-10-CM

## 2022-12-12 DIAGNOSIS — E876 Hypokalemia: Secondary | ICD-10-CM | POA: Diagnosis not present

## 2022-12-12 DIAGNOSIS — F332 Major depressive disorder, recurrent severe without psychotic features: Secondary | ICD-10-CM | POA: Diagnosis not present

## 2022-12-12 DIAGNOSIS — B182 Chronic viral hepatitis C: Secondary | ICD-10-CM

## 2022-12-12 DIAGNOSIS — G629 Polyneuropathy, unspecified: Secondary | ICD-10-CM

## 2022-12-12 DIAGNOSIS — F1120 Opioid dependence, uncomplicated: Secondary | ICD-10-CM

## 2022-12-12 DIAGNOSIS — R7989 Other specified abnormal findings of blood chemistry: Secondary | ICD-10-CM

## 2022-12-12 DIAGNOSIS — F1110 Opioid abuse, uncomplicated: Secondary | ICD-10-CM

## 2022-12-12 MED ORDER — GABAPENTIN 100 MG PO CAPS: 100.0000 mg | ORAL_CAPSULE | Freq: Two times a day (BID) | ORAL | 0 refills | Status: DC

## 2022-12-12 NOTE — Progress Notes (Signed)
New Patient Office Visit  Subjective    Patient ID: Edwin [name], male    DOB: 1985/12/19  Age: 37 y.o. MRN: [id number]  CC: No chief complaint on file.   HPI Edwin [name] presents to establish care [facility] jan 31 Has not decieded on aftercare yet  Nerve pain in left leg and foot - after having kidney failure  Gabapentin did help before    Mood - good -  Sleep  - is good  Worse at night - feels like on fire - worse in left than right  Hcv -  Is not taking [facility] Course: 20 M with hx of drug abuse, 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 Found unconscious by family 01/13, they administered chest compressions and called EMS, pt to ED via EMS. Per report, powdered fentanyl and pill fentanyl found in bathroom with patient. EMS administered narcan. Pt alert, agitated in ED.  01/13: significant respiratory failure requiring intubation and ICU admission.  He is very tachypneic and barely oxygenating with a nonrebreather on arrival.  X-ray with right-sided infiltrates, uncertain if this represents aspiration, pneumonia, edema, or emboli.  Easily intubated and saturating well on the ventilator. Abx for cellulitis and pneumonia coverage. UDS (+)cocaine, amphetamine  01/14: underwent bronchoscopy, removal of apparent food material in R and L lungs, mucopurulent plugging, bronchial washings sent for microbiology  01/15- patient extubated to nasal canula and is conversive off all infusions.  Will order CIWA protocol and optimize for TRH tranfer. Off levophed in AM.  01/16: TRH pickup. Off O2, eating, ambulating. Pt wants to go home. He would like something to help him stay off drugs - suboxone Rx written and pt advised to establish w/ PCP ASAP    ASSESSMENT & PLAN:   Principal Problem:   Drug overdose   Acute Hypoxic Respiratory Failure R Aspiration Pneumonia Aspiration food material, s/p bronchoscopy GPCs+ bronchial washings continue on augmentin outpatient     Opiate use disorder Suboxone Rx provided Needs to estbalish ASAP w/ PCP         Outpatient Encounter Medications as of 12/12/2022  Medication Sig  . amoxicillin-clavulanate (AUGMENTIN) 500-125 MG tablet Take 1 tablet by mouth 2 (two) times daily.  . Buprenorphine HCl-Naloxone HCl 2-0.5 MG FILM Place 1 Film under the tongue 4 (four) times daily as needed (withdrawal symptoms on first 1 to 2 days of withdrawal).  . Buprenorphine HCl-Naloxone HCl 8-2 MG FILM Place 1 Film under the tongue daily. Start on day following the initial withdrawal dosing - FURTHER REFILLS NEED TO BE DONE BY YOUR CLINIC  . ondansetron (ZOFRAN-ODT) 8 MG disintegrating tablet Take 1 tablet (8 mg total) by mouth every 8 (eight) hours as needed for nausea or vomiting.   No facility-administered encounter medications on file as of 12/12/2022.    Past Medical History:  Diagnosis Date  . Drug overdose   . DVT (deep venous thrombosis) ([place])   . Hepatitis C     Past Surgical History:  Procedure Laterality Date  . DENTAL SURGERY      No family history on file.  Social History   Socioeconomic History  . Marital status: Single    Spouse name: Not on file  . Number of children: Not on file  . Years of education: Not on file  . Highest education level: Not on file  Occupational History  . Not on file  Tobacco Use  . Smoking status: Every Day  Packs/day: 1.00    Years: 20.00    Total pack years: 20.00    Types: Cigarettes  . Smokeless tobacco: Never  Substance and Sexual Activity  . Alcohol use: Yes    Comment: beer and liquor, binges  . Drug use: Yes    Frequency: 1.0 times per week    Types: Marijuana, IV, Cocaine, Methamphetamines    Comment: heroin  . Sexual activity: Not Currently  Other Topics Concern  . Not on file  Social History Narrative   ** Merged History Encounter **       Social Determinants of Health   Financial Resource Strain: Not on file  Food Insecurity: Not on file   Transportation Needs: Not on file  Physical Activity: Not on file  Stress: Not on file  Social Connections: Not on file  Intimate Partner Violence: Not on file    ROS      Objective    There were no vitals taken for this visit.  Physical Exam  {Labs (Optional):23779}    Assessment & Plan:   Problem List Items Addressed This Visit   None   No follow-ups on file.   Loraine Grip Mayers, PA-C

## 2022-12-12 NOTE — Patient Instructions (Signed)
You are going to start gabapentin 100 mg twice a day.  We will call you with your lab results when they are available.  I started a referral for you to be seen by infectious disease regarding hepatitis C.  You will follow-up with the mobile unit in 2 weeks.  Kennieth Rad, PA-C Physician Assistant [facility] [email or web address]   Neuropathic Pain Neuropathic pain is pain caused by damage to the nerves that are responsible for certain sensations in your body (sensory nerves). Neuropathic pain can make you more sensitive to pain. Even a minor sensation can feel very painful. This is usually a long-term (chronic) condition that can be difficult to treat. The type of pain differs from person to person. It may: Start suddenly (acute), or it may develop slowly and become chronic. Come and go as damaged nerves heal, or it may stay at the same level for years. Cause emotional distress, loss of sleep, and a lower quality of life. What are the causes? The most common cause of this condition is diabetes. Many other diseases and conditions can also cause neuropathic pain. Causes of neuropathic pain can be classified as: Toxic. This is caused by medicines and chemicals. The most common causes of toxic neuropathic pain is damage from medicines that kill cancer cells (chemotherapy) or alcohol abuse. Metabolic. This can be caused by: Diabetes. Lack of vitamins like B12. Traumatic. Any injury that cuts, crushes, or stretches a nerve can cause damage and pain. Compression-related. If a sensory nerve gets trapped or compressed for a long period of time, the blood supply to the nerve can be cut off. Vascular. Many blood vessel diseases can cause neuropathic pain by decreasing blood supply and oxygen to nerves. Autoimmune. This type of pain results from diseases in which the body's defense system (immune system) mistakenly attacks sensory nerves.  Examples of autoimmune diseases that can cause neuropathic pain include lupus and multiple sclerosis. Infectious. Many types of viral infections can damage sensory nerves and cause pain. Shingles infection is a common cause of this type of pain. Inherited. Neuropathic pain can be a symptom of many diseases that are passed down through families (genetic). What increases the risk? You are more likely to develop this condition if: You have diabetes. You smoke. You drink too much alcohol. You are taking certain medicines, including chemotherapy or medicines that treat immune system disorders. What are the signs or symptoms? The main symptom is pain. Neuropathic pain is often described as: Burning. Shock-like. Stinging. Hot or cold. Itching. How is this diagnosed? No single test can diagnose neuropathic pain. It is diagnosed based on: A physical exam and your symptoms. Your health care provider will ask you about your pain. You may be asked to use a pain scale to describe how bad your pain is. Tests. These may be done to see if you have a cause and location of any nerve damage. They include: Nerve conduction studies and electromyography to test how well nerve signals travel through your nerves and muscles (electrodiagnostic testing). Skin biopsy to evaluate for small fiber neuropathy. Imaging studies, such as: X-rays. CT scan. MRI. How is this treated? Treatment for neuropathic pain may change over time. You may need to try different treatment options or a combination of treatments. Some options include: Treating the underlying cause of the neuropathy, such as diabetes, kidney disease, or vitamin deficiencies. Stopping medicines that can cause neuropathy, such as chemotherapy. Medicine to relieve pain. Medicines may include: Prescription or over-the-counter pain  medicine. Anti-seizure medicine. Antidepressant medicines. Pain-relieving patches or creams that are applied to painful areas  of skin. A medicine to numb the area (local anesthetic), which can be injected as a nerve block. Transcutaneous nerve stimulation. This uses electrical currents to block painful nerve signals. The treatment is painless. Alternative treatments, such as: Acupuncture. Meditation. Massage. Occupational or physical therapy. Pain management programs. Counseling. Follow these instructions at home: Medicines  Take over-the-counter and prescription medicines only as told by your health care provider. Ask your health care provider if the medicine prescribed to you: Requires you to avoid driving or using machinery. Can cause constipation. You may need to take these actions to prevent or treat constipation: Drink enough fluid to keep your urine pale yellow. Take over-the-counter or prescription medicines. Eat foods that are high in fiber, such as beans, whole grains, and fresh fruits and vegetables. Limit foods that are high in fat and processed sugars, such as fried or sweet foods. Lifestyle  Have a good support system at home. Consider joining a chronic pain support group. Do not use any products that contain nicotine or tobacco. These products include cigarettes, chewing tobacco, and vaping devices, such as e-cigarettes. If you need help quitting, ask your health care provider. Do not drink alcohol. General instructions Learn as much as you can about your condition. Work closely with all your health care providers to find the treatment plan that works best for you. Ask your health care provider what activities are safe for you. Keep all follow-up visits. This is important. Contact a health care provider if: Your pain treatments are not working. You are having side effects from your medicines. You are struggling with tiredness (fatigue), mood changes, depression, or anxiety. Get help right away if: You have thoughts of hurting yourself. Get help right away if you feel like you may hurt  yourself or others, or have thoughts about taking your own life. Go to your nearest emergency room or: Call 911. Call the [place] at [phone] or 988. This is open 24 hours a day. Text the Crisis Text Line at 479-795-6913. Summary Neuropathic pain is pain caused by damage to the nerves that are responsible for certain sensations in your body (sensory nerves). Neuropathic pain may come and go as damaged nerves heal, or it may stay at the same level for years. Neuropathic pain is usually a long-term condition that can be difficult to treat. Consider joining a chronic pain support group. This information is not intended to replace advice given to you by your health care provider. Make sure you discuss any questions you have with your health care provider. Document Revised: 06/14/2021 Document Reviewed: 06/14/2021 Elsevier Patient Education  [place].

## 2022-12-13 DIAGNOSIS — F1110 Opioid abuse, uncomplicated: Secondary | ICD-10-CM | POA: Insufficient documentation

## 2022-12-13 DIAGNOSIS — G629 Polyneuropathy, unspecified: Secondary | ICD-10-CM | POA: Insufficient documentation

## 2022-12-13 LAB — COMP. METABOLIC PANEL (12)
AST: 29 IU/L (ref 0–40)
Albumin/Globulin Ratio: 1.7 (ref 1.2–2.2)
Albumin: 4.4 g/dL (ref 4.1–5.1)
Alkaline Phosphatase: 86 IU/L (ref 44–121)
BUN/Creatinine Ratio: 14 (ref 9–20)
BUN: 12 mg/dL (ref 6–20)
Bilirubin Total: 0.3 mg/dL (ref 0.0–1.2)
Calcium: 8.9 mg/dL (ref 8.7–10.2)
Chloride: 107 mmol/L — ABNORMAL HIGH (ref 96–106)
Creatinine, Ser: 0.87 mg/dL (ref 0.76–1.27)
Globulin, Total: 2.6 g/dL (ref 1.5–4.5)
Glucose: 118 mg/dL — ABNORMAL HIGH (ref 70–99)
Potassium: 4.3 mmol/L (ref 3.5–5.2)
Sodium: 142 mmol/L (ref 134–144)
Total Protein: 7 g/dL (ref 6.0–8.5)
eGFR: 114 mL/min/{1.73_m2} (ref >59)

## 2022-12-13 LAB — RPR: RPR Ser Ql: NONREACTIVE

## 2022-12-13 LAB — HIV ANTIBODY (ROUTINE TESTING W REFLEX): HIV Screen 4th Generation wRfx: NONREACTIVE

## 2022-12-14 LAB — URINE CYTOLOGY ANCILLARY ONLY
Chlamydia: NEGATIVE
Comment: NEGATIVE
Comment: NEGATIVE
Comment: NORMAL
Neisseria Gonorrhea: NEGATIVE
Trichomonas: NEGATIVE

## 2022-12-28 ENCOUNTER — Telehealth: Payer: Medicaid Other | Admitting: Physician Assistant

## 2022-12-28 DIAGNOSIS — F17210 Nicotine dependence, cigarettes, uncomplicated: Secondary | ICD-10-CM

## 2022-12-28 DIAGNOSIS — B182 Chronic viral hepatitis C: Secondary | ICD-10-CM

## 2022-12-28 DIAGNOSIS — G629 Polyneuropathy, unspecified: Secondary | ICD-10-CM | POA: Diagnosis not present

## 2022-12-28 DIAGNOSIS — F1510 Other stimulant abuse, uncomplicated: Secondary | ICD-10-CM

## 2022-12-28 DIAGNOSIS — R7989 Other specified abnormal findings of blood chemistry: Secondary | ICD-10-CM

## 2022-12-28 DIAGNOSIS — F1410 Cocaine abuse, uncomplicated: Secondary | ICD-10-CM | POA: Diagnosis not present

## 2022-12-28 DIAGNOSIS — F17200 Nicotine dependence, unspecified, uncomplicated: Secondary | ICD-10-CM

## 2022-12-28 DIAGNOSIS — E876 Hypokalemia: Secondary | ICD-10-CM

## 2022-12-28 DIAGNOSIS — F332 Major depressive disorder, recurrent severe without psychotic features: Secondary | ICD-10-CM

## 2022-12-28 DIAGNOSIS — F1210 Cannabis abuse, uncomplicated: Secondary | ICD-10-CM

## 2022-12-28 MED ORDER — GABAPENTIN 100 MG PO CAPS: 100.0000 mg | ORAL_CAPSULE | Freq: Three times a day (TID) | ORAL | 0 refills | Status: AC

## 2022-12-28 NOTE — Progress Notes (Unsigned)
Established Patient Office Visit  Subjective   Patient ID: Edwin [name], male    DOB: 1986/03/17  Age: 37 y.o. MRN: [id number]  No chief complaint on file.  Virtual Visit via Video Note  I connected with Edwin [name] on 12/28/22 at  9:40 AM EST by a video enabled telemedicine application and verified that I am speaking with the correct person using two identifiers.  Location: Patient: *** Provider: ***   I discussed the limitations of evaluation and management by telemedicine and the availability of in person appointments. The patient expressed understanding and agreed to proceed.  History of Present Illness:    Observations/Objective:    [facility] After care - IOP at [facility] in [place], Friday morning - leaves tomorrow     States that he continues to have pain in his left leg and foot as well as some in his right foot.  States that he did experience kidney failure after overdosing in February 2023.  States that he has previously prescribed gabapentin with describes the pain as burning, worse at night, often feels like it is on fire, worse in left than right.  Denies numbness or tingling.    Feels like it wears off - middle toes relax when  - Goimg to dad and brothers -  Lives in [place]     States that he has previously been diagnosed with hepatitis C, but did not follow-up to have treatment.   Requests screening for sexually transmitted diseases due to unprotected intercourse, denies known exposure or symptoms.    Major depressive disorder, recurrent severe without psychotic features ([place]) Currently in treatment program, does not want to use any medication   2. Neuropathy Trial gabapentin 100 mg twice a day.  Patient education given on supportive care.  Red flags given for prompt reevaluation.   Patient to follow-up with mobile unit in 2 weeks - gabapentin (NEURONTIN) 100 MG capsule; Take 1 capsule (100 mg total) by mouth 2 (two) times daily.   Dispense: 60 capsule; Refill: 0   3. Screen for STD (sexually transmitted disease)   - HIV antibody (with reflex) - [facility] - Urine cytology ancillary only   4. Hypokalemia   - Comp. Metabolic Panel (12)   5. Hypocalcemia   - Comp. Metabolic Panel (12)   6. Chronic hepatitis C without hepatic coma ([place])   - Ambulatory referral to Infectious Disease   7. Elevated LFTs     8. Cocaine abuse ([place]) Currently in substance abuse treatment program   9. Tobacco use disorder     10. Cannabis abuse     11. Methamphetamine abuse ([place])     12. Opioid abuse ([place])     13. Fentanyl use disorder, severe (HCC)       Past Medical History:  Diagnosis Date   Drug overdose    DVT (deep venous thrombosis) (HCC)    Hepatitis C    Social History   Socioeconomic History   Marital status: Single    Spouse name: Not on file   Number of children: Not on file   Years of education: Not on file   Highest education level: Not on file  Occupational History   Not on file  Tobacco Use   Smoking status: Every Day    Packs/day: 1.00    Years: 20.00    Total pack years: 20.00    Types: Cigarettes   Smokeless tobacco: Never  Substance and Sexual Activity   Alcohol use: Yes  Comment: beer and liquor, binges   Drug use: Yes    Frequency: 1.0 times per week    Types: Marijuana, IV, Cocaine, Methamphetamines    Comment: heroin   Sexual activity: Not Currently  Other Topics Concern   Not on file  Social History Narrative   ** Merged History Encounter **       Social Determinants of Health   Financial Resource Strain: Not on file  Food Insecurity: Not on file  Transportation Needs: Not on file  Physical Activity: Not on file  Stress: Not on file  Social Connections: Not on file  Intimate Partner Violence: Not on file   No family history on file. Allergies  Allergen Reactions   Mucomyst [Acetylcysteine] Other (See Comments)    unknown   Vicodin  [Hydrocodone-Acetaminophen] Other (See Comments)    Reaction:  GI upset     ROS      Assessment & Plan:   Problem List Items Addressed This Visit       Digestive   Chronic hepatitis C without hepatic coma (HCC)     Nervous and Auditory   Neuropathy     Other   Major depressive disorder, recurrent severe without psychotic features (HCC) - Primary   Tobacco use disorder   Methamphetamine abuse ([place])   Cannabis abuse   Cocaine abuse ([place])   Elevated LFTs   Other Visit Diagnoses     Hypokalemia       Hypocalcemia           Assessment and Plan:   Follow Up Instructions:    I discussed the assessment and treatment plan with the patient. The patient was provided an opportunity to ask questions and all were answered. The patient agreed with the plan and demonstrated an understanding of the instructions.   The patient was advised to call back or seek an in-person evaluation if the symptoms worsen or if the condition fails to improve as anticipated.  I provided *** minutes of non-face-to-face time during this encounter.   Wasyl Dornfeld S Mayers, PA-C No follow-ups on file.    Loraine Grip Mayers, PA-C

## 2022-12-29 ENCOUNTER — Encounter: Payer: Self-pay | Admitting: Physician Assistant

## 2023-01-16 ENCOUNTER — Ambulatory Visit (INDEPENDENT_AMBULATORY_CARE_PROVIDER_SITE_OTHER): Payer: Medicaid Other | Admitting: Internal Medicine

## 2023-01-16 ENCOUNTER — Encounter: Payer: Self-pay | Admitting: Internal Medicine

## 2023-01-16 ENCOUNTER — Other Ambulatory Visit: Payer: Self-pay

## 2023-01-16 ENCOUNTER — Other Ambulatory Visit (HOSPITAL_COMMUNITY): Payer: Self-pay

## 2023-01-16 ENCOUNTER — Telehealth: Payer: Self-pay

## 2023-01-16 VITALS — BP 134/90 | HR 69 | Temp 97.3°F | Ht 68.0 in | Wt 189.0 lb

## 2023-01-16 DIAGNOSIS — F1120 Opioid dependence, uncomplicated: Secondary | ICD-10-CM

## 2023-01-16 DIAGNOSIS — B182 Chronic viral hepatitis C: Secondary | ICD-10-CM | POA: Diagnosis present

## 2023-01-16 NOTE — Progress Notes (Addendum)
[facility] for Infectious Disease  Reason for Consult: Hepatitis C  Referring Provider: Carrolyn Meiers, PA   HPI:    Edwin [name] is a 37 y.o. male with PMHx as below who presents to the clinic for hepatitis C.   Patient here today as new hepatitis C patient.  He has history of injection drug use.  He has never had treatment for hep C in the past, but was first diagnosed in 2017.  Labs thus far have shown:  --HIV negative (Feb 2024) --AST 29 (Feb 2024) --HCV RNA 771 (Feb 2023) --Genotype 1a (Feb 2023) --FIB4 is 1.05  He reports no injection use since January.  He spent 30 days at [facility] and now is working on getting into their outpatient program.  He is not on Suboxone or Methadone.    Patient's Medications  New Prescriptions   No medications on file  Previous Medications   GABAPENTIN (NEURONTIN) 100 MG CAPSULE    Take 1 capsule (100 mg total) by mouth 3 (three) times daily.  Modified Medications   No medications on file  Discontinued Medications   No medications on file      Past Medical History:  Diagnosis Date   Drug overdose    DVT (deep venous thrombosis) (HCC)    Hepatitis C     Social History   Tobacco Use   Smoking status: Every Day    Packs/day: 1.00    Years: 20.00    Additional pack years: 0.00    Total pack years: 20.00    Types: Cigarettes   Smokeless tobacco: Never  Substance Use Topics   Alcohol use: Not Currently    Comment: beer and liquor, binges   Drug use: Not Currently    Frequency: 1.0 times per week    Types: Marijuana, IV, Cocaine, Methamphetamines    Comment: heroin    No family history on file.  Allergies  Allergen Reactions   Mucomyst [Acetylcysteine] Other (See Comments)    unknown   Vicodin [Hydrocodone-Acetaminophen] Other (See Comments)    Reaction:  GI upset     Review of Systems  Constitutional: Negative.   Gastrointestinal: Negative.   All other systems reviewed and are  negative.     OBJECTIVE:    Vitals:   01/16/23 0834  BP: (!) 134/90  Pulse: 69  Temp: (!) 97.3 F (36.3 C)  TempSrc: Temporal  Weight: 189 lb (85.7 kg)  Height: 5\' 8"  (1.727 m)     Body mass index is 28.74 kg/m.  Physical Exam Constitutional:      Appearance: Normal appearance.  HENT:     Head: Normocephalic and atraumatic.  Eyes:     General: No scleral icterus.    Extraocular Movements: Extraocular movements intact.     Conjunctiva/sclera: Conjunctivae normal.  Musculoskeletal:        General: Normal range of motion.  Skin:    General: Skin is warm and dry.     Coloration: Skin is not jaundiced.  Neurological:     General: No focal deficit present.     Mental Status: He is alert and oriented to person, place, and time.  Psychiatric:        Mood and Affect: Mood normal.        Behavior: Behavior normal.      Labs and Microbiology:     Latest Ref Rng & Units 01/16/2023    8:57 AM 11/15/2022    4:37 AM 11/13/2022  3:55 AM  CBC  WBC 3.8 - 10.8 Thousand/uL 6.0  9.1  14.0   Hemoglobin 13.2 - 17.1 g/dL 15.9  11.1  12.9   Hematocrit 38.5 - 50.0 % 45.2  32.5  38.2   Platelets 140 - 400 Thousand/uL 328  204  227       Latest Ref Rng & Units 01/16/2023    8:57 AM 12/12/2022    2:26 PM 11/15/2022    4:37 AM  CMP  Glucose 65 - 99 mg/dL 93  118  87   BUN 7 - 25 mg/dL 16  12  9    Creatinine 0.60 - 1.26 mg/dL 0.79  0.87  0.60   Sodium 135 - 146 mmol/L 140  142  136   Potassium 3.5 - 5.3 mmol/L 4.5  4.3  3.3   Chloride 98 - 110 mmol/L 108  107  104   CO2 20 - 32 mmol/L 22   27   Calcium 8.6 - 10.3 mg/dL 9.4  8.9  7.6   Total Protein 6.1 - 8.1 g/dL 7.6  7.0    Total Bilirubin 0.2 - 1.2 mg/dL 0.4  0.3    Alkaline Phos 44 - 121 IU/L  86    AST 10 - 40 U/L 13  29    ALT 9 - 46 U/L 14        No results found for this or any previous visit (from the past 240 hour(s)).     ASSESSMENT & PLAN:    Chronic hepatitis C without hepatic coma ([place])  Patient with  history of hepatitis C and low level viremia when last checked in Feb 2023.  Will obtain repeat hepatitis C screening labs today to determine best course of treatment and follow up in 2 weeks.    Uncomplicated opioid dependence ([place]) Hx of IVDU with sobriety since January 2024.  Completed 30 day inpatient stay at [facility] and now working towards establishing with their outpatient program.   Orders Placed This Encounter  Procedures   US ABDOMEN COMPLETE W/ELASTOGRAPHY    Standing Status:   Future    Number of Occurrences:   1    Standing Expiration Date:   07/19/2023    Order Specific Question:   Reason for Exam (SYMPTOM  OR DIAGNOSIS REQUIRED)    Answer:   hepatitis C    Order Specific Question:   Preferred imaging location?    Answer:   [facility]   Hepatitis B core antibody, total   Hepatitis B surface antibody,qualitative   Hepatitis B surface antigen   CBC   COMPLETE METABOLIC PANEL WITH GFR   Protime-INR   Liver Fibrosis, FibroTest-ActiTest   Hepatitis C RNA quantitative   Hepatitis C genotype   Hepatitis A antibody, total      Raynelle Highland for Infectious Disease [facility] 01/20/2023, 1:22 PM

## 2023-01-16 NOTE — Assessment & Plan Note (Addendum)
  Patient with history of hepatitis C and low level viremia when last checked in Feb 2023.  Will obtain repeat hepatitis C screening labs today to determine best course of treatment and follow up in 2 weeks.

## 2023-01-16 NOTE — Assessment & Plan Note (Signed)
Hx of IVDU with sobriety since January 2024.  Completed 30 day inpatient stay at [facility] and now working towards establishing with their outpatient program.

## 2023-01-16 NOTE — Telephone Encounter (Signed)
[facility] Patient Teacher, English as a foreign language completed.    The patient is insured through [employer] [place] [employer] and has a $4.00 copay.  Medication will need a PA.  We will continue to follow to see if copay assistance is needed.  Ileene Patrick, [facility] Patient [facility] for Infectious Disease Phone: [phone] Fax:  [phone]

## 2023-01-19 ENCOUNTER — Ambulatory Visit
Admission: RE | Admit: 2023-01-19 | Discharge: 2023-01-19 | Disposition: A | Discharge status: Home / Self Care | Payer: Medicaid Other | Source: Ambulatory Visit | Attending: Internal Medicine | Admitting: Internal Medicine

## 2023-01-19 DIAGNOSIS — B182 Chronic viral hepatitis C: Secondary | ICD-10-CM | POA: Diagnosis present

## 2023-01-21 LAB — COMPLETE METABOLIC PANEL WITH GFR
AG Ratio: 1.4 (calc) (ref 1.0–2.5)
ALT: 14 U/L (ref 9–46)
AST: 13 U/L (ref 10–40)
Albumin: 4.4 g/dL (ref 3.6–5.1)
Alkaline phosphatase (APISO): 61 U/L (ref 36–130)
BUN: 16 mg/dL (ref 7–25)
CO2: 22 mmol/L (ref 20–32)
Calcium: 9.4 mg/dL (ref 8.6–10.3)
Chloride: 108 mmol/L (ref 98–110)
Creat: 0.79 mg/dL (ref 0.60–1.26)
Globulin: 3.2 g/dL (calc) (ref 1.9–3.7)
Glucose, Bld: 93 mg/dL (ref 65–99)
Potassium: 4.5 mmol/L (ref 3.5–5.3)
Sodium: 140 mmol/L (ref 135–146)
Total Bilirubin: 0.4 mg/dL (ref 0.2–1.2)
Total Protein: 7.6 g/dL (ref 6.1–8.1)
eGFR: 117 mL/min/{1.73_m2} (ref >=60)

## 2023-01-21 LAB — LIVER FIBROSIS, FIBROTEST-ACTITEST
ALT: 14 U/L (ref 9–46)
Alpha-2-Macroglobulin: 143 mg/dL (ref 106–279)
Apolipoprotein A1: 126 mg/dL (ref 94–176)
Bilirubin: 0.5 mg/dL (ref 0.2–1.2)
Fibrosis Score: 0.1
GGT: 19 U/L (ref 3–90)
Haptoglobin: 134 mg/dL (ref 43–212)
Necroinflammat ACT Score: 0.03
Reference ID: 4833872

## 2023-01-21 LAB — CBC
HCT: 45.2 % (ref 38.5–50.0)
Hemoglobin: 15.9 g/dL (ref 13.2–17.1)
MCH: 30.6 pg (ref 27.0–33.0)
MCHC: 35.2 g/dL (ref 32.0–36.0)
MCV: 86.9 fL (ref 80.0–100.0)
MPV: 10.6 fL (ref 7.5–12.5)
Platelets: 328 10*3/uL (ref 140–400)
RBC: 5.2 10*6/uL (ref 4.20–5.80)
RDW: 13.9 % (ref 11.0–15.0)
WBC: 6 10*3/uL (ref 3.8–10.8)

## 2023-01-21 LAB — HEPATITIS B CORE ANTIBODY, TOTAL: Hep B Core Total Ab: NONREACTIVE

## 2023-01-21 LAB — HEPATITIS C RNA QUANTITATIVE
HCV Quantitative Log: 1.64 log IU/mL — ABNORMAL HIGH
HCV RNA, PCR, QN: 44 IU/mL — ABNORMAL HIGH

## 2023-01-21 LAB — HEPATITIS B SURFACE ANTIGEN: Hepatitis B Surface Ag: NONREACTIVE

## 2023-01-21 LAB — PROTIME-INR
INR: 1.1
Prothrombin Time: 11.1 s (ref 9.0–11.5)

## 2023-01-21 LAB — HEPATITIS A ANTIBODY, TOTAL: Hepatitis A AB,Total: NONREACTIVE

## 2023-01-21 LAB — HEPATITIS B SURFACE ANTIBODY,QUALITATIVE: Hep B S Ab: REACTIVE — AB

## 2023-01-21 LAB — HEPATITIS C GENOTYPE

## 2023-01-30 ENCOUNTER — Telehealth: Payer: Self-pay

## 2023-01-30 NOTE — Telephone Encounter (Signed)
-----   Message from Mignon Pine, DO sent at 01/27/2023 11:51 AM EDT ----- Can you please let patient know that his hepatitis C viral load was only 44 copies.  He may still be clearing the infection on his own and would recommend he come back in 3 months for repeat testing.  His liver numbers are normal and Fibrosis score F0.  Thanks, Mitzi Hansen

## 2023-01-30 NOTE — Telephone Encounter (Signed)
Spoke with patient, relayed that HCV viral load is low at 44 copies and that Dr. Juleen China would like him to follow up in 3 months for repeat testing to see if he has cleared the infection without medication. Relayed that liver labs were normal and Fibrosis score F0. Patient verbalized understanding and has no further questions.   Call transferred to front desk for scheduling.   Beryle Flock, RN

## 2023-02-01 ENCOUNTER — Other Ambulatory Visit: Payer: Self-pay | Admitting: Physician Assistant

## 2023-02-01 DIAGNOSIS — G629 Polyneuropathy, unspecified: Secondary | ICD-10-CM

## 2023-02-02 NOTE — Telephone Encounter (Signed)
Unable to refill per protocol, Rx request is too soon. Last refill 12/28/22 for 90 days. No PCP on file.  Requested Prescriptions  Pending Prescriptions Disp Refills   gabapentin (NEURONTIN) 100 MG capsule [Pharmacy Med Name: Gabapentin 100 MG Oral Capsule] 90 capsule 0    Sig: TAKE 1 CAPSULE BY MOUTH THREE TIMES DAILY     There is no refill protocol information for this order

## 2023-02-08 ENCOUNTER — Ambulatory Visit: Payer: Medicaid Other | Admitting: Internal Medicine

## 2023-05-01 ENCOUNTER — Ambulatory Visit: Payer: MEDICAID | Admitting: Internal Medicine

## 2023-05-01 NOTE — Progress Notes (Deleted)
Patient: Edwin [name]  DOB: 1986/03/10 MRN: [id number] PCP: Pcp, No  Referring Provider: ***  No chief complaint on file.    Patient Active Problem List   Diagnosis Date Noted   Neuropathy 12/13/2022   Opioid abuse (HCC) 12/13/2022   Drug overdose 11/12/2022   Chronic hepatitis C without hepatic coma (HCC)    Acute hepatitis    Acute renal failure (HCC)    Elevated LFTs    Hypermagnesemia    Infection    Cocaine poisoning (HCC) 12/02/2021   Rhabdomyolysis 12/02/2021   ATN (acute tubular necrosis) (HCC) 12/02/2021   Hyperkalemia 12/02/2021   Myositis 12/02/2021   Acute metabolic encephalopathy 12/01/2021   Non-ST elevation (NSTEMI) myocardial infarction [facility])    Cocaine abuse (HCC)    Suicidal thoughts    Chest pain    Cannabis abuse    Major depressive disorder, recurrent episode, severe (HCC) 08/11/2018   Methamphetamine abuse (HCC)    Cannabis use disorder, moderate, dependence (HCC)    Tobacco use disorder 01/28/2016   Major depressive disorder, recurrent severe without psychotic features (HCC)    Uncomplicated opioid dependence (HCC)    Opiate or related narcotic overdose (HCC) 11/28/2015   Polysubstance abuse (HCC)      Subjective:  Edwin [name] is a 37 y.o. @GENDER @ with   ROS  Past Medical History:  Diagnosis Date   Drug overdose    DVT (deep venous thrombosis) (HCC)    Hepatitis C     Outpatient Medications Prior to Visit  Medication Sig Dispense Refill   gabapentin (NEURONTIN) 100 MG capsule Take 1 capsule (100 mg total) by mouth 3 (three) times daily. 90 capsule 0   No facility-administered medications prior to visit.     Allergies  Allergen Reactions   Mucomyst [Acetylcysteine] Other (See Comments)    unknown   Vicodin [Hydrocodone-Acetaminophen] Other (See Comments)    Reaction:  GI upset     Social History   Tobacco Use   Smoking status: Every Day    Packs/day: 1.00    Years: 20.00    Additional pack  years: 0.00    Total pack years: 20.00    Types: Cigarettes   Smokeless tobacco: Never  Substance Use Topics   Alcohol use: Not Currently    Comment: beer and liquor, binges   Drug use: Not Currently    Frequency: 1.0 times per week    Types: Marijuana, IV, Cocaine, Methamphetamines    Comment: heroin    No family history on file.  Objective:  There were no vitals filed for this visit. There is no height or weight on file to calculate BMI.  Physical Exam  Lab Results: Lab Results  Component Value Date   WBC 6.0 01/16/2023   HGB 15.9 01/16/2023   HCT 45.2 01/16/2023   MCV 86.9 01/16/2023   PLT 328 01/16/2023    Lab Results  Component Value Date   CREATININE 0.79 01/16/2023   BUN 16 01/16/2023   NA 140 01/16/2023   K 4.5 01/16/2023   CL 108 01/16/2023   CO2 22 01/16/2023    Lab Results  Component Value Date   ALT 14 01/16/2023   ALT 14 01/16/2023   AST 13 01/16/2023   GGT 19 01/16/2023   ALKPHOS 86 12/12/2022   BILITOT 0.4 01/16/2023     Assessment & Plan:   Problem List Items Addressed This Visit   None   *** will return to clinic in *** {  weeks/months} for follow up  Danelle Earthly, MD [facility]   05/01/23  5:40 AM
